# Patient Record
Sex: Female | Born: 1955
Health system: Southern US, Community
[De-identification: ages and names within clinical notes are randomized; demographics above are authoritative.]

## PROBLEM LIST (undated history)

## (undated) DIAGNOSIS — I10 Essential (primary) hypertension: Secondary | ICD-10-CM

## (undated) DIAGNOSIS — L309 Dermatitis, unspecified: Secondary | ICD-10-CM

## (undated) DIAGNOSIS — J45909 Unspecified asthma, uncomplicated: Secondary | ICD-10-CM

## (undated) HISTORY — DX: Unspecified asthma, uncomplicated: J45.909

## (undated) HISTORY — DX: Essential (primary) hypertension: I10

---

## 1997-07-10 ENCOUNTER — Emergency Department (HOSPITAL_COMMUNITY): Admission: EM | Admit: 1997-07-10 | Discharge: 1997-07-10 | Payer: Self-pay | Admitting: Emergency Medicine

## 1998-03-29 ENCOUNTER — Emergency Department (HOSPITAL_COMMUNITY): Admission: EM | Admit: 1998-03-29 | Discharge: 1998-03-29 | Payer: Self-pay | Admitting: Emergency Medicine

## 1999-12-08 ENCOUNTER — Emergency Department (HOSPITAL_COMMUNITY): Admission: EM | Admit: 1999-12-08 | Discharge: 1999-12-09 | Payer: Self-pay | Admitting: Emergency Medicine

## 1999-12-08 ENCOUNTER — Encounter: Payer: Self-pay | Admitting: Emergency Medicine

## 2000-07-06 ENCOUNTER — Emergency Department (HOSPITAL_COMMUNITY): Admission: EM | Admit: 2000-07-06 | Discharge: 2000-07-06 | Payer: Self-pay | Admitting: Emergency Medicine

## 2000-07-06 ENCOUNTER — Encounter: Payer: Self-pay | Admitting: Emergency Medicine

## 2000-08-14 ENCOUNTER — Encounter: Payer: Self-pay | Admitting: Emergency Medicine

## 2000-08-14 ENCOUNTER — Emergency Department (HOSPITAL_COMMUNITY): Admission: EM | Admit: 2000-08-14 | Discharge: 2000-08-14 | Payer: Self-pay | Admitting: Emergency Medicine

## 2001-03-28 ENCOUNTER — Emergency Department (HOSPITAL_COMMUNITY): Admission: EM | Admit: 2001-03-28 | Discharge: 2001-03-28 | Payer: Self-pay | Admitting: Emergency Medicine

## 2001-03-28 ENCOUNTER — Encounter: Payer: Self-pay | Admitting: Emergency Medicine

## 2001-05-22 ENCOUNTER — Encounter: Payer: Self-pay | Admitting: Emergency Medicine

## 2001-05-22 ENCOUNTER — Encounter: Payer: Self-pay | Admitting: Orthopedic Surgery

## 2001-05-22 ENCOUNTER — Emergency Department (HOSPITAL_COMMUNITY): Admission: EM | Admit: 2001-05-22 | Discharge: 2001-05-22 | Payer: Self-pay | Admitting: Emergency Medicine

## 2001-06-13 ENCOUNTER — Emergency Department (HOSPITAL_COMMUNITY): Admission: EM | Admit: 2001-06-13 | Discharge: 2001-06-13 | Payer: Self-pay

## 2001-07-28 ENCOUNTER — Emergency Department (HOSPITAL_COMMUNITY): Admission: EM | Admit: 2001-07-28 | Discharge: 2001-07-28 | Payer: Self-pay | Admitting: Emergency Medicine

## 2002-12-19 ENCOUNTER — Emergency Department (HOSPITAL_COMMUNITY): Admission: EM | Admit: 2002-12-19 | Discharge: 2002-12-19 | Payer: Self-pay | Admitting: Emergency Medicine

## 2003-02-04 ENCOUNTER — Emergency Department (HOSPITAL_COMMUNITY): Admission: EM | Admit: 2003-02-04 | Discharge: 2003-02-04 | Payer: Self-pay | Admitting: Emergency Medicine

## 2003-04-11 ENCOUNTER — Emergency Department (HOSPITAL_COMMUNITY): Admission: EM | Admit: 2003-04-11 | Discharge: 2003-04-11 | Payer: Self-pay

## 2003-05-29 ENCOUNTER — Emergency Department (HOSPITAL_COMMUNITY): Admission: EM | Admit: 2003-05-29 | Discharge: 2003-05-29 | Payer: Self-pay | Admitting: Emergency Medicine

## 2003-10-29 ENCOUNTER — Emergency Department (HOSPITAL_COMMUNITY): Admission: EM | Admit: 2003-10-29 | Discharge: 2003-10-29 | Payer: Self-pay | Admitting: Emergency Medicine

## 2003-12-08 ENCOUNTER — Emergency Department (HOSPITAL_COMMUNITY): Admission: EM | Admit: 2003-12-08 | Discharge: 2003-12-08 | Payer: Self-pay | Admitting: Emergency Medicine

## 2003-12-10 ENCOUNTER — Emergency Department (HOSPITAL_COMMUNITY): Admission: EM | Admit: 2003-12-10 | Discharge: 2003-12-10 | Payer: Self-pay | Admitting: Emergency Medicine

## 2004-06-07 ENCOUNTER — Emergency Department (HOSPITAL_COMMUNITY): Admission: EM | Admit: 2004-06-07 | Discharge: 2004-06-07 | Payer: Self-pay | Admitting: Emergency Medicine

## 2004-12-15 ENCOUNTER — Emergency Department (HOSPITAL_COMMUNITY): Admission: EM | Admit: 2004-12-15 | Discharge: 2004-12-15 | Payer: Self-pay | Admitting: Emergency Medicine

## 2005-04-26 ENCOUNTER — Emergency Department (HOSPITAL_COMMUNITY): Admission: EM | Admit: 2005-04-26 | Discharge: 2005-04-26 | Payer: Self-pay | Admitting: Emergency Medicine

## 2007-02-21 ENCOUNTER — Emergency Department (HOSPITAL_COMMUNITY): Admission: EM | Admit: 2007-02-21 | Discharge: 2007-02-21 | Payer: Self-pay | Admitting: Emergency Medicine

## 2008-11-17 ENCOUNTER — Emergency Department (HOSPITAL_COMMUNITY): Admission: EM | Admit: 2008-11-17 | Discharge: 2008-11-17 | Payer: Self-pay | Admitting: Emergency Medicine

## 2008-11-29 ENCOUNTER — Inpatient Hospital Stay (HOSPITAL_COMMUNITY): Admission: EM | Admit: 2008-11-29 | Discharge: 2008-12-07 | Payer: Self-pay | Admitting: Emergency Medicine

## 2008-11-30 ENCOUNTER — Ambulatory Visit: Payer: Self-pay | Admitting: Surgery

## 2008-11-30 ENCOUNTER — Encounter (INDEPENDENT_AMBULATORY_CARE_PROVIDER_SITE_OTHER): Payer: Self-pay | Admitting: Internal Medicine

## 2009-01-10 ENCOUNTER — Ambulatory Visit: Payer: Self-pay | Admitting: Internal Medicine

## 2009-01-10 DIAGNOSIS — Z87898 Personal history of other specified conditions: Secondary | ICD-10-CM

## 2009-01-10 DIAGNOSIS — L209 Atopic dermatitis, unspecified: Secondary | ICD-10-CM

## 2009-01-10 DIAGNOSIS — L03119 Cellulitis of unspecified part of limb: Secondary | ICD-10-CM

## 2009-01-10 DIAGNOSIS — J45909 Unspecified asthma, uncomplicated: Secondary | ICD-10-CM | POA: Insufficient documentation

## 2009-01-10 DIAGNOSIS — I10 Essential (primary) hypertension: Secondary | ICD-10-CM

## 2009-01-10 DIAGNOSIS — L02419 Cutaneous abscess of limb, unspecified: Secondary | ICD-10-CM | POA: Insufficient documentation

## 2009-01-31 ENCOUNTER — Ambulatory Visit: Payer: Self-pay | Admitting: Internal Medicine

## 2009-02-28 ENCOUNTER — Ambulatory Visit: Payer: Self-pay | Admitting: Internal Medicine

## 2009-03-01 ENCOUNTER — Telehealth (INDEPENDENT_AMBULATORY_CARE_PROVIDER_SITE_OTHER): Payer: Self-pay | Admitting: Internal Medicine

## 2009-06-28 ENCOUNTER — Ambulatory Visit: Payer: Self-pay | Admitting: Internal Medicine

## 2009-06-28 DIAGNOSIS — J309 Allergic rhinitis, unspecified: Secondary | ICD-10-CM | POA: Insufficient documentation

## 2009-06-28 DIAGNOSIS — J4489 Other specified chronic obstructive pulmonary disease: Secondary | ICD-10-CM | POA: Insufficient documentation

## 2009-06-28 DIAGNOSIS — J449 Chronic obstructive pulmonary disease, unspecified: Secondary | ICD-10-CM

## 2009-06-28 DIAGNOSIS — F17200 Nicotine dependence, unspecified, uncomplicated: Secondary | ICD-10-CM

## 2009-06-28 LAB — CONVERTED CEMR LAB
Chlamydia, DNA Probe: NEGATIVE
GC Probe Amp, Genital: NEGATIVE
Glucose, Urine, Semiquant: NEGATIVE
KOH Prep: NEGATIVE
Nitrite: POSITIVE
pH: 5

## 2009-06-29 ENCOUNTER — Telehealth (INDEPENDENT_AMBULATORY_CARE_PROVIDER_SITE_OTHER): Payer: Self-pay | Admitting: Internal Medicine

## 2009-07-02 ENCOUNTER — Telehealth (INDEPENDENT_AMBULATORY_CARE_PROVIDER_SITE_OTHER): Payer: Self-pay | Admitting: Internal Medicine

## 2009-07-05 ENCOUNTER — Encounter (INDEPENDENT_AMBULATORY_CARE_PROVIDER_SITE_OTHER): Payer: Self-pay | Admitting: Internal Medicine

## 2009-07-08 ENCOUNTER — Ambulatory Visit (HOSPITAL_COMMUNITY): Admission: RE | Admit: 2009-07-08 | Discharge: 2009-07-08 | Payer: Self-pay | Admitting: Internal Medicine

## 2009-07-16 ENCOUNTER — Ambulatory Visit: Payer: Self-pay | Admitting: Internal Medicine

## 2009-07-16 LAB — CONVERTED CEMR LAB
ALT: 19 units/L (ref 0–35)
Albumin: 4.3 g/dL (ref 3.5–5.2)
Alkaline Phosphatase: 55 units/L (ref 39–117)
BUN: 13 mg/dL (ref 6–23)
Basophils Absolute: 0 10*3/uL (ref 0.0–0.1)
Basophils Relative: 0 % (ref 0–1)
Chloride: 101 meq/L (ref 96–112)
Creatinine, Ser: 0.65 mg/dL (ref 0.40–1.20)
Eosinophils Absolute: 0.1 10*3/uL (ref 0.0–0.7)
Eosinophils Relative: 2 % (ref 0–5)
HCT: 41.2 % (ref 36.0–46.0)
Hemoglobin: 14 g/dL (ref 12.0–15.0)
LDL Cholesterol: 96 mg/dL (ref 0–99)
Lymphocytes Relative: 36 % (ref 12–46)
Lymphs Abs: 1.8 10*3/uL (ref 0.7–4.0)
Monocytes Absolute: 0.9 10*3/uL (ref 0.1–1.0)
Monocytes Relative: 17 % — ABNORMAL HIGH (ref 3–12)
Neutro Abs: 2.3 10*3/uL (ref 1.7–7.7)
Platelets: 223 10*3/uL (ref 150–400)
RBC: 4.24 M/uL (ref 3.87–5.11)
RDW: 15.4 % (ref 11.5–15.5)
Total CHOL/HDL Ratio: 2.1
WBC: 5.1 10*3/uL (ref 4.0–10.5)

## 2009-07-24 ENCOUNTER — Encounter (INDEPENDENT_AMBULATORY_CARE_PROVIDER_SITE_OTHER): Payer: Self-pay | Admitting: Internal Medicine

## 2010-03-04 NOTE — Letter (Signed)
Summary: Lipid Letter  [facility]  [address] [place], [place] [address]   Phone: [phone]  Fax: [phone]    07/24/2009  Marie Woods [address] [place], [place]  [address]  Dear [name]:  We have carefully reviewed your last lipid profile from 07/16/2009 and the results are noted below with a summary of recommendations for lipid management.    Cholesterol:       205     Goal: <200   HDL "good" Cholesterol:   96     Goal: >45   LDL "bad" Cholesterol:   96     Goal: <100   Triglycerides:       65     Goal: <150    Your cholesterol is excellent--the total is a bit over 200, but this is because your good cholesterol is so incredibly high.  Rest of labs were fine as well.    TLC Diet (Therapeutic Lifestyle Change): Saturated Fats & Transfatty acids should be kept < 7% of total calories ***Reduce Saturated Fats Polyunstaurated Fat can be up to 10% of total calories Monounsaturated Fat Fat can be up to 20% of total calories Total Fat should be no greater than 25-35% of total calories Carbohydrates should be 50-60% of total calories Protein should be approximately 15% of total calories Fiber should be at least 20-30 grams a day ***Increased fiber may help lower LDL Total Cholesterol should be < 200mg /day Consider adding plant stanol/sterols to diet (example: Benacol spread) ***A higher intake of unsaturated fat may reduce Triglycerides and Increase HDL    Adjunctive Measures (may lower LIPIDS and reduce risk of Heart Attack) include: Aerobic Exercise (20-30 minutes 3-4 times a week) Limit Alcohol Consumption Weight Reduction Aspirin 75-81 mg a day by mouth (if not allergic or contraindicated) Dietary Fiber 20-30 grams a day by mouth     Current Medications: 1)    Ventolin Hfa 108 (90 Base) Mcg/act Aers (Albuterol sulfate) .... 2 puffs 2-3 times a day 2)    Amlodipine Besylate 10 Mg Tabs (Amlodipine besylate) .[name].. 1 tab by mouth daily 3)    Clobetasol  Propionate 0.05 % Crea (Clobetasol propionate) .... Apply to patches two times a day as needed 4)    Xyzal 5 Mg Tabs (Levocetirizine dihydrochloride) .[name].. 1 tab by mouth daily as needed allergies 5)    Nasacort Aq 55 Mcg/act Aers (Triamcinolone acetonide(nasal)) .... 2 sprays each nostril daily 6)    Advair Diskus 100-50 Mcg/dose Aepb (Fluticasone-salmeterol) .[name].. 1 inhalation two times a day.  brush teeth and tongue after each use 7)    Zoloft 50 Mg Tabs (Sertraline hcl) .... 1/2 tab by mouth daily for 7 days, then 1 tab daily thereafter.  If you have any questions, please call. We appreciate being able to work with you.   Sincerely,    [facility] Julieanne Manson MD

## 2010-03-04 NOTE — Progress Notes (Signed)
Summary: change med  Phone Note From Pharmacy   Summary of Call: Cathrine Muster from [facility] pharmacy called, we can no longer enroll new people into the ICP program.  What would you like to change Ms. Trimmer to? Initial call taken by: Vesta Mixer CMA,  Jul 02, 2009 3:54 PM  Follow-up for Phone Call        Called and spoke with Abelina Bachelor other form of Bupropion/Wellbutrin available  Follow-up by: Julieanne Manson MD,  Jul 02, 2009 6:35 PM  Additional Follow-up for Phone Call Additional follow up Details #1::        Called and spoke with pt.--do not want to start her on Chantix with some of her depressive features.  Has never been on an antidepressant even when suicidal back in 1984. Will fax in Zoloft and gave her the instructions To call and set up an appt. with me when she picks up med --to be seen 4 weeks later. Once depression controlled, consider Chantix Additional Follow-up by: Julieanne Manson MD,  July 03, 2009 9:39 AM    New/Updated Medications: ZOLOFT 50 MG TABS (SERTRALINE HCL) 1/2 tab by mouth daily for 7 days, then 1 tab daily thereafter. Prescriptions: ZOLOFT 50 MG TABS (SERTRALINE HCL) 1/2 tab by mouth daily for 7 days, then 1 tab daily thereafter.  #30 x 2   Entered and Authorized by:   Julieanne Manson MD   Signed by:   Julieanne Manson MD on 07/03/2009   Method used:   Faxed to ...       [facility] - Pharmac (retail)       [address] [place], [place]  [address]       Ph: [phone]       Fax: [phone]   RxID:   [id number]

## 2010-03-04 NOTE — Progress Notes (Signed)
Summary: Refills  Phone Note Call from Patient Call back at [facility] Phone [phone] Call back at [phone]   Summary of Call: According to our records, shows that the pt has refills from amilodipine but when she called to [facility] they said that she doesn't has more refills.  Pt is wondering if someone can call at there and she also needs refills from ventolin. Pt blood pressure is high and needs refills.  Please call her back.   Maycee Blasco MD Initial call taken by: Manon Hilding,  March 01, 2009 9:06 AM  Follow-up for Phone Call        Called pharmacy to verify pt's 11 refills on Norvasc.  Pt aware.  Pt is not having any sob right now, just wants to have one on hand just in case of an asthma attack. Follow-up by: Vesta Mixer CMA,  March 04, 2009 9:10 AM  Additional Follow-up for Phone Call Additional follow up Details #1::        Notify pt. Rx sent Additional Follow-up by: Julieanne Manson MD,  March 04, 2009 2:09 PM    Prescriptions: VENTOLIN HFA 108 (90 BASE) MCG/ACT AERS (ALBUTEROL SULFATE) 2 puffs 2-3 times a day  #1 x 0   Entered and Authorized by:   Julieanne Manson MD   Signed by:   Julieanne Manson MD on 03/04/2009   Method used:   Faxed to ...       [facility] - Pharmac (retail)       [address] [place], [place]  [address]       Ph: [phone]       Fax: [phone]   RxID:   [id number]

## 2010-03-04 NOTE — Progress Notes (Signed)
Summary: Office Visit//DEPRESSION SCREENING  Office Visit//DEPRESSION SCREENING   Imported By: Arta Bruce 08/12/2009 14:36:00  _____________________________________________________________________  External Attachment:    Type:   Image     Comment:   External Document

## 2010-03-04 NOTE — Letter (Signed)
Summary: PT INFORMATION SHEET  PT INFORMATION SHEET   Imported By: Arta Bruce 02/25/2009 09:48:06  _____________________________________________________________________  External Attachment:    Type:   Image     Comment:   External Document

## 2010-03-04 NOTE — Letter (Signed)
Summary: *HSN Results Follow up  [facility]  [address] [place], [place] [address]   Phone: [phone]  Fax: [phone]      07/05/2009   Marie Woods [address] [place], [place]  [address]   Dear  Ms. Logan Valcarcel,                            ____S.Drinkard,FNP   ____D. Gore,FNP       ____B. McPherson,MD   ____V. Rankins,MD    __X__E. Karyna Bessler,MD    ____N. Daphine Deutscher, FNP  ____D. Reche Dixon, MD    ____K. Philipp Deputy, MD    ____Other     This letter is to inform you that your recent test(s):  ___X____Pap Smear    _______Lab Test     _______X-ray    ____X___ is within acceptable limits  _______ requires a medication change  _______ requires a follow-up lab visit  _______ requires a follow-up visit with your provider   Comments:       _________________________________________________________ If you have any questions, please contact our office                     Sincerely,  Julieanne Manson MD [facility]

## 2010-03-04 NOTE — Assessment & Plan Note (Signed)
Summary: 6 WEEK F/U ECZEMA///BC   Vital Signs:  Patient profile:   55 year old female Weight:      187.1 pounds Temp:     96.2 degrees F oral Pulse rate:   84 / minute Pulse rhythm:   regular Resp:     18 per minute BP sitting:   136 / 83  (left arm) Cuff size:   regular  Vitals Entered By: Geanie Cooley (February 28, 2009 4:09 PM) CC: pt here for a followup on her Eczema Is Patient Diabetic? No Pain Assessment Patient in pain? no       Does patient need assistance? Functional Status Self care Ambulation Normal   CC:  pt here for a followup on her Eczema.  History of Present Illness: 1.  Hypertension:  Pt. never filled the 10 mg Amlodipine tabs--out of work and sister died recently.  Is taking the 5 mg tabs once daily.  2.  Eczema:  Really improved.  Eyes remain quite pruritic.  Lids mainly, but eyes also water.  Does intermittently have problems with sneezing.  Allergies (verified): No Known Drug Allergies  Family History: Mother, died 19:  Breast cancer, hypertension Father, died 79:  brain aneurysm rupture, COPD--smoker, hypertension 1 Brother 1/2 Brother 1 Sister:  Lung cancer --smoker--died recently, age 47 3 1/2 Sisters:  one with htn. No children  Physical Exam  General:  NAD Lungs:  Normal respiratory effort, chest expands symmetrically. Lungs are clear to auscultation, no crackles or wheezes. Heart:  Normal rate and regular rhythm. S1 and S2 normal without gallop, murmur, click, rub or other extra sounds.  Radial pulses normal and equal Skin:  Thickened, hyperpigmented skin folds of eyelids with mild flaking Minimal lichenification left about umbilicus as well as right lower leg eczema. Two one cm areas of flaking and thickening on left low back.   Impression & Recommendations:  Problem # 1:  ESSENTIAL HYPERTENSION (ICD-401.9) To increase Amlodipine as discussed at last nurse visit--pt. feels she can do this shortly. Her updated  medication list for this problem includes:    Amlodipine Besylate 10 Mg Tabs (Amlodipine besylate) .[name]... 1 tab by mouth daily  Problem # 2:  ECZEMA (ICD-692.9) Much improved Her updated medication list for this problem includes:    Clobetasol Propionate 0.05 % Crea (Clobetasol propionate) .[name]... Apply to patches two times a day as needed    Fexofenadine Hcl 180 Mg Tabs (Fexofenadine hcl) .[name]... 1 tab by mouth daily  Complete Medication List: 1)  Ventolin Hfa 108 (90 Base) Mcg/act Aers (Albuterol sulfate) .... 2 puffs 2-3 times a day 2)  Amlodipine Besylate 10 Mg Tabs (Amlodipine besylate) .[name].. 1 tab by mouth daily 3)  Clobetasol Propionate 0.05 % Crea (Clobetasol propionate) .... Apply to patches two times a day as needed 4)  Fexofenadine Hcl 180 Mg Tabs (Fexofenadine hcl) .[name].. 1 tab by mouth daily  Patient Instructions: 1)  Nurse visit for bp check in 2 weeks. 2)  CPP with Dr. Delrae Alfred in next 4 months 3)  Use Dove soap, not Dial. Prescriptions: FEXOFENADINE HCL 180 MG TABS (FEXOFENADINE HCL) 1 tab by mouth daily  #30 x 11   Entered and Authorized by:   Julieanne Manson MD   Signed by:   Julieanne Manson MD on 02/28/2009   Method used:   Faxed to ...       [facility] - Pharmac (retail)       [address]  [place], [place]  [address]       Ph: [phone]       Fax: [phone]   RxID:   [id number] CLOBETASOL PROPIONATE 0.05 % CREA (CLOBETASOL PROPIONATE) apply to patches two times a day as needed  #60g x 4   Entered and Authorized by:   Julieanne Manson MD   Signed by:   Julieanne Manson MD on 02/28/2009   Method used:   Faxed to ...       [facility] - Pharmac (retail)       [address] [place], [place]  [address]       Ph: [phone]       Fax: [phone]   RxID:   [id number]    Vital Signs:  Patient profile:   55 year old female Weight:      187.1 pounds Temp:     96.2  degrees F oral Pulse rate:   84 / minute Pulse rhythm:   regular Resp:     18 per minute BP sitting:   136 / 83  (left arm) Cuff size:   regular  Vitals Entered By: Geanie Cooley (February 28, 2009 4:09 PM)   Serial Vital Signs/Assessments:  Time      Position  BP       Pulse  Resp  Temp     By                     170/108                        Julieanne Manson MD

## 2010-03-04 NOTE — Assessment & Plan Note (Signed)
Summary: 4 MONTH FU FOR CPP/////KT   Vital Signs:  Patient profile:   55 year old female Height:      66.25 inches Weight:      187 pounds BMI:     30.06 Temp:     97.8 degrees F oral Pulse rate:   81 / minute Pulse rhythm:   regular Resp:     18 per minute BP sitting:   112 / 76  (left arm) Cuff size:   regular  Vitals Entered By: Arm[place] Shannon (Jun 28, 2009 4:00 PM) CC: CPP Is Patient Diabetic? No Pain Assessment Patient in pain? no       Does patient need assistance? Functional Status Self care Ambulation Normal   CC:  CPP.  History of Present Illness: 55 yo female here for CPP.  Current Medications (verified): 1)  Ventolin Hfa 108 (90 Base) Mcg/act Aers (Albuterol Sulfate) .... 2 Puffs 2-3 Times A Day 2)  Amlodipine Besylate 10 Mg Tabs (Amlodipine Besylate) .[name].. 1 Tab By Mouth Daily 3)  Clobetasol Propionate 0.05 % Crea (Clobetasol Propionate) .... Apply To Patches Two Times A Day As Needed 4)  Fexofenadine Hcl 180 Mg Tabs (Fexofenadine Hcl) .[name].. 1 Tab By Mouth Daily  Allergies (verified): No Known Drug Allergies  Past History:  Past Medical History: ALCOHOLISM (ICD-303.90) ESSENTIAL HYPERTENSION (ICD-401.9) ECZEMA (ICD-692.9) CELLULITIS, LEG, RIGHT (ICD-682.6) ASTHMA, UNSPECIFIED, UNSPECIFIED STATUS (ICD-493.90)  Past Surgical History: None  Family History: Mother, died 15:  Breast cancer, hypertension Father, died 62:  brain aneurysm rupture, COPD--smoker, hypertension 1 Brother:  Healthy 1/2 Brother:  Healthy 1 Sister:  Lung cancer --smoker--died recently, age 19 3 1/2 Sisters:  one with htn.  otherwise healthy No children  Social History: Lives alone in an apt. in [place]. Never married. Works as a Science writer for [employer]   Siblings live in area. Alcohol:  Couple of beers 3 times weekly after work Tobacco:  started as a teenager.  Smokes 1/2 ppd Drugs:  Hx of MJ use--years ago.  Review of Systems General:  Energy generally  good.. Eyes:  Vision is 20/80 per pt--needs glasses.  Does not use reading glasses.  Has not tried [employer] for vision.. ENT:  Denies decreased hearing. CV:  Denies chest pain or discomfort and palpitations. Resp:  SOB only with asthma attack--last was 3-4 months ago.  Went to [employer].. GI:  Denies bloody stools, constipation, dark tarry stools, and diarrhea. GU:  Denies discharge, dysuria, and urinary frequency. MS:  Denies joint pain, joint redness, and joint swelling. Derm:  Denies lesion(s) and rash. Neuro:  Denies numbness, tingling, and weakness. Psych:  Has had suicidal thoughts in past--1984.  Enjoys some parts of life.  Scored a 6 on PQH 9.  Stressed with job at times..  Physical Exam  General:  Obese, congested cough and sniffling thoughout interview. Head:  Normocephalic and atraumatic without obvious abnormalities. No apparent alopecia or balding. Eyes:  No corneal or conjunctival inflammation noted. EOMI. Perrla. Funduscopic exam benign, without hemorrhages, exudates or papilledema. Vision grossly normal. Ears:  External ear exam shows no significant lesions or deformities.  Otoscopic examination reveals clear canals, tympanic membranes are intact bilaterally without bulging, retraction, inflammation or discharge. Hearing is grossly normal bilaterally. Nose:  Mucosal edema and clear discharge. Mouth:  poor dentition and teeth missing.  Throat without injection or exudates Neck:  No deformities, masses, or tenderness noted. Chest Wall:  No deformities, masses, or tenderness noted. Breasts:  No mass, nodules, thickening,  tenderness, bulging, retraction, inflamation, nipple discharge or skin changes noted.   Lungs:  Decreased BS throughout, though no crackles or wheeze. Heart:  Normal rate and regular rhythm. S1 and S2 normal without gallop, murmur, click, rub or other extra sounds. Abdomen:  Bowel sounds positive,abdomen soft and non-tender without masses, organomegaly or  hernias noted. Rectal:  No external abnormalities noted. Normal sphincter tone. No rectal masses or tenderness.  Heme negative light brown stool. Genitalia:  Pelvic Exam:        External: normal female genitalia without lesions or masses        Vagina: normal without lesions or masses        Cervix: normal without lesions or masses        Adnexa: normal bimanual exam without masses or fullness        Uterus: normal by palpation        Pap smear: performed Msk:  No deformity or scoliosis noted of thoracic or lumbar spine.   Pulses:  R and L carotid,radial,femoral,dorsalis pedis and posterior tibial pulses are full and equal bilaterally Extremities:  No clubbing, cyanosis, edema, or deformity noted with normal full range of motion of all joints.   Neurologic:  No cranial nerve deficits noted. Station and gait are normal. Plantar reflexes are down-going bilaterally. DTRs are symmetrical throughout. Sensory, motor and coordinative functions appear intact. Skin:  Intact without suspicious lesions or rashes Cervical Nodes:  No lymphadenopathy noted Axillary Nodes:  No palpable lymphadenopathy Inguinal Nodes:  No significant adenopathy Psych:  Cognition and judgment appear intact. Alert and cooperative with normal attention span and concentration. No apparent delusions, illusions, hallucinations   Impression & Recommendations:  Problem # 1:  ROUTINE GYNECOLOGICAL EXAMINATION (ICD-V72.31) Mammogram scheduled Orders: KOH/ WET Mount [phone]) UA Dipstick w/o Micro (manual) (23762) Pap Smear, Thin Prep ( Collection of) (Q0091) T- GC Chlamydia (83[address])  Problem # 2:  HEALTH SCREENING (ICD-V70.0)  Discuss Pneumococcal vaccination at follow up Hemoccult cards x3 to return in 2 weeks.  Orders: Gastroenterology Referral (GI)--screening colonoscopy  Problem # 3:  COPD (ICD-496)  Her updated medication list for this problem includes:    Ventolin Hfa 108 (90 Base) Mcg/act Aers (Albuterol sulfate)  .[name]... 2 puffs 2-3 times a day    Advair Diskus 100-50 Mcg/dose Aepb (Fluticasone-salmeterol) .[name]... 1 inhalation two times a day.  brush teeth and tongue after each use  Problem # 4:  ALLERGIC RHINITIS (ICD-477.9) Switch to covered Xyzal. Add nasal corticosteroids The following medications were removed from the medication list:    Fexofenadine Hcl 180 Mg Tabs (Fexofenadine hcl) .[name]... 1 tab by mouth daily Her updated medication list for this problem includes:    Xyzal 5 Mg Tabs (Levocetirizine dihydrochloride) .[name]... 1 tab by mouth daily as needed allergies    Nasacort Aq 55 Mcg/act Aers (Triamcinolone acetonide(nasal)) .[name]... 2 sprays each nostril daily  Problem # 5:  TOBACCO ABUSE (ICD-305.1) Start Wellbutrin--feel she has depression this may aid as well.  Complete Medication List: 1)  Ventolin Hfa 108 (90 Base) Mcg/act Aers (Albuterol sulfate) .... 2 puffs 2-3 times a day 2)  Amlodipine Besylate 10 Mg Tabs (Amlodipine besylate) .[name].. 1 tab by mouth daily 3)  Clobetasol Propionate 0.05 % Crea (Clobetasol propionate) .... Apply to patches two times a day as needed 4)  Xyzal 5 Mg Tabs (Levocetirizine dihydrochloride) .[name].. 1 tab by mouth daily as needed allergies 5)  Nasacort Aq 55 Mcg/act Aers (Triamcinolone acetonide(nasal)) .... 2 sprays each nostril daily 6)  Advair Diskus 100-50 Mcg/dose Aepb (Fluticasone-salmeterol) .[name].. 1 inhalation two times a day.  brush teeth and tongue after each use 7)  Wellbutrin Sr 150 Mg Xr12h-tab (Bupropion hcl) .[name].. 1 tab by mouth in a.m. daily for 3 days, then 1 tab by mouth two times a day  Patient Instructions: 1)  Fasting labs in next 2 weeks:  CMET, CBC, FLP, HIV, RPR--bring in stool cards as well then. 2)  Stop smoking on Day 8 to 14 of taking Wellbutrin 3)  Make a follow up appt. with Dr. Delrae Alfred in 4 weeks after starting Wellbutrin--can make that appt. when in for labs. Prescriptions: WELLBUTRIN SR 150 MG XR12H-TAB (BUPROPION HCL) 1 tab by mouth in  a.m. daily for 3 days, then 1 tab by mouth two times a day  #60 x 2   Entered and Authorized by:   Julieanne Manson MD   Signed by:   Julieanne Manson MD on 06/29/2009   Method used:   Faxed to ...       [facility] - Pharmac (retail)       [address] [place], [place]  [address]       Ph: [phone]       Fax: [phone]   RxID:   [id number] AMLODIPINE BESYLATE 10 MG TABS (AMLODIPINE BESYLATE) 1 tab by mouth daily  #30 x 11   Entered and Authorized by:   Julieanne Manson MD   Signed by:   Julieanne Manson MD on 06/29/2009   Method used:   Faxed to ...       [facility] - Pharmac (retail)       [address] [place], [place]  [address]       Ph: [phone]       Fax: [phone]   RxID:   [id number] VENTOLIN HFA 108 (90 BASE) MCG/ACT AERS (ALBUTEROL SULFATE) 2 puffs 2-3 times a day  #1 x 0   Entered and Authorized by:   Julieanne Manson MD   Signed by:   Julieanne Manson MD on 06/29/2009   Method used:   Faxed to ...       [facility] - Pharmac (retail)       [address] [place], [place]  [address]       Ph: [phone]       Fax: [phone]   RxID:   [id number] ADVAIR DISKUS 100-50 MCG/DOSE AEPB (FLUTICASONE-SALMETEROL) 1 inhalation two times a day.  Brush teeth and tongue after each use  #1 x 11   Entered and Authorized by:   Julieanne Manson MD   Signed by:   Julieanne Manson MD on 06/29/2009   Method used:   Faxed to ...       [facility] - Pharmac (retail)       [address] [place], [place]  [address]       Ph: [phone]       Fax: [phone]   RxID:   [id number] NASACORT AQ 55 MCG/ACT AERS (TRIAMCINOLONE ACETONIDE(NASAL)) 2 sprays each nostril daily  #1 x 11   Entered and Authorized by:   Julieanne Manson MD   Signed by:   Julieanne Manson MD on 06/29/2009   Method  used:   Faxed to ...       [facility] - Pharmac (retail)       [address] [place], [place]  [address]       Ph: [phone]       Fax: [phone]   RxID:   [id number] XYZAL 5 MG TABS (LEVOCETIRIZINE DIHYDROCHLORIDE) 1 tab by mouth daily as needed allergies  #30 x 11   Entered and Authorized by:   Julieanne Manson MD   Signed by:   Julieanne Manson MD on 06/28/2009   Method used:   Faxed to ...       [facility] - Pharmac (retail)       [address] [place], [place]  [address]       Ph: [phone]       Fax: [phone]   RxID:   [id number]    Preventive Care Screening     SBE:  Once weekly--no changes Mammogram:  never Last Pap: cannot remember--never abnormal. Osteoprevention:  Occasional cheese.  Feels she is lactose intolerant and does not like Lactaid.   Guaiac Cards:  never. Colonoscopy: never.  Laboratory Results   Urine Tests    Routine Urinalysis   Glucose: negative   (Normal Range: Negative) Bilirubin: small   (Normal Range: Negative) Ketone: small (15)   (Normal Range: Negative) Spec. Gravity: >=1.030   (Normal Range: 1.003-1.035) Blood: small   (Normal Range: Negative) pH: 5.0   (Normal Range: 5.0-8.0) Protein: trace   (Normal Range: Negative) Urobilinogen: 0.2   (Normal Range: 0-1) Nitrite: positive   (Normal Range: Negative) Leukocyte Esterace: negative   (Normal Range: Negative)      Wet Mount Source: vaginal WBC/hpf: 1-5 Bacteria/hpf: 2+ Clue cells/hpf: few  Positive whiff Yeast/hpf: none Wet Mount KOH: Negative Trichomonas/hpf: none   Laboratory Results   Urine Tests    Routine Urinalysis   Glucose: negative   (Normal Range: Negative) Bilirubin: small   (Normal Range: Negative) Ketone: small (15)   (Normal Range: Negative) Spec. Gravity: >=1.030   (Normal Range: 1.003-1.035) Blood: small   (Normal Range: Negative) pH: 5.0   (Normal  Range: 5.0-8.0) Protein: trace   (Normal Range: Negative) Urobilinogen: 0.2   (Normal Range: 0-1) Nitrite: positive   (Normal Range: Negative) Leukocyte Esterace: negative   (Normal Range: Negative)      Wet Mount/KOH  Positive whiff

## 2010-03-04 NOTE — Progress Notes (Signed)
  Phone Note Outgoing Call   Call placed by: Julieanne Manson MD,  Jun 29, 2009 12:11 PM Summary of Call: Called pt. and left message to call office on Tuesday--need to speak with her regarding Wellbutrin for smoking cessation. Initial call taken by: Julieanne Manson MD,  Jun 29, 2009 12:12 PM  Follow-up for Phone Call        Called later and gave info regarding Wellbutrin to pt.  Not sure how much she retained as she sounded under the influence of unknown substance.   Follow-up by: Julieanne Manson MD,  Jun 29, 2009 5:29 PM

## 2010-04-03 ENCOUNTER — Encounter (INDEPENDENT_AMBULATORY_CARE_PROVIDER_SITE_OTHER): Payer: Self-pay | Admitting: Internal Medicine

## 2010-04-03 ENCOUNTER — Encounter: Payer: Self-pay | Admitting: Internal Medicine

## 2010-04-03 DIAGNOSIS — F329 Major depressive disorder, single episode, unspecified: Secondary | ICD-10-CM | POA: Insufficient documentation

## 2010-04-03 DIAGNOSIS — J069 Acute upper respiratory infection, unspecified: Secondary | ICD-10-CM | POA: Insufficient documentation

## 2010-04-10 NOTE — Assessment & Plan Note (Addendum)
Summary: Cold x3 days   Vital Signs:  Patient profile:   55 year old female Menstrual status:  postmenopausal Weight:      195.31 pounds BMI:     31.40 O2 Sat:      98 % on Room air Temp:     98.3 degrees F oral Pulse rate:   100 / minute Pulse rhythm:   regular Resp:     18 per minute BP sitting:   130 / 89  (left arm) Cuff size:   regular  Vitals Entered By: Hale Drone CMA (April 03, 2010 12:08 PM)  O2 Flow:  Room air CC: Cold x3 days -- Having a sore throat, cough, and coughing white fleghm. -- Denies fevers, vomiting, and diarrhea.  Is Patient Diabetic? No Pain Assessment Patient in pain? no       Does patient need assistance? Functional Status Self care Ambulation Normal     Menstrual Status postmenopausal Last PAP Result NEGATIVE FOR INTRAEPITHELIAL LESIONS OR MALIGNANCY.   Serial Vital Signs/Assessments:                                PEF    PreRx  PostRx Time      O2 Sat  O2 Type     L/min  L/min  L/min   By 12:09 PM  98  %   Room air                          Hale Drone CMA  Comments: 12:09 PM Peak Flow: 230 -230 - 260  By: Hale Drone CMA    CC:  Cold x3 days -- Having a sore throat, cough, and coughing white fleghm. -- Denies fevers, vomiting, and and diarrhea. .  History of Present Illness: 1.  As above. No dyspnea. No fever. Drinking fluids okay.   No generalized aching. Has tried Dayquil--does help.   Pt. has not been using Advair--using Ventolin once daily  2.  Hypertension:  on and off Norvasc  3.  Depression:  not sure how long tried Zoloft, but did not pick up any of medications until November--all prescribed in May and June and has only filleds the Norvasc one more time since November.  Feeling depressed on and off.  Current Medications (verified): 1)  Ventolin Hfa 108 (90 Base) Mcg/act Aers (Albuterol Sulfate) .... 2 Puffs 2-3 Times A Day 2)  Amlodipine Besylate 10 Mg Tabs (Amlodipine Besylate) .[name].. 1 Tab By Mouth Daily 3)   Clobetasol Propionate 0.05 % Crea (Clobetasol Propionate) .... Apply To Patches Two Times A Day As Needed 4)  Xyzal 5 Mg Tabs (Levocetirizine Dihydrochloride) .[name].. 1 Tab By Mouth Daily As Needed Allergies 5)  Nasacort Aq 55 Mcg/act Aers (Triamcinolone Acetonide(Nasal)) .... 2 Sprays Each Nostril Daily 6)  Advair Diskus 100-50 Mcg/dose Aepb (Fluticasone-Salmeterol) .[name].. 1 Inhalation Two Times A Day.  Brush Teeth and Tongue After Each Use 7)  Zoloft 50 Mg Tabs (Sertraline Hcl) .... 1/2 Tab By Mouth Daily For 7 Days, Then 1 Tab Daily Thereafter.  Allergies (verified): No Known Drug Allergies  Physical Exam  General:  Sounds nasally congested.  Smells of cigarettes Head:  NT over sinuses Eyes:  No injection or discharge Ears:  External ear exam shows no significant lesions or deformities.  Otoscopic examination reveals clear canals, tympanic membranes are intact bilaterally without bulging, retraction, inflammation or discharge. Hearing  is grossly normal bilaterally. Nose:  mucosa swollen and red with clear discharge Mouth:  pharynx pink and moist.  No exudate Neck:  No deformities, masses, or tenderness noted. Lungs:  Normal respiratory effort, chest expands symmetrically. Lungs are clear to auscultation, no crackles or wheezes. Heart:  Normal rate and regular rhythm. S1 and S2 normal without gallop, murmur, click, rub or other extra sounds.  Radial pulses normal and equal   Impression & Recommendations:  Problem # 1:  URI (ICD-465.9) Supportive care--may use Dayquil and Nyquil combination To call if no improvement in 1 week. Her updated medication list for this problem includes:    Loratadine 10 Mg Tabs (Loratadine) .[name]... 1 tab by mouth daily as needed allergies  Problem # 2:  COPD (ICD-496) No exacerbation today--not needing Advair, so will remove from med list The following medications were removed from the medication list:    Advair Diskus 100-50 Mcg/dose Aepb  (Fluticasone-salmeterol) .[name]... 1 inhalation two times a day.  brush teeth and tongue after each use Her updated medication list for this problem includes:    Ventolin Hfa 108 (90 Base) Mcg/act Aers (Albuterol sulfate) .[name]... 2 puffs 2-3 times a day  Orders: Peak Flow Rate (94150) Pulse Oximetry (single measurment) (98119)  Problem # 3:  TOBACCO ABUSE (ICD-305.1) Encouraged stopping.  Problem # 4:  ESSENTIAL HYPERTENSION (ICD-401.9) to fill and take Amlodipine regularly Her updated medication list for this problem includes:    Amlodipine Besylate 10 Mg Tabs (Amlodipine besylate) .[name]... 1 tab by mouth daily  Problem # 5:  DEPRESSION (ICD-311) Start Zoloft again. Take at night if makes drowsy Her updated medication list for this problem includes:    Zoloft 50 Mg Tabs (Sertraline hcl) .[name]... 1/2 tab by mouth daily for 7 days, then 1 tab daily thereafter.  Complete Medication List: 1)  Ventolin Hfa 108 (90 Base) Mcg/act Aers (Albuterol sulfate) .... 2 puffs 2-3 times a day 2)  Amlodipine Besylate 10 Mg Tabs (Amlodipine besylate) .[name].. 1 tab by mouth daily 3)  Clobetasol Propionate 0.05 % Crea (Clobetasol propionate) .... Apply to patches two times a day as needed 4)  Loratadine 10 Mg Tabs (Loratadine) .[name].. 1 tab by mouth daily as needed allergies 5)  Nasacort Aq 55 Mcg/act Aers (Triamcinolone acetonide(nasal)) .... 2 sprays each nostril daily 6)  Zoloft 50 Mg Tabs (Sertraline hcl) .... 1/2 tab by mouth daily for 7 days, then 1 tab daily thereafter.  Patient Instructions: 1)  Take Zoloft at bedtime if makes sleepy 2)  Follow up with Dr. Delrae Alfred in 2 months --depression, htn Prescriptions: ZOLOFT 50 MG TABS (SERTRALINE HCL) 1/2 tab by mouth daily for 7 days, then 1 tab daily thereafter.  #30 x 2   Entered and Authorized by:   Julieanne Manson MD   Signed by:   Julieanne Manson MD on 04/03/2010   Method used:   Faxed to ...       [facility] (retail)        [address] [place], [place]  [address]       Ph: [phone]       Fax: [phone]   RxID:   [id number] NASACORT AQ 55 MCG/ACT AERS (TRIAMCINOLONE ACETONIDE(NASAL)) 2 sprays each nostril daily  #1 x 11   Entered and Authorized by:   Julieanne Manson MD   Signed by:   Julieanne Manson MD on 04/03/2010   Method used:   Faxed to .[name] KitchenMarland Kitchen  [facility] - [facility] (retail)       [address] [place], [place]  [address]       Ph: [phone]       Fax: [phone]   RxID:   [id number] CLOBETASOL PROPIONATE 0.05 % CREA (CLOBETASOL PROPIONATE) apply to patches two times a day as needed  #60g x 4   Entered and Authorized by:   Julieanne Manson MD   Signed by:   Julieanne Manson MD on 04/03/2010   Method used:   Faxed to ...       [facility] - [facility] (retail)       [address] [place], [place]  [address]       Ph: [phone]       Fax: [phone]   RxID:   [id number] AMLODIPINE BESYLATE 10 MG TABS (AMLODIPINE BESYLATE) 1 tab by mouth daily  #30 x 11   Entered and Authorized by:   Julieanne Manson MD   Signed by:   Julieanne Manson MD on 04/03/2010   Method used:   Faxed to ...       [facility] - [facility] (retail)       [address] [place], [place]  [address]       Ph: [phone]       Fax: [phone]   RxID:   [id number] VENTOLIN HFA 108 (90 BASE) MCG/ACT AERS (ALBUTEROL SULFATE) 2 puffs 2-3 times a day  #1 x 0   Entered and Authorized by:   Julieanne Manson MD   Signed by:   Julieanne Manson MD on 04/03/2010   Method used:   Faxed to ...       [facility] - [facility] (retail)       [address] [place], [place]  [address]       Ph: [phone]       Fax: [phone]   RxID:   [id number] LORATADINE 10 MG TABS (LORATADINE) 1 tab by mouth daily as needed allergies   #30 x 11   Entered and Authorized by:   Julieanne Manson MD   Signed by:   Julieanne Manson MD on 04/03/2010   Method used:   Faxed to ...       [facility] - [facility] (retail)       [address] [place], [place]  [address]       Ph: [phone]       Fax: [phone]   RxID:   [id number]    Orders Added: 1)  Peak Flow Rate [94150] 2)  Pulse Oximetry (single measurment) [94760]  Appended Document: Cold x3 days    Clinical Lists Changes  Orders: Added new Service order of Est. Patient Level IV (24580) - Signed

## 2010-05-07 LAB — CBC
HCT: 33.3 % — ABNORMAL LOW (ref 36.0–46.0)
HCT: 35.5 % — ABNORMAL LOW (ref 36.0–46.0)
HCT: 35.9 % — ABNORMAL LOW (ref 36.0–46.0)
Hemoglobin: 10.9 g/dL — ABNORMAL LOW (ref 12.0–15.0)
Hemoglobin: 11 g/dL — ABNORMAL LOW (ref 12.0–15.0)
Hemoglobin: 11.3 g/dL — ABNORMAL LOW (ref 12.0–15.0)
Hemoglobin: 11.9 g/dL — ABNORMAL LOW (ref 12.0–15.0)
MCHC: 33.1 g/dL (ref 30.0–36.0)
MCHC: 33.5 g/dL (ref 30.0–36.0)
MCHC: 33.9 g/dL (ref 30.0–36.0)
Platelets: 186 10*3/uL (ref 150–400)
Platelets: 205 10*3/uL (ref 150–400)
Platelets: 212 10*3/uL (ref 150–400)
RBC: 3.2 MIL/uL — ABNORMAL LOW (ref 3.87–5.11)
RBC: 3.36 MIL/uL — ABNORMAL LOW (ref 3.87–5.11)
RBC: 3.5 MIL/uL — ABNORMAL LOW (ref 3.87–5.11)
RBC: 3.57 MIL/uL — ABNORMAL LOW (ref 3.87–5.11)
RDW: 15 % (ref 11.5–15.5)
RDW: 15.3 % (ref 11.5–15.5)
WBC: 4.4 10*3/uL (ref 4.0–10.5)
WBC: 4.6 10*3/uL (ref 4.0–10.5)
WBC: 5.5 10*3/uL (ref 4.0–10.5)
WBC: 5.6 10*3/uL (ref 4.0–10.5)
WBC: 5.8 10*3/uL (ref 4.0–10.5)

## 2010-05-07 LAB — BASIC METABOLIC PANEL
Chloride: 101 mEq/L (ref 96–112)
Creatinine, Ser: 0.62 mg/dL (ref 0.4–1.2)
Glucose, Bld: 91 mg/dL (ref 70–99)
Potassium: 3.6 mEq/L (ref 3.5–5.1)
Sodium: 135 mEq/L (ref 135–145)

## 2010-05-08 LAB — URINE CULTURE

## 2010-05-08 LAB — BASIC METABOLIC PANEL
BUN: 4 mg/dL — ABNORMAL LOW (ref 6–23)
CO2: 25 mEq/L (ref 19–32)
CO2: 27 mEq/L (ref 19–32)
Chloride: 100 mEq/L (ref 96–112)
Chloride: 103 mEq/L (ref 96–112)
Creatinine, Ser: 0.56 mg/dL (ref 0.4–1.2)
GFR calc Af Amer: >60 mL/min (ref >60)
GFR calc non Af Amer: >60 mL/min (ref >60)
Glucose, Bld: 92 mg/dL (ref 70–99)
Potassium: 2.9 mEq/L — ABNORMAL LOW (ref 3.5–5.1)
Potassium: 3.7 mEq/L (ref 3.5–5.1)
Sodium: 138 mEq/L (ref 135–145)

## 2010-05-08 LAB — MAGNESIUM
Magnesium: 1.8 mg/dL (ref 1.5–2.5)
Magnesium: 1.9 mg/dL (ref 1.5–2.5)

## 2010-05-08 LAB — COMPREHENSIVE METABOLIC PANEL
ALT: 19 U/L (ref 0–35)
Alkaline Phosphatase: 35 U/L — ABNORMAL LOW (ref 39–117)
BUN: 3 mg/dL — ABNORMAL LOW (ref 6–23)
CO2: 28 mEq/L (ref 19–32)
CO2: 29 mEq/L (ref 19–32)
Chloride: 104 mEq/L (ref 96–112)
Creatinine, Ser: 0.58 mg/dL (ref 0.4–1.2)
GFR calc non Af Amer: >60 mL/min (ref >60)
GFR calc non Af Amer: >60 mL/min (ref >60)
Glucose, Bld: 98 mg/dL (ref 70–99)
Potassium: 3.7 mEq/L (ref 3.5–5.1)
Sodium: 135 mEq/L (ref 135–145)
Total Bilirubin: 0.8 mg/dL (ref 0.3–1.2)
Total Protein: 5.3 g/dL — ABNORMAL LOW (ref 6.0–8.3)

## 2010-05-08 LAB — DIFFERENTIAL
Basophils Absolute: 0 10*3/uL (ref 0.0–0.1)
Basophils Relative: 1 % (ref 0–1)
Basophils Relative: 1 % (ref 0–1)
Eosinophils Absolute: 0.2 10*3/uL (ref 0.0–0.7)
Eosinophils Relative: 2 % (ref 0–5)
Lymphocytes Relative: 26 % (ref 12–46)
Lymphs Abs: 2.3 10*3/uL (ref 0.7–4.0)
Lymphs Abs: 3.4 10*3/uL (ref 0.7–4.0)
Monocytes Absolute: 0.8 10*3/uL (ref 0.1–1.0)
Monocytes Relative: 11 % (ref 3–12)
Monocytes Relative: 15 % — ABNORMAL HIGH (ref 3–12)
Neutro Abs: 3.7 10*3/uL (ref 1.7–7.7)
Neutrophils Relative %: 56 % (ref 43–77)

## 2010-05-08 LAB — CULTURE, BLOOD (ROUTINE X 2)

## 2010-05-08 LAB — BLOOD GAS, ARTERIAL
Acid-base deficit: 1 mmol/L (ref 0.0–2.0)
Drawn by: 103701
O2 Saturation: 94.3 %
TCO2: 21.3 mmol/L (ref 0–100)
pCO2 arterial: 41.5 mmHg (ref 35.0–45.0)
pO2, Arterial: 78.7 mmHg — ABNORMAL LOW (ref 80.0–100.0)

## 2010-05-08 LAB — CBC
HCT: 34.9 % — ABNORMAL LOW (ref 36.0–46.0)
HCT: 38 % (ref 36.0–46.0)
Hemoglobin: 11.5 g/dL — ABNORMAL LOW (ref 12.0–15.0)
Hemoglobin: 12.6 g/dL (ref 12.0–15.0)
MCHC: 33.4 g/dL (ref 30.0–36.0)
MCV: 101 fL — ABNORMAL HIGH (ref 78.0–100.0)
MCV: 101.5 fL — ABNORMAL HIGH (ref 78.0–100.0)
MCV: 101.9 fL — ABNORMAL HIGH (ref 78.0–100.0)
MCV: 102.2 fL — ABNORMAL HIGH (ref 78.0–100.0)
Platelets: 177 10*3/uL (ref 150–400)
Platelets: 227 10*3/uL (ref 150–400)
RBC: 3.36 MIL/uL — ABNORMAL LOW (ref 3.87–5.11)
RBC: 3.41 MIL/uL — ABNORMAL LOW (ref 3.87–5.11)
RBC: 3.76 MIL/uL — ABNORMAL LOW (ref 3.87–5.11)
WBC: 6.3 10*3/uL (ref 4.0–10.5)
WBC: 6.4 10*3/uL (ref 4.0–10.5)
WBC: 6.7 10*3/uL (ref 4.0–10.5)
WBC: 7.5 10*3/uL (ref 4.0–10.5)

## 2010-05-08 LAB — URINALYSIS, ROUTINE W REFLEX MICROSCOPIC
Nitrite: POSITIVE — AB
Specific Gravity, Urine: 1.019 (ref 1.005–1.030)
Urobilinogen, UA: 1 mg/dL (ref 0.0–1.0)

## 2010-05-08 LAB — POCT I-STAT, CHEM 8
Creatinine, Ser: 0.7 mg/dL (ref 0.4–1.2)
Glucose, Bld: 86 mg/dL (ref 70–99)
HCT: 49 % — ABNORMAL HIGH (ref 36.0–46.0)
Hemoglobin: 16.7 g/dL — ABNORMAL HIGH (ref 12.0–15.0)
Potassium: 3.2 mEq/L — ABNORMAL LOW (ref 3.5–5.1)
Sodium: 137 mEq/L (ref 135–145)
TCO2: 27 mmol/L (ref 0–100)

## 2010-05-08 LAB — CALCIUM: Calcium: 8.8 mg/dL (ref 8.4–10.5)

## 2010-05-08 LAB — URINE MICROSCOPIC-ADD ON

## 2010-05-08 LAB — WOUND CULTURE

## 2010-05-08 LAB — ETHANOL: Alcohol, Ethyl (B): <5 mg/dL (ref 0–10)

## 2010-05-08 LAB — POCT CARDIAC MARKERS: CKMB, poc: <1 ng/mL — ABNORMAL LOW (ref 1.0–8.0)

## 2010-06-20 NOTE — Op Note (Signed)
[facility]  Patient:    Marie Woods, Marie Woods Visit Number: [id number] MRN: [id number]          Service Type: EXP Location: ED Attending Physician:  Benny Lennert Dictated by:   Dominica Severin III, M.D. Proc. Date: 05/22/01 Admit Date:  05/22/2001                             Operative Report  DATE OF BIRTH:  1956/01/06  INDICATIONS FOR PROCEDURE:  I had the pleasure to see Marie Woods in the [facility] Emergency Room. This patient is a 55 year old, right hand dominant female who presents with a left small finger fracture after playing volleyball today. She denies open injury or other injury to the extremities. She was playing volleyball and was hit and subsequently sustained a fracture with deformity. She denies nausea, vomiting, fever, chills or other problems at this juncture.  PAST MEDICAL HISTORY:  Asthma.  PAST SURGICAL HISTORY:  None.  MEDICATIONS:  Albuterol.  ALLERGIES:  None.  SOCIAL HISTORY:  She smokes 1/2 pack per day. 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 She drinks occasionally.  PHYSICAL EXAMINATION:  Black female alert and oriented in no acute distress. The patient is cooperative with the exam and pleasant. She has stable vitals. She has an obvious deformity to the left small finger proximal phalanx. She is noted to have normal sensation and capillary refill. FDP, FDS and extensor function is intact notable after a digital block/ulnar nerve block. There are no signs of compartment syndrome, DVT, or infection. Her right upper extremity is atraumatic. HEENT is normal. Neck and back are nontender. X-rays show a displaced proximal phalanx fracture, left small finger.  IMPRESSION:  Closed left proximal phalanx fracture about the small finger with displacement.  PLAN:  I have verbally consent her for wrist block and reduction as necessary.  DESCRIPTION OF PROCEDURE:  The patient was given an ulnar nerve block  with lidocaine without epinephrine 2% mixture. Following this, the patient then underwent ulnar nerve block to my satisfaction including the main branch and the dorsal sensory branch. This achieved adequate anesthesia and following this, she underwent a manipulative reduction followed by casting. Post reduction x-rays were reviewed. The patient has a satisfactory position at the present time. I am going to ask her to ice, elevate, and to adhere to post reduction precautions as outlined to her at length. If she has any problems, she will notify me, otherwise I look forward to seeing her in the office for repeat films in seven days. Vicodin was given for post reduction pain. She was asked not to drink alcohol, drive a car, or make important decisions while on the pain medicine. She will notify me should any problems occur or cast tightness develops. Dictated by:   Dominica Severin III, M.D. Attending Physician:  Benny Lennert DD:  05/22/01 TD:  05/23/01 Job: [id number]

## 2010-07-23 ENCOUNTER — Other Ambulatory Visit (HOSPITAL_COMMUNITY): Payer: Self-pay | Admitting: Internal Medicine

## 2010-07-23 DIAGNOSIS — Z1231 Encounter for screening mammogram for malignant neoplasm of breast: Secondary | ICD-10-CM

## 2010-08-05 ENCOUNTER — Ambulatory Visit (HOSPITAL_COMMUNITY)
Admission: RE | Admit: 2010-08-05 | Discharge: 2010-08-05 | Disposition: A | Discharge status: Home / Self Care | Payer: Self-pay | Source: Ambulatory Visit | Attending: Internal Medicine | Admitting: Internal Medicine

## 2010-08-05 DIAGNOSIS — Z1231 Encounter for screening mammogram for malignant neoplasm of breast: Secondary | ICD-10-CM | POA: Insufficient documentation

## 2010-10-13 IMAGING — CT CT EXTREM LOW W/ CM*L*
1 of 2 series · 1 of 14 positions shown, 2 images · IV contrast (agent unspecified)
Comparison: Plain film examination 11/29/2008.

Addendum Begins

The second impression should read
unopacified and opacified venous blood.
Addendum Ends
CLINICAL DATA: Right cellulitis.
CT RIGHT LOWER LEG (CALF) WITH CONTRAST
TECHNIQUE: Multidetector CT imaging of the right lower leg was
performed according to the standard protocol following intravenous
contrast administration. Multiplanar CT image reconstructions were
also generated.
Contrast: 100 ml Lmnipaque-AZZ.

[Series 3: control scan 2.0 b60s · axial · 0.27mm/px · z∈[-1082,-1082]mm · 1 of 1 slices shown, 2 images]
[im 1/1  soft-tissue]
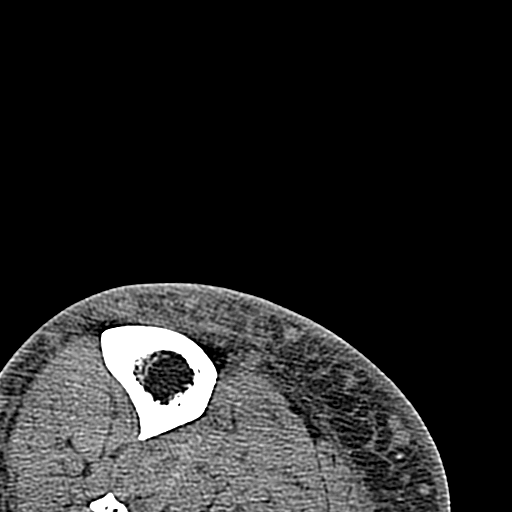
[im 1/1  bone]
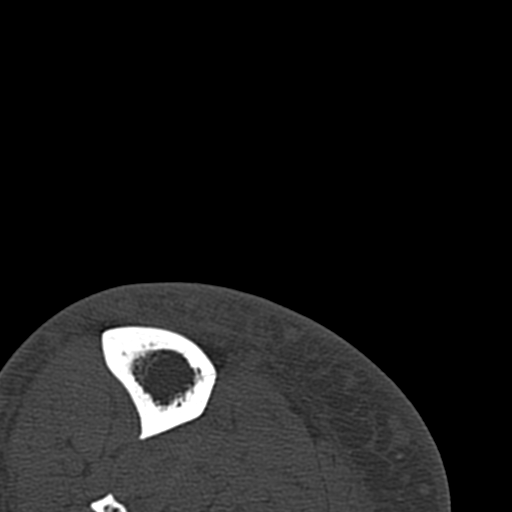

[1 of 14 positions shown; findings below may reference images not displayed]

FINDINGS: Prominent cellulitis without deep drainable abscess or
evidence of osteomyelitis.  The fat planes and fascial planes for
the most part appear preserved throughout the calf.  If there were
progressive symptoms and further delineation was clinically
desired, MR imaging may be considered as this may prove more
sensitive for detection of osteomyelitis, abscess or fasciitis.

Popliteal vein with filling defects which may represent straining
of unopacified and opacified blood although a blood clot cannot be
excluded.  Ultrasound can be obtained for further delineation if
clinically desired.
IMPRESSION: Prominent cellulitis without deep drainable abscess as noted above.

Venous thrombosis cannot be excluded although the appearance may be
related to mixture of unopacified are opacified venous blood.

This has been made a call report.

## 2010-10-23 LAB — CBC
HCT: 37
MCV: 102.2 — ABNORMAL HIGH
Platelets: 183
RDW: 16.4 — ABNORMAL HIGH

## 2010-10-23 LAB — BASIC METABOLIC PANEL
BUN: 9
GFR calc non Af Amer: >60
Glucose, Bld: 93
Potassium: 3.2 — ABNORMAL LOW

## 2010-10-23 LAB — DIFFERENTIAL
Basophils Absolute: 0
Basophils Relative: 0
Eosinophils Absolute: 0
Eosinophils Relative: 0
Monocytes Absolute: 0.6
Monocytes Relative: 5
Neutro Abs: 8.9 — ABNORMAL HIGH

## 2010-10-23 LAB — RAPID URINE DRUG SCREEN, HOSP PERFORMED: Tetrahydrocannabinol: NOT DETECTED

## 2010-10-23 LAB — B-NATRIURETIC PEPTIDE (CONVERTED LAB): Pro B Natriuretic peptide (BNP): 53.7

## 2010-10-23 LAB — POCT PREGNANCY, URINE
Operator id: 30368
Preg Test, Ur: NEGATIVE

## 2010-12-05 ENCOUNTER — Emergency Department (HOSPITAL_COMMUNITY)
Admission: EM | Admit: 2010-12-05 | Discharge: 2010-12-05 | Disposition: A | Discharge status: Home / Self Care | Payer: Self-pay | Attending: Emergency Medicine | Admitting: Emergency Medicine

## 2010-12-05 DIAGNOSIS — J45909 Unspecified asthma, uncomplicated: Secondary | ICD-10-CM | POA: Insufficient documentation

## 2011-05-25 ENCOUNTER — Other Ambulatory Visit (HOSPITAL_COMMUNITY): Payer: Self-pay | Admitting: Internal Medicine

## 2011-05-25 DIAGNOSIS — Z1231 Encounter for screening mammogram for malignant neoplasm of breast: Secondary | ICD-10-CM

## 2011-08-07 ENCOUNTER — Ambulatory Visit (HOSPITAL_COMMUNITY): Payer: Self-pay

## 2011-08-10 ENCOUNTER — Ambulatory Visit (HOSPITAL_COMMUNITY): Admission: RE | Admit: 2011-08-10 | Payer: Self-pay | Source: Ambulatory Visit

## 2011-09-01 ENCOUNTER — Ambulatory Visit (HOSPITAL_COMMUNITY)
Admission: RE | Admit: 2011-09-01 | Discharge: 2011-09-01 | Disposition: A | Discharge status: Home / Self Care | Payer: Self-pay | Source: Ambulatory Visit | Attending: Internal Medicine | Admitting: Internal Medicine

## 2011-09-01 DIAGNOSIS — Z1231 Encounter for screening mammogram for malignant neoplasm of breast: Secondary | ICD-10-CM | POA: Insufficient documentation

## 2012-07-19 ENCOUNTER — Ambulatory Visit: Payer: Self-pay | Attending: Family Medicine | Admitting: Internal Medicine

## 2012-07-19 VITALS — BP 170/96 | HR 84 | Temp 98.0°F | Resp 18 | Ht 66.0 in | Wt 221.0 lb

## 2012-07-19 DIAGNOSIS — F17200 Nicotine dependence, unspecified, uncomplicated: Secondary | ICD-10-CM | POA: Insufficient documentation

## 2012-07-19 DIAGNOSIS — J449 Chronic obstructive pulmonary disease, unspecified: Secondary | ICD-10-CM

## 2012-07-19 DIAGNOSIS — L309 Dermatitis, unspecified: Secondary | ICD-10-CM

## 2012-07-19 DIAGNOSIS — L259 Unspecified contact dermatitis, unspecified cause: Secondary | ICD-10-CM | POA: Insufficient documentation

## 2012-07-19 DIAGNOSIS — J4489 Other specified chronic obstructive pulmonary disease: Secondary | ICD-10-CM | POA: Insufficient documentation

## 2012-07-19 DIAGNOSIS — I10 Essential (primary) hypertension: Secondary | ICD-10-CM | POA: Insufficient documentation

## 2012-07-19 MED ORDER — HYDROCHLOROTHIAZIDE 25 MG PO TABS: 25.0000 mg | ORAL_TABLET | Freq: Every day | ORAL | Status: DC

## 2012-07-19 MED ORDER — CLOBETASOL PROPIONATE 0.05 % EX CREA: TOPICAL_CREAM | Freq: Two times a day (BID) | CUTANEOUS | Status: DC

## 2012-07-19 MED ORDER — POTASSIUM CHLORIDE CRYS ER 20 MEQ PO TBCR: 20.0000 meq | EXTENDED_RELEASE_TABLET | Freq: Every day | ORAL | Status: DC

## 2012-07-19 MED ORDER — AMLODIPINE BESYLATE 10 MG PO TABS: 10.0000 mg | ORAL_TABLET | Freq: Every day | ORAL | Status: DC

## 2012-07-19 NOTE — Progress Notes (Signed)
Patient states she needs refills of hypertension medication; also complains of right leg pain from eczema.

## 2012-07-19 NOTE — Progress Notes (Signed)
[name], female   DOB: [birth year], 57 y.o.   MRN: [id number] Patient Demographics  Marie Woods, is a 57 y.o. female  [id number]  [id number]  DOB - 19-Feb-1955  Chief Complaint  Patient presents with  . Establish Care  . Hypertension        Subjective:   Marie Woods today is here to establish primary care. Patient has a  Past Medical History  Diagnosis Date  . Hypertension   . Asthma   . Patient has a history of COPD/asthma, tobacco abuse, hypertension, chronic eczema. She claims she has almost unchanged skin changes in her right leg and to some extent in the left leg for the past few months. She is using Clobestol cream without much effect. She denies any fever, or any worsening of her usual eczematous rash. Patient has also has No headache, No chest pain, No abdominal pain,No Nausea, No new weakness tingling or numbness, No Cough or SOB.   Objective:    Filed Vitals:   07/19/12 1210  BP: 170/96  Pulse: 84  Temp: 98 F (36.7 C)  TempSrc: Oral  Resp: 18  Height: 5\' 6"  (1.676 m)  Weight: 221 lb (100.245 kg)  SpO2: 99%     ALLERGIES:  No Known Allergies  PAST MEDICAL HISTORY: Past Medical History  Diagnosis Date  . Hypertension   . Asthma     PAST SURGICAL HISTORY: History reviewed. No pertinent past surgical history.  FAMILY HISTORY: Family History  Problem Relation Age of Onset  . Cancer Mother   . Heart disease Mother   . Heart disease Father     MEDICATIONS AT HOME: Prior to Admission medications   Medication Sig Start Date End Date Taking? Authorizing Provider  albuterol (PROVENTIL HFA;VENTOLIN HFA) 108 (90 BASE) MCG/ACT inhaler Inhale 2 puffs into the lungs every 6 (six) hours as needed for wheezing.   Yes Historical Provider, MD  amLODipine (NORVASC) 10 MG tablet Take 1 tablet (10 mg total) by mouth daily. 07/19/12  Yes Shanker Levora Dredge, MD  clobetasol cream (TEMOVATE) 0.05 % Apply topically 2 (two) times daily.  07/19/12  Yes Shanker Levora Dredge, MD  loratadine (CLARITIN) 10 MG tablet Take 10 mg by mouth daily.   Yes Historical Provider, MD  hydrochlorothiazide (HYDRODIURIL) 25 MG tablet Take 1 tablet (25 mg total) by mouth daily. 07/19/12   Shanker Levora Dredge, MD  potassium chloride SA (K-DUR,KLOR-CON) 20 MEQ tablet Take 1 tablet (20 mEq total) by mouth daily. 07/19/12   Shanker Levora Dredge, MD    SOCIAL HISTORY:   reports that she has been smoking Cigarettes.  She has been smoking about 0.50 packs per day. She does not have any smokeless tobacco history on file. She reports that  drinks alcohol. She reports that she does not use illicit drugs.  REVIEW OF SYSTEMS:  Constitutional:   No   Fevers, chills, fatigue.  HEENT:    No headaches, Sore throat,   Cardio-vascular: No chest pain,  Orthopnea, swelling in lower extremities, anasarca, palpitations  GI:  No abdominal pain, nausea, vomiting, diarrhea  Resp: No shortness of breath,  No coughing up of blood.No cough.No wheezing.  Skin:  no rash or lesions.  GU:  no dysuria, change in color of urine, no urgency or frequency.  No flank pain.  Musculoskeletal: No joint pain or swelling.  No decreased range of motion.  No back pain.  Psych: No change in mood or affect. No depression or anxiety.  No memory loss.   Exam  General appearance :Awake, alert, not in any distress. Speech Clear. Not toxic Looking HEENT: Atraumatic and Normocephalic, pupils equally reactive to light and accomodation Neck: supple, no JVD. No cervical lymphadenopathy.  Chest:Good air entry bilaterally, no added sounds  CVS: S1 S2 regular, no murmurs.  Abdomen: Bowel sounds present, Non tender and not distended with no gaurding, rigidity or rebound. Extremities: B/L Lower Ext shows no edema, both legs are warm to touch Neurology: Awake alert, and oriented X 3, CN II-XII intact, Non focal Skin: Chronic eczematous rash with some excoriation in the right leg-from right  ankle area all the way up to the third of the leg. A patch of eczematous changes on the left lateral leg just above the ankle. No weeping seen, no discharge, no erythema Wounds:N/A    Data Review   CBC No results found for this basename: WBC, HGB, HCT, PLT, MCV, MCH, MCHC, RDW, NEUTRABS, LYMPHSABS, MONOABS, EOSABS, BASOSABS, BANDABS, BANDSABD,  in the last 168 hours  Chemistries   No results found for this basename: NA, K, CL, CO2, GLUCOSE, BUN, CREATININE, GFRCGP, CALCIUM, MG, AST, ALT, ALKPHOS, BILITOT,  in the last 168 hours ------------------------------------------------------------------------------------------------------------------ No results found for this basename: HGBA1C,  in the last 72 hours ------------------------------------------------------------------------------------------------------------------ No results found for this basename: CHOL, HDL, LDLCALC, TRIG, CHOLHDL, LDLDIRECT,  in the last 72 hours ------------------------------------------------------------------------------------------------------------------ No results found for this basename: TSH, T4TOTAL, FREET3, T3FREE, THYROIDAB,  in the last 72 hours ------------------------------------------------------------------------------------------------------------------ No results found for this basename: VITAMINB12, FOLATE, FERRITIN, TIBC, IRON, RETICCTPCT,  in the last 72 hours  Coagulation profile  No results found for this basename: INR, PROTIME,  in the last 168 hours    Assessment & Plan   Uncontrolled hypertension - Refill amlodipine, add hydrochlorothiazide with KCl supplementation - Recheck BP next visit  COPD/asthma - Lungs completely clear on exam - Continue with as needed albuterol inhaler - Reassess next visit whether she needs to be on maintenance inhaler   Eczema - Continue with low-clobestol cream - Referred to dermatology  Tobacco abuse - She has no interest in quitting currently. I  have counseled her regarding Completely stopping tobacco use.   we'll obtain routine labs and follow her up in one month   I have instructed her to go to the emergency room if her right leg or the left leg-work and developed further swelling inflammation with redness and fever. She claims understanding.

## 2012-08-02 ENCOUNTER — Other Ambulatory Visit: Payer: Self-pay

## 2012-08-16 ENCOUNTER — Ambulatory Visit: Payer: Self-pay

## 2014-02-14 ENCOUNTER — Encounter (HOSPITAL_COMMUNITY): Payer: Self-pay | Admitting: Emergency Medicine

## 2014-02-14 ENCOUNTER — Emergency Department (HOSPITAL_COMMUNITY)
Admission: EM | Admit: 2014-02-14 | Discharge: 2014-02-14 | Discharge status: Against Medical Advice | Payer: Self-pay | Attending: Emergency Medicine | Admitting: Emergency Medicine

## 2014-02-14 DIAGNOSIS — I10 Essential (primary) hypertension: Secondary | ICD-10-CM | POA: Insufficient documentation

## 2014-02-14 DIAGNOSIS — J45909 Unspecified asthma, uncomplicated: Secondary | ICD-10-CM | POA: Insufficient documentation

## 2014-02-14 DIAGNOSIS — L988 Other specified disorders of the skin and subcutaneous tissue: Secondary | ICD-10-CM | POA: Insufficient documentation

## 2014-02-14 DIAGNOSIS — Z720 Tobacco use: Secondary | ICD-10-CM | POA: Insufficient documentation

## 2014-02-14 NOTE — ED Notes (Signed)
Pt seen leaving the department. Asked pt where she was going and pt refused to answer.

## 2014-02-14 NOTE — ED Notes (Signed)
Pt has dry skin to body, pt states it itches. Pt states it is eczema.

## 2014-02-14 NOTE — ED Notes (Signed)
Pt out of room asking when she will be soon. Explained to pt that the PA was making his way around the rooms and he would be in to see her shortly.

## 2014-02-14 NOTE — ED Notes (Signed)
Pt left w/o being seen by PA.

## 2014-05-28 ENCOUNTER — Emergency Department (HOSPITAL_COMMUNITY)
Admission: EM | Admit: 2014-05-28 | Discharge: 2014-05-28 | Disposition: A | Discharge status: Home / Self Care | Payer: Self-pay | Attending: Emergency Medicine | Admitting: Emergency Medicine

## 2014-05-28 ENCOUNTER — Encounter (HOSPITAL_COMMUNITY): Payer: Self-pay | Admitting: *Deleted

## 2014-05-28 DIAGNOSIS — Z720 Tobacco use: Secondary | ICD-10-CM | POA: Insufficient documentation

## 2014-05-28 DIAGNOSIS — Z79899 Other long term (current) drug therapy: Secondary | ICD-10-CM | POA: Insufficient documentation

## 2014-05-28 DIAGNOSIS — I10 Essential (primary) hypertension: Secondary | ICD-10-CM | POA: Insufficient documentation

## 2014-05-28 DIAGNOSIS — L309 Dermatitis, unspecified: Secondary | ICD-10-CM | POA: Insufficient documentation

## 2014-05-28 DIAGNOSIS — J45909 Unspecified asthma, uncomplicated: Secondary | ICD-10-CM | POA: Insufficient documentation

## 2014-05-28 HISTORY — DX: Dermatitis, unspecified: L30.9

## 2014-05-28 LAB — TSH: TSH: 0.936 u[IU]/mL (ref 0.350–4.500)

## 2014-05-28 MED ORDER — HYDROXYZINE HCL 10 MG PO TABS: 10.0000 mg | ORAL_TABLET | Freq: Three times a day (TID) | ORAL | Status: DC | PRN

## 2014-05-28 MED ORDER — EUCERIN EX CREA: TOPICAL_CREAM | CUTANEOUS | Status: DC | PRN

## 2014-05-28 MED ORDER — PREDNISONE 10 MG PO TABS: ORAL_TABLET | ORAL | Status: DC

## 2014-05-28 NOTE — Discharge Instructions (Signed)
Eczema Eczema, also called atopic dermatitis, is a skin disorder that causes inflammation of the skin. It causes a red rash and dry, scaly skin. The skin becomes very itchy. Eczema is generally worse during the cooler winter months and often improves with the warmth of summer. Eczema usually starts showing signs in infancy. Some children outgrow eczema, but it may last through adulthood.  CAUSES  The exact cause of eczema is not known, but it appears to run in families. People with eczema often have a family history of eczema, allergies, asthma, or hay fever. Eczema is not contagious. Flare-ups of the condition may be caused by:   Contact with something you are sensitive or allergic to.   Stress. SIGNS AND SYMPTOMS  Dry, scaly skin.   Red, itchy rash.   Itchiness. This may occur before the skin rash and may be very intense.  DIAGNOSIS  The diagnosis of eczema is usually made based on symptoms and medical history. TREATMENT  Eczema cannot be cured, but symptoms usually can be controlled with treatment and other strategies. A treatment plan might include:  Controlling the itching and scratching.   Use over-the-counter antihistamines as directed for itching. This is especially useful at night when the itching tends to be worse.   Use over-the-counter steroid creams as directed for itching.   Avoid scratching. Scratching makes the rash and itching worse. It may also result in a skin infection (impetigo) due to a break in the skin caused by scratching.   Keeping the skin well moisturized with creams every day. This will seal in moisture and help prevent dryness. Lotions that contain alcohol and water should be avoided because they can dry the skin.   Limiting exposure to things that you are sensitive or allergic to (allergens).   Recognizing situations that cause stress.   Developing a plan to manage stress.  HOME CARE INSTRUCTIONS   Only take over-the-counter or  prescription medicines as directed by your health care provider.   Do not use anything on the skin without checking with your health care provider.   Keep baths or showers short (5 minutes) in warm (not hot) water. Use mild cleansers for bathing. These should be unscented. You may add nonperfumed bath oil to the bath water. It is best to avoid soap and bubble bath.   Immediately after a bath or shower, when the skin is still damp, apply a moisturizing ointment to the entire body. This ointment should be a petroleum ointment. This will seal in moisture and help prevent dryness. The thicker the ointment, the better. These should be unscented.   Keep fingernails cut short. Children with eczema may need to wear soft gloves or mittens at night after applying an ointment.   Dress in clothes made of cotton or cotton blends. Dress lightly, because heat increases itching.   A child with eczema should stay away from anyone with fever blisters or cold sores. The virus that causes fever blisters (herpes simplex) can cause a serious skin infection in children with eczema. SEEK MEDICAL CARE IF:   Your itching interferes with sleep.   Your rash gets worse or is not better within 1 week after starting treatment.   You see pus or soft yellow scabs in the rash area.   You have a fever.   You have a rash flare-up after contact with someone who has fever blisters.  Document Released: 01/17/2000 Document Revised: 11/09/2012 Document Reviewed: 08/22/2012 [facility] Patient Information 2015 [place], [place]. This information  is not intended to replace advice given to you by your health care provider. Make sure you discuss any questions you have with your health care provider.  PLEASE FOLLOW-UP WITH THE [facility], OR THE URGENT CARE CENTER, IN ONE WEEK TO ASSESS EFFECTIVENESS OF TREATMENT PLAN.  THEY WILL BE ABLE TO FOLLOW UP WITH YOUR THYROID TEST AS WELL.

## 2014-05-28 NOTE — ED Notes (Signed)
Declined W/C at D/C and was escorted to lobby by RN. 

## 2014-05-28 NOTE — ED Notes (Signed)
Pt reports excess itching, dry skin, peeling and recent hair loss. Hx of eczema.No other complaints.

## 2014-05-28 NOTE — ED Provider Notes (Signed)
CSN: [id number]     Arrival date & time 05/28/14  1526 History  This chart was scribed for non-physician practitioner, Felicie Morn, NP working with Doug Sou, MD by Greggory Stallion, ED scribe. This patient was seen in room TR10C/TR10C and the patient's care was started at 4:23 PM.   Chief Complaint  Patient presents with  . Rash   The history is provided by the patient. No language interpreter was used.    HPI Comments: Marie Woods is a 59 y.o. female with history of eczema, hypertension, and asthma who presents to the Emergency Department complaining of an itchy, dry, diffuse rash that worsened one week ago. She thinks her hair has started to fall out because of it. Pt has used neosporin over the area with no relief. She has not seen her PCP in a few years. Pt denies history of diabetes.   Past Medical History  Diagnosis Date  . Hypertension   . Asthma   . Eczema    History reviewed. No pertinent past surgical history. Family History  Problem Relation Age of Onset  . Cancer Mother   . Heart disease Mother   . Heart disease Father    History  Substance Use Topics  . Smoking status: Heavy Tobacco Smoker -- 0.50 packs/day    Types: Cigarettes  . Smokeless tobacco: Not on file  . Alcohol Use: Yes     Comment: two to three times per week   OB History    No data available     Review of Systems  Skin: Positive for rash.  All other systems reviewed and are negative.  Allergies  Review of patient's allergies indicates no known allergies.  Home Medications   Prior to Admission medications   Medication Sig Start Date End Date Taking? Authorizing Provider  albuterol (PROVENTIL HFA;VENTOLIN HFA) 108 (90 BASE) MCG/ACT inhaler Inhale 2 puffs into the lungs every 6 (six) hours as needed for wheezing.    Historical Provider, MD  amLODipine (NORVASC) 10 MG tablet Take 1 tablet (10 mg total) by mouth daily. 07/19/12   Shanker Levora Dredge, MD  clobetasol cream (TEMOVATE) 0.05 %  Apply topically 2 (two) times daily. 07/19/12   Shanker Levora Dredge, MD  hydrochlorothiazide (HYDRODIURIL) 25 MG tablet Take 1 tablet (25 mg total) by mouth daily. 07/19/12   Shanker Levora Dredge, MD  loratadine (CLARITIN) 10 MG tablet Take 10 mg by mouth daily.    Historical Provider, MD  potassium chloride SA (K-DUR,KLOR-CON) 20 MEQ tablet Take 1 tablet (20 mEq total) by mouth daily. 07/19/12   Shanker Levora Dredge, MD   BP 158/89 mmHg  Pulse 81  Temp(Src) 97.9 F (36.6 C)  Resp 18  Ht  (1.676 m)  Wt 168 lb 6.4 oz (76.386 kg)  BMI 27.19 kg/m2  SpO2 100%   Physical Exam  Constitutional: She is oriented to person, place, and time. She appears well-developed and well-nourished. No distress.  HENT:  Head: Normocephalic and atraumatic.  Eyes: Conjunctivae and EOM are normal.  Neck: Neck supple. No tracheal deviation present.  Cardiovascular: Normal rate, regular rhythm and normal heart sounds.   Pulmonary/Chest: Effort normal and breath sounds normal. No respiratory distress.  Musculoskeletal: Normal range of motion.  Neurological: She is alert and oriented to person, place, and time.  Skin: Skin is warm and dry.  Pruritic, flaky rash diffusely over pt's body consistent with eczema.   Psychiatric: She has a normal mood and affect. Her behavior is normal.  Nursing note and vitals reviewed.   ED Course  Procedures (including critical care time)  DIAGNOSTIC STUDIES: Oxygen Saturation is 100% on RA, normal by my interpretation.    COORDINATION OF CARE: 4:25 PM-Discussed treatment plan which includes prednisone, atarax, and eucerin with pt at bedside and pt agreed to plan. Advised pt to follow up with the Urgent Care or  and Wellness.   Labs Review Labs Reviewed - No data to display  Imaging Review No results found.   EKG Interpretation None     Patient with generalized pruritic, dry, scaly rash from head to toe.  Has the appearance of eczema.  Patient with history of  eczema, frequent exacerbations.  Will also obtain TSH due to recent onset of hair loss.  Patient does not have a current PCP--will have patient follow-up with [facility] or urgent care. MDM   Final diagnoses:  None    Eczematous dermatitis.   I personally performed the services described in this documentation, which was scribed in my presence. The recorded information has been reviewed and is accurate.  Felicie Mornavid Carolene Gitto, NP 05/28/14 1703  Doug SouSam Jacubowitz, MD 05/29/14 0040

## 2014-06-13 ENCOUNTER — Ambulatory Visit: Payer: Self-pay | Attending: Family Medicine | Admitting: Family Medicine

## 2014-06-13 ENCOUNTER — Encounter: Payer: Self-pay | Admitting: Family Medicine

## 2014-06-13 VITALS — BP 162/80 | HR 73 | Temp 97.7°F | Resp 18 | Ht 67.0 in | Wt 185.0 lb

## 2014-06-13 DIAGNOSIS — L209 Atopic dermatitis, unspecified: Secondary | ICD-10-CM | POA: Insufficient documentation

## 2014-06-13 DIAGNOSIS — L659 Nonscarring hair loss, unspecified: Secondary | ICD-10-CM | POA: Insufficient documentation

## 2014-06-13 DIAGNOSIS — I10 Essential (primary) hypertension: Secondary | ICD-10-CM | POA: Insufficient documentation

## 2014-06-13 MED ORDER — ALBUTEROL SULFATE HFA 108 (90 BASE) MCG/ACT IN AERS: 2.0000 | INHALATION_SPRAY | Freq: Four times a day (QID) | RESPIRATORY_TRACT | Status: DC | PRN

## 2014-06-13 MED ORDER — SELENIUM SULFIDE 2.25 % EX SHAM: 1.0000 | MEDICATED_SHAMPOO | CUTANEOUS | Status: DC

## 2014-06-13 MED ORDER — AMLODIPINE BESYLATE 10 MG PO TABS: 10.0000 mg | ORAL_TABLET | Freq: Every day | ORAL | Status: DC

## 2014-06-13 MED ORDER — HYDROCHLOROTHIAZIDE 25 MG PO TABS: 25.0000 mg | ORAL_TABLET | Freq: Every day | ORAL | Status: DC

## 2014-06-13 MED ORDER — CLOBETASOL PROPIONATE 0.05 % EX CREA: TOPICAL_CREAM | Freq: Two times a day (BID) | CUTANEOUS | Status: DC

## 2014-06-13 MED ORDER — CLOBETASOL PROPIONATE 0.05 % EX GEL: 1.0000 | Freq: Two times a day (BID) | CUTANEOUS | Status: DC

## 2014-06-13 NOTE — Progress Notes (Signed)
Subjective:    [name], female    DOB: 23-Dec-1955, 59 y.o.   MRN: [id number]  HPI  Thayer Dallasdith Rish had presented to the emergency room with generalized pruritus and dry diffuse rash and alopecia. She was diagnosed with eczema and placed on Atarax and Prednisone; her thyroid function was sent off which came back normal. She was advised to establish care here.  Interval History: She reports her skin is still itchy and she has been applying moisturizers to her skin; she used clobetasol in the past with significant improvement but she has none at this time. Blood pressure is noticeably elevated and she has not been on antihypertensives given that they appear on her medication list. She is requesting a refill on albuterol she has a history of intermittent asthma.  Past Medical History  Diagnosis Date  . Hypertension   . Asthma   . Eczema    History reviewed. No pertinent past surgical history.     Review of Systems  Constitutional: Negative for activity change, appetite change and fatigue.  HENT: Negative for congestion, sinus pressure and sore throat.   Eyes: Negative for visual disturbance.  Respiratory: Negative for cough, chest tightness, shortness of breath and wheezing.   Cardiovascular: Negative for chest pain and palpitations.  Gastrointestinal: Negative for abdominal pain, constipation and abdominal distention.  Endocrine: Negative for polydipsia.  Genitourinary: Negative for dysuria and frequency.  Musculoskeletal: Negative for back pain and arthralgias.  Skin: Negative for rash.       See HPI  Neurological: Negative for tremors, light-headedness and numbness.         Objective: Filed Vitals:   06/13/14 1408  BP: 162/80  Pulse: 73  Temp: 97.7 F (36.5 C)  Resp: 18      Physical Exam  Constitutional: She is oriented to person, place, and time. She appears well-developed and well-nourished. No distress.  HENT:  Head: Normocephalic.  Right Ear:  External ear normal.  Left Ear: External ear normal.  Nose: Nose normal.  Mouth/Throat: Oropharynx is clear and moist.  Eyes: Conjunctivae and EOM are normal. Pupils are equal, round, and reactive to light.  Neck: Normal range of motion. No JVD present.  Cardiovascular: Normal rate, regular rhythm, normal heart sounds and intact distal pulses.  Exam reveals no gallop.   No murmur heard. Pulmonary/Chest: Effort normal and breath sounds normal. No respiratory distress. She has no wheezes. She has no rales. She exhibits no tenderness.  Abdominal: Soft. Bowel sounds are normal. She exhibits no distension and no mass. There is no tenderness.  Musculoskeletal: Normal range of motion. She exhibits no edema or tenderness.  Neurological: She is alert and oriented to person, place, and time.  Skin: She is not diaphoretic.  Extensive scaly skin which is generalized along with some xerosis. Recession of anterior hair line noted.  Psychiatric: She has a normal mood and affect.      Lab Results  Component Value Date   TSH 0.936 05/28/2014         Assessment & Plan:  59 year old female patient with a history of hypertension and eczema here to establish care.  Hypertension: Uncontrolled as the patient is not currently taking any antihypertensives at this time. Restart Amlodipine and HCTZ At next visit I will check a comprehensive metabolic panel and lipid panel.  Eczema: Discussed the importance of skin care, using adequate moisturizers, avoiding frequent baths and scented products. Placed on clobetasol which helped in the past.  Alopecia:  Thyroid test is negative and so I am suspecting seborrheic dermatitis. Commenced on selenium sulfide.  She would need to return to establish care with a primary care physician and also to have a mammogram and Pap smear scheduled she is overdue.

## 2014-06-13 NOTE — Progress Notes (Signed)
Patient went to ED for eczema. Patient reports feeling better. Patient can't find cream. Patient reports clobetasol worked in past, ED did not prescribe clobetasol this time. Patient denies pain at this time. Patient only taking prednisone and hydroxyzine.

## 2014-06-13 NOTE — Patient Instructions (Signed)
Eczema Eczema, also called atopic dermatitis, is a skin disorder that causes inflammation of the skin. It causes a red rash and dry, scaly skin. The skin becomes very itchy. Eczema is generally worse during the cooler winter months and often improves with the warmth of summer. Eczema usually starts showing signs in infancy. Some children outgrow eczema, but it may last through adulthood.  CAUSES  The exact cause of eczema is not known, but it appears to run in families. People with eczema often have a family history of eczema, allergies, asthma, or hay fever. Eczema is not contagious. Flare-ups of the condition may be caused by:   Contact with something you are sensitive or allergic to.   Stress. SIGNS AND SYMPTOMS  Dry, scaly skin.   Red, itchy rash.   Itchiness. This may occur before the skin rash and may be very intense.  DIAGNOSIS  The diagnosis of eczema is usually made based on symptoms and medical history. TREATMENT  Eczema cannot be cured, but symptoms usually can be controlled with treatment and other strategies. A treatment plan might include:  Controlling the itching and scratching.   Use over-the-counter antihistamines as directed for itching. This is especially useful at night when the itching tends to be worse.   Use over-the-counter steroid creams as directed for itching.   Avoid scratching. Scratching makes the rash and itching worse. It may also result in a skin infection (impetigo) due to a break in the skin caused by scratching.   Keeping the skin well moisturized with creams every day. This will seal in moisture and help prevent dryness. Lotions that contain alcohol and water should be avoided because they can dry the skin.   Limiting exposure to things that you are sensitive or allergic to (allergens).   Recognizing situations that cause stress.   Developing a plan to manage stress.  HOME CARE INSTRUCTIONS   Only take over-the-counter or  prescription medicines as directed by your health care provider.   Do not use anything on the skin without checking with your health care provider.   Keep baths or showers short (5 minutes) in warm (not hot) water. Use mild cleansers for bathing. These should be unscented. You may add nonperfumed bath oil to the bath water. It is best to avoid soap and bubble bath.   Immediately after a bath or shower, when the skin is still damp, apply a moisturizing ointment to the entire body. This ointment should be a petroleum ointment. This will seal in moisture and help prevent dryness. The thicker the ointment, the better. These should be unscented.   Keep fingernails cut short. Children with eczema may need to wear soft gloves or mittens at night after applying an ointment.   Dress in clothes made of cotton or cotton blends. Dress lightly, because heat increases itching.   A child with eczema should stay away from anyone with fever blisters or cold sores. The virus that causes fever blisters (herpes simplex) can cause a serious skin infection in children with eczema. SEEK MEDICAL CARE IF:   Your itching interferes with sleep.   Your rash gets worse or is not better within 1 week after starting treatment.   You see pus or soft yellow scabs in the rash area.   You have a fever.   You have a rash flare-up after contact with someone who has fever blisters.  Document Released: 01/17/2000 Document Revised: 11/09/2012 Document Reviewed: 08/22/2012 [employer] Patient Information 2015 [employer]. This information   is not intended to replace advice given to you by your health care provider. Make sure you discuss any questions you have with your health care provider.  

## 2014-06-18 ENCOUNTER — Telehealth: Payer: Self-pay | Admitting: General Practice

## 2014-06-18 NOTE — Telephone Encounter (Signed)
Patient calling to request medication refill for hydrOXYzine (ATARAX/VISTARIL) 10 MG tablet  This medication was filled by a Provider at the ED. Please assist

## 2014-06-20 ENCOUNTER — Ambulatory Visit: Payer: Self-pay | Admitting: Family Medicine

## 2014-06-20 ENCOUNTER — Other Ambulatory Visit: Payer: Self-pay | Admitting: Family Medicine

## 2014-06-20 MED ORDER — HYDROXYZINE HCL 10 MG PO TABS: 10.0000 mg | ORAL_TABLET | Freq: Three times a day (TID) | ORAL | Status: DC | PRN

## 2014-06-20 NOTE — Telephone Encounter (Signed)
Patient requesting hydroxyzine refill. This medication was prescribed in ED, not by Dr. Venetia NightAmao.

## 2014-06-20 NOTE — Telephone Encounter (Signed)
Done

## 2014-06-21 NOTE — Telephone Encounter (Signed)
Patient aware of hydroxyzine refill sent to pharmacy.

## 2014-06-27 ENCOUNTER — Ambulatory Visit: Payer: Self-pay | Admitting: Family Medicine

## 2014-07-09 ENCOUNTER — Ambulatory Visit: Payer: Self-pay | Attending: Internal Medicine

## 2014-07-17 ENCOUNTER — Other Ambulatory Visit: Payer: Self-pay | Admitting: Family Medicine

## 2014-09-28 ENCOUNTER — Encounter (HOSPITAL_COMMUNITY): Payer: Self-pay | Admitting: Emergency Medicine

## 2014-09-28 ENCOUNTER — Emergency Department (HOSPITAL_COMMUNITY)
Admission: EM | Admit: 2014-09-28 | Discharge: 2014-09-28 | Disposition: A | Discharge status: Home / Self Care | Payer: Self-pay | Attending: Emergency Medicine | Admitting: Emergency Medicine

## 2014-09-28 DIAGNOSIS — Z7952 Long term (current) use of systemic steroids: Secondary | ICD-10-CM | POA: Insufficient documentation

## 2014-09-28 DIAGNOSIS — E876 Hypokalemia: Secondary | ICD-10-CM

## 2014-09-28 DIAGNOSIS — Z79899 Other long term (current) drug therapy: Secondary | ICD-10-CM | POA: Insufficient documentation

## 2014-09-28 DIAGNOSIS — L309 Dermatitis, unspecified: Secondary | ICD-10-CM

## 2014-09-28 DIAGNOSIS — Z720 Tobacco use: Secondary | ICD-10-CM | POA: Insufficient documentation

## 2014-09-28 DIAGNOSIS — J45909 Unspecified asthma, uncomplicated: Secondary | ICD-10-CM | POA: Insufficient documentation

## 2014-09-28 DIAGNOSIS — R6883 Chills (without fever): Secondary | ICD-10-CM

## 2014-09-28 DIAGNOSIS — I10 Essential (primary) hypertension: Secondary | ICD-10-CM | POA: Insufficient documentation

## 2014-09-28 LAB — BASIC METABOLIC PANEL
ANION GAP: 8 (ref 5–15)
BUN: <5 mg/dL — ABNORMAL LOW (ref 6–20)
CO2: 23 mmol/L (ref 22–32)
CREATININE: 0.88 mg/dL (ref 0.44–1.00)
Calcium: 8.1 mg/dL — ABNORMAL LOW (ref 8.9–10.3)
Chloride: 110 mmol/L (ref 101–111)
GFR calc non Af Amer: >60 mL/min (ref >60)
GLUCOSE: 82 mg/dL (ref 65–99)
POTASSIUM: 3.3 mmol/L — AB (ref 3.5–5.1)
SODIUM: 141 mmol/L (ref 135–145)

## 2014-09-28 LAB — URINE MICROSCOPIC-ADD ON

## 2014-09-28 LAB — URINALYSIS, ROUTINE W REFLEX MICROSCOPIC
GLUCOSE, UA: NEGATIVE mg/dL
HGB URINE DIPSTICK: NEGATIVE
Ketones, ur: NEGATIVE mg/dL
Nitrite: NEGATIVE
PROTEIN: NEGATIVE mg/dL
Specific Gravity, Urine: 1.031 — ABNORMAL HIGH (ref 1.005–1.030)
Urobilinogen, UA: 1 mg/dL (ref 0.0–1.0)
pH: 5 (ref 5.0–8.0)

## 2014-09-28 LAB — CBC WITH DIFFERENTIAL/PLATELET
Basophils Absolute: 0 10*3/uL (ref 0.0–0.1)
Basophils Relative: 0 % (ref 0–1)
EOS ABS: 1 10*3/uL — AB (ref 0.0–0.7)
EOS PCT: 13 % — AB (ref 0–5)
HCT: 37.9 % (ref 36.0–46.0)
HEMOGLOBIN: 13 g/dL (ref 12.0–15.0)
LYMPHS ABS: 2.2 10*3/uL (ref 0.7–4.0)
Lymphocytes Relative: 27 % (ref 12–46)
MCH: 29 pg (ref 26.0–34.0)
MCHC: 34.3 g/dL (ref 30.0–36.0)
MCV: 84.4 fL (ref 78.0–100.0)
MONO ABS: 0.5 10*3/uL (ref 0.1–1.0)
MONOS PCT: 6 % (ref 3–12)
Neutro Abs: 4.4 10*3/uL (ref 1.7–7.7)
Neutrophils Relative %: 54 % (ref 43–77)
PLATELETS: 430 10*3/uL — AB (ref 150–400)
RBC: 4.49 MIL/uL (ref 3.87–5.11)
RDW: 17 % — ABNORMAL HIGH (ref 11.5–15.5)
WBC: 8.1 10*3/uL (ref 4.0–10.5)

## 2014-09-28 MED ORDER — TRIAMCINOLONE ACETONIDE 0.1 % EX LOTN: 1.0000 "application " | TOPICAL_LOTION | Freq: Three times a day (TID) | CUTANEOUS | Status: DC

## 2014-09-28 MED ORDER — HYDROXYZINE HCL 25 MG PO TABS
25.0000 mg | ORAL_TABLET | Freq: Once | ORAL | Status: AC
  Administered 2014-09-28: 25 mg via ORAL
  Filled 2014-09-28: qty 1

## 2014-09-28 MED ORDER — POTASSIUM CHLORIDE CRYS ER 20 MEQ PO TBCR
40.0000 meq | EXTENDED_RELEASE_TABLET | Freq: Once | ORAL | Status: AC
  Administered 2014-09-28: 40 meq via ORAL
  Filled 2014-09-28: qty 2

## 2014-09-28 NOTE — ED Notes (Signed)
Delay on lab draw, pt in bathroom 

## 2014-09-28 NOTE — Discharge Instructions (Signed)
Do not apply this lotion to the face and neck. Hypokalemia Hypokalemia means that the amount of potassium in the blood is lower than normal.Potassium is a chemical, called an electrolyte, that helps regulate the amount of fluid in the body. It also stimulates muscle contraction and helps nerves function properly.Most of the body's potassium is inside of cells, and only a very small amount is in the blood. Because the amount in the blood is so small, minor changes can be life-threatening. CAUSES  Antibiotics.  Diarrhea or vomiting.  Using laxatives too much, which can cause diarrhea.  Chronic kidney disease.  Water pills (diuretics).  Eating disorders (bulimia).  Low magnesium level.  Sweating a lot. SIGNS AND SYMPTOMS  Weakness.  Constipation.  Fatigue.  Muscle cramps.  Mental confusion.  Skipped heartbeats or irregular heartbeat (palpitations).  Tingling or numbness. DIAGNOSIS  Your health care provider can diagnose hypokalemia with blood tests. In addition to checking your potassium level, your health care provider may also check other lab tests. TREATMENT Hypokalemia can be treated with potassium supplements taken by mouth or adjustments in your current medicines. If your potassium level is very low, you may need to get potassium through a vein (IV) and be monitored in the hospital. A diet high in potassium is also helpful. Foods high in potassium are:  Nuts, such as peanuts and pistachios.  Seeds, such as sunflower seeds and pumpkin seeds.  Peas, lentils, and lima beans.  Whole grain and bran cereals and breads.  Fresh fruit and vegetables, such as apricots, avocado, bananas, cantaloupe, kiwi, oranges, tomatoes, asparagus, and potatoes.  Orange and tomato juices.  Red meats.  Fruit yogurt. HOME CARE INSTRUCTIONS  Take all medicines as prescribed by your health care provider.  Maintain a healthy diet by including nutritious food, such as fruits,  vegetables, nuts, whole grains, and lean meats.  If you are taking a laxative, be sure to follow the directions on the label. SEEK MEDICAL CARE IF:  Your weakness gets worse.  You feel your heart pounding or racing.  You are vomiting or having diarrhea.  You are diabetic and having trouble keeping your blood glucose in the normal range. SEEK IMMEDIATE MEDICAL CARE IF:  You have chest pain, shortness of breath, or dizziness.  You are vomiting or having diarrhea for more than 2 days.  You faint. MAKE SURE YOU:   Understand these instructions.  Will watch your condition.  Will get help right away if you are not doing well or get worse. Document Released: 01/19/2005 Document Revised: 11/09/2012 Document Reviewed: 07/22/2012 [facility] Patient Information 2015 [place], [place]. This information is not intended to replace advice given to you by your health care provider. Make sure you discuss any questions you have with your health care provider.   Eczema Eczema, also called atopic dermatitis, is a skin disorder that causes inflammation of the skin. It causes a red rash and dry, scaly skin. The skin becomes very itchy. Eczema is generally worse during the cooler winter months and often improves with the warmth of summer. Eczema usually starts showing signs in infancy. Some children outgrow eczema, but it may last through adulthood.  CAUSES  The exact cause of eczema is not known, but it appears to run in families. People with eczema often have a family history of eczema, allergies, asthma, or hay fever. Eczema is not contagious. Flare-ups of the condition may be caused by:   Contact with something you are sensitive or allergic to.  Stress. SIGNS AND SYMPTOMS  Dry, scaly skin.   Red, itchy rash.   Itchiness. This may occur before the skin rash and may be very intense.  DIAGNOSIS  The diagnosis of eczema is usually made based on symptoms and medical history. TREATMENT    Eczema cannot be cured, but symptoms usually can be controlled with treatment and other strategies. A treatment plan might include:  Controlling the itching and scratching.   Use over-the-counter antihistamines as directed for itching. This is especially useful at night when the itching tends to be worse.   Use over-the-counter steroid creams as directed for itching.   Avoid scratching. Scratching makes the rash and itching worse. It may also result in a skin infection (impetigo) due to a break in the skin caused by scratching.   Keeping the skin well moisturized with creams every day. This will seal in moisture and help prevent dryness. Lotions that contain alcohol and water should be avoided because they can dry the skin.   Limiting exposure to things that you are sensitive or allergic to (allergens).   Recognizing situations that cause stress.   Developing a plan to manage stress.  HOME CARE INSTRUCTIONS   Only take over-the-counter or prescription medicines as directed by your health care provider.   Do not use anything on the skin without checking with your health care provider.   Keep baths or showers short (5 minutes) in warm (not hot) water. Use mild cleansers for bathing. These should be unscented. You may add nonperfumed bath oil to the bath water. It is best to avoid soap and bubble bath.   Immediately after a bath or shower, when the skin is still damp, apply a moisturizing ointment to the entire body. This ointment should be a petroleum ointment. This will seal in moisture and help prevent dryness. The thicker the ointment, the better. These should be unscented.   Keep fingernails cut short. Children with eczema may need to wear soft gloves or mittens at night after applying an ointment.   Dress in clothes made of cotton or cotton blends. Dress lightly, because heat increases itching.   A child with eczema should stay away from anyone with fever blisters or  cold sores. The virus that causes fever blisters (herpes simplex) can cause a serious skin infection in children with eczema. SEEK MEDICAL CARE IF:   Your itching interferes with sleep.   Your rash gets worse or is not better within 1 week after starting treatment.   You see pus or soft yellow scabs in the rash area.   You have a fever.   You have a rash flare-up after contact with someone who has fever blisters.  Document Released: 01/17/2000 Document Revised: 11/09/2012 Document Reviewed: 08/22/2012 [facility] Patient Information 2015 [place], [place]. This information is not intended to replace advice given to you by your health care provider. Make sure you discuss any questions you have with your health care provider.

## 2014-09-28 NOTE — ED Notes (Signed)
Bed: WA21 Expected date:  Expected time:  Means of arrival:  Comments: ems 

## 2014-09-28 NOTE — ED Notes (Signed)
Stuck pt twice, not enough blood return.  Notified nurse and PA

## 2014-09-28 NOTE — ED Notes (Signed)
Per [facility] pt called out complaining of being cold. Pt is currently bundled up in clothes and blankets. Complaining of being slightly nauseated and is scratching everywhere, itching everywhere. Also complaining of a headache.

## 2014-09-28 NOTE — ED Provider Notes (Signed)
CSN: [id number]     Arrival date & time 09/28/14  1216 History   First MD Initiated Contact with Patient 09/28/14 1222     Chief Complaint  Patient presents with  . Chills  . Headache  . Nausea  . Pruritis     (Consider location/radiation/quality/duration/timing/severity/associated sxs/prior Treatment) HPI   Patient name this 59 year old female with a past medical history of hypertension, asthma, and diffuse eczema with alopecia. Patient has been out of her medications for treatment for several days. The patient is complaining of severe itching. She denies any open sores. Patient complains of chills and myalgias for the past several days. She complains of some mild headache. She denies unilateral headache, throbbing, sudden onset, visual disturbances or fever. Patient states "I feel like I am getting sick." She denies any urinary symptoms, cough.  Past Medical History  Diagnosis Date  . Hypertension   . Asthma   . Eczema    History reviewed. No pertinent past surgical history. Family History  Problem Relation Age of Onset  . Cancer Mother   . Heart disease Mother   . Heart disease Father    Social History  Substance Use Topics  . Smoking status: Current Every Day Smoker -- 0.50 packs/day    Types: Cigarettes  . Smokeless tobacco: None  . Alcohol Use: Yes     Comment: 1 beer daily after work   OB History    No data available     Review of Systems  Ten systems reviewed and are negative for acute change, except as noted in the HPI.    Allergies  Review of patient's allergies indicates no known allergies.  Home Medications   Prior to Admission medications   Medication Sig Start Date End Date Taking? Authorizing Provider  albuterol (PROVENTIL HFA;VENTOLIN HFA) 108 (90 BASE) MCG/ACT inhaler Inhale 2 puffs into the lungs every 6 (six) hours as needed for wheezing. 06/13/14  Yes Jaclyn Shaggy, MD  amLODipine (NORVASC) 10 MG tablet Take 1 tablet (10 mg total) by mouth  daily. 06/13/14  Yes Jaclyn Shaggy, MD  Aspirin-Salicylamide-Caffeine (BC HEADACHE POWDER PO) Take 1 each by mouth every 8 (eight) hours as needed (pain).   Yes Historical Provider, MD  clobetasol (TEMOVATE) 0.05 % GEL Apply 1 each topically 2 (two) times daily. 06/13/14  Yes Jaclyn Shaggy, MD  hydrochlorothiazide (HYDRODIURIL) 25 MG tablet Take 1 tablet (25 mg total) by mouth daily. 06/13/14  Yes Jaclyn Shaggy, MD  loratadine (CLARITIN) 10 MG tablet Take 10 mg by mouth daily as needed for allergies.    Yes Historical Provider, MD  neomycin-bacitracin-polymyxin (NEOSPORIN) ointment Apply 1 application topically every 12 (twelve) hours as needed for wound care. apply to eye   Yes Historical Provider, MD  hydrOXYzine (ATARAX/VISTARIL) 10 MG tablet Take 1 tablet (10 mg total) by mouth 3 (three) times daily as needed. Patient not taking: Reported on 09/28/2014 06/20/14   Jaclyn Shaggy, MD  potassium chloride SA (K-DUR,KLOR-CON) 20 MEQ tablet Take 1 tablet (20 mEq total) by mouth daily. Patient not taking: Reported on 06/13/2014 07/19/12   Maretta Bees, MD  predniSONE (DELTASONE) 10 MG tablet Take 4 tablets daily x 3 days, then 3 tablets daily x 3 days, then 2 tablets daily x 2 days. Patient not taking: Reported on 09/28/2014 05/28/14   Felicie Morn, NP  Selenium Sulfide 2.25 % SHAM Apply 1 each topically 2 (two) times a week. Patient not taking: Reported on 09/28/2014 06/13/14   Jaclyn Shaggy, MD  Skin Protectants, Misc. (EUCERIN) cream Apply topically as needed for dry skin. Patient not taking: Reported on 06/13/2014 05/28/14   Felicie Morn, NP  triamcinolone lotion (KENALOG) 0.1 % Apply 1 application topically 3 (three) times daily. 09/28/14   Lacye Mccarn, PA-C   BP 146/88 mmHg  Pulse 81  Temp(Src) 97.8 F (36.6 C) (Oral)  Resp 18  SpO2 98% Physical Exam  Constitutional: She is oriented to person, place, and time. She appears well-developed and well-nourished. No distress.  HENT:  Head: Normocephalic and  atraumatic.  Eyes: Conjunctivae are normal. No scleral icterus.  Neck: Normal range of motion.  Cardiovascular: Normal rate, regular rhythm and normal heart sounds.  Exam reveals no gallop and no friction rub.   No murmur heard. Pulmonary/Chest: Effort normal and breath sounds normal. No respiratory distress.  Abdominal: Soft. Bowel sounds are normal. She exhibits no distension and no mass. There is no tenderness. There is no guarding.  Neurological: She is alert and oriented to person, place, and time.  Skin: Skin is warm and dry. She is not diaphoretic.   Xerotic, scaling skin Areas of hair loss on scalp. No draining sores or signs of cellulitis  Nursing note and vitals reviewed.   ED Course  Procedures (including critical care time) Labs Review Labs Reviewed  BASIC METABOLIC PANEL - Abnormal; Notable for the following:    Potassium 3.3 (*)    BUN <5 (*)    Calcium 8.1 (*)    All other components within normal limits  CBC WITH DIFFERENTIAL/PLATELET - Abnormal; Notable for the following:    RDW 17.0 (*)    Platelets 430 (*)    Eosinophils Relative 13 (*)    Eosinophils Absolute 1.0 (*)    All other components within normal limits  URINALYSIS, ROUTINE W REFLEX MICROSCOPIC (NOT AT [facility]) - Abnormal; Notable for the following:    Color, Urine AMBER (*)    APPearance TURBID (*)    Specific Gravity, Urine 1.031 (*)    Bilirubin Urine SMALL (*)    Leukocytes, UA TRACE (*)    All other components within normal limits  URINE MICROSCOPIC-ADD ON - Abnormal; Notable for the following:    Squamous Epithelial / LPF MANY (*)    All other components within normal limits    Imaging Review No results found. I have personally reviewed and evaluated these images and lab results as part of my medical decision-making.   EKG Interpretation None      MDM   Final diagnoses:  Eczema  Hypokalemia  Chills (without fever)    Patient with contaminated urine, no evidence of infection. BMP  shows hypokalemia. CBC shows eosinophilia, which is consistent with her history of eczema. We'll discharge the patient with topical steroid lotion. [facility] Potassium repleted orally. There is no evidence of systemic infection, anemia or other cause of her cold intolerance.    Arthor Captain, PA-C 09/29/14 1055  Derwood Kaplan, MD 10/05/14 [phone]

## 2014-09-28 NOTE — ED Notes (Addendum)
Pt was unable to give urine sample while in restroom 

## 2014-11-21 ENCOUNTER — Other Ambulatory Visit: Payer: Self-pay | Admitting: Family Medicine

## 2014-12-19 ENCOUNTER — Other Ambulatory Visit: Payer: Self-pay | Admitting: Family Medicine

## 2014-12-21 NOTE — Telephone Encounter (Signed)
Okay to refill? 

## 2015-01-09 ENCOUNTER — Other Ambulatory Visit: Payer: Self-pay | Admitting: Family Medicine

## 2015-01-09 ENCOUNTER — Encounter: Payer: Self-pay | Admitting: Family Medicine

## 2015-01-09 ENCOUNTER — Ambulatory Visit: Payer: Self-pay | Attending: Family Medicine | Admitting: Family Medicine

## 2015-01-09 VITALS — BP 119/72 | HR 80 | Temp 97.5°F | Resp 18 | Ht 67.0 in | Wt 153.2 lb

## 2015-01-09 DIAGNOSIS — R634 Abnormal weight loss: Secondary | ICD-10-CM | POA: Insufficient documentation

## 2015-01-09 DIAGNOSIS — Z0000 Encounter for general adult medical examination without abnormal findings: Secondary | ICD-10-CM | POA: Insufficient documentation

## 2015-01-09 DIAGNOSIS — R358 Other polyuria: Secondary | ICD-10-CM | POA: Insufficient documentation

## 2015-01-09 DIAGNOSIS — Z87891 Personal history of nicotine dependence: Secondary | ICD-10-CM | POA: Insufficient documentation

## 2015-01-09 LAB — CBC
HCT: 41.2 % (ref 36.0–46.0)
Hemoglobin: 13.2 g/dL (ref 12.0–15.0)
MCH: 28.6 pg (ref 26.0–34.0)
MCHC: 32 g/dL (ref 30.0–36.0)
MCV: 89.2 fL (ref 78.0–100.0)
MPV: 9.8 fL (ref 8.6–12.4)
Platelets: 404 10*3/uL — ABNORMAL HIGH (ref 150–400)
RBC: 4.62 MIL/uL (ref 3.87–5.11)
RDW: 14.6 % (ref 11.5–15.5)
WBC: 10.1 10*3/uL (ref 4.0–10.5)

## 2015-01-09 LAB — GLUCOSE, POCT (MANUAL RESULT ENTRY): POC Glucose: 86 mg/dl (ref 70–99)

## 2015-01-09 MED ORDER — HYDROXYZINE HCL 10 MG PO TABS: 10.0000 mg | ORAL_TABLET | Freq: Three times a day (TID) | ORAL | Status: DC | PRN

## 2015-01-09 MED ORDER — LORATADINE 10 MG PO TABS: 10.0000 mg | ORAL_TABLET | Freq: Every day | ORAL | Status: DC | PRN

## 2015-01-09 NOTE — Progress Notes (Signed)
Patient here for dehydration and fatigue.  Patient complains of being thirsty and fatigued for the past month. Patient has lost 32 lb within a 6 month span. Patient's CBG was 86.  Patient needs refills on amlodipine, HCTZ, hydroxyzine, and Claritin.

## 2015-01-09 NOTE — Patient Instructions (Signed)
It was a pleasure to see you today.   For the weight loss, we checked your fingerstick glucose in the office and it is normal today.  Further lab testing being done today to look for a cause of the weight loss and fatigue.   Follow up here in 2 to 3 weeks.

## 2015-01-09 NOTE — Progress Notes (Signed)
   Subjective:    [name], female    DOB: [birth year], 57 y.o.   MRN: [id number]  HPI C/o weight loss, fatigue over past 6 months. Forces herself to eat. No N/V/D, no blood in stool. No fevers or sweats.  Has forced her self to eat 2 meals and a day and has regained slight amount of weight. Reports polyuria and polydipsia.  Worried about DM, half-sister recently diagnosed with this.   Social Hx; Smokes 1/4 ppd cigarettes, drinks 2-3 beers and one drink brandy a week.  No other substances. Used to smoke 1ppd. Patient had normal mammogram in 2013, never had CRC screening.   Family Hx; Sister died 2011 from lung cancer, mother died in the 11980s of cancer NOS.   ROS: See above.     Review of Systems     Objective:   Physical Exam General: cachexia, no acute distress HEENT Neck supple, no thyroid tenderness or nodularity. PERRL EOMI. Mildly dry MM. No cervical adenopathy.  COR Reg S1S2, no extra sounds PULM Clear bilaterally, no rales or wheezes ABD Soft, nontender, nondistended. No masses or megaly. No fluid wave noted.  EXTS Dry skin trace pitting edema.        Assessment & Plan:

## 2015-01-10 LAB — COMPREHENSIVE METABOLIC PANEL
ALK PHOS: 55 U/L (ref 33–130)
ALT: 10 U/L (ref 6–29)
AST: 19 U/L (ref 10–35)
Albumin: 3.7 g/dL (ref 3.6–5.1)
BUN: 13 mg/dL (ref 7–25)
CALCIUM: 10 mg/dL (ref 8.6–10.4)
CHLORIDE: 106 mmol/L (ref 98–110)
CO2: 24 mmol/L (ref 20–31)
Creat: 0.76 mg/dL (ref 0.50–1.05)
Glucose, Bld: 66 mg/dL (ref 65–99)
POTASSIUM: 5.2 mmol/L (ref 3.5–5.3)
Sodium: 145 mmol/L (ref 135–146)
TOTAL PROTEIN: 7.5 g/dL (ref 6.1–8.1)
Total Bilirubin: 0.3 mg/dL (ref 0.2–1.2)

## 2015-01-10 LAB — URINALYSIS, COMPLETE
BILIRUBIN URINE: NEGATIVE
Bacteria, UA: NONE SEEN [HPF]
Casts: NONE SEEN [LPF]
Crystals: NONE SEEN [HPF]
GLUCOSE, UA: NEGATIVE
HGB URINE DIPSTICK: NEGATIVE
Ketones, ur: NEGATIVE
LEUKOCYTES UA: NEGATIVE
NITRITE: NEGATIVE
PH: 5.5 (ref 5.0–8.0)
PROTEIN: NEGATIVE
SPECIFIC GRAVITY, URINE: 1.024 (ref 1.001–1.035)
YEAST: NONE SEEN [HPF]

## 2015-01-10 LAB — TSH: TSH: 0.693 u[IU]/mL (ref 0.350–4.500)

## 2015-01-10 LAB — HIV ANTIBODY (ROUTINE TESTING W REFLEX): HIV 1&2 Ab, 4th Generation: NONREACTIVE

## 2015-02-11 ENCOUNTER — Ambulatory Visit: Payer: Self-pay | Admitting: Family Medicine

## 2015-02-15 ENCOUNTER — Ambulatory Visit: Payer: Self-pay | Attending: Family Medicine | Admitting: Family Medicine

## 2015-02-15 ENCOUNTER — Encounter: Payer: Self-pay | Admitting: Family Medicine

## 2015-02-15 ENCOUNTER — Telehealth: Payer: Self-pay | Admitting: Clinical

## 2015-02-15 VITALS — BP 145/97 | HR 93 | Temp 97.7°F | Resp 16 | Ht 66.5 in | Wt 147.0 lb

## 2015-02-15 DIAGNOSIS — F17210 Nicotine dependence, cigarettes, uncomplicated: Secondary | ICD-10-CM | POA: Insufficient documentation

## 2015-02-15 DIAGNOSIS — F17200 Nicotine dependence, unspecified, uncomplicated: Secondary | ICD-10-CM

## 2015-02-15 DIAGNOSIS — L209 Atopic dermatitis, unspecified: Secondary | ICD-10-CM

## 2015-02-15 DIAGNOSIS — Z79899 Other long term (current) drug therapy: Secondary | ICD-10-CM | POA: Insufficient documentation

## 2015-02-15 DIAGNOSIS — R0982 Postnasal drip: Secondary | ICD-10-CM | POA: Insufficient documentation

## 2015-02-15 DIAGNOSIS — Z6823 Body mass index (BMI) 23.0-23.9, adult: Secondary | ICD-10-CM | POA: Insufficient documentation

## 2015-02-15 DIAGNOSIS — L309 Dermatitis, unspecified: Secondary | ICD-10-CM | POA: Insufficient documentation

## 2015-02-15 DIAGNOSIS — J4520 Mild intermittent asthma, uncomplicated: Secondary | ICD-10-CM

## 2015-02-15 DIAGNOSIS — Z7982 Long term (current) use of aspirin: Secondary | ICD-10-CM | POA: Insufficient documentation

## 2015-02-15 DIAGNOSIS — J069 Acute upper respiratory infection, unspecified: Secondary | ICD-10-CM

## 2015-02-15 DIAGNOSIS — I10 Essential (primary) hypertension: Secondary | ICD-10-CM

## 2015-02-15 MED ORDER — PREDNISONE 10 MG PO TABS: 10.0000 mg | ORAL_TABLET | Freq: Every day | ORAL | Status: DC

## 2015-02-15 MED ORDER — AMLODIPINE BESYLATE 10 MG PO TABS: 10.0000 mg | ORAL_TABLET | Freq: Every day | ORAL | Status: DC

## 2015-02-15 MED ORDER — LORATADINE 10 MG PO TABS: 10.0000 mg | ORAL_TABLET | Freq: Every day | ORAL | Status: DC | PRN

## 2015-02-15 MED ORDER — CLOBETASOL PROPIONATE 0.05 % EX GEL: 1.0000 | Freq: Two times a day (BID) | CUTANEOUS | Status: DC

## 2015-02-15 MED ORDER — AMOXICILLIN 500 MG PO CAPS: 500.0000 mg | ORAL_CAPSULE | Freq: Three times a day (TID) | ORAL | Status: DC

## 2015-02-15 MED ORDER — ALBUTEROL SULFATE HFA 108 (90 BASE) MCG/ACT IN AERS: 2.0000 | INHALATION_SPRAY | Freq: Four times a day (QID) | RESPIRATORY_TRACT | Status: DC | PRN

## 2015-02-15 MED FILL — ?PREDNISONE 10 MG TABLET: 10 | 5 days supply | Qty: 5 | Fill #0

## 2015-02-15 MED FILL — VENTOLIN HFA 90 MCG INHALER: 108 (90 BAS | 30 days supply | Qty: 18 | Fill #0

## 2015-02-15 MED FILL — AMLODIPINE BESYLATE 10 MG T: 10 | 30 days supply | Qty: 30 | Fill #0

## 2015-02-15 MED FILL — CLOBETASOL 0.05% GEL: 0.05 | 30 days supply | Qty: 60 | Fill #0

## 2015-02-15 MED FILL — AMOXICILLIN 500 MG CAPSULE: 500 | 10 days supply | Qty: 30 | Fill #0

## 2015-02-15 MED FILL — LORATADINE 10 MG TABLET: 10 | 30 days supply | Qty: 30 | Fill #0

## 2015-02-15 NOTE — Telephone Encounter (Signed)
Attempt to f/u with patient, left HIPPA-compliant message to [facility] [place] [facility] [phone].

## 2015-02-15 NOTE — Progress Notes (Signed)
Patient's here for fatigue and experiencing chills, no bodyaches, right knee pain and right shoulder pain. Rating knee and shoulder pain at 7/10.   Patient states that her ezcema has gotten worse, with itchiness.  Requesting refill of Potassium chloride, Prednisone, clobetasol.  Patient asking about another cream for her eczema.

## 2015-02-15 NOTE — Telephone Encounter (Signed)
Attempt to f/u with pt, left HIPPA-compliant message [facility] [phone].

## 2015-02-15 NOTE — Patient Instructions (Signed)
STOP POTASSIUM CHLORIDE TABLETS AS YOUR PREVIOUSLY LOW POTASSIUM WAS SECONDARY TO YOUR TAKING HYDROCHLOROTHIAZIDE AND MOST RECENT POTASSIUM WAS NORMAL AT 5.2 LAST MONTH. WE WILL REPEAT YOUR POTASSIUM IN 2 WEEKS.

## 2015-02-15 NOTE — Progress Notes (Signed)
Subjective:  [name], female    DOB: 03-08-1955  Age: 60 y.o. MRN: [id number]  CC: Fatigue   HPI Marie Woods is a 60 year old female with a history of hypertension, eczema, asthma who comes in to the clinic for follow-up visit today. Her blood pressure is elevated and she attributes this to the fact that she was taken off her antihypertensives that her last office visit because her blood pressure was controlled. She complains of extensive scaling of her skin and itching from her eczema and states her current cream is not working despite the use of adequate moisturizer like Vaseline.  She also complains of intermittent upper respiratory symptoms for the last few weeks; she has had rhinorrhea, cough which is productive of white sputum, body aches, postnasal drip but denies any fever and has not used any antibiotics recently.  She is doing well on her Proventil inhaler which she uses for her asthma and her symptoms are well controlled. Outpatient Prescriptions Prior to Visit  Medication Sig Dispense Refill  . Aspirin-Salicylamide-Caffeine (BC HEADACHE POWDER PO) Take 1 each by mouth every 8 (eight) hours as needed (pain).    . Selenium Sulfide 2.25 % SHAM Apply 1 each topically 2 (two) times a week. 180 mL 1  . Skin Protectants, Misc. (EUCERIN) cream Apply topically as needed for dry skin. 454 g 0  . albuterol (PROVENTIL HFA;VENTOLIN HFA) 108 (90 BASE) MCG/ACT inhaler Inhale 2 puffs into the lungs every 6 (six) hours as needed for wheezing. 1 each 1  . clobetasol (TEMOVATE) 0.05 % GEL Apply 1 each topically 2 (two) times daily. 1 each 2  . loratadine (CLARITIN) 10 MG tablet Take 1 tablet (10 mg total) by mouth daily as needed for allergies. 30 tablet 2  . predniSONE (DELTASONE) 10 MG tablet Take 4 tablets daily x 3 days, then 3 tablets daily x 3 days, then 2 tablets daily x 2 days. 25 tablet 0  . hydrOXYzine (ATARAX/VISTARIL) 10 MG tablet Take 1 tablet (10 mg total) by  mouth 3 (three) times daily as needed. 90 tablet 0  . neomycin-bacitracin-polymyxin (NEOSPORIN) ointment Apply 1 application topically every 12 (twelve) hours as needed for wound care. Reported on 02/15/2015    . amLODipine (NORVASC) 10 MG tablet TAKE 1 TABLET BY MOUTH DAILY (Patient not taking: Reported on 02/15/2015) 30 tablet 3  . hydrochlorothiazide (HYDRODIURIL) 25 MG tablet TAKE 1 TABLET BY MOUTH DAILY. (Patient not taking: Reported on 02/15/2015) 30 tablet 3  . potassium chloride SA (K-DUR,KLOR-CON) 20 MEQ tablet Take 1 tablet (20 mEq total) by mouth daily. (Patient not taking: Reported on 02/15/2015) 30 tablet 3  . triamcinolone lotion (KENALOG) 0.1 % Apply 1 application topically 3 (three) times daily. (Patient not taking: Reported on 02/15/2015) 60 mL 1   No facility-administered medications prior to visit.    ROS Review of Systems  Constitutional: Negative for activity change, appetite change and fatigue.  HENT: Positive for postnasal drip and rhinorrhea. Negative for congestion, sinus pressure and sore throat.        Rhinorrhea  Eyes: Negative for visual disturbance.  Respiratory: Negative for cough, chest tightness, shortness of breath and wheezing.   Cardiovascular: Negative for chest pain and palpitations.  Gastrointestinal: Negative for abdominal pain, constipation and abdominal distention.  Endocrine: Negative for polydipsia.  Genitourinary: Negative for dysuria and frequency.  Musculoskeletal: Positive for arthralgias.  Skin: Negative for rash.       See history of present illness  Neurological: Negative for tremors, light-headedness and numbness.  Hematological: Does not bruise/bleed easily.  Psychiatric/Behavioral: Negative for behavioral problems and agitation.    Objective:  BP 145/97 mmHg  Pulse 93  Temp(Src) 97.7 F (36.5 C) (Oral)  Resp 16  Ht 5' 6.5" (1.689 m)  Wt 147 lb (66.679 kg)  BMI 23.37 kg/m2  SpO2 97%  BP/Weight 02/15/2015 01/09/2015 09/28/2014    Systolic BP 145 119 146  Diastolic BP 97 72 88  Wt. (Lbs) 147 153.2 -  BMI 23.37 23.99 -    CMP Latest Ref Rng 01/09/2015 09/28/2014 07/16/2009  Glucose 65 - 99 mg/dL 66 82 82  BUN 7 - 25 mg/dL 13 <1(O) 13  Creatinine 0.50 - 1.05 mg/dL 1.09 6.04 5.40  Sodium 135 - 146 mmol/L 145 141 140  Potassium 3.5 - 5.3 mmol/L 5.2 3.3(L) 4.2  Chloride 98 - 110 mmol/L 106 110 101  CO2 20 - 31 mmol/L 24 23 26   Calcium 8.6 - 10.4 mg/dL 98.1 1.9(J) 9.9  Total Protein 6.1 - 8.1 g/dL 7.5 - 7.5  Total Bilirubin 0.2 - 1.2 mg/dL 0.3 - 1.0  Alkaline Phos 33 - 130 U/L 55 - 55  AST 10 - 35 U/L 19 - 28  ALT 6 - 29 U/L 10 - 19      Physical Exam Constitutional: She is oriented to person, place, and time. She appears well-developed and well-nourished. No distress.  HENT:  Head: Normocephalic.  Right Ear: External ear normal.  Left Ear: External ear normal.  Nose: Nose normal.  Mouth/Throat: Oropharynx is clear and moist.  Eyes: Conjunctivae and EOM are normal. Pupils are equal, round, and reactive to light.  Neck: Normal range of motion. No JVD present.  Cardiovascular: Normal rate, regular rhythm, normal heart sounds and intact distal pulses.  Exam reveals no gallop.   No murmur heard. Pulmonary/Chest: Effort normal and breath sounds normal. No respiratory distress. She has no wheezes. She has no rales. She exhibits no tenderness.  Abdominal: Soft. Bowel sounds are normal. She exhibits no distension and no mass. There is no tenderness.  Musculoskeletal: Normal range of motion. She exhibits no edema or tenderness.  Neurological: She is alert and oriented to person, place, and time.  Skin:  Extensive scaly skin which is generalized along with some xerosis. Recession of anterior hair line noted.  Psychiatric: She has a normal mood and affect.     Assessment & Plan:   1. TOBACCO ABUSE Spent 3 minutes counseling on cessation and she informs me she is working on cutting back as she now smokes 4-6  cigarettes per day  2. Essential hypertension Uncontrolled due to the fact the patient has been off both of her antihypertensives-amlodipine and hydrochlorothiazide because her blood pressure was normal. I will discontinue hydrochlorothiazide especially since it made her hypokalemic and will also discontinue potassium chloride tablets as her last potassium was normal at 5.2; repeat basic metabolic panel in 2 weeks Her blood pressure will be reassessed at her next visit. - amLODipine (NORVASC) 10 MG tablet; Take 1 tablet (10 mg total) by mouth daily.  Dispense: 30 tablet; Refill: 3 - Basic Metabolic Panel; Future  3. Dermatitis, atopic Uncontrolled on current regimen. We'll perform trial of oral prednisone and if symptoms persist she may need to be seen by a dermatologist - predniSONE (DELTASONE) 10 MG tablet; Take 1 tablet (10 mg total) by mouth daily with breakfast.  Dispense: 5 tablet; Refill: 0 - clobetasol (TEMOVATE) 0.05 % GEL; Apply 1  each topically 2 (two) times daily.  Dispense: 1 each; Refill: 2  4. Asthma, mild intermittent, uncomplicated Intermittent and controlled on current regimen - albuterol (PROVENTIL HFA;VENTOLIN HFA) 108 (90 Base) MCG/ACT inhaler; Inhale 2 puffs into the lungs every 6 (six) hours as needed for wheezing.  Dispense: 1 each; Refill: 1  5. Acute upper respiratory infection This could explain body aches and joint pains and so I will treat with amoxicillin given the long duration of symptoms. - loratadine (CLARITIN) 10 MG tablet; Take 1 tablet (10 mg total) by mouth daily as needed for allergies.  Dispense: 30 tablet; Refill: 2 - amoxicillin (AMOXIL) 500 MG capsule; Take 1 capsule (500 mg total) by mouth 3 (three) times daily.  Dispense: 30 capsule; Refill: 0   Meds ordered this encounter  Medications  . albuterol (PROVENTIL HFA;VENTOLIN HFA) 108 (90 Base) MCG/ACT inhaler    Sig: Inhale 2 puffs into the lungs every 6 (six) hours as needed for wheezing.     Dispense:  1 each    Refill:  1  . predniSONE (DELTASONE) 10 MG tablet    Sig: Take 1 tablet (10 mg total) by mouth daily with breakfast.    Dispense:  5 tablet    Refill:  0  . clobetasol (TEMOVATE) 0.05 % GEL    Sig: Apply 1 each topically 2 (two) times daily.    Dispense:  1 each    Refill:  2    60g  . amLODipine (NORVASC) 10 MG tablet    Sig: Take 1 tablet (10 mg total) by mouth daily.    Dispense:  30 tablet    Refill:  3  . loratadine (CLARITIN) 10 MG tablet    Sig: Take 1 tablet (10 mg total) by mouth daily as needed for allergies.    Dispense:  30 tablet    Refill:  2  . amoxicillin (AMOXIL) 500 MG capsule    Sig: Take 1 capsule (500 mg total) by mouth 3 (three) times daily.    Dispense:  30 capsule    Refill:  0    Follow-up: Return in about 1 month (around 03/18/2015) for Follow-up of eczema with PCP- Dr Hyman Hopes.   Jaclyn Shaggy MD

## 2015-02-20 ENCOUNTER — Telehealth: Payer: Self-pay | Admitting: Clinical

## 2015-02-20 NOTE — Telephone Encounter (Signed)
Marie Woods returned call from [facility], and will call in during office hours at [facility] to make an appointment to see financial counselors, and will consider making appointment to talk further.

## 2015-03-11 MED FILL — ?AMLODIPINE BESYLATE 10 MG: 10 | 30 days supply | Qty: 30 | Fill #1

## 2015-03-11 MED FILL — LORATADINE 10 MG TABLET: 10 | 30 days supply | Qty: 30 | Fill #1

## 2015-03-21 ENCOUNTER — Encounter: Payer: Self-pay | Admitting: Internal Medicine

## 2015-03-21 ENCOUNTER — Ambulatory Visit: Payer: Self-pay | Attending: Internal Medicine | Admitting: Internal Medicine

## 2015-03-21 VITALS — BP 126/88 | HR 86 | Temp 97.5°F | Resp 18 | Ht 66.5 in | Wt 151.6 lb

## 2015-03-21 DIAGNOSIS — I10 Essential (primary) hypertension: Secondary | ICD-10-CM

## 2015-03-21 DIAGNOSIS — L209 Atopic dermatitis, unspecified: Secondary | ICD-10-CM

## 2015-03-21 DIAGNOSIS — Z0000 Encounter for general adult medical examination without abnormal findings: Secondary | ICD-10-CM

## 2015-03-21 DIAGNOSIS — F17210 Nicotine dependence, cigarettes, uncomplicated: Secondary | ICD-10-CM | POA: Insufficient documentation

## 2015-03-21 DIAGNOSIS — L301 Dyshidrosis [pompholyx]: Secondary | ICD-10-CM

## 2015-03-21 DIAGNOSIS — F17200 Nicotine dependence, unspecified, uncomplicated: Secondary | ICD-10-CM

## 2015-03-21 DIAGNOSIS — Z23 Encounter for immunization: Secondary | ICD-10-CM | POA: Insufficient documentation

## 2015-03-21 DIAGNOSIS — L309 Dermatitis, unspecified: Secondary | ICD-10-CM | POA: Insufficient documentation

## 2015-03-21 DIAGNOSIS — J45909 Unspecified asthma, uncomplicated: Secondary | ICD-10-CM | POA: Insufficient documentation

## 2015-03-21 DIAGNOSIS — Z79899 Other long term (current) drug therapy: Secondary | ICD-10-CM | POA: Insufficient documentation

## 2015-03-21 DIAGNOSIS — Z7982 Long term (current) use of aspirin: Secondary | ICD-10-CM | POA: Insufficient documentation

## 2015-03-21 MED ORDER — HYDROXYZINE HCL 25 MG PO TABS: 25.0000 mg | ORAL_TABLET | Freq: Three times a day (TID) | ORAL | Status: DC | PRN

## 2015-03-21 MED ORDER — CLOBETASOL PROPIONATE 0.05 % EX OINT: 1.0000 "application " | TOPICAL_OINTMENT | Freq: Two times a day (BID) | CUTANEOUS | Status: DC

## 2015-03-21 MED ORDER — CLOBETASOL PROPIONATE 0.05 % EX GEL: 1.0000 | Freq: Two times a day (BID) | CUTANEOUS | Status: DC

## 2015-03-21 MED ORDER — PREDNISONE 10 MG PO TABS: 10.0000 mg | ORAL_TABLET | Freq: Every day | ORAL | Status: AC

## 2015-03-21 MED FILL — predniSONE 10 MG TABS: 10 | 10 days supply | Qty: 10 | Fill #0

## 2015-03-21 MED FILL — CLOBETASOL 0.05% GEL: 0.05 | 30 days supply | Qty: 60 | Fill #0

## 2015-03-21 MED FILL — ?HYDROXYZINE HCL 25 MG TAB: 25 | 20 days supply | Qty: 60 | Fill #0

## 2015-03-21 NOTE — Progress Notes (Signed)
Patient is here for FU Eczema  Patient denies pain at this time.  Patient states the prednisone and Hydroxyzine helped to clear her ezcema.   Patient would like her flu shot today.

## 2015-03-21 NOTE — Patient Instructions (Signed)
Eczema Eczema, also called atopic dermatitis, is a skin disorder that causes inflammation of the skin. It causes a red rash and dry, scaly skin. The skin becomes very itchy. Eczema is generally worse during the cooler winter months and often improves with the warmth of summer. Eczema usually starts showing signs in infancy. Some children outgrow eczema, but it may last through adulthood.  CAUSES  The exact cause of eczema is not known, but it appears to run in families. People with eczema often have a family history of eczema, allergies, asthma, or hay fever. Eczema is not contagious. Flare-ups of the condition may be caused by:   Contact with something you are sensitive or allergic to.   Stress. SIGNS AND SYMPTOMS  Dry, scaly skin.   Red, itchy rash.   Itchiness. This may occur before the skin rash and may be very intense.  DIAGNOSIS  The diagnosis of eczema is usually made based on symptoms and medical history. TREATMENT  Eczema cannot be cured, but symptoms usually can be controlled with treatment and other strategies. A treatment plan might include:  Controlling the itching and scratching.   Use over-the-counter antihistamines as directed for itching. This is especially useful at night when the itching tends to be worse.   Use over-the-counter steroid creams as directed for itching.   Avoid scratching. Scratching makes the rash and itching worse. It may also result in a skin infection (impetigo) due to a break in the skin caused by scratching.   Keeping the skin well moisturized with creams every day. This will seal in moisture and help prevent dryness. Lotions that contain alcohol and water should be avoided because they can dry the skin.   Limiting exposure to things that you are sensitive or allergic to (allergens).   Recognizing situations that cause stress.   Developing a plan to manage stress.  HOME CARE INSTRUCTIONS   Only take over-the-counter or  prescription medicines as directed by your health care provider.   Do not use anything on the skin without checking with your health care provider.   Keep baths or showers short (5 minutes) in warm (not hot) water. Use mild cleansers for bathing. These should be unscented. You may add nonperfumed bath oil to the bath water. It is best to avoid soap and bubble bath.   Immediately after a bath or shower, when the skin is still damp, apply a moisturizing ointment to the entire body. This ointment should be a petroleum ointment. This will seal in moisture and help prevent dryness. The thicker the ointment, the better. These should be unscented.   Keep fingernails cut short. Children with eczema may need to wear soft gloves or mittens at night after applying an ointment.   Dress in clothes made of cotton or cotton blends. Dress lightly, because heat increases itching.   A child with eczema should stay away from anyone with fever blisters or cold sores. The virus that causes fever blisters (herpes simplex) can cause a serious skin infection in children with eczema. SEEK MEDICAL CARE IF:   Your itching interferes with sleep.   Your rash gets worse or is not better within 1 week after starting treatment.   You see pus or soft yellow scabs in the rash area.   You have a fever.   You have a rash flare-up after contact with someone who has fever blisters.    This information is not intended to replace advice given to you by your health care   provider. Make sure you discuss any questions you have with your health care provider.   Document Released: 01/17/2000 Document Revised: 11/09/2012 Document Reviewed: 08/22/2012 Elsevier Interactive Patient Education 2016 [employer].  Steps to Quit Smoking  Smoking tobacco can be harmful to your health and can affect almost every organ in your body. Smoking puts you, and those around you, at risk for developing many serious chronic diseases.  Quitting smoking is difficult, but it is one of the best things that you can do for your health. It is never too late to quit. WHAT ARE THE BENEFITS OF QUITTING SMOKING? When you quit smoking, you lower your risk of developing serious diseases and conditions, such as:  Lung cancer or lung disease, such as COPD.  Heart disease.  Stroke.  Heart attack.  Infertility.  Osteoporosis and bone fractures. Additionally, symptoms such as coughing, wheezing, and shortness of breath may get better when you quit. You may also find that you get sick less often because your body is stronger at fighting off colds and infections. If you are pregnant, quitting smoking can help to reduce your chances of having a baby of low birth weight. HOW DO I GET READY TO QUIT? When you decide to quit smoking, create a plan to make sure that you are successful. Before you quit:  Pick a date to quit. Set a date within the next two weeks to give you time to prepare.  Write down the reasons why you are quitting. Keep this list in places where you will see it often, such as on your bathroom mirror or in your car or wallet.  Identify the people, places, things, and activities that make you want to smoke (triggers) and avoid them. Make sure to take these actions:  Throw away all cigarettes at home, at work, and in your car.  Throw away smoking accessories, such as Set designer.  Clean your car and make sure to empty the ashtray.  Clean your home, including curtains and carpets.  Tell your family, friends, and coworkers that you are quitting. Support from your loved ones can make quitting easier.  Talk with your health care provider about your options for quitting smoking.  Find out what treatment options are covered by your health insurance. WHAT STRATEGIES CAN I USE TO QUIT SMOKING?  Talk with your healthcare provider about different strategies to quit smoking. Some strategies include:  Quitting smoking  altogether instead of gradually lessening how much you smoke over a period of time. Research shows that quitting "cold [place]" is more successful than gradually quitting.  Attending in-person counseling to help you build problem-solving skills. You are more likely to have success in quitting if you attend several counseling sessions. Even short sessions of 10 minutes can be effective.  Finding resources and support systems that can help you to quit smoking and remain smoke-free after you quit. These resources are most helpful when you use them often. They can include:  Online chats with a Veterinary surgeon.  Telephone quitlines.  Printed Materials engineer.  Support groups or group counseling.  Text messaging programs.  Mobile phone applications.  Taking medicines to help you quit smoking. (If you are pregnant or breastfeeding, talk with your health care provider first.) Some medicines contain nicotine and some do not. Both types of medicines help with cravings, but the medicines that include nicotine help to relieve withdrawal symptoms. Your health care provider may recommend:  Nicotine patches, gum, or lozenges.  Nicotine inhalers or sprays.  Non-nicotine medicine that is taken by mouth. Talk with your health care provider about combining strategies, such as taking medicines while you are also receiving in-person counseling. Using these two strategies together makes you more likely to succeed in quitting than if you used either strategy on its own. If you are pregnant or breastfeeding, talk with your health care provider about finding counseling or other support strategies to quit smoking. Do not take medicine to help you quit smoking unless told to do so by your health care provider. WHAT THINGS CAN I DO TO MAKE IT EASIER TO QUIT? Quitting smoking might feel overwhelming at first, but there is a lot that you can do to make it easier. Take these important actions:  Reach out to your family  and friends and ask that they support and encourage you during this time. Call telephone quitlines, reach out to support groups, or work with a counselor for support.  Ask people who smoke to avoid smoking around you.  Avoid places that trigger you to smoke, such as bars, parties, or smoke-break areas at work.  Spend time around people who do not smoke.  Lessen stress in your life, because stress can be a smoking trigger for some people. To lessen stress, try:  Exercising regularly.  Deep-breathing exercises.  Yoga.  Meditating.  Performing a body scan. This involves closing your eyes, scanning your body from head to toe, and noticing which parts of your body are particularly tense. Purposefully relax the muscles in those areas.  Download or purchase mobile phone or tablet apps (applications) that can help you stick to your quit plan by providing reminders, tips, and encouragement. There are many free apps, such as QuitGuide from the [employer] developer for Disease Control and Prevention). You can find other support for quitting smoking (smoking cessation) through smokefree.gov and other websites. HOW WILL I FEEL WHEN I QUIT SMOKING? Within the first 24 hours of quitting smoking, you may start to feel some withdrawal symptoms. These symptoms are usually most noticeable 2-3 days after quitting, but they usually do not last beyond 2-3 weeks. Changes or symptoms that you might experience include:  Mood swings.  Restlessness, anxiety, or irritation.  Difficulty concentrating.  Dizziness.  Strong cravings for sugary foods in addition to nicotine.  Mild weight gain.  Constipation.  Nausea.  Coughing or a sore throat.  Changes in how your medicines work in your body.  A depressed mood.  Difficulty sleeping (insomnia). After the first 2-3 weeks of quitting, you may start to notice more positive results, such as:  Improved sense of smell and taste.  Decreased coughing and sore  throat.  Slower heart rate.  Lower blood pressure.  Clearer skin.  The ability to breathe more easily.  Fewer sick days. Quitting smoking is very challenging for most people. Do not get discouraged if you are not successful the first time. Some people need to make many attempts to quit before they achieve long-term success. Do your best to stick to your quit plan, and talk with your health care provider if you have any questions or concerns.   This information is not intended to replace advice given to you by your health care provider. Make sure you discuss any questions you have with your health care provider.   Document Released: 01/13/2001 Document Revised: 06/05/2014 Document Reviewed: 06/05/2014 Elsevier Interactive Patient Education [employer]

## 2015-03-21 NOTE — Progress Notes (Signed)
Marie Woods, is a 59 y.o. female  [id number]  [id number]  DOB - May 23, 1955  CC:  Chief Complaint  Patient presents with  . Follow-up    Eczema       HPI: Marie Woods is a 60 y.o. female here today for follow up visit for eczema. Patient has history of hypertension, asthma, COPD and eczema. Patient was seen in this office on 02/15/2015 for eczema and was given prednisone and clobetasol. Patient states moderate improvement initially but now worse. Patient eczema affecting bilateral arms and hands and bilateral lower legs. Patient has no other complaints today. Patient to return next week for Pap Smear and to schedule colonoscopy and mammogram. Patient has No headache, No chest pain, No abdominal pain - No Nausea, No new weakness tingling or numbness, No Cough - SOB. She smokes about 1/2 a pack of cigarette per day, drinks alcohol occasionally.  No Known Allergies Past Medical History  Diagnosis Date  . Hypertension   . Asthma   . Eczema    Current Outpatient Prescriptions on File Prior to Visit  Medication Sig Dispense Refill  . albuterol (PROVENTIL HFA;VENTOLIN HFA) 108 (90 Base) MCG/ACT inhaler Inhale 2 puffs into the lungs every 6 (six) hours as needed for wheezing. 1 each 1  . amLODipine (NORVASC) 10 MG tablet Take 1 tablet (10 mg total) by mouth daily. 30 tablet 3  . Aspirin-Salicylamide-Caffeine (BC HEADACHE POWDER PO) Take 1 each by mouth every 8 (eight) hours as needed (pain).    [name] loratadine (CLARITIN) 10 MG tablet Take 1 tablet (10 mg total) by mouth daily as needed for allergies. 30 tablet 2  . neomycin-bacitracin-polymyxin (NEOSPORIN) ointment Apply 1 application topically every 12 (twelve) hours as needed for wound care. Reported on 02/15/2015    . Selenium Sulfide 2.25 % SHAM Apply 1 each topically 2 (two) times a week. 180 mL 1  . Skin Protectants, Misc. (EUCERIN) cream Apply topically as needed for dry skin. 454 g 0  . amoxicillin (AMOXIL) 500 MG capsule Take  1 capsule (500 mg total) by mouth 3 (three) times daily. (Patient not taking: Reported on 03/21/2015) 30 capsule 0   No current facility-administered medications on file prior to visit.   Family History  Problem Relation Age of Onset  . Cancer Mother   . Heart disease Mother   . Heart disease Father    Social History   Social History  . Marital Status: Single    Spouse Name: N/A  . Number of Children: N/A  . Years of Education: N/A   Occupational History  . Not on file.   Social History Main Topics  . Smoking status: Current Every Day Smoker -- 0.25 packs/day    Types: Cigarettes  . Smokeless tobacco: Not on file  . Alcohol Use: Yes     Comment: 1 beer daily after work  . Drug Use: No  . Sexual Activity: Not on file   Other Topics Concern  . Not on file   Social History Narrative    Review of Systems: Constitutional: Negative for fever, chills, diaphoresis, activity change, appetite change and fatigue. HENT: Negative for ear pain, nosebleeds, congestion, facial swelling, rhinorrhea, neck pain, neck stiffness and ear discharge.  Eyes: Negative for pain, discharge, redness, itching and visual disturbance. Respiratory: Negative for cough, choking, chest tightness, shortness of breath, wheezing and stridor.  Cardiovascular: Negative for chest pain, palpitations and leg swelling. Gastrointestinal: Negative for abdominal distention. Genitourinary: Negative for dysuria, urgency, frequency, hematuria,  flank pain, decreased urine volume, difficulty urinating and dyspareunia.  Musculoskeletal: Negative for back pain, joint swelling, arthralgia and gait problem. Neurological: Negative for dizziness, tremors, seizures, syncope, facial asymmetry, speech difficulty, weakness, light-headedness, numbness and headaches.  Hematological: Negative for adenopathy. Does not bruise/bleed easily. Psychiatric/Behavioral: Negative for hallucinations, behavioral problems, confusion, dysphoric  mood, decreased concentration and agitation.    Objective:   Filed Vitals:   03/21/15 0946  BP: 126/88  Pulse: 86  Temp: 97.5 F (36.4 C)  Resp: 18    Physical Exam: Constitutional: Patient appears unkempt, looks chronically ill, poor dentition but in no distress.  HENT: Normocephalic, atraumatic, External right and left ear normal. Oropharynx is clear and moist.  Eyes: Conjunctivae and EOM are normal. PERRLA, no scleral icterus. Neck: Normal ROM. Neck supple. No JVD. No tracheal deviation. No thyromegaly. CVS: RRR, S1/S2 +, no murmurs, no gallops, no carotid bruit.  Pulmonary: Effort and breath sounds normal, no stridor, rhonchi, wheezes, rales.  Abdominal: Soft. BS +, no distension, tenderness, rebound or guarding.  Musculoskeletal: Normal range of motion. No edema and no tenderness.  Lymphadenopathy: No lymphadenopathy noted. Neuro: Alert X Oriented x 4 Skin: Eczema present bilateral arms and lower legs. Psychiatric: Normal mood and affect. Behavior, judgment, thought content normal.  Lab Results  Component Value Date   WBC 10.1 01/09/2015   HGB 13.2 01/09/2015   HCT 41.2 01/09/2015   MCV 89.2 01/09/2015   PLT 404* 01/09/2015   Lab Results  Component Value Date   CREATININE 0.76 01/09/2015   BUN 13 01/09/2015   NA 145 01/09/2015   K 5.2 01/09/2015   CL 106 01/09/2015   CO2 24 01/09/2015    No results found for: HGBA1C Lipid Panel     Component Value Date/Time   CHOL 205* 07/16/2009 2305   TRIG 65 07/16/2009 2305   HDL 96 07/16/2009 2305   CHOLHDL 2.1 Ratio 07/16/2009 2305   VLDL 13 07/16/2009 2305   LDLCALC 96 07/16/2009 2305       Assessment and plan:   Marie Woods was seen today for follow-up.  Diagnoses and all orders for this visit:  Healthcare maintenance -     Flu Vaccine QUAD 36+ mos PF IM (Fluarix & Fluzone Quad PF)  TOBACCO ABUSE  Marie Woods was counseled on the dangers of tobacco use, and was advised to quit. Reviewed strategies to maximize  success, including removing cigarettes and smoking materials from environment, stress management and support of family/friends.   Essential hypertension  We have discussed target BP range and blood pressure goal. I have advised patient to check BP regularly and to call us back or report to clinic if the numbers are consistently higher than 140/90. We discussed the importance of compliance with medical therapy and DASH diet recommended, consequences of uncontrolled hypertension discussed.   - continue current BP medications  Dyshidrotic eczema  -     Discontinue: clobetasol ointment (TEMOVATE) 0.05 %; Apply 1 application topically 2 (two) times daily. -     hydrOXYzine (ATARAX/VISTARIL) 25 MG tablet; Take 1 tablet (25 mg total) by mouth 3 (three) times daily as needed for itching. -     predniSONE (DELTASONE) 10 MG tablet; Take 1 tablet (10 mg total) by mouth daily with breakfast.  -     Ambulatory referral to Dermatology  Dermatitis, atopic  -     clobetasol (TEMOVATE) 0.05 % GEL; Apply 1 each topically 2 (two) times daily.  Return in about 1 week (around 03/28/2015) for  Pap Smear, Annual Physical.  The patient was given clear instructions to go to ER or return to medical center if symptoms don't improve, worsen or new problems develop. The patient verbalized understanding. The patient was told to call to get lab results if they haven't heard anything in the next week.     Stephanie Coup, AGNP-[facility] [phone] 03/21/2015, 12:58 PM   Evaluation and management procedures were performed by the Advanced Practitioner under my supervision and collaboration. I have reviewed the Advanced Practitioner's note and chart, and I agree with the management and plan.   Jeanann Lewandowsky, MD, MHA, CPE, FACP, FAAP [facility] [place], [place] [phone]   03/22/2015, 9:41 AM

## 2015-03-28 ENCOUNTER — Ambulatory Visit: Payer: Self-pay | Attending: Internal Medicine | Admitting: Internal Medicine

## 2015-03-28 VITALS — BP 131/77 | HR 78 | Temp 97.4°F | Resp 18 | Ht 66.5 in | Wt 158.6 lb

## 2015-03-28 DIAGNOSIS — Z7982 Long term (current) use of aspirin: Secondary | ICD-10-CM | POA: Insufficient documentation

## 2015-03-28 DIAGNOSIS — R87610 Atypical squamous cells of undetermined significance on cytologic smear of cervix (ASC-US): Secondary | ICD-10-CM | POA: Insufficient documentation

## 2015-03-28 DIAGNOSIS — I10 Essential (primary) hypertension: Secondary | ICD-10-CM | POA: Insufficient documentation

## 2015-03-28 DIAGNOSIS — Z0000 Encounter for general adult medical examination without abnormal findings: Secondary | ICD-10-CM | POA: Insufficient documentation

## 2015-03-28 DIAGNOSIS — J45909 Unspecified asthma, uncomplicated: Secondary | ICD-10-CM | POA: Insufficient documentation

## 2015-03-28 DIAGNOSIS — J449 Chronic obstructive pulmonary disease, unspecified: Secondary | ICD-10-CM | POA: Insufficient documentation

## 2015-03-28 DIAGNOSIS — Z79899 Other long term (current) drug therapy: Secondary | ICD-10-CM | POA: Insufficient documentation

## 2015-03-28 DIAGNOSIS — Y95 Nosocomial condition: Secondary | ICD-10-CM | POA: Insufficient documentation

## 2015-03-28 DIAGNOSIS — F17210 Nicotine dependence, cigarettes, uncomplicated: Secondary | ICD-10-CM | POA: Insufficient documentation

## 2015-03-28 DIAGNOSIS — F17200 Nicotine dependence, unspecified, uncomplicated: Secondary | ICD-10-CM

## 2015-03-28 DIAGNOSIS — Z1272 Encounter for screening for malignant neoplasm of vagina: Secondary | ICD-10-CM | POA: Insufficient documentation

## 2015-03-28 DIAGNOSIS — J189 Pneumonia, unspecified organism: Secondary | ICD-10-CM | POA: Insufficient documentation

## 2015-03-28 NOTE — Patient Instructions (Signed)
Hypertension Hypertension, commonly called high blood pressure, is when the force of blood pumping through your arteries is too strong. Your arteries are the blood vessels that carry blood from your heart throughout your body. A blood pressure reading consists of a higher number over a lower number, such as 110/72. The higher number (systolic) is the pressure inside your arteries when your heart pumps. The lower number (diastolic) is the pressure inside your arteries when your heart relaxes. Ideally you want your blood pressure below 120/80. Hypertension forces your heart to work harder to pump blood. Your arteries may become narrow or stiff. Having untreated or uncontrolled hypertension can cause heart attack, stroke, kidney disease, and other problems. RISK FACTORS Some risk factors for high blood pressure are controllable. Others are not.  Risk factors you cannot control include:   Race. You may be at higher risk if you are African American.  Age. Risk increases with age.  Gender. Men are at higher risk than women before age 45 years. After age 65, women are at higher risk than men. Risk factors you can control include:  Not getting enough exercise or physical activity.  Being overweight.  Getting too much fat, sugar, calories, or salt in your diet.  Drinking too much alcohol. SIGNS AND SYMPTOMS Hypertension does not usually cause signs or symptoms. Extremely high blood pressure (hypertensive crisis) may cause headache, anxiety, shortness of breath, and nosebleed. DIAGNOSIS To check if you have hypertension, your health care provider will measure your blood pressure while you are seated, with your arm held at the level of your heart. It should be measured at least twice using the same arm. Certain conditions can cause a difference in blood pressure between your right and left arms. A blood pressure reading that is higher than normal on one occasion does not mean that you need treatment. If  it is not clear whether you have high blood pressure, you may be asked to return on a different day to have your blood pressure checked again. Or, you may be asked to monitor your blood pressure at home for 1 or more weeks. TREATMENT Treating high blood pressure includes making lifestyle changes and possibly taking medicine. Living a healthy lifestyle can help lower high blood pressure. You may need to change some of your habits. Lifestyle changes may include:  Following the DASH diet. This diet is high in fruits, vegetables, and whole grains. It is low in salt, red meat, and added sugars.  Keep your sodium intake below 2,300 mg per day.  Getting at least 30-45 minutes of aerobic exercise at least 4 times per week.  Losing weight if necessary.  Not smoking.  Limiting alcoholic beverages.  Learning ways to reduce stress. Your health care provider may prescribe medicine if lifestyle changes are not enough to get your blood pressure under control, and if one of the following is true:  You are 18-59 years of age and your systolic blood pressure is above 140.  You are 60 years of age or older, and your systolic blood pressure is above 150.  Your diastolic blood pressure is above 90.  You have diabetes, and your systolic blood pressure is over 140 or your diastolic blood pressure is over 90.  You have kidney disease and your blood pressure is above 140/90.  You have heart disease and your blood pressure is above 140/90. Your personal target blood pressure may vary depending on your medical conditions, your age, and other factors. HOME CARE INSTRUCTIONS    Have your blood pressure rechecked as directed by your health care provider.   Take medicines only as directed by your health care provider. Follow the directions carefully. Blood pressure medicines must be taken as prescribed. The medicine does not work as well when you skip doses. Skipping doses also puts you at risk for  problems.  Do not smoke.   Monitor your blood pressure at home as directed by your health care provider. SEEK MEDICAL CARE IF:   You think you are having a reaction to medicines taken.  You have recurrent headaches or feel dizzy.  You have swelling in your ankles.  You have trouble with your vision. SEEK IMMEDIATE MEDICAL CARE IF:  You develop a severe headache or confusion.  You have unusual weakness, numbness, or feel faint.  You have severe chest or abdominal pain.  You vomit repeatedly.  You have trouble breathing. MAKE SURE YOU:   Understand these instructions.  Will watch your condition.  Will get help right away if you are not doing well or get worse.   This information is not intended to replace advice given to you by your health care provider. Make sure you discuss any questions you have with your health care provider.   Document Released: 01/19/2005 Document Revised: 06/05/2014 Document Reviewed: 11/11/2012 Elsevier Interactive Patient Education 2016 [employer]  

## 2015-03-28 NOTE — Progress Notes (Signed)
Marie Woods, is a 60 y.o. female  [id number]:[id number]  [id number]:[id number]  DOB - 1955-03-15  CC: No chief complaint on file.      HPI: Marie Woods is a 60 y.o. female here today to for pap smear. Patient has history of hypertension, asthma, COPD and eczema. Patient was seen in the office last week for physical and medication refills. Patient returns today for pap smear and vaginal exam. Patient has No headache, No chest pain, No abdominal pain - No Nausea, No new weakness tingling or numbness, No Cough - SOB.  No Known Allergies Past Medical History  Diagnosis Date  . Hypertension   . Asthma   . Eczema    Current Outpatient Prescriptions on File Prior to Visit  Medication Sig Dispense Refill  . albuterol (PROVENTIL HFA;VENTOLIN HFA) 108 (90 Base) MCG/ACT inhaler Inhale 2 puffs into the lungs every 6 (six) hours as needed for wheezing. 1 each 1  . amLODipine (NORVASC) 10 MG tablet Take 1 tablet (10 mg total) by mouth daily. 30 tablet 3  . amoxicillin (AMOXIL) 500 MG capsule Take 1 capsule (500 mg total) by mouth 3 (three) times daily. (Patient not taking: Reported on 03/21/2015) 30 capsule 0  . Aspirin-Salicylamide-Caffeine (BC HEADACHE POWDER PO) Take 1 each by mouth every 8 (eight) hours as needed (pain).    . clobetasol (TEMOVATE) 0.05 % GEL Apply 1 each topically 2 (two) times daily. 1 each 2  . hydrOXYzine (ATARAX/VISTARIL) 25 MG tablet Take 1 tablet (25 mg total) by mouth 3 (three) times daily as needed for itching. 60 tablet 3  . loratadine (CLARITIN) 10 MG tablet Take 1 tablet (10 mg total) by mouth daily as needed for allergies. 30 tablet 2  . neomycin-bacitracin-polymyxin (NEOSPORIN) ointment Apply 1 application topically every 12 (twelve) hours as needed for wound care. Reported on 02/15/2015    . predniSONE (DELTASONE) 10 MG tablet Take 1 tablet (10 mg total) by mouth daily with breakfast. 10 tablet 0  . Selenium Sulfide 2.25 % SHAM Apply 1 each topically 2 (two) times a week. 180  mL 1  . Skin Protectants, Misc. (EUCERIN) cream Apply topically as needed for dry skin. 454 g 0   No current facility-administered medications on file prior to visit.   Family History  Problem Relation Age of Onset  . Cancer Mother   . Heart disease Mother   . Heart disease Father    Social History   Social History  . Marital Status: Single    Spouse Name: N/A  . Number of Children: N/A  . Years of Education: N/A   Occupational History  . Not on file.   Social History Main Topics  . Smoking status: Current Every Day Smoker -- 0.25 packs/day    Types: Cigarettes  . Smokeless tobacco: Not on file  . Alcohol Use: Yes     Comment: 1 beer daily after work  . Drug Use: No  . Sexual Activity: Not on file   Other Topics Concern  . Not on file   Social History Narrative    Review of Systems: Constitutional: Negative for fever, chills, diaphoresis, activity change, appetite change and fatigue. HENT: Negative for ear pain, nosebleeds, congestion, facial swelling, rhinorrhea, neck pain, neck stiffness and ear discharge.  Eyes: Negative for pain, discharge, redness, itching and visual disturbance. Respiratory: Negative for cough, choking, chest tightness, shortness of breath, wheezing and stridor.  Cardiovascular: Negative for chest pain, palpitations and leg swelling. Gastrointestinal: Negative for abdominal distention.  Genitourinary: Negative for dysuria, urgency, frequency, hematuria, flank pain, decreased urine volume, difficulty urinating and dyspareunia.  Musculoskeletal: Negative for back pain, joint swelling, arthralgia and gait problem. Neurological: Negative for dizziness, tremors, seizures, syncope, facial asymmetry, speech difficulty, weakness, light-headedness, numbness and headaches.  Hematological: Negative for adenopathy. Does not bruise/bleed easily. Psychiatric/Behavioral: Negative for hallucinations, behavioral problems, confusion, dysphoric mood, decreased  concentration and agitation.    Objective:   Filed Vitals:   03/28/15 1444  BP: 131/77  Pulse: 78  Temp: 97.4 F (36.3 C)  Resp: 18    Physical Exam  Constitutional: She appears well-developed and well-nourished.  HENT:  Head: Normocephalic and atraumatic.  Cardiovascular: Normal rate and regular rhythm.   Respiratory: Effort normal and breath sounds normal.  Genitourinary: No erythema, tenderness or bleeding in the vagina. No foreign body around the vagina. No vaginal discharge found.  Pelvic Exam: Cervix normal in appearance, external genitalia normal, no adnexal masses or tenderness, no cervical motion tenderness, rectovaginal septum normal, uterus normal size, shape, and consistency and vagina normal without discharge     Lab Results  Component Value Date   WBC 10.1 01/09/2015   HGB 13.2 01/09/2015   HCT 41.2 01/09/2015   MCV 89.2 01/09/2015   PLT 404* 01/09/2015   Lab Results  Component Value Date   CREATININE 0.76 01/09/2015   BUN 13 01/09/2015   NA 145 01/09/2015   K 5.2 01/09/2015   CL 106 01/09/2015   CO2 24 01/09/2015    No results found for: HGBA1C Lipid Panel     Component Value Date/Time   CHOL 205* 07/16/2009 2305   TRIG 65 07/16/2009 2305   HDL 96 07/16/2009 2305   CHOLHDL 2.1 Ratio 07/16/2009 2305   VLDL 13 07/16/2009 2305   LDLCALC 96 07/16/2009 2305       Assessment and plan:   Diagnoses and all orders for this visit:  HCAP (healthcare-associated pneumonia)  Healthcare maintenance -     Cytology - PAP [place]  TOBACCO ABUSE  Marie Woods was counseled on the dangers of tobacco use, and was advised to quit. Reviewed strategies to maximize success, including removing cigarettes and smoking materials from environment, stress management and support of family/friends.   Return in about 6 months (around 09/25/2015) for Follow up HTN, Follow up Pain and comorbidities.  The patient was given clear instructions to go to ER or return to  medical center if symptoms don't improve, worsen or new problems develop. The patient verbalized understanding. The patient was told to call to get lab results if they haven't heard anything in the next week.     Stephanie Coup, AGNP-[facility] [phone] 03/28/2015, 3:52 PM  Evaluation and management procedures were performed by the Advanced Practitioner under my supervision and collaboration. I have reviewed the Advanced Practitioner's note and chart, and I agree with the management and plan.   Jeanann Lewandowsky, MD, MHA, CPE, FACP, FAAP [facility], [place] [phone]   03/28/2015, 6:44 PM

## 2015-03-29 ENCOUNTER — Encounter: Payer: Self-pay | Admitting: Clinical

## 2015-03-29 NOTE — Progress Notes (Signed)
Depression screen [facility] 2/9 03/21/2015 02/15/2015 02/15/2015 01/09/2015 01/09/2015  Decreased Interest 0 3 3 0 0  Down, Depressed, Hopeless - 3 3 0 0  PHQ - 2 Score 0 6 6 0 0  Altered sleeping - 3 - - -  Tired, decreased energy - 3 - - -  Change in appetite - 3 - - -  Feeling bad or failure about yourself  - 3 - - -  Trouble concentrating - 3 - - -  Moving slowly or fidgety/restless - 3 - - -  Suicidal thoughts - 3 - - -  PHQ-9 Score - 27 - - -    GAD 7 : Generalized Anxiety Score 02/15/2015 01/09/2015  Nervous, Anxious, on Edge 2 0  Control/stop worrying 3 -  Worry too much - different things 3 -  Trouble relaxing 3 -  Restless 2 -  Easily annoyed or irritable 2 -  Afraid - awful might happen 3 -  Total GAD 7 Score 18 -     03-21-15: PHQ9  17 (2,2,3,3,2,2,2,0,1) Yes, made plans to end life in last 2 weeks                GAD7   11 (1,2,2,2,0,2,2,)

## 2015-04-01 LAB — CYTOLOGY - PAP

## 2015-04-04 MED FILL — ?AMLODIPINE BESYLATE 10 MG: 10 | 30 days supply | Qty: 30 | Fill #2

## 2015-04-04 MED FILL — LORATADINE 10 MG TABLET: 10 | 30 days supply | Qty: 30 | Fill #2

## 2015-04-05 ENCOUNTER — Other Ambulatory Visit: Payer: Self-pay | Admitting: Internal Medicine

## 2015-04-05 ENCOUNTER — Telehealth: Payer: Self-pay | Admitting: *Deleted

## 2015-04-05 DIAGNOSIS — R87610 Atypical squamous cells of undetermined significance on cytologic smear of cervix (ASC-US): Secondary | ICD-10-CM

## 2015-04-05 DIAGNOSIS — R87810 Cervical high risk human papillomavirus (HPV) DNA test positive: Principal | ICD-10-CM

## 2015-04-05 NOTE — Telephone Encounter (Signed)
-----   Message from Quentin Angstlugbemiga E Jegede, MD sent at 04/05/2015  3:11 PM EST ----- Please inform patient that her Pap smear shows some abnormality, will refer to gynecologist for further testing and treatment. Referral placed.

## 2015-04-05 NOTE — Telephone Encounter (Signed)
Patient verified DOB Patient made aware of Pap Smear showing some abnormality. Patient aware of being referred to gynecology for further testing and treatment. Patient advised to look out for a call from the referral specialist with her appointment time and date. Patient expressed her understanding and had no further questions at this time.

## 2015-04-09 MED FILL — ?HYDROXYZINE HCL 25 MG TAB: 25 | 20 days supply | Qty: 60 | Fill #1

## 2015-04-09 MED FILL — CLOBETASOL 0.05% GEL: 0.05 | 30 days supply | Qty: 60 | Fill #1

## 2015-05-07 MED FILL — ?AMLODIPINE BESYLATE 10 MG: 10 | 30 days supply | Qty: 30 | Fill #3

## 2015-05-07 MED FILL — ?HYDROXYZINE HCL 25 MG TAB: 25 | 20 days supply | Qty: 60 | Fill #2

## 2015-05-09 ENCOUNTER — Other Ambulatory Visit: Payer: Self-pay | Admitting: Family Medicine

## 2015-05-09 MED FILL — LORATADINE 10 MG TABLET: 10 | 30 days supply | Qty: 30 | Fill #0

## 2015-05-09 MED FILL — CLOBETASOL 0.05% GEL: 0.05 | 30 days supply | Qty: 60 | Fill #2

## 2015-05-23 ENCOUNTER — Ambulatory Visit (INDEPENDENT_AMBULATORY_CARE_PROVIDER_SITE_OTHER): Payer: Self-pay | Admitting: Obstetrics & Gynecology

## 2015-05-23 ENCOUNTER — Other Ambulatory Visit (HOSPITAL_COMMUNITY)
Admission: RE | Admit: 2015-05-23 | Discharge: 2015-05-23 | Disposition: A | Discharge status: Home / Self Care | Payer: Self-pay | Source: Ambulatory Visit | Attending: Obstetrics & Gynecology | Admitting: Obstetrics & Gynecology

## 2015-05-23 ENCOUNTER — Encounter: Payer: Self-pay | Admitting: Obstetrics & Gynecology

## 2015-05-23 VITALS — BP 140/80 | HR 72 | Temp 98.6°F | Wt 160.0 lb

## 2015-05-23 DIAGNOSIS — R87610 Atypical squamous cells of undetermined significance on cytologic smear of cervix (ASC-US): Secondary | ICD-10-CM

## 2015-05-23 DIAGNOSIS — N870 Mild cervical dysplasia: Secondary | ICD-10-CM | POA: Insufficient documentation

## 2015-05-23 DIAGNOSIS — R87810 Cervical high risk human papillomavirus (HPV) DNA test positive: Secondary | ICD-10-CM | POA: Insufficient documentation

## 2015-05-23 NOTE — Progress Notes (Signed)
[name], female   DOB: 08-Jun-1955, 60 y.o.   MRN: [id number]  Chief Complaint  Patient presents with  . New Patient (Initial Visit)    Coloposcopy    HPI Marie Woods is a 60 y.o. female.  No obstetric history on file. G0P0  post menopausal HPI  Indications: Pap smear on February 2017 showed: ASCUS with POSITIVE high risk HPV. Previous colposcopy: no. Prior cervical treatment: no treatment.  Past Medical History  Diagnosis Date  . Hypertension   . Asthma   . Eczema     No past surgical history on file.  Family History  Problem Relation Age of Onset  . Cancer Mother   . Heart disease Mother   . Heart disease Father     Social History Social History  Substance Use Topics  . Smoking status: Current Every Day Smoker -- 0.25 packs/day    Types: Cigarettes  . Smokeless tobacco: None  . Alcohol Use: Yes     Comment: 1 beer daily after work    No Known Allergies  Current Outpatient Prescriptions  Medication Sig Dispense Refill  . albuterol (PROVENTIL HFA;VENTOLIN HFA) 108 (90 Base) MCG/ACT inhaler Inhale 2 puffs into the lungs every 6 (six) hours as needed for wheezing. 1 each 1  . amLODipine (NORVASC) 10 MG tablet Take 1 tablet (10 mg total) by mouth daily. 30 tablet 3  . loratadine (CLARITIN) 10 MG tablet TAKE 1 TABLET BY MOUTH DAILY AS NEEDED FOR ALLERGIES. 30 tablet 2  . amoxicillin (AMOXIL) 500 MG capsule Take 1 capsule (500 mg total) by mouth 3 (three) times daily. (Patient not taking: Reported on 03/21/2015) 30 capsule 0  . Aspirin-Salicylamide-Caffeine (BC HEADACHE POWDER PO) Take 1 each by mouth every 8 (eight) hours as needed (pain). Reported on 05/23/2015    . clobetasol (TEMOVATE) 0.05 % GEL Apply 1 each topically 2 (two) times daily. (Patient not taking: Reported on 05/23/2015) 1 each 2  . hydrOXYzine (ATARAX/VISTARIL) 25 MG tablet Take 1 tablet (25 mg total) by mouth 3 (three) times daily as needed for itching. (Patient not taking: Reported on  05/23/2015) 60 tablet 3  . neomycin-bacitracin-polymyxin (NEOSPORIN) ointment Apply 1 application topically every 12 (twelve) hours as needed for wound care. Reported on 05/23/2015    . Selenium Sulfide 2.25 % SHAM Apply 1 each topically 2 (two) times a week. (Patient not taking: Reported on 05/23/2015) 180 mL 1  . Skin Protectants, Misc. (EUCERIN) cream Apply topically as needed for dry skin. (Patient not taking: Reported on 05/23/2015) 454 g 0   No current facility-administered medications for this visit.    Review of Systems Review of Systems  Blood pressure 140/80, pulse 72, temperature 98.6 F (37 C), weight 160 lb (72.576 kg).  Physical Exam Physical Exam  Data Reviewed Pap result  Assessment    Procedure Details  The risks and benefits of the procedure and Written informed consent obtained.  Speculum placed in vagina and excellent visualization of cervix achieved, cervix swabbed x 3 with acetic acid solution. Small cervix no gross lesion, mild AWE at 2-4, ECC and bx at 3 Specimens: ECC and Bx  Complications: none.     Plan    Specimens labelled and sent to Pathology. Call to discuss Pathology results in 2 weeks.      ARNOLD,JAMES 05/23/2015, 1:24 PM

## 2015-05-23 NOTE — Patient Instructions (Signed)
COLPOSCOPY POST-PROCEDURE INSTRUCTIONS  1. You may take Ibuprofen, Aleve or Tylenol for cramping if needed.  2. If Monsel's solution was used, you will have a black discharge.  3. Light bleeding is normal.  If bleeding is heavier than your period, please call.  4. Put nothing in your vagina until the bleeding or discharge stops (usually 2 or3 days).  5. We will call you within one week with biopsy results or discuss the results at your follow-up appointment if needed. 6.  

## 2015-06-04 ENCOUNTER — Other Ambulatory Visit: Payer: Self-pay | Admitting: Internal Medicine

## 2015-06-04 MED FILL — CLOBETASOL 0.05% GEL: 0.05 | 30 days supply | Qty: 60 | Fill #0

## 2015-06-04 MED FILL — LORATADINE 10 MG TABLET: 10 | 30 days supply | Qty: 30 | Fill #1

## 2015-06-04 MED FILL — ?HYDROXYZINE HCL 25 MG TAB: 25 | 20 days supply | Qty: 60 | Fill #3

## 2015-06-05 ENCOUNTER — Telehealth: Payer: Self-pay

## 2015-06-05 NOTE — Telephone Encounter (Signed)
Per Dr. Debroah LoopArnold, LSIL and requests rpt pap in 12 months.  Called pt and informed pt of recommendations per provider. Pt stated thank you with no further questions.

## 2015-06-07 MED FILL — AMLODIPINE BESYLATE 10 MG T: 10 | 30 days supply | Qty: 30 | Fill #0

## 2015-07-04 ENCOUNTER — Other Ambulatory Visit: Payer: Self-pay | Admitting: Internal Medicine

## 2015-07-04 MED FILL — ?AMLODIPINE BESYLATE 10 MG: 10 | 30 days supply | Qty: 30 | Fill #1

## 2015-07-04 MED FILL — LORATADINE 10 MG TABLET: 10 | 30 days supply | Qty: 30 | Fill #2

## 2015-07-05 MED FILL — ?HYDROXYZINE HCL 25 MG TAB: 25 | 20 days supply | Qty: 60 | Fill #0

## 2015-07-31 ENCOUNTER — Other Ambulatory Visit: Payer: Self-pay | Admitting: Family Medicine

## 2015-07-31 MED FILL — ?HYDROXYZINE HCL 25 MG TAB: 25 | 20 days supply | Qty: 60 | Fill #1

## 2015-07-31 MED FILL — LORATADINE 10 MG TABLET: 10 | 30 days supply | Qty: 30 | Fill #0

## 2015-07-31 MED FILL — HYDROCHLOROTHIAZIDE 25 MG T: 25 | 30 days supply | Qty: 30 | Fill #0

## 2015-07-31 MED FILL — AMLODIPINE BESYLATE 10 MG T: 10 | 30 days supply | Qty: 30 | Fill #2

## 2015-09-02 MED FILL — ?HYDROXYZINE HCL 25 MG TAB: 25 | 20 days supply | Qty: 60 | Fill #2

## 2015-09-02 MED FILL — HYDROCHLOROTHIAZIDE 25 MG T: 25 | 30 days supply | Qty: 30 | Fill #1

## 2015-09-02 MED FILL — CLOBETASOL 0.05% GEL: 0.05 | 30 days supply | Qty: 60 | Fill #1

## 2015-09-02 MED FILL — LORATADINE 10 MG TABLET: 10 | 30 days supply | Qty: 30 | Fill #1

## 2015-09-02 MED FILL — AMLODIPINE BESYLATE 10 MG T: 10 | 30 days supply | Qty: 30 | Fill #3

## 2015-10-01 MED FILL — ?HYDROXYZINE HCL 25 MG TAB: 25 | 20 days supply | Qty: 60 | Fill #3

## 2015-10-01 MED FILL — HYDROCHLOROTHIAZIDE 25 MG T: 25 | 30 days supply | Qty: 30 | Fill #2

## 2015-10-01 MED FILL — CLOBETASOL 0.05% GEL: 0.05 | 30 days supply | Qty: 60 | Fill #2

## 2015-10-01 MED FILL — LORATADINE 10 MG TABLET: 10 | 30 days supply | Qty: 30 | Fill #2

## 2015-10-01 MED FILL — AMLODIPINE BESYLATE 10 MG T: 10 | 30 days supply | Qty: 30 | Fill #4

## 2015-10-29 ENCOUNTER — Other Ambulatory Visit: Payer: Self-pay | Admitting: Internal Medicine

## 2015-10-29 ENCOUNTER — Other Ambulatory Visit: Payer: Self-pay | Admitting: Family Medicine

## 2015-10-29 MED FILL — HYDROCHLOROTHIAZIDE 25 MG T: 25 | 30 days supply | Qty: 30 | Fill #3

## 2015-10-29 MED FILL — AMLODIPINE BESYLATE 10 MG T: 10 | 30 days supply | Qty: 30 | Fill #0

## 2015-10-29 MED FILL — ?HYDROXYZINE HCL 25 MG TAB: 25 | 20 days supply | Qty: 60 | Fill #0

## 2015-10-29 MED FILL — VENTOLIN HFA 90 MCG INHALER: 108 (90 BAS | 30 days supply | Qty: 18 | Fill #1

## 2015-10-29 MED FILL — CLOBETASOL 0.05% GEL: 0.05 | 20 days supply | Qty: 60 | Fill #0

## 2015-11-04 ENCOUNTER — Other Ambulatory Visit: Payer: Self-pay | Admitting: Internal Medicine

## 2015-11-14 ENCOUNTER — Ambulatory Visit: Payer: Self-pay | Attending: Internal Medicine | Admitting: Internal Medicine

## 2015-11-14 ENCOUNTER — Encounter: Payer: Self-pay | Admitting: Internal Medicine

## 2015-11-14 VITALS — BP 122/78 | HR 74 | Temp 97.6°F | Resp 18 | Ht 67.0 in | Wt 193.2 lb

## 2015-11-14 DIAGNOSIS — J449 Chronic obstructive pulmonary disease, unspecified: Secondary | ICD-10-CM | POA: Insufficient documentation

## 2015-11-14 DIAGNOSIS — F17200 Nicotine dependence, unspecified, uncomplicated: Secondary | ICD-10-CM

## 2015-11-14 DIAGNOSIS — G44209 Tension-type headache, unspecified, not intractable: Secondary | ICD-10-CM | POA: Insufficient documentation

## 2015-11-14 DIAGNOSIS — L209 Atopic dermatitis, unspecified: Secondary | ICD-10-CM | POA: Insufficient documentation

## 2015-11-14 DIAGNOSIS — Z23 Encounter for immunization: Secondary | ICD-10-CM | POA: Insufficient documentation

## 2015-11-14 DIAGNOSIS — I10 Essential (primary) hypertension: Secondary | ICD-10-CM | POA: Insufficient documentation

## 2015-11-14 DIAGNOSIS — L2084 Intrinsic (allergic) eczema: Secondary | ICD-10-CM

## 2015-11-14 DIAGNOSIS — Z7982 Long term (current) use of aspirin: Secondary | ICD-10-CM | POA: Insufficient documentation

## 2015-11-14 LAB — LIPID PANEL
CHOL/HDL RATIO: 2.6 ratio (ref <=5.0)
Cholesterol: 171 mg/dL (ref 125–200)
HDL: 67 mg/dL (ref >=46)
LDL Cholesterol: 88 mg/dL (ref <130)
Triglycerides: 79 mg/dL (ref <150)
VLDL: 16 mg/dL (ref <30)

## 2015-11-14 LAB — CBC WITH DIFFERENTIAL/PLATELET
BASOS PCT: 0 %
Basophils Absolute: 0 cells/uL (ref 0–200)
EOS PCT: 9 %
Eosinophils Absolute: 693 cells/uL — ABNORMAL HIGH (ref 15–500)
HEMATOCRIT: 42.7 % (ref 35.0–45.0)
HEMOGLOBIN: 14.2 g/dL (ref 11.7–15.5)
LYMPHS ABS: 2695 {cells}/uL (ref 850–3900)
Lymphocytes Relative: 35 %
MCH: 28.8 pg (ref 27.0–33.0)
MCHC: 33.3 g/dL (ref 32.0–36.0)
MCV: 86.6 fL (ref 80.0–100.0)
MONO ABS: 693 {cells}/uL (ref 200–950)
MPV: 9.3 fL (ref 7.5–12.5)
Monocytes Relative: 9 %
NEUTROS ABS: 3619 {cells}/uL (ref 1500–7800)
Neutrophils Relative %: 47 %
Platelets: 326 10*3/uL (ref 140–400)
RBC: 4.93 MIL/uL (ref 3.80–5.10)
RDW: 14.8 % (ref 11.0–15.0)
WBC: 7.7 10*3/uL (ref 3.8–10.8)

## 2015-11-14 LAB — COMPLETE METABOLIC PANEL WITH GFR
ALBUMIN: 4.1 g/dL (ref 3.6–5.1)
ALK PHOS: 65 U/L (ref 33–130)
ALT: 10 U/L (ref 6–29)
AST: 17 U/L (ref 10–35)
BUN: 23 mg/dL (ref 7–25)
CALCIUM: 9.9 mg/dL (ref 8.6–10.4)
CO2: 27 mmol/L (ref 20–31)
CREATININE: 0.83 mg/dL (ref 0.50–1.05)
Chloride: 101 mmol/L (ref 98–110)
GFR, Est African American: >89 mL/min (ref >=60)
GFR, Est Non African American: 78 mL/min (ref >=60)
GLUCOSE: 64 mg/dL — AB (ref 65–99)
POTASSIUM: 3.7 mmol/L (ref 3.5–5.3)
SODIUM: 138 mmol/L (ref 135–146)
TOTAL PROTEIN: 7.8 g/dL (ref 6.1–8.1)
Total Bilirubin: 0.5 mg/dL (ref 0.2–1.2)

## 2015-11-14 LAB — TSH: TSH: 1.97 m[IU]/L

## 2015-11-14 MED ORDER — PREDNISONE 10 MG PO TABS: ORAL_TABLET | ORAL | 0 refills | Status: DC

## 2015-11-14 MED ORDER — NAPROXEN 500 MG PO TABS: 500.0000 mg | ORAL_TABLET | Freq: Two times a day (BID) | ORAL | 2 refills | Status: DC

## 2015-11-14 MED ORDER — AMLODIPINE BESYLATE 10 MG PO TABS: 10.0000 mg | ORAL_TABLET | Freq: Every day | ORAL | 3 refills | Status: DC

## 2015-11-14 MED ORDER — HYDROXYZINE HCL 25 MG PO TABS: 25.0000 mg | ORAL_TABLET | Freq: Three times a day (TID) | ORAL | 3 refills | Status: DC | PRN

## 2015-11-14 MED ORDER — LORATADINE 10 MG PO TABS: 10.0000 mg | ORAL_TABLET | Freq: Every day | ORAL | 3 refills | Status: DC | PRN

## 2015-11-14 MED FILL — hydrOXYzine HCL 25 MG TABS: 25 | 20 days supply | Qty: 60 | Fill #0

## 2015-11-14 MED FILL — predniSONE 10 MG TABS: 10 | 7 days supply | Qty: 13 | Fill #0

## 2015-11-14 MED FILL — NAPROXEN 500 MG TABLET: 500 | 30 days supply | Qty: 30 | Fill #0

## 2015-11-14 NOTE — Progress Notes (Signed)
Marie Dallasdith Byers, is a 60 y.o. female  [id number]  [id number]  DOB - 1955/02/14  Chief Complaint  Patient presents with  . Headache      Subjective:   Marie Woods is a 60 y.o. female with history of HTN, asthma, COPD and ezcema here today for a follow up visit and medication refill. Since the last visit, she has gained 33 lbs, but she said she is now back to her usual weight because she lost so much weight when she was "sick". Major complaint today is occasional headache for the past 3 weeks, mostly around the right temple, no associated nausea or vomiting, no visual changes, no symptom suggestive of sinus infection but she has noticed some allergic rhinitis lately, responds to Claritin. She is requesting refill. Blood pressure is controlled. Patient has No chest pain, No abdominal pain - No Nausea, No new weakness tingling or numbness, No Cough - SOB.  ALLERGIES: No Known Allergies  PAST MEDICAL HISTORY: Past Medical History:  Diagnosis Date  . Asthma   . Eczema   . Hypertension     MEDICATIONS AT HOME: Prior to Admission medications   Medication Sig Start Date End Date Taking? Authorizing Provider  albuterol (PROVENTIL HFA;VENTOLIN HFA) 108 (90 Base) MCG/ACT inhaler Inhale 2 puffs into the lungs every 6 (six) hours as needed for wheezing. 02/15/15  Yes Jaclyn ShaggyEnobong Amao, MD  amLODipine (NORVASC) 10 MG tablet Take 1 tablet (10 mg total) by mouth daily. 11/14/15  Yes Quentin Angstlugbemiga E Yaileen Hofferber, MD  Aspirin-Salicylamide-Caffeine (BC HEADACHE POWDER PO) Take 1 each by mouth every 8 (eight) hours as needed (pain). Reported on 05/23/2015   Yes Historical Provider, MD  clobetasol (TEMOVATE) 0.05 % GEL APPLY 1 APPLICATION TOPICALLY 2 TIMES DAILY 10/29/15  Yes Quentin Angstlugbemiga E Erabella Kuipers, MD  hydrOXYzine (ATARAX/VISTARIL) 25 MG tablet Take 1 tablet (25 mg total) by mouth every 8 (eight) hours as needed. 11/14/15  Yes Quentin Angstlugbemiga E Honestie Kulik, MD  loratadine (CLARITIN) 10 MG tablet Take 1 tablet (10 mg total)  by mouth daily as needed for allergies. 11/14/15  Yes Quentin Angstlugbemiga E Haylo Fake, MD  neomycin-bacitracin-polymyxin (NEOSPORIN) ointment Apply 1 application topically every 12 (twelve) hours as needed for wound care. Reported on 05/23/2015   Yes Historical Provider, MD  naproxen (NAPROSYN) 500 MG tablet Take 1 tablet (500 mg total) by mouth 2 (two) times daily with a meal. 11/14/15   Lucelia Lacey E Hyman HopesJegede, MD  predniSONE (DELTASONE) 10 MG tablet Take 3 tabs daily x 2 days, 2 tabs daily x 2 days then 1 tab daily for 3 days. 11/14/15   Quentin Angstlugbemiga E Ambermarie Honeyman, MD  Selenium Sulfide 2.25 % SHAM Apply 1 each topically 2 (two) times a week. Patient not taking: Reported on 11/14/2015 06/13/14   Jaclyn ShaggyEnobong Amao, MD  Skin Protectants, Misc. (EUCERIN) cream Apply topically as needed for dry skin. Patient not taking: Reported on 11/14/2015 05/28/14   Felicie Mornavid Smith, NP    Objective:   Vitals:   11/14/15 0937  BP: 122/78  Pulse: 74  Resp: 18  Temp: 97.6 F (36.4 C)  TempSrc: Oral  SpO2: 100%  Weight: 193 lb 3.2 oz (87.6 kg)  Height: 5\' 7"  (1.702 m)   Exam General appearance : Awake, alert, not in any distress. Speech Clear. Not toxic looking HEENT: Atraumatic and Normocephalic, pupils equally reactive to light and accomodation Neck: Supple, no JVD. No cervical lymphadenopathy.  Chest: Good air entry bilaterally, no added sounds  CVS: S1 S2 regular, no murmurs.  Abdomen: Bowel sounds  present, Non tender and not distended with no gaurding, rigidity or rebound. Extremities: B/L Lower Ext shows no edema, both legs are warm to touch Neurology: Awake alert, and oriented X 3, CN II-XII intact, Non focal  Data Review No results found for: HGBA1C  Assessment & Plan   1. Needs flu shot  - Flu Vaccine QUAD 36+ mos PF IM (Fluarix & Fluzone Quad PF)  2. Essential hypertension  - amLODipine (NORVASC) 10 MG tablet; Take 1 tablet (10 mg total) by mouth daily.  Dispense: 90 tablet; Refill: 3 - CBC with  Differential/Platelet - Lipid panel - Urinalysis, Complete  We have discussed target BP range and blood pressure goal. I have advised patient to check BP regularly and to call us back or report to clinic if the numbers are consistently higher than 140/90. We discussed the importance of compliance with medical therapy and DASH diet recommended, consequences of uncontrolled hypertension discussed.  - continue current BP medications  3. TOBACCO ABUSE  Anh was counseled on the dangers of tobacco use, and was advised to quit. Reviewed strategies to maximize success, including removing cigarettes and smoking materials from environment, stress management and support of family/friends.  4. Intrinsic atopic dermatitis  - loratadine (CLARITIN) 10 MG tablet; Take 1 tablet (10 mg total) by mouth daily as needed for allergies.  Dispense: 30 tablet; Refill: 3 - hydrOXYzine (ATARAX/VISTARIL) 25 MG tablet; Take 1 tablet (25 mg total) by mouth every 8 (eight) hours as needed.  Dispense: 60 tablet; Refill: 3 - predniSONE (DELTASONE) 10 MG tablet; Take 3 tabs daily x 2 days, 2 tabs daily x 2 days then 1 tab daily for 3 days.  Dispense: 13 tablet; Refill: 0  5. Tension-type headache, not intractable, unspecified chronicity pattern  - Will try NSAIDs, if headache persist in 2 weeks, please return in 2 weeks for further evaluation. - naproxen (NAPROSYN) 500 MG tablet; Take 1 tablet (500 mg total) by mouth 2 (two) times daily with a meal.  Dispense: 30 tablet; Refill: 2 - COMPLETE METABOLIC PANEL WITH GFR - TSH - Urinalysis, Complete  Patient have been counseled extensively about nutrition and exercise. Other issues discussed during this visit include: low cholesterol diet, weight control and daily exercise, foot care, annual eye examinations at Ophthalmology, importance of adherence with medications and regular follow-up. We also discussed long term complications of uncontrolled hypertension.   Return in  about 3 months (around 02/14/2016) for Follow up HTN, Generalized Anxiety Disorder.  The patient was given clear instructions to go to ER or return to medical center if symptoms don't improve, worsen or new problems develop. The patient verbalized understanding. The patient was told to call to get lab results if they haven't heard anything in the next week.   This note has been created with Education officer, environmental. Any transcriptional errors are unintentional.    Jeanann Lewandowsky, MD, MHA, FACP, FAAP, CPE [facility] [place], [place] [phone]   11/14/2015, 10:26 AM

## 2015-11-14 NOTE — Progress Notes (Signed)
Patient is here for HA and concentration  Patient has gained 33 lbs in the past 5 months.  Patient complains of HA's being present intermittently for the past 3 weeks. Pain is mainly located on the right temporal.  Patient has taken medication today and patient has eaten today.  Patient request a refill on Claritin and something to assist with eczema.  Patient tolerated flu vaccine well today.

## 2015-11-14 NOTE — Patient Instructions (Signed)
Headache and Arthritis  If you have arthritis and headaches, it is possible the two problems are related. Some headaches can be caused by arthritis in your neck (cervicogenic headaches).   Pain medicine is another possible link between arthritis and headache. If you take a lot of over-the-counter medicines for arthritis pain, you may develop the type of headache that can happen when you stop taking your over-the-counter pain reliever or lower your dose too quickly (rebound headache).   WHAT TYPES OF ARTHRITIS CAN CAUSE A HEADACHE?  There are two types of arthritis, rheumatoid arthritis and osteoarthritis. Both types of arthritis can cause headaches.   · Rheumatoid arthritis (RA) is an autoimmune disease that causes inflammation of your joints. When you have RA, your body's defense system (immune system) attacks the joints of your body and causes inflammation. This can lead to deformity over time.  · Osteoarthritis (OA) is wear and tear caused by joint use over time. Osteoarthritis is not an inflammatory disease.  Both OA and RA can cause neck pain that is felt in the head. When the pain is felt in a different location than it originates, it is called radiating or referred pain. This pain is usually felt in the back of the head.   HOW ARE HEADACHES AND ARTHRITIS RELATED?  RA can affect any joint in the body, including the joints between the bones of the neck (cervical vertebrae). The neck joints most commonly affected by RA are the top two joints, between the first and second cervical vertebra. Inflammation in these joints may be felt as neck pain and head pain.  OA of the neck may be caused by gradual wear and tear or by a neck injury. Neck vertebrae may develop calcium deposits in the areas where muscle attach. Wear and tear of the vertebra may cause pressure on the nerves that leave the spinal cord. These changes can cause referred pain that may be felt as a headache.  HOW ARE HEADACHES ASSOCIATED WITH ARTHRITIS  DIAGNOSED?  · Your health care provider may diagnose headache caused by RA if you have inflammation of vertebrae in your neck. You may have:  ¨ Blood tests to measure how much inflammation you have.  ¨ Imaging studies of your neck (MRI) to check for inflammation of cervical vertebrae.  · Your health care provider may diagnose headache caused by OA if an X-ray shows:  ¨ Calcium deposits.  ¨ Bone spurs.  ¨ Narrowing of the space between neck vertebrae.  · Your health care provider may diagnose rebound headache if you have a history of using over-the-counter pain relievers frequently.  WHEN SHOULD I SEEK CARE FOR MY HEADACHES?  Call your health care provider if:  · You have more than three headaches per week.  · You take an over-the-counter pain reliever almost every day.  · Your headaches are getting worse and happening more often.  · You have headache with fever.  · You have headache with numbness, weakness, or dizziness.  · You have headache with nausea or vomiting.  WHAT ARE MY TREATMENT OPTIONS?  · If you have headache caused by RA, treatment may include:    Over-the-counter or prescription-strength anti-inflammatory medicines.    Disease-modifying antirheumatic drugs (DMARDs). These medicines slow or stop the progression of RA.  · If you have headache caused by OA, treatment may include:    Over-the-counter pain medicines.    Heat or massage.    Physical therapy.  · If you have rebound headaches:      They will usually go away within several days of stopping the medicine that caused them.    You may be able to gradually reduce the amount of medicines you take to prevent headache.    Ask your health care provider if you can take another type of medicine instead.     This information is not intended to replace advice given to you by your health care provider. Make sure you discuss any questions you have with your health care provider.     Document Released: 04/11/2003 Document Revised: 02/09/2014 Document Reviewed:  04/24/2013  Elsevier Interactive Patient Education ©2016 [employer]

## 2015-11-15 LAB — URINALYSIS, COMPLETE
BACTERIA UA: NONE SEEN [HPF]
Bilirubin Urine: NEGATIVE
CRYSTALS: NONE SEEN [HPF]
Casts: NONE SEEN [LPF]
Glucose, UA: NEGATIVE
Ketones, ur: NEGATIVE
Nitrite: NEGATIVE
PROTEIN: NEGATIVE
RBC / HPF: NONE SEEN RBC/HPF (ref <=2)
SPECIFIC GRAVITY, URINE: 1.021 (ref 1.001–1.035)
YEAST: NONE SEEN [HPF]
pH: 5 (ref 5.0–8.0)

## 2015-11-19 ENCOUNTER — Telehealth: Payer: Self-pay | Admitting: *Deleted

## 2015-11-19 NOTE — Telephone Encounter (Signed)
Patient verified DOB Patient is aware of lab results being normal. Patient expressed her understanding and had no further questions at this time.

## 2015-11-19 NOTE — Telephone Encounter (Signed)
-----   Message from Quentin Angstlugbemiga E Jegede, MD sent at 11/17/2015  3:42 PM EDT ----- Please inform patient that her lab results are normal

## 2015-12-05 MED FILL — AMLODIPINE BESYLATE 10 MG T: 10 | 30 days supply | Qty: 30 | Fill #0

## 2015-12-05 MED FILL — hydrOXYzine HCL 25 MG TABS: 25 | 20 days supply | Qty: 60 | Fill #1

## 2015-12-06 MED FILL — NAPROXEN 500 MG TABLET: 500 | 30 days supply | Qty: 30 | Fill #1

## 2015-12-06 MED FILL — HYDROCHLOROTHIAZIDE 25 MG T: 25 | 30 days supply | Qty: 30 | Fill #4

## 2016-01-01 ENCOUNTER — Other Ambulatory Visit: Payer: Self-pay | Admitting: Internal Medicine

## 2016-01-01 ENCOUNTER — Other Ambulatory Visit: Payer: Self-pay | Admitting: Family Medicine

## 2016-01-08 MED FILL — AMLODIPINE BESYLATE 10 MG T: 10 | 30 days supply | Qty: 30 | Fill #1

## 2016-01-08 MED FILL — NAPROXEN 500 MG TABLET: 500 | 30 days supply | Qty: 30 | Fill #2

## 2016-01-08 MED FILL — CLOBETASOL 0.05% GEL: 0.05 | 15 days supply | Qty: 60 | Fill #0

## 2016-01-08 MED FILL — hydrOXYzine HCL 25 MG TABS: 25 | 20 days supply | Qty: 60 | Fill #2

## 2016-02-04 ENCOUNTER — Other Ambulatory Visit: Payer: Self-pay | Admitting: Family Medicine

## 2016-02-04 ENCOUNTER — Other Ambulatory Visit: Payer: Self-pay | Admitting: Internal Medicine

## 2016-02-04 DIAGNOSIS — G44209 Tension-type headache, unspecified, not intractable: Secondary | ICD-10-CM

## 2016-02-04 MED FILL — hydrOXYzine HCL 25 MG TABS: 25 | 20 days supply | Qty: 60 | Fill #3

## 2016-02-04 MED FILL — AMLODIPINE BESYLATE 10 MG T: 10 | 30 days supply | Qty: 30 | Fill #2

## 2016-02-04 MED FILL — NAPROXEN 500 MG TABLET: 500 | 15 days supply | Qty: 30 | Fill #0

## 2016-03-02 ENCOUNTER — Other Ambulatory Visit: Payer: Self-pay | Admitting: Internal Medicine

## 2016-03-02 DIAGNOSIS — G44209 Tension-type headache, unspecified, not intractable: Secondary | ICD-10-CM

## 2016-03-02 DIAGNOSIS — L2084 Intrinsic (allergic) eczema: Secondary | ICD-10-CM

## 2016-03-09 MED FILL — hydrOXYzine HCL 25 MG TABS: 25 | 20 days supply | Qty: 60 | Fill #0

## 2016-03-09 MED FILL — NAPROXEN 500 MG TABLET: 500 | 15 days supply | Qty: 30 | Fill #0

## 2016-04-03 ENCOUNTER — Other Ambulatory Visit: Payer: Self-pay | Admitting: Internal Medicine

## 2016-04-03 DIAGNOSIS — G44209 Tension-type headache, unspecified, not intractable: Secondary | ICD-10-CM

## 2016-04-03 DIAGNOSIS — L2084 Intrinsic (allergic) eczema: Secondary | ICD-10-CM

## 2016-04-03 MED FILL — AMLODIPINE BESYLATE 10 MG T: 10 | 30 days supply | Qty: 30 | Fill #3

## 2016-04-03 MED FILL — hydrOXYzine HCL 25 MG TABS: 25 | 20 days supply | Qty: 60 | Fill #0

## 2016-04-03 MED FILL — NAPROXEN 500 MG TABLET: 500 | 15 days supply | Qty: 30 | Fill #0

## 2016-05-04 ENCOUNTER — Other Ambulatory Visit: Payer: Self-pay | Admitting: Family Medicine

## 2016-05-04 ENCOUNTER — Other Ambulatory Visit: Payer: Self-pay | Admitting: Internal Medicine

## 2016-05-04 DIAGNOSIS — G44209 Tension-type headache, unspecified, not intractable: Secondary | ICD-10-CM

## 2016-05-04 DIAGNOSIS — L2084 Intrinsic (allergic) eczema: Secondary | ICD-10-CM

## 2016-05-04 MED FILL — AMLODIPINE BESYLATE 10 MG T: 10 | 30 days supply | Qty: 30 | Fill #4

## 2016-05-04 MED FILL — ?HYDROXYZINE HCL 25 MG TAB: 25 MG | 20 days supply | Qty: 60 | Fill #0

## 2016-05-04 MED FILL — NAPROXEN 500 MG TABLET: 500 | 15 days supply | Qty: 30 | Fill #0

## 2016-05-04 MED FILL — CLOBETASOL 0.05% GEL: 0.05 | 30 days supply | Qty: 60 | Fill #0

## 2016-05-04 MED FILL — LORATADINE 10 MG TABLET: 10 | 30 days supply | Qty: 30 | Fill #0

## 2016-05-06 ENCOUNTER — Other Ambulatory Visit: Payer: Self-pay | Admitting: Family Medicine

## 2016-06-01 ENCOUNTER — Other Ambulatory Visit: Payer: Self-pay | Admitting: Internal Medicine

## 2016-06-01 DIAGNOSIS — L2084 Intrinsic (allergic) eczema: Secondary | ICD-10-CM

## 2016-06-01 DIAGNOSIS — G44209 Tension-type headache, unspecified, not intractable: Secondary | ICD-10-CM

## 2016-06-03 MED FILL — ?HYDROXYZINE HCL 25 MG TAB: 25 MG | 20 days supply | Qty: 60 | Fill #0

## 2016-06-03 MED FILL — AMLODIPINE BESYLATE 10 MG T: 10 | 30 days supply | Qty: 30 | Fill #5

## 2016-06-03 MED FILL — CLOBETASOL 0.05% GEL: 0.05 | 30 days supply | Qty: 60 | Fill #0

## 2016-06-03 MED FILL — NAPROXEN 500 MG TABLET: 500 | 15 days supply | Qty: 30 | Fill #0

## 2016-06-17 ENCOUNTER — Other Ambulatory Visit: Payer: Self-pay | Admitting: Internal Medicine

## 2016-06-17 ENCOUNTER — Ambulatory Visit: Payer: Self-pay | Attending: Internal Medicine | Admitting: Internal Medicine

## 2016-06-17 ENCOUNTER — Encounter: Payer: Self-pay | Admitting: Internal Medicine

## 2016-06-17 VITALS — BP 119/76 | HR 76 | Temp 98.5°F | Resp 18 | Ht 67.0 in | Wt 204.0 lb

## 2016-06-17 DIAGNOSIS — Z1211 Encounter for screening for malignant neoplasm of colon: Secondary | ICD-10-CM

## 2016-06-17 DIAGNOSIS — I10 Essential (primary) hypertension: Secondary | ICD-10-CM | POA: Insufficient documentation

## 2016-06-17 DIAGNOSIS — L2084 Intrinsic (allergic) eczema: Secondary | ICD-10-CM

## 2016-06-17 DIAGNOSIS — L209 Atopic dermatitis, unspecified: Secondary | ICD-10-CM | POA: Insufficient documentation

## 2016-06-17 DIAGNOSIS — Z131 Encounter for screening for diabetes mellitus: Secondary | ICD-10-CM

## 2016-06-17 DIAGNOSIS — J449 Chronic obstructive pulmonary disease, unspecified: Secondary | ICD-10-CM | POA: Insufficient documentation

## 2016-06-17 DIAGNOSIS — Z1239 Encounter for other screening for malignant neoplasm of breast: Secondary | ICD-10-CM

## 2016-06-17 DIAGNOSIS — Z1231 Encounter for screening mammogram for malignant neoplasm of breast: Secondary | ICD-10-CM

## 2016-06-17 DIAGNOSIS — G44209 Tension-type headache, unspecified, not intractable: Secondary | ICD-10-CM | POA: Insufficient documentation

## 2016-06-17 LAB — POCT GLYCOSYLATED HEMOGLOBIN (HGB A1C): HEMOGLOBIN A1C: 5

## 2016-06-17 MED ORDER — CLOBETASOL PROPIONATE 0.05 % EX GEL: CUTANEOUS | 2 refills | Status: DC

## 2016-06-17 MED ORDER — HYDROXYZINE HCL 25 MG PO TABS: 25.0000 mg | ORAL_TABLET | Freq: Three times a day (TID) | ORAL | 3 refills | Status: DC | PRN

## 2016-06-17 MED ORDER — PREDNISONE 20 MG PO TABS: ORAL_TABLET | ORAL | 0 refills | Status: DC

## 2016-06-17 MED ORDER — LORATADINE 10 MG PO TABS
10.0000 mg | ORAL_TABLET | Freq: Every day | ORAL | 3 refills | Status: DC | PRN
Start: 2016-06-17 — End: 2017-03-18

## 2016-06-17 MED ORDER — AMLODIPINE BESYLATE 10 MG PO TABS: 10.0000 mg | ORAL_TABLET | Freq: Every day | ORAL | 3 refills | Status: DC

## 2016-06-17 MED ORDER — NAPROXEN 500 MG PO TABS: ORAL_TABLET | ORAL | 1 refills | Status: DC
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
## 2016-06-17 NOTE — Patient Instructions (Signed)
DASH Eating Plan DASH stands for "Dietary Approaches to Stop Hypertension." The DASH eating plan is a healthy eating plan that has been shown to reduce high blood pressure (hypertension). It may also reduce your risk for type 2 diabetes, heart disease, and stroke. The DASH eating plan may also help with weight loss. What are tips for following this plan? General guidelines   Avoid eating more than 2,300 mg (milligrams) of salt (sodium) a day. If you have hypertension, you may need to reduce your sodium intake to 1,500 mg a day.  Limit alcohol intake to no more than 1 drink a day for nonpregnant women and 2 drinks a day for men. One drink equals 12 oz of beer, 5 oz of wine, or 1 oz of hard liquor.  Work with your health care provider to maintain a healthy body weight or to lose weight. Ask what an ideal weight is for you.  Get at least 30 minutes of exercise that causes your heart to beat faster (aerobic exercise) most days of the week. Activities may include walking, swimming, or biking.  Work with your health care provider or diet and nutrition specialist (dietitian) to adjust your eating plan to your individual calorie needs. Reading food labels   Check food labels for the amount of sodium per serving. Choose foods with less than 5 percent of the Daily Value of sodium. Generally, foods with less than 300 mg of sodium per serving fit into this eating plan.  To find whole grains, look for the word "whole" as the first word in the ingredient list. Shopping   Buy products labeled as "low-sodium" or "no salt added."  Buy fresh foods. Avoid canned foods and premade or frozen meals. Cooking   Avoid adding salt when cooking. Use salt-free seasonings or herbs instead of table salt or sea salt. Check with your health care provider or pharmacist before using salt substitutes.  Do not fry foods. Cook foods using healthy methods such as baking, boiling, grilling, and broiling instead.  Cook with  heart-healthy oils, such as olive, canola, soybean, or sunflower oil. Meal planning    Eat a balanced diet that includes:  5 or more servings of fruits and vegetables each day. At each meal, try to fill half of your plate with fruits and vegetables.  Up to 6-8 servings of whole grains each day.  Less than 6 oz of lean meat, poultry, or fish each day. A 3-oz serving of meat is about the same size as a deck of cards. One egg equals 1 oz.  2 servings of low-fat dairy each day.  A serving of nuts, seeds, or beans 5 times each week.  Heart-healthy fats. Healthy fats called Omega-3 fatty acids are found in foods such as flaxseeds and coldwater fish, like sardines, salmon, and mackerel.  Limit how much you eat of the following:  Canned or prepackaged foods.  Food that is high in trans fat, such as fried foods.  Food that is high in saturated fat, such as fatty meat.  Sweets, desserts, sugary drinks, and other foods with added sugar.  Full-fat dairy products.  Do not salt foods before eating.  Try to eat at least 2 vegetarian meals each week.  Eat more home-cooked food and less restaurant, buffet, and fast food.  When eating at a restaurant, ask that your food be prepared with less salt or no salt, if possible. What foods are recommended? The items listed may not be a complete list. Talk   with your dietitian about what dietary choices are best for you. Grains  Whole-grain or whole-wheat bread. Whole-grain or whole-wheat pasta. Brown rice. Oatmeal. Quinoa. Bulgur. Whole-grain and low-sodium cereals. Pita bread. Low-fat, low-sodium crackers. Whole-wheat flour tortillas. Vegetables  Fresh or frozen vegetables (raw, steamed, roasted, or grilled). Low-sodium or reduced-sodium tomato and vegetable juice. Low-sodium or reduced-sodium tomato sauce and tomato paste. Low-sodium or reduced-sodium canned vegetables. Fruits  All fresh, dried, or frozen fruit. Canned fruit in natural juice  (without added sugar). Meat and other protein foods  Skinless chicken or turkey. Ground chicken or turkey. Pork with fat trimmed off. Fish and seafood. Egg whites. Dried beans, peas, or lentils. Unsalted nuts, nut butters, and seeds. Unsalted canned beans. Lean cuts of beef with fat trimmed off. Low-sodium, lean deli meat. Dairy  Low-fat (1%) or fat-free (skim) milk. Fat-free, low-fat, or reduced-fat cheeses. Nonfat, low-sodium ricotta or cottage cheese. Low-fat or nonfat yogurt. Low-fat, low-sodium cheese. Fats and oils  Soft margarine without trans fats. Vegetable oil. Low-fat, reduced-fat, or light mayonnaise and salad dressings (reduced-sodium). Canola, safflower, olive, soybean, and sunflower oils. Avocado. Seasoning and other foods  Herbs. Spices. Seasoning mixes without salt. Unsalted popcorn and pretzels. Fat-free sweets. What foods are not recommended? The items listed may not be a complete list. Talk with your dietitian about what dietary choices are best for you. Grains  Baked goods made with fat, such as croissants, muffins, or some breads. Dry pasta or rice meal packs. Vegetables  Creamed or fried vegetables. Vegetables in a cheese sauce. Regular canned vegetables (not low-sodium or reduced-sodium). Regular canned tomato sauce and paste (not low-sodium or reduced-sodium). Regular tomato and vegetable juice (not low-sodium or reduced-sodium). Pickles. Olives. Fruits  Canned fruit in a light or heavy syrup. Fried fruit. Fruit in cream or butter sauce. Meat and other protein foods  Fatty cuts of meat. Ribs. Fried meat. Bacon. Sausage. Bologna and other processed lunch meats. Salami. Fatback. Hotdogs. Bratwurst. Salted nuts and seeds. Canned beans with added salt. Canned or smoked fish. Whole eggs or egg yolks. Chicken or turkey with skin. Dairy  Whole or 2% milk, cream, and half-and-half. Whole or full-fat cream cheese. Whole-fat or sweetened yogurt. Full-fat cheese. Nondairy creamers.  Whipped toppings. Processed cheese and cheese spreads. Fats and oils  Butter. Stick margarine. Lard. Shortening. Ghee. Bacon fat. Tropical oils, such as coconut, palm kernel, or palm oil. Seasoning and other foods  Salted popcorn and pretzels. Onion salt, garlic salt, seasoned salt, table salt, and sea salt. Worcestershire sauce. Tartar sauce. Barbecue sauce. Teriyaki sauce. Soy sauce, including reduced-sodium. Steak sauce. Canned and packaged gravies. Fish sauce. Oyster sauce. Cocktail sauce. Horseradish that you find on the shelf. Ketchup. Mustard. Meat flavorings and tenderizers. Bouillon cubes. Hot sauce and Tabasco sauce. Premade or packaged marinades. Premade or packaged taco seasonings. Relishes. Regular salad dressings. Where to find more information:  National Heart, Lung, and Blood Institute: www.nhlbi.nih.gov  American Heart Association: www.heart.org Summary  The DASH eating plan is a healthy eating plan that has been shown to reduce high blood pressure (hypertension). It may also reduce your risk for type 2 diabetes, heart disease, and stroke.  With the DASH eating plan, you should limit salt (sodium) intake to 2,300 mg a day. If you have hypertension, you may need to reduce your sodium intake to 1,500 mg a day.  When on the DASH eating plan, aim to eat more fresh fruits and vegetables, whole grains, lean proteins, low-fat dairy, and heart-healthy fats.  Work   with your health care provider or diet and nutrition specialist (dietitian) to adjust your eating plan to your individual calorie needs. This information is not intended to replace advice given to you by your health care provider. Make sure you discuss any questions you have with your health care provider. Document Released: 01/08/2011 Document Revised: 01/13/2016 Document Reviewed: 01/13/2016 Elsevier Interactive Patient Education  2017 [employer] Hypertension Hypertension, commonly called high blood pressure, is when  the force of blood pumping through the arteries is too strong. The arteries are the blood vessels that carry blood from the heart throughout the body. Hypertension forces the heart to work harder to pump blood and may cause arteries to become narrow or stiff. Having untreated or uncontrolled hypertension can cause heart attacks, strokes, kidney disease, and other problems. A blood pressure reading consists of a higher number over a lower number. Ideally, your blood pressure should be below 120/80. The first ("top") number is called the systolic pressure. It is a measure of the pressure in your arteries as your heart beats. The second ("bottom") number is called the diastolic pressure. It is a measure of the pressure in your arteries as the heart relaxes. What are the causes? The cause of this condition is not known. What increases the risk? Some risk factors for high blood pressure are under your control. Others are not. Factors you can change   Smoking.  Having type 2 diabetes mellitus, high cholesterol, or both.  Not getting enough exercise or physical activity.  Being overweight.  Having too much fat, sugar, calories, or salt (sodium) in your diet.  Drinking too much alcohol. Factors that are difficult or impossible to change   Having chronic kidney disease.  Having a family history of high blood pressure.  Age. Risk increases with age.  Race. You may be at higher risk if you are African-American.  Gender. Men are at higher risk than women before age 45. After age 65, women are at higher risk than men.  Having obstructive sleep apnea.  Stress. What are the signs or symptoms? Extremely high blood pressure (hypertensive crisis) may cause:  Headache.  Anxiety.  Shortness of breath.  Nosebleed.  Nausea and vomiting.  Severe chest pain.  Jerky movements you cannot control (seizures). How is this diagnosed? This condition is diagnosed by measuring your blood pressure  while you are seated, with your arm resting on a surface. The cuff of the blood pressure monitor will be placed directly against the skin of your upper arm at the level of your heart. It should be measured at least twice using the same arm. Certain conditions can cause a difference in blood pressure between your right and left arms. Certain factors can cause blood pressure readings to be lower or higher than normal (elevated) for a short period of time:  When your blood pressure is higher when you are in a health care provider's office than when you are at home, this is called white coat hypertension. Most people with this condition do not need medicines.  When your blood pressure is higher at home than when you are in a health care provider's office, this is called masked hypertension. Most people with this condition may need medicines to control blood pressure. If you have a high blood pressure reading during one visit or you have normal blood pressure with other risk factors:  You may be asked to return on a different day to have your blood pressure checked again.  You may   be asked to monitor your blood pressure at home for 1 week or longer. If you are diagnosed with hypertension, you may have other blood or imaging tests to help your health care provider understand your overall risk for other conditions. How is this treated? This condition is treated by making healthy lifestyle changes, such as eating healthy foods, exercising more, and reducing your alcohol intake. Your health care provider may prescribe medicine if lifestyle changes are not enough to get your blood pressure under control, and if:  Your systolic blood pressure is above 130.  Your diastolic blood pressure is above 80. Your personal target blood pressure may vary depending on your medical conditions, your age, and other factors. Follow these instructions at home: Eating and drinking   Eat a diet that is high in fiber and  potassium, and low in sodium, added sugar, and fat. An example eating plan is called the DASH (Dietary Approaches to Stop Hypertension) diet. To eat this way:  Eat plenty of fresh fruits and vegetables. Try to fill half of your plate at each meal with fruits and vegetables.  Eat whole grains, such as whole wheat pasta, brown rice, or whole grain bread. Fill about one quarter of your plate with whole grains.  Eat or drink low-fat dairy products, such as skim milk or low-fat yogurt.  Avoid fatty cuts of meat, processed or cured meats, and poultry with skin. Fill about one quarter of your plate with lean proteins, such as fish, chicken without skin, beans, eggs, and tofu.  Avoid premade and processed foods. These tend to be higher in sodium, added sugar, and fat.  Reduce your daily sodium intake. Most people with hypertension should eat less than 1,500 mg of sodium a day.  Limit alcohol intake to no more than 1 drink a day for nonpregnant women and 2 drinks a day for men. One drink equals 12 oz of beer, 5 oz of wine, or 1 oz of hard liquor. Lifestyle   Work with your health care provider to maintain a healthy body weight or to lose weight. Ask what an ideal weight is for you.  Get at least 30 minutes of exercise that causes your heart to beat faster (aerobic exercise) most days of the week. Activities may include walking, swimming, or biking.  Include exercise to strengthen your muscles (resistance exercise), such as pilates or lifting weights, as part of your weekly exercise routine. Try to do these types of exercises for 30 minutes at least 3 days a week.  Do not use any products that contain nicotine or tobacco, such as cigarettes and e-cigarettes. If you need help quitting, ask your health care provider.  Monitor your blood pressure at home as told by your health care provider.  Keep all follow-up visits as told by your health care provider. This is important. Medicines   Take  over-the-counter and prescription medicines only as told by your health care provider. Follow directions carefully. Blood pressure medicines must be taken as prescribed.  Do not skip doses of blood pressure medicine. Doing this puts you at risk for problems and can make the medicine less effective.  Ask your health care provider about side effects or reactions to medicines that you should watch for. Contact a health care provider if:  You think you are having a reaction to a medicine you are taking.  You have headaches that keep coming back (recurring).  You feel dizzy.  You have swelling in your ankles.  You   have trouble with your vision. Get help right away if:  You develop a severe headache or confusion.  You have unusual weakness or numbness.  You feel faint.  You have severe pain in your chest or abdomen.  You vomit repeatedly.  You have trouble breathing. Summary  Hypertension is when the force of blood pumping through your arteries is too strong. If this condition is not controlled, it may put you at risk for serious complications.  Your personal target blood pressure may vary depending on your medical conditions, your age, and other factors. For most people, a normal blood pressure is less than 120/80.  Hypertension is treated with lifestyle changes, medicines, or a combination of both. Lifestyle changes include weight loss, eating a healthy, low-sodium diet, exercising more, and limiting alcohol. This information is not intended to replace advice given to you by your health care provider. Make sure you discuss any questions you have with your health care provider. Document Released: 01/19/2005 Document Revised: 12/18/2015 Document Reviewed: 12/18/2015 Elsevier Interactive Patient Education  2017 Elsevier Inc.  

## 2016-06-17 NOTE — Progress Notes (Signed)
Marie Woods, is a 61 y.o. female  [id number]  [id number]  DOB - [birth year]  Chief Complaint  Patient presents with  . Medication Refill       Subjective:   Marie Woods is a 61 y.o. female with history of HTN, asthma, COPD and ezcema here today for a follow up visit and medication refill. She has no new complaint today. In fact, she said she feels fine. She just need her medications refilled today. Blood pressure is controlled. Patient has No headache, No chest pain, No abdominal pain - No Nausea, No new weakness tingling or numbness, No Cough - SOB. She is due for colonoscopy and mammogram.  No problems updated.  ALLERGIES: No Known Allergies  PAST MEDICAL HISTORY: Past Medical History:  Diagnosis Date  . Asthma   . Eczema   . Hypertension     MEDICATIONS AT HOME: Prior to Admission medications   Medication Sig Start Date End Date Taking? Authorizing Provider  albuterol (PROVENTIL HFA;VENTOLIN HFA) 108 (90 Base) MCG/ACT inhaler Inhale 2 puffs into the lungs every 6 (six) hours as needed for wheezing. 02/15/15  Yes Jaclyn ShaggyAmao, Enobong, MD  amLODipine (NORVASC) 10 MG tablet Take 1 tablet (10 mg total) by mouth daily. 06/17/16  Yes Quentin AngstJegede, Maely Clements E, MD  Aspirin-Salicylamide-Caffeine (BC HEADACHE POWDER PO) Take 1 each by mouth every 8 (eight) hours as needed (pain). Reported on 05/23/2015   Yes [provider]  clobetasol (TEMOVATE) 0.05 % GEL APPLY 1 APPLICATION TOPICALLY 2 TIMES DAILY 06/17/16  Yes Quentin AngstJegede, Jimma Ortman E, MD  hydrOXYzine (ATARAX/VISTARIL) 25 MG tablet Take 1 tablet (25 mg total) by mouth every 8 (eight) hours as needed. 06/17/16  Yes Quentin AngstJegede, Cire Clute E, MD  loratadine (CLARITIN) 10 MG tablet Take 1 tablet (10 mg total) by mouth daily as needed for allergies. 06/17/16  Yes Ladonte Verstraete E, MD  naproxen (NAPROSYN) 500 MG tablet TAKE 1 TABLET BY MOUTH 2 TIMES DAILY WITH A MEAL. 06/17/16  Yes Quentin AngstJegede, Romulus Hanrahan E, MD  neomycin-bacitracin-polymyxin  (NEOSPORIN) ointment Apply 1 application topically every 12 (twelve) hours as needed for wound care. Reported on 05/23/2015   Yes [provider]  Selenium Sulfide 2.25 % SHAM Apply 1 each topically 2 (two) times a week. 06/13/14  Yes Jaclyn ShaggyAmao, Enobong, MD  Skin Protectants, Misc. (EUCERIN) cream Apply topically as needed for dry skin. 05/28/14  Yes Felicie MornSmith, David, NP  predniSONE (DELTASONE) 20 MG tablet Take 3 tabs daily x 2 days, 2 tabs daily x 2 days then 1 tab daily for 3 days. 06/17/16   Quentin AngstJegede, Melinna Linarez E, MD    Objective:   Vitals:   06/17/16 1131  BP: 119/76  Pulse: 76  Resp: 18  Temp: 98.5 F (36.9 C)  TempSrc: Oral  SpO2: 96%  Weight: 204 lb (92.5 kg)  Height: 5\' 7"  (1.702 m)   Exam General appearance : Awake, alert, not in any distress. Speech Clear. Not toxic looking, obese, poor dentition HEENT: Atraumatic and Normocephalic, pupils equally reactive to light and accomodation Neck: Supple, no JVD. No cervical lymphadenopathy.  Chest: Good air entry bilaterally, no added sounds  CVS: S1 S2 regular, no murmurs.  Abdomen: Bowel sounds present, Non tender and not distended with no gaurding, rigidity or rebound. Extremities: B/L Lower Ext shows no edema, both legs are warm to touch Neurology: Awake alert, and oriented X 3, CN II-XII intact, Non focal  Assessment & Plan   1. Essential hypertension  - amLODipine (NORVASC) 10 MG tablet; Take 1  tablet (10 mg total) by mouth daily.  Dispense: 90 tablet; Refill: 3  2. Intrinsic atopic dermatitis  - clobetasol (TEMOVATE) 0.05 % GEL; APPLY 1 APPLICATION TOPICALLY 2 TIMES DAILY  Dispense: 60 each; Refill: 2 - hydrOXYzine (ATARAX/VISTARIL) 25 MG tablet; Take 1 tablet (25 mg total) by mouth every 8 (eight) hours as needed.  Dispense: 60 tablet; Refill: 3 - loratadine (CLARITIN) 10 MG tablet; Take 1 tablet (10 mg total) by mouth daily as needed for allergies.  Dispense: 30 tablet; Refill: 3 - predniSONE (DELTASONE) 20 MG tablet;  Take 3 tabs daily x 2 days, 2 tabs daily x 2 days then 1 tab daily for 3 days.  Dispense: 13 tablet; Refill: 0  3. Tension-type headache, not intractable, unspecified chronicity pattern  - naproxen (NAPROSYN) 500 MG tablet; TAKE 1 TABLET BY MOUTH 2 TIMES DAILY WITH A MEAL.  Dispense: 60 tablet; Refill: 1  4. Colon cancer screening  - Ambulatory referral to Gastroenterology  5. Breast cancer screening  - MM Digital Screening; Future  Patient have been counseled extensively about nutrition and exercise. Other issues discussed during this visit include: low cholesterol diet, weight control and daily exercise, importance of adherence with medications and regular follow-up. We also discussed long term complications of uncontrolled hypertension.   Return in about 6 months (around 12/18/2016) for Follow up HTN, Follow up Pain and comorbidities.  The patient was given clear instructions to go to ER or return to medical center if symptoms don't improve, worsen or new problems develop. The patient verbalized understanding. The patient was told to call to get lab results if they haven't heard anything in the next week.   This note has been created with Education officer, environmental. Any transcriptional errors are unintentional.    Jeanann Lewandowsky, MD, MHA, FACP, FAAP, CPE [facility] [place], [place] [phone]   06/17/2016, 12:26 PM

## 2016-06-24 ENCOUNTER — Other Ambulatory Visit: Payer: Self-pay | Admitting: Internal Medicine

## 2016-06-24 DIAGNOSIS — Z1231 Encounter for screening mammogram for malignant neoplasm of breast: Secondary | ICD-10-CM

## 2016-07-06 MED FILL — ?PREDNISONE 20 MG TABLET: 20 | 7 days supply | Qty: 13 | Fill #0

## 2016-07-06 MED FILL — ?HYDROXYZINE HCL 25 MG TAB: 25 MG | 20 days supply | Qty: 60 | Fill #0

## 2016-07-06 MED FILL — AMLODIPINE BESYLATE 10 MG T: 10 | 30 days supply | Qty: 30 | Fill #0

## 2016-07-06 MED FILL — NAPROXEN 500 MG TABLET: 500 | 30 days supply | Qty: 60 | Fill #0

## 2016-07-08 ENCOUNTER — Ambulatory Visit
Admission: RE | Admit: 2016-07-08 | Discharge: 2016-07-08 | Disposition: A | Discharge status: Home / Self Care | Payer: No Typology Code available for payment source | Source: Ambulatory Visit | Attending: Internal Medicine | Admitting: Internal Medicine

## 2016-07-08 DIAGNOSIS — Z1231 Encounter for screening mammogram for malignant neoplasm of breast: Secondary | ICD-10-CM

## 2016-07-09 ENCOUNTER — Telehealth: Payer: Self-pay | Admitting: *Deleted

## 2016-07-09 NOTE — Telephone Encounter (Signed)
-----   Message from Quentin Angstlugbemiga E Jegede, MD sent at 07/08/2016  5:32 PM EDT ----- Please inform patient that her screening mammogram shows no evidence of malignancy. Recommend screening mammogram in one year

## 2016-07-09 NOTE — Telephone Encounter (Signed)
MA informed patient of mammogram showing no evidence of malignancy and a recommended screening be completed in one year. Medical Assistant left message on patient's home and cell voicemail. Voicemail states to [place] [facility] [place] [facility] [place] [facility] [phone].

## 2016-07-31 MED FILL — ?HYDROXYZINE HCL 25 MG TAB: 25 MG | 20 days supply | Qty: 60 | Fill #1

## 2016-07-31 MED FILL — ?AMLODIPINE BESYLATE 10 MG: 10 | 30 days supply | Qty: 30 | Fill #1

## 2016-09-04 ENCOUNTER — Other Ambulatory Visit: Payer: Self-pay | Admitting: Internal Medicine

## 2016-09-04 MED FILL — CLOBETASOL 0.05% GEL: 0.05 | 30 days supply | Qty: 60 | Fill #0

## 2016-09-04 MED FILL — ?AMLODIPINE BESYLATE 10 MG: 10 | 30 days supply | Qty: 30 | Fill #2

## 2016-09-04 MED FILL — hydrOXYzine HCL 25 MG TABS: 25 | 20 days supply | Qty: 60 | Fill #2

## 2016-10-02 MED FILL — hydrOXYzine HCL 25 MG TABS: 25 | 20 days supply | Qty: 60 | Fill #3

## 2016-11-02 ENCOUNTER — Other Ambulatory Visit: Payer: Self-pay | Admitting: Internal Medicine

## 2016-11-02 DIAGNOSIS — L2084 Intrinsic (allergic) eczema: Secondary | ICD-10-CM

## 2016-11-02 MED FILL — AMLODIPINE BESYLATE 10 MG T: 10 | 30 days supply | Qty: 30 | Fill #3

## 2016-11-02 MED FILL — hydrOXYzine HCL 25 MG TABS: 25 | 20 days supply | Qty: 60 | Fill #0

## 2016-12-03 MED FILL — hydrOXYzine HCL 25 MG TABS: 25 | 20 days supply | Qty: 60 | Fill #1

## 2016-12-03 MED FILL — CLOBETASOL 0.05% GEL: 0.05 | 30 days supply | Qty: 60 | Fill #0

## 2016-12-03 MED FILL — AMLODIPINE BESYLATE 10 MG T: 10 | 30 days supply | Qty: 30 | Fill #4

## 2017-01-05 MED FILL — hydrOXYzine HCL 25 MG TABS: 25 | 20 days supply | Qty: 60 | Fill #2

## 2017-02-05 ENCOUNTER — Other Ambulatory Visit: Payer: Self-pay | Admitting: Internal Medicine

## 2017-02-05 DIAGNOSIS — L2084 Intrinsic (allergic) eczema: Secondary | ICD-10-CM

## 2017-02-05 MED FILL — AMLODIPINE BESYLATE 10 MG T: 10 | 30 days supply | Qty: 30 | Fill #5

## 2017-02-05 MED FILL — CLOBETASOL 0.05% GEL: 0.05 | 30 days supply | Qty: 60 | Fill #1

## 2017-02-19 ENCOUNTER — Ambulatory Visit: Payer: No Typology Code available for payment source

## 2017-02-19 ENCOUNTER — Ambulatory Visit: Payer: No Typology Code available for payment source | Attending: Internal Medicine

## 2017-03-15 ENCOUNTER — Other Ambulatory Visit: Payer: Self-pay | Admitting: Internal Medicine

## 2017-03-15 DIAGNOSIS — L2084 Intrinsic (allergic) eczema: Secondary | ICD-10-CM

## 2017-03-15 MED FILL — ?AMLODIPINE BESYLATE 10MG T: 10 | 30 days supply | Qty: 30 | Fill #6

## 2017-03-16 MED FILL — hydrOXYzine HCL 25 MG TABS: 25 | 20 days supply | Qty: 60 | Fill #0

## 2017-03-18 ENCOUNTER — Ambulatory Visit (HOSPITAL_COMMUNITY)
Admission: RE | Admit: 2017-03-18 | Discharge: 2017-03-18 | Disposition: A | Discharge status: Home / Self Care | Payer: Self-pay | Source: Ambulatory Visit | Attending: Physician Assistant | Admitting: Physician Assistant

## 2017-03-18 ENCOUNTER — Ambulatory Visit: Payer: Self-pay | Attending: Internal Medicine | Admitting: Physician Assistant

## 2017-03-18 VITALS — BP 128/85 | HR 98 | Temp 97.8°F | Resp 16 | Ht 67.0 in | Wt 236.6 lb

## 2017-03-18 DIAGNOSIS — M25562 Pain in left knee: Secondary | ICD-10-CM | POA: Insufficient documentation

## 2017-03-18 DIAGNOSIS — M25561 Pain in right knee: Secondary | ICD-10-CM

## 2017-03-18 DIAGNOSIS — L209 Atopic dermatitis, unspecified: Secondary | ICD-10-CM | POA: Insufficient documentation

## 2017-03-18 DIAGNOSIS — M1712 Unilateral primary osteoarthritis, left knee: Secondary | ICD-10-CM | POA: Insufficient documentation

## 2017-03-18 DIAGNOSIS — J45909 Unspecified asthma, uncomplicated: Secondary | ICD-10-CM | POA: Insufficient documentation

## 2017-03-18 DIAGNOSIS — Z7982 Long term (current) use of aspirin: Secondary | ICD-10-CM | POA: Insufficient documentation

## 2017-03-18 DIAGNOSIS — Z79899 Other long term (current) drug therapy: Secondary | ICD-10-CM | POA: Insufficient documentation

## 2017-03-18 DIAGNOSIS — L2084 Intrinsic (allergic) eczema: Secondary | ICD-10-CM

## 2017-03-18 DIAGNOSIS — I10 Essential (primary) hypertension: Secondary | ICD-10-CM | POA: Insufficient documentation

## 2017-03-18 MED ORDER — LORATADINE 10 MG PO TABS: 10.0000 mg | ORAL_TABLET | Freq: Every day | ORAL | 3 refills | Status: DC | PRN

## 2017-03-18 MED ORDER — NAPROXEN 500 MG PO TABS: 500.0000 mg | ORAL_TABLET | Freq: Two times a day (BID) | ORAL | 1 refills | Status: DC

## 2017-03-18 MED ORDER — HYDROXYZINE HCL 25 MG PO TABS: 25.0000 mg | ORAL_TABLET | Freq: Three times a day (TID) | ORAL | 2 refills | Status: DC | PRN

## 2017-03-18 MED FILL — NAPROXEN 500 MG TABLET: 500 | 30 days supply | Qty: 60 | Fill #0

## 2017-03-18 NOTE — Progress Notes (Signed)
[name], female   DOB: October 24, 1955, 62 y.o.   MRN: [id number]   Marie Woods, is a 62 y.o. female  [id number]  [id number]  DOB - 12/05/1955  Subjective:  Chief Complaint and HPI: Marie Woods is a 62 y.o. female here today for several issues.  She has been having B knee pain with intermittent swelling on and off for about 1 month.  NKI.  Played volleyball as a teenager.  Pain worse with weight bearing.  No f/c.  No h/o gout.    Also, needs allergy meds RF.   Hasn't had blood work in a long time.  Compliant with BP meds.    ROS:   Constitutional:  No f/c, No night sweats, No unexplained weight loss. EENT:  No vision changes, No blurry vision, No hearing changes. No mouth, throat, or ear problems.  Respiratory: No cough, No SOB Cardiac: No CP, no palpitations GI:  No abd pain, No N/V/D. GU: No Urinary s/sx Musculoskeletal: +B knee pain and swelling Neuro: No headache, no dizziness, no motor weakness.  Skin: No rash Endocrine:  No polydipsia. No polyuria.  Psych: Denies SI/HI  No problems updated.  ALLERGIES: No Known Allergies  PAST MEDICAL HISTORY: Past Medical History:  Diagnosis Date  . Asthma   . Eczema   . Hypertension     MEDICATIONS AT HOME: Prior to Admission medications   Medication Sig Start Date End Date Taking? Authorizing Provider  albuterol (PROVENTIL HFA;VENTOLIN HFA) 108 (90 Base) MCG/ACT inhaler Inhale 2 puffs into the lungs every 6 (six) hours as needed for wheezing. 02/15/15  Yes Hoy Register, MD  amLODipine (NORVASC) 10 MG tablet Take 1 tablet (10 mg total) by mouth daily. 06/17/16  Yes Quentin Angst, MD  Aspirin-Salicylamide-Caffeine (BC HEADACHE POWDER PO) Take 1 each by mouth every 8 (eight) hours as needed (pain). Reported on 05/23/2015   Yes [provider]  clobetasol (TEMOVATE) 0.05 % GEL APPLY 1 APPLICATION TOPICALLY 2 TIMES DAILY 09/04/16  Yes Jegede, Olugbemiga E, MD  loratadine (CLARITIN) 10 MG tablet  Take 1 tablet (10 mg total) by mouth daily as needed for allergies. 03/18/17  Yes Georgian Co M, PA-C  naproxen (NAPROSYN) 500 MG tablet Take 1 tablet (500 mg total) by mouth 2 (two) times daily with a meal. X 10 days then prn pain 03/18/17  Yes Georgian Co M, PA-C  hydrOXYzine (ATARAX/VISTARIL) 25 MG tablet Take 1 tablet (25 mg total) by mouth every 8 (eight) hours as needed. 03/18/17   Anders Simmonds, PA-C  neomycin-bacitracin-polymyxin (NEOSPORIN) ointment Apply 1 application topically every 12 (twelve) hours as needed for wound care. Reported on 05/23/2015    [provider]  predniSONE (DELTASONE) 20 MG tablet Take 3 tabs daily x 2 days, 2 tabs daily x 2 days then 1 tab daily for 3 days. Patient not taking: Reported on 03/18/2017 06/17/16   Quentin Angst, MD  Selenium Sulfide 2.25 % SHAM Apply 1 each topically 2 (two) times a week. Patient not taking: Reported on 03/18/2017 06/13/14   Hoy Register, MD  Skin Protectants, Misc. (EUCERIN) cream Apply topically as needed for dry skin. Patient not taking: Reported on 03/18/2017 05/28/14   Felicie Morn, NP     Objective:  EXAM:   Vitals:   03/18/17 1431  BP: 128/85  Pulse: 98  Resp: 16  Temp: 97.8 F (36.6 C)  TempSrc: Oral  SpO2: 99%  Weight: 236 lb 9.6 oz (107.3 kg)  Height: 5'  7" (1.702 m)    General appearance : A&OX3. NAD. Non-toxic-appearing HEENT: Atraumatic and Normocephalic.  PERRLA. EOM intact.   Neck: supple, no JVD. No cervical lymphadenopathy. No thyromegaly Chest/Lungs:  Breathing-non-labored, Good air entry bilaterally, breath sounds normal without rales, rhonchi, or wheezing  CVS: S1 S2 regular, no murmurs, gallops, rubs  Extremities: Bilateral Lower Ext shows no edema, both legs are warm to touch with = pulse throughout.  B knees with mild suprapatellar swelling L >R.  No ballotment.  B joints stable w/o laxity.  Using a cane for ambulation.  No erythema or sign of infection.   Neurology:  CN  II-XII grossly intact, Non focal.   Psych:  TP linear. J/I WNL. Normal speech. Appropriate eye contact and affect.  Skin:  No Rash  Data Review Lab Results  Component Value Date   HGBA1C 5.0 06/17/2016     Assessment & Plan   1. Acute pain of both knees No sign of infection - naproxen (NAPROSYN) 500 MG tablet; Take 1 tablet (500 mg total) by mouth 2 (two) times daily with a meal. X 10 days then prn pain  Dispense: 60 tablet; Refill: 1 - DG Knee Complete 4 Views Right; Future - DG Knee Complete 4 Views Left; Future - Sedimentation Rate - Ambulatory referral to Orthopedic Surgery  2. Intrinsic atopic dermatitis stable with meds - loratadine (CLARITIN) 10 MG tablet; Take 1 tablet (10 mg total) by mouth daily as needed for allergies.  Dispense: 30 tablet; Refill: 3 - hydrOXYzine (ATARAX/VISTARIL) 25 MG tablet; Take 1 tablet (25 mg total) by mouth every 8 (eight) hours as needed.  Dispense: 60 tablet; Refill: 2  3. Essential hypertension Controlled-continue current regimen - Comprehensive metabolic panel - CBC with Differential/Platelet   Patient have been counseled extensively about nutrition and exercise  Return for keep 04/12/2017 appt to establish care with Dr Laural BenesJohnson.  The patient was given clear instructions to go to ER or return to medical center if symptoms don't improve, worsen or new problems develop. The patient verbalized understanding. The patient was told to call to get lab results if they haven't heard anything in the next week.     Georgian CoAngela Halaina Vanduzer, PA-C [facility] [place], [place] [phone]   03/18/2017, 2:51 PM

## 2017-03-18 NOTE — Progress Notes (Signed)
Allergies  BLE pain. Greater on the right RF hydroxyzine

## 2017-03-19 ENCOUNTER — Telehealth (INDEPENDENT_AMBULATORY_CARE_PROVIDER_SITE_OTHER): Payer: Self-pay | Admitting: *Deleted

## 2017-03-19 NOTE — Telephone Encounter (Signed)
-----   Message from Anders SimmondsAngela M McClung, [place] sent at 03/19/2017 11:30 AM EST ----- Please call patient.  Her xrays showed arthritis.  Take meds as discussed and see orthopedist once referral/appointment is scheduled. Thanks, Georgian CoAngela McClung, PA-C

## 2017-03-19 NOTE — Telephone Encounter (Signed)
Medical Assistant left message on patient's home and cell voicemail. Voicemail states to give [place] call back [place] [facility] at [phone]. !!!Please inform patient of arthritis being noted and to take medications as prescribed and follow up with orthopedist once scheduled!!!

## 2017-04-07 MED FILL — hydrOXYzine HCL 25 MG TABS: 25 | 20 days supply | Qty: 60 | Fill #0

## 2017-04-07 MED FILL — AMLODIPINE BESYLATE 10 MG T: 10 | 30 days supply | Qty: 30 | Fill #7

## 2017-04-12 ENCOUNTER — Ambulatory Visit (INDEPENDENT_AMBULATORY_CARE_PROVIDER_SITE_OTHER): Payer: Self-pay | Admitting: Orthopaedic Surgery

## 2017-04-12 ENCOUNTER — Encounter (INDEPENDENT_AMBULATORY_CARE_PROVIDER_SITE_OTHER): Payer: Self-pay | Admitting: Orthopaedic Surgery

## 2017-04-12 ENCOUNTER — Ambulatory Visit: Payer: Self-pay | Attending: Internal Medicine | Admitting: Internal Medicine

## 2017-04-12 ENCOUNTER — Encounter: Payer: Self-pay | Admitting: Internal Medicine

## 2017-04-12 VITALS — BP 135/86 | HR 87 | Temp 98.1°F | Resp 16 | Ht 68.0 in | Wt 238.8 lb

## 2017-04-12 DIAGNOSIS — M25561 Pain in right knee: Secondary | ICD-10-CM

## 2017-04-12 DIAGNOSIS — Z7982 Long term (current) use of aspirin: Secondary | ICD-10-CM | POA: Insufficient documentation

## 2017-04-12 DIAGNOSIS — G8929 Other chronic pain: Secondary | ICD-10-CM | POA: Insufficient documentation

## 2017-04-12 DIAGNOSIS — F1020 Alcohol dependence, uncomplicated: Secondary | ICD-10-CM | POA: Insufficient documentation

## 2017-04-12 DIAGNOSIS — Z1211 Encounter for screening for malignant neoplasm of colon: Secondary | ICD-10-CM

## 2017-04-12 DIAGNOSIS — M25562 Pain in left knee: Secondary | ICD-10-CM

## 2017-04-12 DIAGNOSIS — I10 Essential (primary) hypertension: Secondary | ICD-10-CM | POA: Insufficient documentation

## 2017-04-12 DIAGNOSIS — L2084 Intrinsic (allergic) eczema: Secondary | ICD-10-CM

## 2017-04-12 DIAGNOSIS — Z79899 Other long term (current) drug therapy: Secondary | ICD-10-CM | POA: Insufficient documentation

## 2017-04-12 DIAGNOSIS — F17210 Nicotine dependence, cigarettes, uncomplicated: Secondary | ICD-10-CM | POA: Insufficient documentation

## 2017-04-12 DIAGNOSIS — J449 Chronic obstructive pulmonary disease, unspecified: Secondary | ICD-10-CM | POA: Insufficient documentation

## 2017-04-12 DIAGNOSIS — F329 Major depressive disorder, single episode, unspecified: Secondary | ICD-10-CM | POA: Insufficient documentation

## 2017-04-12 DIAGNOSIS — L209 Atopic dermatitis, unspecified: Secondary | ICD-10-CM | POA: Insufficient documentation

## 2017-04-12 MED ORDER — TRIAMCINOLONE ACETONIDE 0.1 % EX CREA: 1.0000 "application " | TOPICAL_CREAM | Freq: Two times a day (BID) | CUTANEOUS | 0 refills | Status: DC

## 2017-04-12 MED FILL — TRIAMCINOLONE ACETONIDE 0.1: 0.1 | 15 days supply | Qty: 30 | Fill #0

## 2017-04-12 NOTE — Patient Instructions (Signed)

## 2017-04-12 NOTE — Progress Notes (Signed)
Subjective:  [name], female    DOB: 1955-04-25  Age: 62 y.o. MRN: [id number]  CC: re-establish and skin conditions   HPI Marie Woods is a 62 y/o female with medical history of Asthma, Atopic Dermatitis, and HTN. She presents today for continued skin irritation.   Atopic Dermitis/Eczema: Reports continued rash with small patches on her ankles, bilaterally. Symptoms are worsened with dry air exposure or extreme temperature changes. Reports improvement in itching with the use of Hydroxyzine, last prescribed on 03/1417 visitation. She continues to use lubricant lotion, such as Eucerin. Patient is concerned that she may have plaque psoriasis. She states her sister has the disorder. Denies rashes in other locations, pain, redness or swelling in hands/fingers.   Knee pain: Reports persistent bilateral knee pain (7/10); controlled with Naproxen. Pain is worse in the morning, but improves throughout the day and with movement. Denies redness, swelling, falls, or unsteady gait. She uses a cane to assist with walking. She is scheduled to see the orthopedic provider today at 3:30pm.   Healthcare Maintenance: Patient is due for a colonoscopy for colon cancer screening.   Past Medical History:  Diagnosis Date  . Asthma   . Eczema   . Hypertension    No past surgical history on file.   Patient Active Problem List   Diagnosis Date Noted  . Chronic pain of left knee 04/12/2017  . Chronic pain of right knee 04/12/2017  . ASCUS with positive high risk HPV cervical 05/23/2015  . HCAP (healthcare-associated pneumonia) 03/28/2015  . Healthcare maintenance 03/28/2015  . Loss of weight 01/09/2015  . Alopecia 06/13/2014  . DEPRESSION 04/03/2010  . URI 04/03/2010  . TOBACCO ABUSE 06/28/2009  . ALLERGIC RHINITIS 06/28/2009  . COPD 06/28/2009  . ALCOHOLISM 01/10/2009  . Essential hypertension 01/10/2009  . Asthma 01/10/2009  . CELLULITIS, LEG, RIGHT 01/10/2009  . Dermatitis,  atopic 01/10/2009   Social History   Socioeconomic History  . Marital status: Single    Spouse name: Not on file  . Number of children: Not on file  . Years of education: Not on file  . Highest education level: Not on file  Social Needs  . Financial resource strain: Not on file  . Food insecurity - worry: Not on file  . Food insecurity - inability: Not on file  . Transportation needs - medical: Not on file  . Transportation needs - non-medical: Not on file  Occupational History  . Not on file  Tobacco Use  . Smoking status: Current Every Day Smoker    Packs/day: 0.25    Types: Cigarettes  . Smokeless tobacco: Current User  Substance and Sexual Activity  . Alcohol use: Yes    Comment: 1 beer daily after work  . Drug use: No  . Sexual activity: Not on file  Other Topics Concern  . Not on file  Social History Narrative  . Not on file   Outpatient Medications Prior to Visit  Medication Sig Dispense Refill  . albuterol (PROVENTIL HFA;VENTOLIN HFA) 108 (90 Base) MCG/ACT inhaler Inhale 2 puffs into the lungs every 6 (six) hours as needed for wheezing. 1 each 1  . amLODipine (NORVASC) 10 MG tablet Take 1 tablet (10 mg total) by mouth daily. 90 tablet 3  . Aspirin-Salicylamide-Caffeine (BC HEADACHE POWDER PO) Take 1 each by mouth every 8 (eight) hours as needed (pain). Reported on 05/23/2015    . clobetasol (TEMOVATE) 0.05 % GEL APPLY 1 APPLICATION TOPICALLY 2 TIMES  DAILY 60 each 0  . hydrOXYzine (ATARAX/VISTARIL) 25 MG tablet Take 1 tablet (25 mg total) by mouth every 8 (eight) hours as needed. 60 tablet 2  . loratadine (CLARITIN) 10 MG tablet Take 1 tablet (10 mg total) by mouth daily as needed for allergies. 30 tablet 3  . naproxen (NAPROSYN) 500 MG tablet Take 1 tablet (500 mg total) by mouth 2 (two) times daily with a meal. X 10 days then prn pain 60 tablet 1  . neomycin-bacitracin-polymyxin (NEOSPORIN) ointment Apply 1 application topically every 12 (twelve) hours as needed for  wound care. Reported on 05/23/2015    . predniSONE (DELTASONE) 20 MG tablet Take 3 tabs daily x 2 days, 2 tabs daily x 2 days then 1 tab daily for 3 days. (Patient not taking: Reported on 03/18/2017) 13 tablet 0  . Selenium Sulfide 2.25 % SHAM Apply 1 each topically 2 (two) times a week. (Patient not taking: Reported on 03/18/2017) 180 mL 1  . Skin Protectants, Misc. (EUCERIN) cream Apply topically as needed for dry skin. (Patient not taking: Reported on 03/18/2017) 454 g 0   No facility-administered medications prior to visit.    No Known Allergies  ROS Review of Systems  Constitutional: Negative for activity change, appetite change, chills, fatigue and fever.  Respiratory: Negative for cough, chest tightness, shortness of breath and wheezing.   Cardiovascular: Negative for chest pain, palpitations and leg swelling.  Musculoskeletal: Positive for arthralgias (knee, bilaterally). Negative for gait problem (uses a cane for ambulatory device) and myalgias.  Skin: Positive for rash (ankle, bilaterally). Negative for color change and wound.  Neurological: Negative for weakness and numbness.  Psychiatric/Behavioral: Negative for dysphoric mood and sleep disturbance.  All other systems reviewed and are negative.  Objective:  BP 135/86   Pulse 87   Temp 98.1 F (36.7 C) (Oral)   Resp 16   Ht 5\' 8"  (1.727 m)   Wt 238 lb 12.8 oz (108.3 kg)   SpO2 100%   BMI 36.31 kg/m   BP/Weight 04/12/2017 03/18/2017 06/17/2016  Systolic BP 135 128 119  Diastolic BP 86 85 76  Wt. (Lbs) 238.8 236.6 204  BMI 36.31 37.06 31.95   Physical Exam  Constitutional: She is oriented to person, place, and time. She appears well-developed and well-nourished. No distress.  HENT:  Head: Normocephalic and atraumatic.  Eyes: Conjunctivae and EOM are normal.  Neck: Normal range of motion.  Cardiovascular: Exam reveals no gallop and no friction rub.  No murmur heard. Pulmonary/Chest: Effort normal and breath sounds  normal. No respiratory distress. She exhibits no tenderness.  Abdominal: Soft. There is no tenderness.  Musculoskeletal: Normal range of motion. She exhibits tenderness (knees, bilaterally). She exhibits no edema or deformity.  Neurological: She is alert and oriented to person, place, and time. She has normal strength.  Skin: Skin is warm, dry and intact. Rash noted. No erythema (small patches on the lateral ankle, bilaterally).  Psychiatric: She has a normal mood and affect. Her speech is normal and behavior is normal. Judgment and thought content normal.  Nursing note and vitals reviewed.  Assessment & Plan:   1. Intrinsic atopic dermatitis, uncontrolled - Reports improvement in itching with the use of Hydroxyzine. She continues to have dry patches with mild irritation; exacerbated by dry air and extreme temperature changes.  - Prescribed a topical steroid. Instructed to continue using Eucerin for dry skin.  - F/u as needed.  - triamcinolone cream (KENALOG) 0.1 %; Apply 1 application topically  2 (two) times daily.  Dispense: 30 g; Refill: 0  2. Acute pain of both knees, controlled - Reports moderate control of bilateral knee pain with Naproxen. Denies falls or unsteady gait. She is schedule to see Orthopedics today at 3:30.  - Continue medication regimen for pain control.  - F/u with Orthopedics as scheduled or as needed in clinic.   3. Colon cancer screening - Patient agreeable for colon cancer screening. Referral made with GI.  - Ambulatory referral to Gastroenterology  Meds ordered this encounter  Medications  . triamcinolone cream (KENALOG) 0.1 %    Sig: Apply 1 application topically 2 (two) times daily.    Dispense:  30 g    Refill:  0    Follow-up: Return if symptoms worsen or fail to improve.   Starlett Pehrson H. Marice Potter, AGNP-DNP student

## 2017-04-12 NOTE — Progress Notes (Signed)
Office Visit Note   Patient: Marie Woods           Date of Birth: [birth year]           MRN: [id number] Visit Date: 04/12/2017              Requested by: Anders Simmonds, PA-C [address] [place], [place] [address] PCP: Marcine Matar, MD   Assessment & Plan: Visit Diagnoses:  1. Chronic pain of left knee   2. Chronic pain of right knee     Plan: I do feel that it is worth her trying steroid injections in both her knees.  The arthritis is not bad enough to pursue any other treatment and is only mild patellofemoral changes.  Certainly weight loss and flexing the exercise will help we talked her about this in detail in length and she says she can try the quad strengthening exercises.  She is a perfect candidate for steroid shots in both knees and I explained the risk and benefits of these injections and she tolerated these well.  I do feel that she would also benefit from either to Tumeric or glucosamine and I gave her information about these.  All questions concerns were answered and addressed.  She will follow-up as needed.  She tolerated steroid injections well.  Follow-Up Instructions: Return if symptoms worsen or fail to improve.   Orders:  Orders Placed This Encounter  Procedures  . Large Joint Inj   No orders of the defined types were placed in this encounter.     Procedures: Large Joint Inj: bilateral knee on 04/12/2017 4:32 PM Indications: diagnostic evaluation and pain Details: 22 G 1.5 in needle, superolateral approach  Arthrogram: No  Outcome: tolerated well, no immediate complications Procedure, treatment alternatives, risks and benefits explained, specific risks discussed. Consent was given by the patient. Immediately prior to procedure a time out was called to verify the correct patient, procedure, equipment, support staff and site/side marked as required. Patient was prepped and draped in the usual sterile fashion.       Clinical Data: No  additional findings.   Subjective: Chief Complaint  Patient presents with  . Left Knee - Pain  . Right Knee - Pain  Patient is a very pleasant 62 year old [facility].  She states that the knee have been hurting her for about 3 months now with no known injury.  She does ambulate with a cane.s she is moderately obese.  She was started on approximately she says that is been somewhat helpful.  She says standing and going up and down stairs are more painful to her.  She does not feel like the knees are locking on her but occasionally she feels like they are giving way.  She is not a diabetic.  She denies any injury that she is aware of.  She is never had any type of injections in her knees.  She currently denies any headache, chest pain, shortness of breath, fever, chills, nausea, vomiting.  HPI  Review of Systems She currently denies any headache, chest pain, shortness of breath, fever, chills, nausea, vomiting.  Objective: Vital Signs: There were no vitals taken for this visit.  Physical Exam She is alert and oriented x3 and in no acute distress Ortho Exam Examination of both her knees show no significant effusion.  Both knees have slight valgus malalignment when she stands.  Both knees are ligamentously stable  on exam with good range of motion but do have patellofemoral crepitation. Specialty Comments:  No specialty comments available.  Imaging: No results found. Acute findings otherwise.  I independently reviewed x-rays of both knees and show only mild patellofemoral arthritic changes with no medial lateral joint space is maintained well on both knees.  PMFS History: Patient Active Problem List   Diagnosis Date Noted  . Chronic pain of left knee 04/12/2017  . Chronic pain of right knee 04/12/2017  . ASCUS with positive high risk HPV cervical 05/23/2015  . HCAP (healthcare-associated pneumonia) 03/28/2015  .  Healthcare maintenance 03/28/2015  . Loss of weight 01/09/2015  . Alopecia 06/13/2014  . DEPRESSION 04/03/2010  . URI 04/03/2010  . TOBACCO ABUSE 06/28/2009  . ALLERGIC RHINITIS 06/28/2009  . COPD 06/28/2009  . ALCOHOLISM 01/10/2009  . Essential hypertension 01/10/2009  . Asthma 01/10/2009  . CELLULITIS, LEG, RIGHT 01/10/2009  . Dermatitis, atopic 01/10/2009   Past Medical History:  Diagnosis Date  . Asthma   . Eczema   . Hypertension     Family History  Problem Relation Age of Onset  . Cancer Mother   . Heart disease Mother   . Heart disease Father     History reviewed. No pertinent surgical history. Social History   Occupational History  . Not on file  Tobacco Use  . Smoking status: Current Every Day Smoker    Packs/day: 0.25    Types: Cigarettes  . Smokeless tobacco: Current User  Substance and Sexual Activity  . Alcohol use: Yes    Comment: 1 beer daily after work  . Drug use: No  . Sexual activity: Not on file

## 2017-04-13 ENCOUNTER — Telehealth: Payer: Self-pay | Admitting: Internal Medicine

## 2017-04-13 NOTE — Telephone Encounter (Signed)
Pt is aware of rx 

## 2017-04-19 ENCOUNTER — Encounter: Payer: Self-pay | Admitting: Gastroenterology

## 2017-05-10 MED FILL — hydrOXYzine HCL 25 MG TABS: 25 | 20 days supply | Qty: 60 | Fill #1

## 2017-05-10 MED FILL — NAPROXEN 500 MG TABLET: 500 | 30 days supply | Qty: 60 | Fill #1

## 2017-05-11 ENCOUNTER — Ambulatory Visit: Payer: Self-pay

## 2017-06-04 MED FILL — hydrOXYzine HCL 25 MG TABS: 25 | 20 days supply | Qty: 60 | Fill #2

## 2017-06-04 MED FILL — AMLODIPINE BESYLATE 10 MG T: 10 | 30 days supply | Qty: 30 | Fill #8

## 2017-06-04 MED FILL — CLOBETASOL 0.05% GEL: 0.05 | 30 days supply | Qty: 60 | Fill #2

## 2017-07-06 ENCOUNTER — Other Ambulatory Visit: Payer: Self-pay | Admitting: Physician Assistant

## 2017-07-06 ENCOUNTER — Other Ambulatory Visit: Payer: Self-pay | Admitting: Internal Medicine

## 2017-07-06 DIAGNOSIS — L2084 Intrinsic (allergic) eczema: Secondary | ICD-10-CM

## 2017-07-06 DIAGNOSIS — I10 Essential (primary) hypertension: Secondary | ICD-10-CM

## 2017-07-07 MED FILL — AMLODIPINE BESYLATE 10 MG T: 10 | 30 days supply | Qty: 30 | Fill #0

## 2017-07-07 MED FILL — hydrOXYzine HCL 25 MG TABS: 25 | 20 days supply | Qty: 60 | Fill #0

## 2017-08-03 ENCOUNTER — Other Ambulatory Visit: Payer: Self-pay

## 2017-08-03 DIAGNOSIS — L2084 Intrinsic (allergic) eczema: Secondary | ICD-10-CM

## 2017-08-03 MED ORDER — HYDROXYZINE HCL 25 MG PO TABS: 25.0000 mg | ORAL_TABLET | Freq: Two times a day (BID) | ORAL | 0 refills | Status: DC | PRN

## 2017-08-03 MED ORDER — CLOBETASOL PROPIONATE 0.05 % EX GEL: CUTANEOUS | 0 refills | Status: DC

## 2017-08-03 MED FILL — AMLODIPINE BESYLATE 10 MG T: 10 | 30 days supply | Qty: 30 | Fill #1

## 2017-08-04 MED FILL — hydrOXYzine HCL 25 MG TABS: 25 | 15 days supply | Qty: 30 | Fill #0

## 2017-08-04 MED FILL — CLOBETASOL 0.05% GEL: 0.05 | 14 days supply | Qty: 60 | Fill #0

## 2017-09-01 ENCOUNTER — Other Ambulatory Visit: Payer: Self-pay | Admitting: Internal Medicine

## 2017-09-01 DIAGNOSIS — L2084 Intrinsic (allergic) eczema: Secondary | ICD-10-CM

## 2017-09-02 MED FILL — hydrOXYzine HCL 25 MG TABS: 25 | 15 days supply | Qty: 30 | Fill #0

## 2017-10-01 ENCOUNTER — Other Ambulatory Visit: Payer: Self-pay | Admitting: Internal Medicine

## 2017-10-01 DIAGNOSIS — L2084 Intrinsic (allergic) eczema: Secondary | ICD-10-CM

## 2017-10-01 MED FILL — AMLODIPINE BESYLATE 10 MG T: 10 | 30 days supply | Qty: 30 | Fill #2

## 2017-10-06 ENCOUNTER — Other Ambulatory Visit: Payer: Self-pay | Admitting: Internal Medicine

## 2017-10-06 DIAGNOSIS — L2084 Intrinsic (allergic) eczema: Secondary | ICD-10-CM

## 2017-10-07 ENCOUNTER — Other Ambulatory Visit: Payer: Self-pay

## 2017-10-08 ENCOUNTER — Other Ambulatory Visit: Payer: Self-pay | Admitting: Internal Medicine

## 2017-10-08 DIAGNOSIS — L2084 Intrinsic (allergic) eczema: Secondary | ICD-10-CM

## 2017-11-04 ENCOUNTER — Other Ambulatory Visit: Payer: Self-pay | Admitting: Internal Medicine

## 2017-11-04 DIAGNOSIS — I10 Essential (primary) hypertension: Secondary | ICD-10-CM

## 2017-11-09 ENCOUNTER — Other Ambulatory Visit: Payer: Self-pay | Admitting: Internal Medicine

## 2017-11-09 DIAGNOSIS — I10 Essential (primary) hypertension: Secondary | ICD-10-CM

## 2017-11-09 DIAGNOSIS — L2084 Intrinsic (allergic) eczema: Secondary | ICD-10-CM

## 2017-11-09 NOTE — Telephone Encounter (Signed)
Patient called requesting a refill on the following medications:  clobetasol (TEMOVATE) 0.05 % GEL hydrOXYzine (ATARAX/VISTARIL) 25 MG tablet amLODipine (NORVASC) 10 MG tablet  Patient uses [facility] pharmacy and has scheduled an appt. For 11/26/17 with PCP. Please f/u

## 2017-11-12 MED ORDER — HYDROXYZINE HCL 25 MG PO TABS: ORAL_TABLET | ORAL | 0 refills | Status: DC

## 2017-11-12 MED ORDER — CLOBETASOL PROPIONATE 0.05 % EX GEL: CUTANEOUS | 0 refills | Status: DC

## 2017-11-12 MED ORDER — AMLODIPINE BESYLATE 10 MG PO TABS: 10.0000 mg | ORAL_TABLET | Freq: Every day | ORAL | 0 refills | Status: DC

## 2017-11-12 MED FILL — CLOBETASOL 0.05% GEL: 0.05 | 7 days supply | Qty: 15 | Fill #0

## 2017-11-12 MED FILL — hydrOXYzine HCL 25 MG TABS: 25 | 30 days supply | Qty: 30 | Fill #0

## 2017-11-12 MED FILL — AMLODIPINE BESYLATE 10 MG T: 10 | 30 days supply | Qty: 30 | Fill #0

## 2017-11-26 ENCOUNTER — Ambulatory Visit: Payer: Self-pay | Admitting: Internal Medicine

## 2018-01-17 MED FILL — AMLODIPINE BESYLATE 10 MG T: 10 | 30 days supply | Qty: 30 | Fill #1

## 2018-02-16 ENCOUNTER — Emergency Department (HOSPITAL_COMMUNITY)
Admission: EM | Admit: 2018-02-16 | Discharge: 2018-02-16 | Disposition: A | Discharge status: Home / Self Care | Payer: Self-pay | Attending: Emergency Medicine | Admitting: Emergency Medicine

## 2018-02-16 ENCOUNTER — Other Ambulatory Visit: Payer: Self-pay

## 2018-02-16 ENCOUNTER — Encounter (HOSPITAL_COMMUNITY): Payer: Self-pay

## 2018-02-16 DIAGNOSIS — Z79899 Other long term (current) drug therapy: Secondary | ICD-10-CM | POA: Insufficient documentation

## 2018-02-16 DIAGNOSIS — L03115 Cellulitis of right lower limb: Secondary | ICD-10-CM | POA: Insufficient documentation

## 2018-02-16 DIAGNOSIS — L2084 Intrinsic (allergic) eczema: Secondary | ICD-10-CM

## 2018-02-16 DIAGNOSIS — L03116 Cellulitis of left lower limb: Secondary | ICD-10-CM | POA: Insufficient documentation

## 2018-02-16 DIAGNOSIS — I10 Essential (primary) hypertension: Secondary | ICD-10-CM | POA: Insufficient documentation

## 2018-02-16 DIAGNOSIS — L259 Unspecified contact dermatitis, unspecified cause: Secondary | ICD-10-CM | POA: Insufficient documentation

## 2018-02-16 DIAGNOSIS — F17210 Nicotine dependence, cigarettes, uncomplicated: Secondary | ICD-10-CM | POA: Insufficient documentation

## 2018-02-16 DIAGNOSIS — L03119 Cellulitis of unspecified part of limb: Secondary | ICD-10-CM

## 2018-02-16 DIAGNOSIS — L309 Dermatitis, unspecified: Secondary | ICD-10-CM

## 2018-02-16 DIAGNOSIS — J45909 Unspecified asthma, uncomplicated: Secondary | ICD-10-CM | POA: Insufficient documentation

## 2018-02-16 MED ORDER — TRIAMCINOLONE ACETONIDE 0.1 % EX CREA: 1.0000 "application " | TOPICAL_CREAM | Freq: Two times a day (BID) | CUTANEOUS | 1 refills | Status: DC

## 2018-02-16 MED ORDER — PREDNISONE 20 MG PO TABS: ORAL_TABLET | ORAL | 0 refills | Status: DC

## 2018-02-16 MED ORDER — CLOBETASOL PROPIONATE 0.05 % EX GEL: CUTANEOUS | 1 refills | Status: DC

## 2018-02-16 MED ORDER — CEPHALEXIN 500 MG PO CAPS: 1000.0000 mg | ORAL_CAPSULE | Freq: Two times a day (BID) | ORAL | 0 refills | Status: DC

## 2018-02-16 MED FILL — CLOBETASOL 0.05% GEL: 0.05 | 20 days supply | Qty: 15 | Fill #0

## 2018-02-16 MED FILL — predniSONE 20 MG TABS: 20 | 4 days supply | Qty: 8 | Fill #0

## 2018-02-16 MED FILL — CEPHALEXIN 500 MG CAPSULE: 500 | 7 days supply | Qty: 28 | Fill #0

## 2018-02-16 MED FILL — AMLODIPINE BESYLATE 10 MG T: 10 | 30 days supply | Qty: 30 | Fill #2

## 2018-02-16 MED FILL — TRIAMCINOLONE ACETONIDE 0.1: 0.1 | 15 days supply | Qty: 30 | Fill #0

## 2018-02-16 NOTE — ED Provider Notes (Signed)
[facility] Provider Note   CSN: [id number] Arrival date & time: 02/16/18  [id number]     History   Chief Complaint No chief complaint on file.   HPI Marie Woods is a 63 y.o. female.  HPI Patient presents with increased redness and swelling of both lower extremities.  She reports this started approximately 5 days ago.  She reports that she has restarted her triamcinolone cream which she is about to run out out of and clobetasol.  She reports the swelling has improved since yesterday.  She reports both feet were very swollen.  She continues to have a lot of redness and soreness of both legs.  She denies pain in the calves.  She feels that she has had subjective fever.  No documented fever.  No generalized weakness, nausea or vomiting.  Review of systems is otherwise negative.  Patient reports that in the past she has experienced most improvement with the triamcinolone and prednisone.  She reports that 4 days of prednisone has been very helpful when she has had significant flares of eczema. Past Medical History:  Diagnosis Date  . Asthma   . Eczema   . Hypertension     Patient Active Problem List   Diagnosis Date Noted  . Chronic pain of left knee 04/12/2017  . Chronic pain of right knee 04/12/2017  . ASCUS with positive high risk HPV cervical 05/23/2015  . HCAP (healthcare-associated pneumonia) 03/28/2015  . Healthcare maintenance 03/28/2015  . Loss of weight 01/09/2015  . Alopecia 06/13/2014  . DEPRESSION 04/03/2010  . URI 04/03/2010  . TOBACCO ABUSE 06/28/2009  . ALLERGIC RHINITIS 06/28/2009  . COPD 06/28/2009  . ALCOHOLISM 01/10/2009  . Essential hypertension 01/10/2009  . Asthma 01/10/2009  . CELLULITIS, LEG, RIGHT 01/10/2009  . Dermatitis, atopic 01/10/2009    History reviewed. No pertinent surgical history.   OB History   No obstetric history on file.      Home Medications    Prior to Admission medications   Medication  Sig Start Date End Date Taking? Authorizing Provider  albuterol (PROVENTIL HFA;VENTOLIN HFA) 108 (90 Base) MCG/ACT inhaler Inhale 2 puffs into the lungs every 6 (six) hours as needed for wheezing. 02/15/15   Hoy RegisterNewlin, Enobong, MD  amLODipine (NORVASC) 10 MG tablet Take 1 tablet (10 mg total) by mouth daily. 11/12/17   Marcine MatarJohnson, Deborah B, MD  Aspirin-Salicylamide-Caffeine (BC HEADACHE POWDER PO) Take 1 each by mouth every 8 (eight) hours as needed (pain). Reported on 05/23/2015    [provider]  cephALEXin (KEFLEX) 500 MG capsule Take 2 capsules (1,000 mg total) by mouth 2 (two) times daily. 02/16/18   Arby BarrettePfeiffer, Olajuwon Fosdick, MD  clobetasol (TEMOVATE) 0.05 % GEL APPLY 1 APPLICATION TOPICALLY 2 TIMES DAILY 02/16/18   Arby BarrettePfeiffer, Kateria Cutrona, MD  hydrOXYzine (ATARAX/VISTARIL) 25 MG tablet TAKE 1 TABLET BY MOUTH 2 (TWO) TIMES DAILY AS NEEDED. 11/12/17   Marcine MatarJohnson, Deborah B, MD  loratadine (CLARITIN) 10 MG tablet Take 1 tablet (10 mg total) by mouth daily as needed for allergies. 03/18/17   Anders SimmondsMcClung, Angela M, PA-C  naproxen (NAPROSYN) 500 MG tablet Take 1 tablet (500 mg total) by mouth 2 (two) times daily with a meal. X 10 days then prn pain 03/18/17   Anders SimmondsMcClung, Angela M, PA-C  neomycin-bacitracin-polymyxin (NEOSPORIN) ointment Apply 1 application topically every 12 (twelve) hours as needed for wound care. Reported on 05/23/2015    [provider]  predniSONE (DELTASONE) 20 MG tablet Take 3 tabs daily  x 2 days, 2 tabs daily x 2 days then 1 tab daily for 3 days. Patient not taking: Reported on 03/18/2017 06/17/16   Quentin Angst, MD  predniSONE (DELTASONE) 20 MG tablet 2 tabs po daily x 4 days 02/16/18   Arby Barrette, MD  Selenium Sulfide 2.25 % SHAM Apply 1 each topically 2 (two) times a week. Patient not taking: Reported on 03/18/2017 06/13/14   Hoy Register, MD  Skin Protectants, Misc. (EUCERIN) cream Apply topically as needed for dry skin. Patient not taking: Reported on 03/18/2017 05/28/14   Felicie Morn, NP  triamcinolone cream (KENALOG) 0.1 % Apply 1 application topically 2 (two) times daily. 02/16/18   Arby Barrette, MD    Family History Family History  Problem Relation Age of Onset  . Cancer Mother   . Heart disease Mother   . Heart disease Father     Social History Social History   Tobacco Use  . Smoking status: Current Every Day Smoker    Packs/day: 0.25    Types: Cigarettes  . Smokeless tobacco: Current User  Substance Use Topics  . Alcohol use: Yes    Comment: 1 beer daily after work  . Drug use: No     Allergies   Patient has no known allergies.   Review of Systems Review of Systems 10 Systems reviewed and are negative for acute change except as noted in the HPI.   Physical Exam Updated Vital Signs BP (!) 145/87 (BP Location: Right Arm)   Pulse 89   Temp 97.9 F (36.6 C) (Oral)   Resp 16   SpO2 99%   Physical Exam Constitutional:      Comments: Patient is alert and clinically well in appearance.  No acute distress.  HENT:     Head: Normocephalic and atraumatic.  Cardiovascular:     Rate and Rhythm: Normal rate and regular rhythm.  Pulmonary:     Effort: Pulmonary effort is normal.     Comments: Occasional expiratory wheeze.  Good airflow throughout. Abdominal:     General: There is no distension.     Palpations: Abdomen is soft.     Tenderness: There is no abdominal tenderness.  Musculoskeletal: Normal range of motion.        General: Swelling and tenderness present.     Comments: Diffuse erythema bilateral lower extremities from mid tibial area over ankles and feet.  Thick scaling.  Calves are soft and nontender.  See attached images.  Bilateral dorsalis pedis pulses symmetric and strong.  Skin:    General: Skin is warm and dry.     Findings: Rash present.  Neurological:     General: No focal deficit present.     Mental Status: She is oriented to person, place, and time.     Coordination: Coordination normal.  Psychiatric:         Mood and Affect: Mood normal.          ED Treatments / Results  Labs (all labs ordered are listed, but only abnormal results are displayed) Labs Reviewed - No data to display  EKG None  Radiology No results found.  Procedures Procedures (including critical care time)  Medications Ordered in ED Medications - No data to display   Initial Impression / Assessment and Plan / ED Course  I have reviewed the triage vital signs and the nursing notes.  Pertinent labs & imaging results that were available during my care of the patient were reviewed by me and  considered in my medical decision making (see chart for details).    Patient presents as outlined above.  She is alert and nontoxic.  No signs of constitutional illness.  At this time will initiate Keflex for suspected secondary cellulitis with exacerbation of eczema.  Final Clinical Impressions(s) / ED Diagnoses   Final diagnoses:  Eczema, unspecified type  Cellulitis of lower extremity, unspecified laterality    ED Discharge Orders         Ordered    triamcinolone cream (KENALOG) 0.1 %  2 times daily     02/16/18 0837    clobetasol (TEMOVATE) 0.05 % GEL    Note to Pharmacy:  Needs to be seen prior to next refill request.   02/16/18 0837    predniSONE (DELTASONE) 20 MG tablet     02/16/18 0837    cephALEXin (KEFLEX) 500 MG capsule  2 times daily     02/16/18 [id number]           Arby BarrettePfeiffer, Bartolo Montanye, MD 02/16/18 [phone]

## 2018-02-16 NOTE — ED Triage Notes (Addendum)
Per pt: the last few days she has had lower extremity swelling and pain. Pt states she does not have a hx of heart failure. Pt states that she has been taking BC powder for the pain and it has been helping "a little bit". Pt has been using her cane to walk since she has had the swelling. Pts bilateral legs are hot to the touch, red, and cracked. The pt states that fluid has been coming out of her legs. She states that she has also been having fevers "on and off". Pt states that sometimes she feels like her face, nose, and ears swell up, "I wonder if I am allergic to something and it triggered it".

## 2018-03-07 NOTE — Progress Notes (Signed)
Subjective:    [name], female    DOB: [birth year], 63 y.o.   MRN: [id number]  63 y.o.F here for ED f/u of eczema both lower extremities  The patient has been previously diagnosed with atopic dermatitis of the lower extremities exacerbated by skin dryness.  The patient was last seen in March 2019 for this condition and prescribed steroid cream and hydroxyzine.  The symptoms that worsened over this winter with drying of the skin  The patient went to the emergency room on 15 January and there was treated with prednisone and steroid topical and this is improved the patient to some degree however she has run out of her hydroxyzine still has some itching in the lower extremities.  The patient also needs to reestablish care for primary care.  She is needing refills on her hypertensive meds.  She also is in need of a flu vaccine and hepatitis screening study she also needs a stool card for colon cancer screening  The patient continues to smoke about a half a pack of cigarettes daily.  She has a listed diagnosis of asthma.  She uses an albuterol inhaler only as needed.  This is not been a problem recently.1    Past Medical History:  Diagnosis Date  . Asthma   . Eczema   . Hypertension      Family History  Problem Relation Age of Onset  . Cancer Mother   . Heart disease Mother   . Heart disease Father      Social History   Socioeconomic History  . Marital status: Single    Spouse name: Not on file  . Number of children: Not on file  . Years of education: Not on file  . Highest education level: Not on file  Occupational History  . Not on file  Social Needs  . Financial resource strain: Not on file  . Food insecurity:    Worry: Not on file    Inability: Not on file  . Transportation needs:    Medical: Not on file    Non-medical: Not on file  Tobacco Use  . Smoking status: Current Every Day Smoker    Packs/day: 0.25    Types: Cigarettes  . Smokeless  tobacco: Current User  Substance and Sexual Activity  . Alcohol use: Yes    Comment: 1 beer daily after work  . Drug use: No  . Sexual activity: Not on file  Lifestyle  . Physical activity:    Days per week: Not on file    Minutes per session: Not on file  . Stress: Not on file  Relationships  . Social connections:    Talks on phone: Not on file    Gets together: Not on file    Attends religious service: Not on file    Active member of club or organization: Not on file    Attends meetings of clubs or organizations: Not on file    Relationship status: Not on file  . Intimate partner violence:    Fear of current or ex partner: Not on file    Emotionally abused: Not on file    Physically abused: Not on file    Forced sexual activity: Not on file  Other Topics Concern  . Not on file  Social History Narrative  . Not on file     No Known Allergies   Outpatient Medications Prior to Visit  Medication Sig Dispense Refill  . Aspirin-Salicylamide-Caffeine (BC HEADACHE  POWDER PO) Take 1 each by mouth every 8 (eight) hours as needed (pain). Reported on 05/23/2015    . loratadine (CLARITIN) 10 MG tablet Take 1 tablet (10 mg total) by mouth daily as needed for allergies. 30 tablet 3  . albuterol (PROVENTIL HFA;VENTOLIN HFA) 108 (90 Base) MCG/ACT inhaler Inhale 2 puffs into the lungs every 6 (six) hours as needed for wheezing. 1 each 1  . amLODipine (NORVASC) 10 MG tablet Take 1 tablet (10 mg total) by mouth daily. 90 tablet 0  . cephALEXin (KEFLEX) 500 MG capsule Take 2 capsules (1,000 mg total) by mouth 2 (two) times daily. 28 capsule 0  . clobetasol (TEMOVATE) 0.05 % GEL APPLY 1 APPLICATION TOPICALLY 2 TIMES DAILY 60 each 1  . triamcinolone cream (KENALOG) 0.1 % Apply 1 application topically 2 (two) times daily. 30 g 1  . hydrOXYzine (ATARAX/VISTARIL) 25 MG tablet TAKE 1 TABLET BY MOUTH 2 (TWO) TIMES DAILY AS NEEDED. (Patient not taking: Reported on 03/08/2018) 30 tablet 0  . naproxen  (NAPROSYN) 500 MG tablet Take 1 tablet (500 mg total) by mouth 2 (two) times daily with a meal. X 10 days then prn pain 60 tablet 1  . neomycin-bacitracin-polymyxin (NEOSPORIN) ointment Apply 1 application topically every 12 (twelve) hours as needed for wound care. Reported on 05/23/2015    . predniSONE (DELTASONE) 20 MG tablet Take 3 tabs daily x 2 days, 2 tabs daily x 2 days then 1 tab daily for 3 days. (Patient not taking: Reported on 03/18/2017) 13 tablet 0  . predniSONE (DELTASONE) 20 MG tablet 2 tabs po daily x 4 days 8 tablet 0  . Selenium Sulfide 2.25 % SHAM Apply 1 each topically 2 (two) times a week. (Patient not taking: Reported on 03/18/2017) 180 mL 1  . Skin Protectants, Misc. (EUCERIN) cream Apply topically as needed for dry skin. (Patient not taking: Reported on 03/18/2017) 454 g 0   No facility-administered medications prior to visit.       Review of Systems  Constitutional: Negative.   HENT: Negative.   Eyes: Negative.   Respiratory: Negative.   Cardiovascular: Negative.   Gastrointestinal: Negative.   Genitourinary: Negative.   Musculoskeletal:       Knee pain  Skin: Positive for rash.  Neurological: Negative.   Hematological: Negative.   Psychiatric/Behavioral: Negative.        Objective:   Physical Exam Vitals:   03/08/18 0914  BP: (!) 145/85  Pulse: 95  Resp: 18  Temp: 97.7 F (36.5 C)  TempSrc: Oral  SpO2: 100%  Weight: 232 lb (105.2 kg)  Height: 5\' 7"  (1.702 m)    Gen: Pleasant, well-nourished, in no distress,  normal affect  ENT: No lesions,  mouth clear,  oropharynx clear, no postnasal drip  Neck: No JVD, no TMG, no carotid bruits  Lungs: No use of accessory muscles, no dullness to percussion, clear without rales or rhonchi  Cardiovascular: RRR, heart sounds normal, no murmur or gallops, no peripheral edema  Abdomen: soft and NT, no HSM,  BS normal  Musculoskeletal: No deformities, no cyanosis or clubbing  Neuro: alert, non focal  Skin:  Warm, no lesions ,   Bilateral rash in LE c/w dermatitis  Skin very dry   No results found.        Assessment & Plan:  I personally reviewed all images and lab data in the [facility] system as well as any outside material available during this office visit and agree with the  radiology impressions.   Essential hypertension Hypertension with marginal control but need for refills of current medications which is the amlodipine  Asthma History of mild intermittent asthma stable at this time  We will continue albuterol as needed  Dermatitis, atopic History of chronic atopic dermatitis in the lower extremities which is a continuing issue  Dry skin exacerbates  I refilled the steroid gel and cream which the patient will apply to a skin moisturizer to the lower extremities twice daily  Hydroxyzine as needed was also refilled  TOBACCO ABUSE Ongoing tobacco use  Smoking cessation counseling was given to the patient which lasted 5 to 10 minutes  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  Diagnoses and all orders for this visit:  Colon cancer screening -     Fecal occult blood, imunochemical  Intrinsic atopic dermatitis -     triamcinolone cream (KENALOG) 0.1 %; Apply 1 application topically 2 (two) times daily. (Patient not taking: Reported on 03/08/2018) -     hydrOXYzine (ATARAX/VISTARIL) 25 MG tablet; TAKE 1 TABLET BY MOUTH 2 (TWO) TIMES DAILY AS NEEDED. (Patient not taking: Reported on 03/08/2018)  Essential hypertension -     amLODipine (NORVASC) 10 MG tablet; Take 1 tablet (10 mg total) by mouth daily.  Mild intermittent asthma without complication -     albuterol (PROVENTIL HFA;VENTOLIN HFA) 108 (90 Base) MCG/ACT inhaler; Inhale 2 puffs into the lungs every 6 (six) hours as needed for wheezing.  Need for hepatitis C screening test -     Hepatitis C antibody  TOBACCO  ABUSE  History of alcohol use  Other orders -     nicotine polacrilex (NICOTINE MINI) 2 MG lozenge; Take 1 lozenge (2 mg total) by mouth as needed for smoking cessation. -     clobetasol (TEMOVATE) 0.05 % GEL; APPLY 1 APPLICATION TOPICALLY 2 TIMES DAILY   The patient also is due up for a hepatitis screen for hepatitis C  The patient agreed to a flu vaccine today in the office  Patient also agreed to obtain a stool card for colon cancer screening

## 2018-03-08 ENCOUNTER — Ambulatory Visit: Payer: Self-pay | Attending: Critical Care Medicine | Admitting: Critical Care Medicine

## 2018-03-08 ENCOUNTER — Encounter: Payer: Self-pay | Admitting: Critical Care Medicine

## 2018-03-08 VITALS — BP 145/85 | HR 95 | Temp 97.7°F | Resp 18 | Ht 67.0 in | Wt 232.0 lb

## 2018-03-08 DIAGNOSIS — Z23 Encounter for immunization: Secondary | ICD-10-CM | POA: Insufficient documentation

## 2018-03-08 DIAGNOSIS — Z8249 Family history of ischemic heart disease and other diseases of the circulatory system: Secondary | ICD-10-CM | POA: Insufficient documentation

## 2018-03-08 DIAGNOSIS — Z87898 Personal history of other specified conditions: Secondary | ICD-10-CM

## 2018-03-08 DIAGNOSIS — I10 Essential (primary) hypertension: Secondary | ICD-10-CM | POA: Insufficient documentation

## 2018-03-08 DIAGNOSIS — F17200 Nicotine dependence, unspecified, uncomplicated: Secondary | ICD-10-CM

## 2018-03-08 DIAGNOSIS — Z1211 Encounter for screening for malignant neoplasm of colon: Secondary | ICD-10-CM | POA: Insufficient documentation

## 2018-03-08 DIAGNOSIS — Z809 Family history of malignant neoplasm, unspecified: Secondary | ICD-10-CM | POA: Insufficient documentation

## 2018-03-08 DIAGNOSIS — Z79899 Other long term (current) drug therapy: Secondary | ICD-10-CM | POA: Insufficient documentation

## 2018-03-08 DIAGNOSIS — L209 Atopic dermatitis, unspecified: Secondary | ICD-10-CM | POA: Insufficient documentation

## 2018-03-08 DIAGNOSIS — L2084 Intrinsic (allergic) eczema: Secondary | ICD-10-CM

## 2018-03-08 DIAGNOSIS — J4520 Mild intermittent asthma, uncomplicated: Secondary | ICD-10-CM | POA: Insufficient documentation

## 2018-03-08 DIAGNOSIS — Z1159 Encounter for screening for other viral diseases: Secondary | ICD-10-CM

## 2018-03-08 DIAGNOSIS — F17210 Nicotine dependence, cigarettes, uncomplicated: Secondary | ICD-10-CM | POA: Insufficient documentation

## 2018-03-08 DIAGNOSIS — Z7982 Long term (current) use of aspirin: Secondary | ICD-10-CM | POA: Insufficient documentation

## 2018-03-08 MED ORDER — HYDROXYZINE HCL 25 MG PO TABS: ORAL_TABLET | ORAL | 0 refills | Status: DC

## 2018-03-08 MED ORDER — TRIAMCINOLONE ACETONIDE 0.1 % EX CREA: 1.0000 "application " | TOPICAL_CREAM | Freq: Two times a day (BID) | CUTANEOUS | 1 refills | Status: DC

## 2018-03-08 MED ORDER — NICOTINE POLACRILEX 2 MG MT LOZG: 2.0000 mg | LOZENGE | OROMUCOSAL | 1 refills | Status: DC | PRN

## 2018-03-08 MED ORDER — CLOBETASOL PROPIONATE 0.05 % EX GEL: CUTANEOUS | 1 refills | Status: DC

## 2018-03-08 MED ORDER — ALBUTEROL SULFATE HFA 108 (90 BASE) MCG/ACT IN AERS: 2.0000 | INHALATION_SPRAY | Freq: Four times a day (QID) | RESPIRATORY_TRACT | 1 refills | Status: DC | PRN

## 2018-03-08 MED ORDER — AMLODIPINE BESYLATE 10 MG PO TABS: 10.0000 mg | ORAL_TABLET | Freq: Every day | ORAL | 3 refills | Status: DC

## 2018-03-08 MED FILL — !VENTOLIN HFA INHALER: 108 (90 BAS | 25 days supply | Qty: 18 | Fill #0

## 2018-03-08 MED FILL — TRIAMCINOLONE ACETONIDE 0.1: 0.1 | 15 days supply | Qty: 30 | Fill #0

## 2018-03-08 MED FILL — hydrOXYzine HCL 25 MG TABS: 25 | 15 days supply | Qty: 30 | Fill #0

## 2018-03-08 NOTE — Assessment & Plan Note (Signed)
Hypertension with marginal control but need for refills of current medications which is the amlodipine

## 2018-03-08 NOTE — Assessment & Plan Note (Signed)
Ongoing tobacco use  Smoking cessation counseling was given to the patient which lasted 5 to 10 minutes

## 2018-03-08 NOTE — Assessment & Plan Note (Signed)
History of chronic atopic dermatitis in the lower extremities which is a continuing issue  Dry skin exacerbates  I refilled the steroid gel and cream which the patient will apply to a skin moisturizer to the lower extremities twice daily  Hydroxyzine as needed was also refilled

## 2018-03-08 NOTE — Patient Instructions (Signed)
Refills on your steroid cream was sent to the pharmacy, please apply each of the cream and gel to moisturizer on your skin at least twice daily  Refills on your blood pressure medicine and inhaler along with hydroxyzine was sent to our pharmacy  Focus on smoking cessation and use the nicotine lozenge 2 mg and I would use this 2-3 times a day letting it dissolve in your mouth to reduce smoking  A stool card will be given to you to screen for colon cancer please return this  A flu shot will be given at this visit  Stop by the lab on your way out for a hepatitis C screening blood test  Return in 6 weeks for primary care visit with Dr. Laural Benes

## 2018-03-08 NOTE — Assessment & Plan Note (Signed)
History of mild intermittent asthma stable at this time  We will continue albuterol as needed

## 2018-03-08 NOTE — Assessment & Plan Note (Signed)
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

## 2018-03-09 ENCOUNTER — Telehealth: Payer: Self-pay | Admitting: *Deleted

## 2018-03-09 LAB — HEPATITIS C ANTIBODY

## 2018-03-09 NOTE — Telephone Encounter (Signed)
Notes recorded by Guy Franco, RN on 03/09/2018 at 3:18 PM EST Left message on voicemail to return call. ------  Notes recorded by Storm Frisk, MD on 03/09/2018 at 1:18 PM EST Marie Woods Can you call the pt and tell her the hepatitis study was negative. No hep C

## 2018-03-09 NOTE — Telephone Encounter (Signed)
-----   Message from Storm Frisk, MD sent at 03/09/2018  1:18 PM EST ----- Eustace Pen  Can you call the pt and tell her the hepatitis study was negative.  No hep C

## 2018-03-28 ENCOUNTER — Other Ambulatory Visit: Payer: Self-pay | Admitting: Critical Care Medicine

## 2018-03-28 DIAGNOSIS — L2084 Intrinsic (allergic) eczema: Secondary | ICD-10-CM

## 2018-03-28 MED FILL — AMLODIPINE BESYLATE 10 MG T: 10 | 30 days supply | Qty: 30 | Fill #0

## 2018-03-28 MED FILL — TRIAMCINOLONE ACETONIDE 0.1: 0.1 | 15 days supply | Qty: 30 | Fill #1

## 2018-03-30 ENCOUNTER — Telehealth: Payer: Self-pay | Admitting: Internal Medicine

## 2018-03-30 DIAGNOSIS — L2084 Intrinsic (allergic) eczema: Secondary | ICD-10-CM

## 2018-03-30 MED ORDER — HYDROXYZINE HCL 25 MG PO TABS: ORAL_TABLET | ORAL | 0 refills | Status: DC

## 2018-03-30 NOTE — Telephone Encounter (Signed)
Refill sent.

## 2018-03-30 NOTE — Telephone Encounter (Signed)
New Message   1) Medication(s) Requested (by name):hydrOXYzine (ATARAX/VISTARIL) 25 MG tablet   2) Pharmacy of Choice:[facility]  3) Special Requests:   Approved medications will be sent to the pharmacy, we will reach out if there is an issue.  Requests made after 3pm may not be addressed until the following business day!  If a patient is unsure of the name of the medication(s) please note and ask patient to call back when they are able to provide all info, do not send to responsible party until all information is available!

## 2018-04-10 LAB — FECAL OCCULT BLOOD, IMMUNOCHEMICAL: FECAL OCCULT BLD: NEGATIVE

## 2018-04-11 ENCOUNTER — Telehealth: Payer: Self-pay | Admitting: *Deleted

## 2018-04-11 MED FILL — hydrOXYzine PAMOATE 25 MG C: 25 | 30 days supply | Qty: 30 | Fill #0

## 2018-04-11 NOTE — Telephone Encounter (Signed)
Patient verified DOB Patient is aware of FIT test being negative and to follow up as planned. Patient is aware of hydroxyzine being filled on 03/30/2018 to cover until her next visit. No further questions.

## 2018-04-11 NOTE — Telephone Encounter (Signed)
-----   Message from Storm Frisk, MD sent at 04/11/2018 12:53 PM EDT ----- Threasa Alpha, pls let ms Walters know her fecal blood test was NEGATIVE for blood.  Low colon cancer risk

## 2018-04-19 ENCOUNTER — Ambulatory Visit: Payer: Self-pay | Admitting: Internal Medicine

## 2018-04-26 ENCOUNTER — Other Ambulatory Visit: Payer: Self-pay | Admitting: Internal Medicine

## 2018-04-26 DIAGNOSIS — L2084 Intrinsic (allergic) eczema: Secondary | ICD-10-CM

## 2018-04-26 MED FILL — AMLODIPINE BESYLATE 10 MG T: 10 | 30 days supply | Qty: 30 | Fill #1

## 2018-04-26 MED FILL — TRIAMCINOLONE ACETONIDE 0.1: 0.1 | 15 days supply | Qty: 30 | Fill #1

## 2018-04-26 MED FILL — CLOBETASOL 0.05% GEL: 0.05 | 30 days supply | Qty: 60 | Fill #0

## 2018-05-02 MED FILL — hydrOXYzine PAMOATE 25 MG C: 25 | 30 days supply | Qty: 60 | Fill #0

## 2018-05-31 ENCOUNTER — Other Ambulatory Visit: Payer: Self-pay | Admitting: Pharmacist

## 2018-05-31 MED ORDER — CLOBETASOL PROPIONATE 0.05 % EX OINT: 1.0000 "application " | TOPICAL_OINTMENT | Freq: Two times a day (BID) | CUTANEOUS | 0 refills | Status: DC

## 2018-05-31 MED FILL — CLOBETASOL PROPIONATE 0.05: 0.05 | 30 days supply | Qty: 45 | Fill #0

## 2018-05-31 MED FILL — AMLODIPINE BESYLATE 10 MG T: 10 | 30 days supply | Qty: 30 | Fill #2

## 2018-05-31 MED FILL — hydrOXYzine PAMOATE 25 MG C: 25 | 30 days supply | Qty: 60 | Fill #1

## 2018-05-31 NOTE — Progress Notes (Signed)
Temovate ointment sent to replace the gel d/t availability.

## 2018-06-28 ENCOUNTER — Other Ambulatory Visit: Payer: Self-pay

## 2018-06-28 ENCOUNTER — Ambulatory Visit: Payer: Self-pay | Attending: Internal Medicine | Admitting: Internal Medicine

## 2018-06-28 MED FILL — AMLODIPINE BESYLATE 10 MG T: 10 | 30 days supply | Qty: 30 | Fill #3

## 2018-06-28 MED FILL — hydrOXYzine PAMOATE 25 MG C: 25 | 30 days supply | Qty: 60 | Fill #2

## 2018-08-09 ENCOUNTER — Other Ambulatory Visit: Payer: Self-pay | Admitting: Internal Medicine

## 2018-08-09 DIAGNOSIS — L2084 Intrinsic (allergic) eczema: Secondary | ICD-10-CM

## 2018-08-09 MED FILL — AMLODIPINE BESYLATE 10 MG T: 10 | 30 days supply | Qty: 30 | Fill #4

## 2018-08-09 MED FILL — hydrOXYzine PAMOATE 25 MG C: 25 | 30 days supply | Qty: 60 | Fill #0

## 2018-10-11 ENCOUNTER — Other Ambulatory Visit: Payer: Self-pay

## 2018-10-11 ENCOUNTER — Encounter: Payer: Self-pay | Admitting: Family Medicine

## 2018-10-11 ENCOUNTER — Ambulatory Visit: Payer: Self-pay | Attending: Family Medicine | Admitting: Family Medicine

## 2018-10-11 DIAGNOSIS — J4520 Mild intermittent asthma, uncomplicated: Secondary | ICD-10-CM

## 2018-10-11 DIAGNOSIS — L2084 Intrinsic (allergic) eczema: Secondary | ICD-10-CM

## 2018-10-11 DIAGNOSIS — I10 Essential (primary) hypertension: Secondary | ICD-10-CM

## 2018-10-11 MED ORDER — ALBUTEROL SULFATE HFA 108 (90 BASE) MCG/ACT IN AERS: 2.0000 | INHALATION_SPRAY | Freq: Four times a day (QID) | RESPIRATORY_TRACT | 1 refills | Status: DC | PRN

## 2018-10-11 MED ORDER — HYDROXYZINE PAMOATE 25 MG PO CAPS: ORAL_CAPSULE | ORAL | 1 refills | Status: DC

## 2018-10-11 MED ORDER — AMLODIPINE BESYLATE 10 MG PO TABS: 10.0000 mg | ORAL_TABLET | Freq: Every day | ORAL | 1 refills | Status: DC

## 2018-10-11 MED FILL — hydrOXYzine PAMOATE 25 MG C: 25 | 30 days supply | Qty: 60 | Fill #0

## 2018-10-11 MED FILL — AMLODIPINE BESYLATE 10 MG T: 10 | 90 days supply | Qty: 90 | Fill #0

## 2018-10-11 MED FILL — ALBUTEROL SULFATE HFA 108 (: 108 (90 BAS | 25 days supply | Qty: 18 | Fill #0

## 2018-10-11 NOTE — Progress Notes (Signed)
Patient has been called and DOB has been verified. Patient has been screened and transferred to PCP to start phone visit.     

## 2018-10-11 NOTE — Progress Notes (Signed)
Subjective:  [name], female    DOB: 10-Dec-1955  Age: 63 y.o. MRN: [id number]  CC: Knee Pain   HPI Marie Woods is a 62 year old female patient who was scheduled to have a telehealth visit however she presented in person.  Her PCP is Dr. Wynetta Emery and she is requesting refills of her chronic medications.  She informs me she wanted to have her vitals checked on her blood pressure as well and has had a lot going on including family deaths and she did not remember if her appointment was in person or virtual. She is requesting Voltaren gel prescription which she uses for knee pain however Voltaren gel is over-the-counter and I have informed her accordingly.  Past Medical History:  Diagnosis Date  . Asthma   . Eczema   . Hypertension     History reviewed. No pertinent surgical history.  Family History  Problem Relation Age of Onset  . Cancer Mother   . Heart disease Mother   . Heart disease Father     No Known Allergies  Outpatient Medications Prior to Visit  Medication Sig Dispense Refill  . Aspirin-Salicylamide-Caffeine (BC HEADACHE POWDER PO) Take 1 each by mouth every 8 (eight) hours as needed (pain). Reported on 05/23/2015    . clobetasol ointment (TEMOVATE) 9.51 % Apply 1 application topically 2 (two) times daily. 45 g 0  . loratadine (CLARITIN) 10 MG tablet Take 1 tablet (10 mg total) by mouth daily as needed for allergies. 30 tablet 3  . nicotine polacrilex (NICOTINE MINI) 2 MG lozenge Take 1 lozenge (2 mg total) by mouth as needed for smoking cessation. 100 tablet 1  . albuterol (PROVENTIL HFA;VENTOLIN HFA) 108 (90 Base) MCG/ACT inhaler Inhale 2 puffs into the lungs every 6 (six) hours as needed for wheezing. 1 each 1  . amLODipine (NORVASC) 10 MG tablet Take 1 tablet (10 mg total) by mouth daily. 90 tablet 3  . hydrOXYzine (VISTARIL) 25 MG capsule TAKE 1 TABLET BY MOUTH 2 (TWO) TIMES DAILY AS NEEDED. MUST MAKE APPT FOR FURTHER REFILLS 60 capsule 0  .  triamcinolone cream (KENALOG) 0.1 % Apply 1 application topically 2 (two) times daily. (Patient not taking: Reported on 03/08/2018) 30 g 1   No facility-administered medications prior to visit.      ROS Review of Systems  Constitutional: Negative for activity change, appetite change and fatigue.  HENT: Negative for congestion, sinus pressure and sore throat.   Eyes: Negative for visual disturbance.  Respiratory: Negative for cough, chest tightness, shortness of breath and wheezing.   Cardiovascular: Negative for chest pain and palpitations.  Gastrointestinal: Negative for abdominal distention, abdominal pain and constipation.  Endocrine: Negative for polydipsia.  Genitourinary: Negative for dysuria and frequency.  Musculoskeletal:       See hpi  Skin: Negative for rash.  Neurological: Negative for tremors, light-headedness and numbness.  Hematological: Does not bruise/bleed easily.  Psychiatric/Behavioral: Negative for agitation and behavioral problems.    Objective:  BP 129/86   Pulse 90   Temp 98.2 F (36.8 C) (Oral)   Ht 5\' 7"  (1.702 m)   Wt 230 lb 3.2 oz (104.4 kg)   SpO2 96%   BMI 36.05 kg/m   BP/Weight 10/11/2018 03/08/2018 8/84/1660  Systolic BP 630 160 109  Diastolic BP 86 85 53  Wt. (Lbs) 230.2 232 -  BMI 36.05 36.34 -      Physical Exam Constitutional:      Appearance: She is well-developed.  Cardiovascular:     Rate and Rhythm: Normal rate.     Heart sounds: Normal heart sounds. No murmur.  Pulmonary:     Effort: Pulmonary effort is normal.     Breath sounds: Normal breath sounds. No wheezing or rales.  Chest:     Chest wall: No tenderness.  Abdominal:     General: Bowel sounds are normal. There is no distension.     Palpations: Abdomen is soft. There is no mass.     Tenderness: There is no abdominal tenderness.  Musculoskeletal: Normal range of motion.  Neurological:     Mental Status: She is alert and oriented to person, place, and time.     CMP  Latest Ref Rng & Units 11/14/2015 01/09/2015 09/28/2014  Glucose 65 - 99 mg/dL 92(J) 66 82  BUN 7 - 25 mg/dL 23 13 <1(H)  Creatinine 0.50 - 1.05 mg/dL 4.17 4.08 1.44  Sodium 135 - 146 mmol/L 138 145 141  Potassium 3.5 - 5.3 mmol/L 3.7 5.2 3.3(L)  Chloride 98 - 110 mmol/L 101 106 110  CO2 20 - 31 mmol/L 27 24 23   Calcium 8.6 - 10.4 mg/dL 9.9 81.8 5.6(D)  Total Protein 6.1 - 8.1 g/dL 7.8 7.5 -  Total Bilirubin 0.2 - 1.2 mg/dL 0.5 0.3 -  Alkaline Phos 33 - 130 U/L 65 55 -  AST 10 - 35 U/L 17 19 -  ALT 6 - 29 U/L 10 10 -    Lipid Panel     Component Value Date/Time   CHOL 171 11/14/2015 1030   TRIG 79 11/14/2015 1030   HDL 67 11/14/2015 1030   CHOLHDL 2.6 11/14/2015 1030   VLDL 16 11/14/2015 1030   LDLCALC 88 11/14/2015 1030    CBC    Component Value Date/Time   WBC 7.7 11/14/2015 1030   RBC 4.93 11/14/2015 1030   HGB 14.2 11/14/2015 1030   HCT 42.7 11/14/2015 1030   PLT 326 11/14/2015 1030   MCV 86.6 11/14/2015 1030   MCH 28.8 11/14/2015 1030   MCHC 33.3 11/14/2015 1030   RDW 14.8 11/14/2015 1030   LYMPHSABS 2,695 11/14/2015 1030   MONOABS 693 11/14/2015 1030   EOSABS 693 (H) 11/14/2015 1030   BASOSABS 0 11/14/2015 1030    Lab Results  Component Value Date   HGBA1C 5.0 06/17/2016    Assessment & Plan:   1. Essential hypertension Controlled Counseled on blood pressure goal of less than 130/80, low-sodium, DASH diet, medication compliance, 150 minutes of moderate intensity exercise per week. Discussed medication compliance, adverse effects. - amLODipine (NORVASC) 10 MG tablet; Take 1 tablet (10 mg total) by mouth daily.  Dispense: 90 tablet; Refill: 1 - Basic Metabolic Panel  2. Intrinsic atopic dermatitis Uses hydroxyzine as needed for itching - hydrOXYzine (VISTARIL) 25 MG capsule; TAKE 1 TABLET BY MOUTH 2 (TWO) TIMES DAILY AS NEEDED.  Dispense: 180 capsule; Refill: 1  3. Mild intermittent asthma without complication Stable - albuterol (VENTOLIN HFA) 108  (90 Base) MCG/ACT inhaler; Inhale 2 puffs into the lungs every 6 (six) hours as needed for wheezing.  Dispense: 18 g; Refill: 1   Meds ordered this encounter  Medications  . amLODipine (NORVASC) 10 MG tablet    Sig: Take 1 tablet (10 mg total) by mouth daily.    Dispense:  90 tablet    Refill:  1  . hydrOXYzine (VISTARIL) 25 MG capsule    Sig: TAKE 1 TABLET BY MOUTH 2 (TWO) TIMES DAILY AS NEEDED.  Dispense:  180 capsule    Refill:  1  . albuterol (VENTOLIN HFA) 108 (90 Base) MCG/ACT inhaler    Sig: Inhale 2 puffs into the lungs every 6 (six) hours as needed for wheezing.    Dispense:  18 g    Refill:  1    Follow-up: Return in about 6 months (around 04/10/2019) for PCP Dr Laural BenesJohnson - medical conditions.       Hoy RegisterEnobong Padme Arriaga, MD, FAAFP. [facility] [address] [place], [place] [phone]   10/11/2018, 5:37 PM

## 2018-10-12 LAB — BASIC METABOLIC PANEL
BUN/Creatinine Ratio: 6 — ABNORMAL LOW (ref 12–28)
BUN: 4 mg/dL — ABNORMAL LOW (ref 8–27)
CO2: 22 mmol/L (ref 20–29)
Calcium: 9.6 mg/dL (ref 8.7–10.3)
Chloride: 101 mmol/L (ref 96–106)
Creatinine, Ser: 0.63 mg/dL (ref 0.57–1.00)
GFR calc Af Amer: 113 mL/min/{1.73_m2} (ref >59)
GFR calc non Af Amer: 98 mL/min/{1.73_m2} (ref >59)
Glucose: 77 mg/dL (ref 65–99)
Potassium: 4.6 mmol/L (ref 3.5–5.2)
Sodium: 140 mmol/L (ref 134–144)

## 2018-10-14 ENCOUNTER — Telehealth: Payer: Self-pay

## 2018-10-14 NOTE — Telephone Encounter (Signed)
Patient was called and a voicemail was left informing patient to return phone call for lab results. 

## 2018-10-14 NOTE — Telephone Encounter (Signed)
-----   Message from Charlott Rakes, MD sent at 10/13/2018 12:22 PM EDT ----- Please inform the patient that labs are normal. Thank you.

## 2018-10-17 NOTE — Telephone Encounter (Signed)
New Message ° ° °Pt returning call about labs °

## 2018-10-19 NOTE — Telephone Encounter (Signed)
Patient was called and informed of lab results. 

## 2018-12-02 ENCOUNTER — Other Ambulatory Visit: Payer: Self-pay

## 2018-12-02 ENCOUNTER — Emergency Department (HOSPITAL_COMMUNITY)
Admission: EM | Admit: 2018-12-02 | Discharge: 2018-12-02 | Disposition: A | Discharge status: Home / Self Care | Payer: Self-pay | Attending: Emergency Medicine | Admitting: Emergency Medicine

## 2018-12-02 ENCOUNTER — Emergency Department (HOSPITAL_COMMUNITY): Payer: Self-pay

## 2018-12-02 ENCOUNTER — Encounter (HOSPITAL_COMMUNITY): Payer: Self-pay | Admitting: Emergency Medicine

## 2018-12-02 DIAGNOSIS — R519 Headache, unspecified: Secondary | ICD-10-CM | POA: Insufficient documentation

## 2018-12-02 DIAGNOSIS — F17210 Nicotine dependence, cigarettes, uncomplicated: Secondary | ICD-10-CM | POA: Insufficient documentation

## 2018-12-02 DIAGNOSIS — J45909 Unspecified asthma, uncomplicated: Secondary | ICD-10-CM | POA: Insufficient documentation

## 2018-12-02 DIAGNOSIS — R05 Cough: Secondary | ICD-10-CM | POA: Insufficient documentation

## 2018-12-02 DIAGNOSIS — Z79899 Other long term (current) drug therapy: Secondary | ICD-10-CM | POA: Insufficient documentation

## 2018-12-02 DIAGNOSIS — R1111 Vomiting without nausea: Secondary | ICD-10-CM

## 2018-12-02 DIAGNOSIS — I10 Essential (primary) hypertension: Secondary | ICD-10-CM | POA: Insufficient documentation

## 2018-12-02 LAB — CBC WITH DIFFERENTIAL/PLATELET
Abs Immature Granulocytes: 0.04 10*3/uL (ref 0.00–0.07)
Basophils Absolute: 0 10*3/uL (ref 0.0–0.1)
Basophils Relative: 0 %
Eosinophils Absolute: 0.1 10*3/uL (ref 0.0–0.5)
Eosinophils Relative: 1 %
HCT: 42.5 % (ref 36.0–46.0)
Hemoglobin: 14.3 g/dL (ref 12.0–15.0)
Immature Granulocytes: 1 %
Lymphocytes Relative: 22 %
Lymphs Abs: 1.7 10*3/uL (ref 0.7–4.0)
MCH: 29.9 pg (ref 26.0–34.0)
MCHC: 33.6 g/dL (ref 30.0–36.0)
MCV: 88.7 fL (ref 80.0–100.0)
Monocytes Absolute: 0.9 10*3/uL (ref 0.1–1.0)
Monocytes Relative: 12 %
Neutro Abs: 5.1 10*3/uL (ref 1.7–7.7)
Neutrophils Relative %: 64 %
Platelets: 253 10*3/uL (ref 150–400)
RBC: 4.79 MIL/uL (ref 3.87–5.11)
RDW: 14 % (ref 11.5–15.5)
WBC: 7.8 10*3/uL (ref 4.0–10.5)
nRBC: 0 % (ref 0.0–0.2)

## 2018-12-02 LAB — COMPREHENSIVE METABOLIC PANEL
ALT: 33 U/L (ref 0–44)
AST: 32 U/L (ref 15–41)
Albumin: 3.8 g/dL (ref 3.5–5.0)
Alkaline Phosphatase: 36 U/L — ABNORMAL LOW (ref 38–126)
Anion gap: 14 (ref 5–15)
BUN: 12 mg/dL (ref 8–23)
CO2: 23 mmol/L (ref 22–32)
Calcium: 9.8 mg/dL (ref 8.9–10.3)
Chloride: 96 mmol/L — ABNORMAL LOW (ref 98–111)
Creatinine, Ser: 0.85 mg/dL (ref 0.44–1.00)
GFR calc Af Amer: >60 mL/min (ref >60)
GFR calc non Af Amer: >60 mL/min (ref >60)
Glucose, Bld: 84 mg/dL (ref 70–99)
Potassium: 4.1 mmol/L (ref 3.5–5.1)
Sodium: 133 mmol/L — ABNORMAL LOW (ref 135–145)
Total Bilirubin: 1.1 mg/dL (ref 0.3–1.2)
Total Protein: 7.4 g/dL (ref 6.5–8.1)

## 2018-12-02 LAB — LIPASE, BLOOD: Lipase: 32 U/L (ref 11–51)

## 2018-12-02 MED ORDER — ONDANSETRON HCL 4 MG/2ML IJ SOLN
4.0000 mg | Freq: Once | INTRAMUSCULAR | Status: AC
  Administered 2018-12-02: 4 mg via INTRAVENOUS

## 2018-12-02 MED ORDER — ACETAMINOPHEN 325 MG PO TABS
650.0000 mg | ORAL_TABLET | Freq: Once | ORAL | Status: AC
  Administered 2018-12-02: 650 mg via ORAL
  Filled 2018-12-02: qty 2

## 2018-12-02 NOTE — ED Triage Notes (Signed)
Pt in w/congestion, n/v and productive cough x 3 days. Denies any fevers, states hx of asthma and always has some sob but no worse. Denies any abdominal pain or recent sick contacts

## 2018-12-02 NOTE — ED Notes (Signed)
Pt to xray

## 2018-12-02 NOTE — ED Notes (Signed)
Patient verbalizes understanding of discharge instructions. Opportunity for questioning and answers were provided. Armband removed by staff, pt discharged from ED ambulatory with personal cane.

## 2018-12-02 NOTE — Discharge Instructions (Signed)
Today you were evaluated at the emergency department for a headache.  1. Medications: continue usual home medications 2. Treatment: rest, drink plenty of fluids, if headache take Tylenol for pain.  Please take as active on the bottle.  3. Follow Up: Please followup with your primary doctor in 3 days and neurology within 1 week for discussion of your diagnoses and further evaluation after today's visit;   Please return to the ER for double vision, speech difficulty, gait disturbance, persistent vomiting or other concerns.  Headache:  You are having a headache. No specific cause was found today for your headache. It may have been a migraine or other cause of headache. Stress, anxiety, fatigue, and depression are common triggers for headaches. Your headache today does not appear to be life-threatening or require hospitalization, but often the exact cause of headaches is not determined in the emergency department. Therefore, followup with your doctor is very important to find out what may have caused your headache, and whether or not you need any further diagnostic testing or treatment. Sometimes headaches can appear benign but then more serious symptoms can develop which should prompt an immediate reevaluation by your doctor or the emergency department.  Hydration: Have a goal of about a half liter of water every couple hours to stay well hydrated.   Sleep: Please be sure to get plenty of sleep with a goal of 8 hours per night. Having a regular bed time and bedtime routine can help with this.  Screens: Reduce the amount of time you are in front of screens.  Take about a 5-10-minute break every hour or every couple hours to give your eyes rest.  Do not use screens in dark rooms.  Glasses with a blue light filter may also help reduce eye fatigue.  Stress: Take steps to reduce stress as much as possible.   Seek immediate medical attention if:  You develop possible problems with medications  prescribed. The medications don't resolve your headache, if it recurs, or if you have multiple episodes of vomiting or can't take fluids by mouth You have a change from the usual headache. If you developed a sudden severe headache or confusion, become poorly responsive or faint, developed a fever above 100.4 or problems breathing, have a change in speech, vision, swallowing or understanding, or developed new weakness, numbness, tingling, incoordination or have a seizure.

## 2018-12-02 NOTE — ED Provider Notes (Signed)
[facility] Provider Note   CSN: [id number] Arrival date & time: 12/02/18  1512     History   Chief Complaint Chief Complaint  Patient presents with  . Emesis  . Headache    HPI Marie Woods is a 63 y.o. female has medical history significant for asthma, eczema, hypertension presents to emergency department today with chief complaint of headache and cough.  States her symptoms have been going on for 3 days.  She states the headache is located on the left side of her head.  It feels like her usual headaches however it is lasting longer than they typically do.  She has tried taking Goody powder without symptom relief.  She states the headache has progressively worsened since onset.  She rates the pain 7 out of 10 in severity. She also reporting a productive cough.  She states her cough has white sputum.  She has a history of asthma and states she typically coughs with it.  Cough is not always productive however.  Patient also reports 2 episodes of nonbloody nonbilious emesis today.  She denies any associated abdominal pain or nausea.  She denies any sick contacts.  Denies any contact with someone positive for COVID-19.   Denies fever, chills, chest pain, shortness of breath, wheezing, congestion, urinary symptoms, diarrhea.   Past Medical History:  Diagnosis Date  . Asthma   . Eczema   . Hypertension     Patient Active Problem List   Diagnosis Date Noted  . Chronic pain of left knee 04/12/2017  . Chronic pain of right knee 04/12/2017  . ASCUS with positive high risk HPV cervical 05/23/2015  . Healthcare maintenance 03/28/2015  . Alopecia 06/13/2014  . DEPRESSION 04/03/2010  . TOBACCO ABUSE 06/28/2009  . ALLERGIC RHINITIS 06/28/2009  . History of alcohol use 01/10/2009  . Essential hypertension 01/10/2009  . Asthma 01/10/2009  . Dermatitis, atopic 01/10/2009    History reviewed. No pertinent surgical history.   OB History   No  obstetric history on file.      Home Medications    Prior to Admission medications   Medication Sig Start Date End Date Taking? Authorizing Provider  albuterol (VENTOLIN HFA) 108 (90 Base) MCG/ACT inhaler Inhale 2 puffs into the lungs every 6 (six) hours as needed for wheezing. 10/11/18   Hoy RegisterNewlin, Enobong, MD  amLODipine (NORVASC) 10 MG tablet Take 1 tablet (10 mg total) by mouth daily. 10/11/18   Hoy RegisterNewlin, Enobong, MD  Aspirin-Salicylamide-Caffeine (BC HEADACHE POWDER PO) Take 1 each by mouth every 8 (eight) hours as needed (pain). Reported on 05/23/2015    [provider]  clobetasol ointment (TEMOVATE) 0.05 % Apply 1 application topically 2 (two) times daily. 05/31/18   Marcine MatarJohnson, Deborah B, MD  hydrOXYzine (VISTARIL) 25 MG capsule TAKE 1 TABLET BY MOUTH 2 (TWO) TIMES DAILY AS NEEDED. 10/11/18   Hoy RegisterNewlin, Enobong, MD  loratadine (CLARITIN) 10 MG tablet Take 1 tablet (10 mg total) by mouth daily as needed for allergies. 03/18/17   Anders SimmondsMcClung, Angela M, PA-C  nicotine polacrilex (NICOTINE MINI) 2 MG lozenge Take 1 lozenge (2 mg total) by mouth as needed for smoking cessation. 03/08/18   Storm FriskWright, Patrick E, MD  triamcinolone cream (KENALOG) 0.1 % Apply 1 application topically 2 (two) times daily. Patient not taking: Reported on 03/08/2018 03/08/18   Storm FriskWright, Patrick E, MD    Family History Family History  Problem Relation Age of Onset  . Cancer Mother   . Heart  disease Mother   . Heart disease Father     Social History Social History   Tobacco Use  . Smoking status: Current Every Day Smoker    Packs/day: 0.25    Types: Cigarettes  . Smokeless tobacco: Current User  Substance Use Topics  . Alcohol use: Yes    Comment: 1 beer daily after work  . Drug use: No     Allergies   Patient has no known allergies.   Review of Systems Review of Systems  Constitutional: Negative for chills and fever.  HENT: Negative for congestion, ear pain, facial swelling, rhinorrhea, sinus pressure and sinus  pain.   Respiratory: Positive for cough. Negative for chest tightness, shortness of breath and wheezing.   Cardiovascular: Negative for chest pain.  Gastrointestinal: Positive for vomiting. Negative for abdominal pain, diarrhea and nausea.  Genitourinary: Negative for difficulty urinating, dysuria, flank pain, frequency and hematuria.  Musculoskeletal: Negative for back pain and neck pain.  Skin: Negative for rash and wound.  Neurological: Positive for headaches.     Physical Exam Updated Vital Signs BP 128/74   Pulse 85   Temp 98.2 F (36.8 C) (Oral)   Resp 16   Wt 104.4 kg   SpO2 99%   BMI 36.05 kg/m   Physical Exam Vitals signs and nursing note reviewed.  Constitutional:      General: She is not in acute distress.    Appearance: She is not ill-appearing.  HENT:     Head: Normocephalic and atraumatic.     Comments: No tenderness to palpation of skull. No deformities or crepitus noted. No open wounds, abrasions or lacerations. No sinus or temporal tenderness.     Right Ear: Tympanic membrane and external ear normal.     Left Ear: Tympanic membrane and external ear normal.     Nose: Nose normal.     Mouth/Throat:     Mouth: Mucous membranes are moist.     Pharynx: Oropharynx is clear.  Eyes:     General: No scleral icterus.       Right eye: No discharge.        Left eye: No discharge.     Extraocular Movements: Extraocular movements intact.     Conjunctiva/sclera: Conjunctivae normal.     Pupils: Pupils are equal, round, and reactive to light.  Neck:     Musculoskeletal: Normal range of motion.     Vascular: No JVD.  Cardiovascular:     Rate and Rhythm: Normal rate and regular rhythm.     Pulses: Normal pulses.          Radial pulses are 2+ on the right side and 2+ on the left side.     Heart sounds: Normal heart sounds.  Pulmonary:     Comments: Lungs clear to auscultation in all fields. Symmetric chest rise. No wheezing, rales, or rhonchi. Chest:     Chest  wall: No tenderness.  Abdominal:     Tenderness: There is no right CVA tenderness or left CVA tenderness.     Comments: Abdomen is soft, non-distended, and non-tender in all quadrants. No rigidity, no guarding. No peritoneal signs.  Musculoskeletal: Normal range of motion.  Skin:    General: Skin is warm and dry.     Capillary Refill: Capillary refill takes less than 2 seconds.  Neurological:     Mental Status: She is oriented to person, place, and time.     GCS: GCS eye subscore is 4. GCS verbal subscore  is 5. GCS motor subscore is 6.     Comments: Fluent speech, no facial droop. Speech is clear and goal oriented, follows commands CN III-XII intact, no facial droop Normal strength in upper and lower extremities bilaterally including dorsiflexion and plantar flexion, strong and equal grip strength Sensation normal to light and sharp touch Moves extremities without ataxia, coordination intact Normal finger to nose and rapid alternating movements Normal gait and balance  Psychiatric:        Behavior: Behavior normal.      ED Treatments / Results  Labs (all labs ordered are listed, but only abnormal results are displayed) Labs Reviewed  COMPREHENSIVE METABOLIC PANEL - Abnormal; Notable for the following components:      Result Value   Sodium 133 (*)    Chloride 96 (*)    Alkaline Phosphatase 36 (*)    All other components within normal limits  LIPASE, BLOOD  CBC WITH DIFFERENTIAL/PLATELET    EKG None  Radiology Dg Chest 2 View  Result Date: 12/02/2018 CLINICAL DATA:  Pt in w/congestion, n/v and productive cough x 3 days. Denies any fevers, states hx of asthma and always has some sob but no worse. Denies any abdominal pain or recent sick contacts EXAM: CHEST - 2 VIEW COMPARISON:  11/17/2008 and older exams. FINDINGS: Cardiac silhouette is normal in size and configuration. No mediastinal or hilar masses. No evidence of adenopathy. Lungs are hyperexpanded, but clear. No  pleural effusion or pneumothorax. Skeletal structures are intact. IMPRESSION: No acute cardiopulmonary disease. Electronically Signed   By: Amie Portland M.D.   On: 12/02/2018 18:33   Ct Head Wo Contrast  Result Date: 12/02/2018 CLINICAL DATA:  Headache for 2 days, worsening today. EXAM: CT HEAD WITHOUT CONTRAST TECHNIQUE: Contiguous axial images were obtained from the base of the skull through the vertex without intravenous contrast. COMPARISON:  02/21/2007 FINDINGS: Brain: No evidence of acute infarction, hemorrhage, hydrocephalus, extra-axial collection or mass lesion/mass effect. Mild ventricular sulcal enlargement is noted consistent with mild atrophy. Vascular: No hyperdense vessel or unexpected calcification. Skull: Normal. Negative for fracture or focal lesion. Sinuses/Orbits: Visualized globes and orbits are unremarkable. The visualized sinuses and mastoid air cells are clear. Other: None. IMPRESSION: 1. No acute intracranial abnormalities. 2. Mild atrophy. Electronically Signed   By: Amie Portland M.D.   On: 12/02/2018 18:56    Procedures Procedures (including critical care time)  Medications Ordered in ED Medications  ondansetron (ZOFRAN) injection 4 mg (4 mg Intravenous Given 12/02/18 1918)  acetaminophen (TYLENOL) tablet 650 mg (650 mg Oral Given 12/02/18 2011)     Initial Impression / Assessment and Plan / ED Course  I have reviewed the triage vital signs and the nursing notes.  Pertinent labs & imaging results that were available during my care of the patient were reviewed by me and considered in my medical decision making (see chart for details).   Patient seen and examined.  She is well-appearing, no acute distress.  She is afebrile with vitals within normal range.  Her lungs are clear to auscultation in all fields.  No wheezing heard.  No shortness of breath or difficulty breathing on exam.  No abdominal tenderness, no peritoneal signs. Pt HA treated with tylenol and  improved while in ED.  Presentation is like pts typical HA and non concerning for [facility], ICH, Meningitis, or temporal arteritis. Pt is afebrile with no focal neuro deficits, nuchal rigidity, or change in vision.  CT head ordered as this feels slightly different  than her usual headache given the intensity and duration. It is is negative for acute abnormality.  X-ray viewed by me without infiltrate.  Patient is tolerating p.o. intake while in the emergency department.   The patient appears reasonably screened and/or stabilized for discharge and I doubt any other medical condition or other [facility] requiring further screening, evaluation, or treatment in the ED at this time prior to discharge. The patient is safe for discharge with strict return precautions discussed. Recommend pcp follow up.   Portions of this note were generated with [employer]. Dictation errors may occur despite best attempts at proofreading.  Final Clinical Impressions(s) / ED Diagnoses   Final diagnoses:  Acute nonintractable headache, unspecified headache type  Non-intractable vomiting without nausea, unspecified vomiting type    ED Discharge Orders    None       Cherre Robins, PA-C 12/02/18 2312    Wyvonnia Dusky, MD 12/03/18 [phone]

## 2018-12-13 ENCOUNTER — Encounter (HOSPITAL_COMMUNITY): Payer: Self-pay | Admitting: Emergency Medicine

## 2018-12-13 ENCOUNTER — Emergency Department (HOSPITAL_COMMUNITY): Payer: Medicaid Other

## 2018-12-13 ENCOUNTER — Inpatient Hospital Stay (HOSPITAL_COMMUNITY)
Admission: EM | Admit: 2018-12-13 | Discharge: 2019-05-19 | DRG: 871 | Disposition: A | Discharge status: Nursing Home (Includes ICF) | Payer: Medicaid Other | Attending: Internal Medicine | Admitting: Internal Medicine

## 2018-12-13 DIAGNOSIS — F10139 Alcohol abuse with withdrawal, unspecified: Secondary | ICD-10-CM | POA: Diagnosis present

## 2018-12-13 DIAGNOSIS — R0602 Shortness of breath: Secondary | ICD-10-CM

## 2018-12-13 DIAGNOSIS — J45909 Unspecified asthma, uncomplicated: Secondary | ICD-10-CM | POA: Diagnosis present

## 2018-12-13 DIAGNOSIS — R6520 Severe sepsis without septic shock: Secondary | ICD-10-CM | POA: Diagnosis present

## 2018-12-13 DIAGNOSIS — J4520 Mild intermittent asthma, uncomplicated: Secondary | ICD-10-CM

## 2018-12-13 DIAGNOSIS — A419 Sepsis, unspecified organism: Principal | ICD-10-CM | POA: Diagnosis present

## 2018-12-13 DIAGNOSIS — R5381 Other malaise: Secondary | ICD-10-CM | POA: Diagnosis not present

## 2018-12-13 DIAGNOSIS — G4700 Insomnia, unspecified: Secondary | ICD-10-CM | POA: Diagnosis not present

## 2018-12-13 DIAGNOSIS — R451 Restlessness and agitation: Secondary | ICD-10-CM | POA: Diagnosis not present

## 2018-12-13 DIAGNOSIS — F10129 Alcohol abuse with intoxication, unspecified: Secondary | ICD-10-CM | POA: Diagnosis present

## 2018-12-13 DIAGNOSIS — Z0189 Encounter for other specified special examinations: Secondary | ICD-10-CM

## 2018-12-13 DIAGNOSIS — F17210 Nicotine dependence, cigarettes, uncomplicated: Secondary | ICD-10-CM | POA: Diagnosis present

## 2018-12-13 DIAGNOSIS — Z8249 Family history of ischemic heart disease and other diseases of the circulatory system: Secondary | ICD-10-CM

## 2018-12-13 DIAGNOSIS — N390 Urinary tract infection, site not specified: Secondary | ICD-10-CM | POA: Diagnosis present

## 2018-12-13 DIAGNOSIS — Z751 Person awaiting admission to adequate facility elsewhere: Secondary | ICD-10-CM

## 2018-12-13 DIAGNOSIS — I10 Essential (primary) hypertension: Secondary | ICD-10-CM | POA: Diagnosis present

## 2018-12-13 DIAGNOSIS — D649 Anemia, unspecified: Secondary | ICD-10-CM | POA: Diagnosis not present

## 2018-12-13 DIAGNOSIS — Z91018 Allergy to other foods: Secondary | ICD-10-CM | POA: Diagnosis not present

## 2018-12-13 DIAGNOSIS — E876 Hypokalemia: Secondary | ICD-10-CM | POA: Diagnosis not present

## 2018-12-13 DIAGNOSIS — L2084 Intrinsic (allergic) eczema: Secondary | ICD-10-CM

## 2018-12-13 DIAGNOSIS — Z87898 Personal history of other specified conditions: Secondary | ICD-10-CM

## 2018-12-13 DIAGNOSIS — Y900 Blood alcohol level of less than 20 mg/100 ml: Secondary | ICD-10-CM | POA: Diagnosis present

## 2018-12-13 DIAGNOSIS — G9341 Metabolic encephalopathy: Secondary | ICD-10-CM

## 2018-12-13 DIAGNOSIS — R739 Hyperglycemia, unspecified: Secondary | ICD-10-CM | POA: Diagnosis not present

## 2018-12-13 DIAGNOSIS — L853 Xerosis cutis: Secondary | ICD-10-CM | POA: Diagnosis present

## 2018-12-13 DIAGNOSIS — T7840XA Allergy, unspecified, initial encounter: Secondary | ICD-10-CM | POA: Diagnosis not present

## 2018-12-13 DIAGNOSIS — B369 Superficial mycosis, unspecified: Secondary | ICD-10-CM | POA: Diagnosis present

## 2018-12-13 DIAGNOSIS — F329 Major depressive disorder, single episode, unspecified: Secondary | ICD-10-CM | POA: Diagnosis present

## 2018-12-13 DIAGNOSIS — E512 Wernicke's encephalopathy: Secondary | ICD-10-CM | POA: Diagnosis present

## 2018-12-13 DIAGNOSIS — Z20822 Contact with and (suspected) exposure to covid-19: Secondary | ICD-10-CM | POA: Diagnosis present

## 2018-12-13 DIAGNOSIS — F17200 Nicotine dependence, unspecified, uncomplicated: Secondary | ICD-10-CM

## 2018-12-13 DIAGNOSIS — R410 Disorientation, unspecified: Secondary | ICD-10-CM

## 2018-12-13 DIAGNOSIS — G44209 Tension-type headache, unspecified, not intractable: Secondary | ICD-10-CM | POA: Diagnosis present

## 2018-12-13 DIAGNOSIS — Z1389 Encounter for screening for other disorder: Secondary | ICD-10-CM

## 2018-12-13 DIAGNOSIS — F1026 Alcohol dependence with alcohol-induced persisting amnestic disorder: Secondary | ICD-10-CM | POA: Diagnosis present

## 2018-12-13 DIAGNOSIS — E669 Obesity, unspecified: Secondary | ICD-10-CM | POA: Diagnosis present

## 2018-12-13 DIAGNOSIS — K5900 Constipation, unspecified: Secondary | ICD-10-CM | POA: Diagnosis not present

## 2018-12-13 DIAGNOSIS — R0789 Other chest pain: Secondary | ICD-10-CM | POA: Diagnosis present

## 2018-12-13 DIAGNOSIS — L309 Dermatitis, unspecified: Secondary | ICD-10-CM | POA: Diagnosis not present

## 2018-12-13 DIAGNOSIS — Z79899 Other long term (current) drug therapy: Secondary | ICD-10-CM | POA: Diagnosis not present

## 2018-12-13 DIAGNOSIS — R4182 Altered mental status, unspecified: Secondary | ICD-10-CM

## 2018-12-13 DIAGNOSIS — Z6833 Body mass index (BMI) 33.0-33.9, adult: Secondary | ICD-10-CM

## 2018-12-13 LAB — URINALYSIS, ROUTINE W REFLEX MICROSCOPIC
Glucose, UA: NEGATIVE mg/dL
Ketones, ur: 5 mg/dL — AB
Nitrite: NEGATIVE
Protein, ur: 30 mg/dL — AB
Specific Gravity, Urine: 1.027 (ref 1.005–1.030)
pH: 5 (ref 5.0–8.0)

## 2018-12-13 LAB — CBC WITH DIFFERENTIAL/PLATELET
Abs Immature Granulocytes: 0.15 10*3/uL — ABNORMAL HIGH (ref 0.00–0.07)
Basophils Absolute: 0 10*3/uL (ref 0.0–0.1)
Basophils Relative: 0 %
Eosinophils Absolute: 0 10*3/uL (ref 0.0–0.5)
Eosinophils Relative: 0 %
HCT: 40.4 % (ref 36.0–46.0)
Hemoglobin: 13.8 g/dL (ref 12.0–15.0)
Immature Granulocytes: 2 %
Lymphocytes Relative: 17 %
Lymphs Abs: 1.7 10*3/uL (ref 0.7–4.0)
MCH: 30 pg (ref 26.0–34.0)
MCHC: 34.2 g/dL (ref 30.0–36.0)
MCV: 87.8 fL (ref 80.0–100.0)
Monocytes Absolute: 1.1 10*3/uL — ABNORMAL HIGH (ref 0.1–1.0)
Monocytes Relative: 11 %
Neutro Abs: 6.9 10*3/uL (ref 1.7–7.7)
Neutrophils Relative %: 70 %
Platelets: 240 10*3/uL (ref 150–400)
RBC: 4.6 MIL/uL (ref 3.87–5.11)
RDW: 13.7 % (ref 11.5–15.5)
WBC: 9.8 10*3/uL (ref 4.0–10.5)
nRBC: 0.6 % — ABNORMAL HIGH (ref 0.0–0.2)

## 2018-12-13 LAB — URINALYSIS, COMPLETE (UACMP) WITH MICROSCOPIC

## 2018-12-13 LAB — COMPREHENSIVE METABOLIC PANEL
ALT: 42 U/L (ref 0–44)
AST: 45 U/L — ABNORMAL HIGH (ref 15–41)
Albumin: 4 g/dL (ref 3.5–5.0)
Alkaline Phosphatase: 37 U/L — ABNORMAL LOW (ref 38–126)
Anion gap: 17 — ABNORMAL HIGH (ref 5–15)
BUN: 19 mg/dL (ref 8–23)
CO2: 19 mmol/L — ABNORMAL LOW (ref 22–32)
Calcium: 9.9 mg/dL (ref 8.9–10.3)
Chloride: 102 mmol/L (ref 98–111)
Creatinine, Ser: 1.09 mg/dL — ABNORMAL HIGH (ref 0.44–1.00)
GFR calc Af Amer: >60 mL/min (ref >60)
GFR calc non Af Amer: 55 mL/min — ABNORMAL LOW (ref >60)
Glucose, Bld: 116 mg/dL — ABNORMAL HIGH (ref 70–99)
Potassium: 3.8 mmol/L (ref 3.5–5.1)
Sodium: 138 mmol/L (ref 135–145)
Total Bilirubin: 1.3 mg/dL — ABNORMAL HIGH (ref 0.3–1.2)
Total Protein: 7.9 g/dL (ref 6.5–8.1)

## 2018-12-13 LAB — LACTIC ACID, PLASMA: Lactic Acid, Venous: 2.5 mmol/L (ref 0.5–1.9)

## 2018-12-13 LAB — ETHANOL: Alcohol, Ethyl (B): <10 mg/dL (ref <10)

## 2018-12-13 LAB — CBG MONITORING, ED: Glucose-Capillary: 95 mg/dL (ref 70–99)

## 2018-12-13 LAB — AMMONIA: Ammonia: 29 umol/L (ref 9–35)

## 2018-12-13 MED ORDER — DILTIAZEM HCL 25 MG/5ML IV SOLN: 10.0000 mg | Freq: Once | INTRAVENOUS | Status: DC

## 2018-12-13 NOTE — ED Notes (Addendum)
PT found with bed bug on pant leg, EVS notified to confirm. PT confused asking for cell phone, wallet, keys. PT had none of these present when brought into room. [place] RN witness.

## 2018-12-13 NOTE — ED Notes (Signed)
Sent a urine culture with the urine specimen 

## 2018-12-13 NOTE — H&P (Signed)
History and Physical   [name] WCB:[id number] DOB: [birth year] DOA: 12/13/2018  Referring MD/NP/PA: Dr. Francia Greaves  PCP: Ladell Pier, MD   Outpatient Specialists: None  Patient coming from: Home  Chief Complaint: Altered mental status  HPI: Marie Woods is a 63 y.o. female with medical history significant of asthma, hypertension, alcohol abuse who was brought in by EMS after neighbors called in stating that patient is confused and altered.  She is currently disoriented and not able to give coherent history.  Most history therefore was obtained from the neighbors who assisted EMS.  Has been sick gradually over a month.  Apparently.  She has however been coherent able to communicate without any known problems.  Today however she is not herself.  She told me she thinks she is somewhere in the house not in the hospital.  She is not tremulous and she is not aggressive.  Her alcohol level was negative.  No reported drug use.  Patient is being admitted with altered mental status for work-up.  So far no UTI or pneumonia identified..  ED Course: Temperature is 98 blood pressure 138/108 pulse 106 respirate of 26 oxygen sat 96% room air.  CBC and chemistry relatively normal except for CO2 of 19 and creatinine 1.09.  Urinalysis showed WBC 11-20 with rare bacteria.  Leukocytes with trace with some ketones.  Lactic acid 2.5, urine drug screen is currently pending.  Ammonia is 29.  Head CT without contrast is negative.  Patient is being admitted for further work-up.  Review of Systems: As per HPI otherwise 10 point review of systems negative.    Past Medical History:  Diagnosis Date  . Asthma   . Eczema   . Hypertension     History reviewed. No pertinent surgical history.   reports that she has been smoking cigarettes. She has been smoking about 0.25 packs per day. She uses smokeless tobacco. She reports current alcohol use. She reports that she does not use drugs.  No Known Allergies   Family History  Problem Relation Age of Onset  . Cancer Mother   . Heart disease Mother   . Heart disease Father      Prior to Admission medications   Medication Sig Start Date End Date Taking? Authorizing Provider  albuterol (VENTOLIN HFA) 108 (90 Base) MCG/ACT inhaler Inhale 2 puffs into the lungs every 6 (six) hours as needed for wheezing. 10/11/18   Charlott Rakes, MD  amLODipine (NORVASC) 10 MG tablet Take 1 tablet (10 mg total) by mouth daily. 10/11/18   Charlott Rakes, MD  Aspirin-Salicylamide-Caffeine (BC HEADACHE POWDER PO) Take 1 each by mouth every 8 (eight) hours as needed (pain). Reported on 05/23/2015    [provider]  clobetasol ointment (TEMOVATE) 6.16 % Apply 1 application topically 2 (two) times daily. 05/31/18   Ladell Pier, MD  hydrOXYzine (VISTARIL) 25 MG capsule TAKE 1 TABLET BY MOUTH 2 (TWO) TIMES DAILY AS NEEDED. Patient taking differently: Take 25 mg by mouth 2 (two) times daily as needed.  10/11/18   Charlott Rakes, MD  loratadine (CLARITIN) 10 MG tablet Take 1 tablet (10 mg total) by mouth daily as needed for allergies. 03/18/17   Argentina Donovan, PA-C  nicotine polacrilex (NICOTINE MINI) 2 MG lozenge Take 1 lozenge (2 mg total) by mouth as needed for smoking cessation. 03/08/18   Elsie Stain, MD  triamcinolone cream (KENALOG) 0.1 % Apply 1 application topically 2 (two) times daily. Patient not taking: Reported  on 03/08/2018 03/08/18   Storm Frisk, MD    Physical Exam: Vitals:   12/13/18 2300 12/13/18 2315 12/13/18 2330 12/13/18 2345  BP:      Pulse: 91 92 94 92  Resp: Temp:      TempSrc:      SpO2: 100% 100% 98% 97%      Constitutional: Confused, not agitated Vitals:   12/13/18 2300 12/13/18 2315 12/13/18 2330 12/13/18 2345  BP:      Pulse: 91 92 94 92  Resp: Temp:      TempSrc:      SpO2: 100% 100% 98% 97%   Eyes: PERRL, lids and conjunctivae normal ENMT: Mucous membranes are moist. Posterior  pharynx clear of any exudate or lesions.Normal dentition.  Neck: normal, supple, no masses, no thyromegaly Respiratory: clear to auscultation bilaterally, no wheezing, no crackles. Normal respiratory effort. No accessory muscle use.  Cardiovascular: Regular rate and rhythm, no murmurs / rubs / gallops. No extremity edema. 2+ pedal pulses. No carotid bruits.  Abdomen: no tenderness, no masses palpated. No hepatosplenomegaly. Bowel sounds positive.  Musculoskeletal: no clubbing / cyanosis. No joint deformity upper and lower extremities. Good ROM, no contractures. Normal muscle tone.  Skin: no rashes, lesions, ulcers. No induration Neurologic: CN 2-12 grossly intact. Sensation intact, DTR normal. Strength 5/5 in all 4.  Psychiatric: Confused and disoriented    Labs on Admission: I have personally reviewed following labs and imaging studies  CBC: Recent Labs  Lab 12/13/18 1534  WBC 9.8  NEUTROABS 6.9  HGB 13.8  HCT 40.4  MCV 87.8  PLT 240   Basic Metabolic Panel: Recent Labs  Lab 12/13/18 1534  NA 138  K 3.8  CL 102  CO2 19*  GLUCOSE 116*  BUN 19  CREATININE 1.09*  CALCIUM 9.9   GFR: Estimated Creatinine Clearance: 67.3 mL/min (A) (by C-G formula based on SCr of 1.09 mg/dL (H)). Liver Function Tests: Recent Labs  Lab 12/13/18 1534  AST 45*  ALT 42  ALKPHOS 37*  BILITOT 1.3*  PROT 7.9  ALBUMIN 4.0   No results for input(s): LIPASE, AMYLASE in the last 168 hours. Recent Labs  Lab 12/13/18 2048  AMMONIA 29   Coagulation Profile: No results for input(s): INR, PROTIME in the last 168 hours. Cardiac Enzymes: No results for input(s): CKTOTAL, CKMB, CKMBINDEX, TROPONINI in the last 168 hours. BNP (last 3 results) No results for input(s): PROBNP in the last 8760 hours. HbA1C: No results for input(s): HGBA1C in the last 72 hours. CBG: Recent Labs  Lab 12/13/18 2007  GLUCAP 95   Lipid Profile: No results for input(s): CHOL, HDL, LDLCALC, TRIG, CHOLHDL,  LDLDIRECT in the last 72 hours. Thyroid Function Tests: No results for input(s): TSH, T4TOTAL, FREET4, T3FREE, THYROIDAB in the last 72 hours. Anemia Panel: No results for input(s): VITAMINB12, FOLATE, FERRITIN, TIBC, IRON, RETICCTPCT in the last 72 hours. Urine analysis:    Component Value Date/Time   COLORURINE AMBER (A) 12/13/2018 2218   APPEARANCEUR HAZY (A) 12/13/2018 2218   LABSPEC 1.027 12/13/2018 2218   PHURINE 5.0 12/13/2018 2218   GLUCOSEU NEGATIVE 12/13/2018 2218   HGBUR SMALL (A) 12/13/2018 2218   HGBUR small 06/28/2009 1504   BILIRUBINUR SMALL (A) 12/13/2018 2218   KETONESUR 5 (A) 12/13/2018 2218   PROTEINUR 30 (A) 12/13/2018 2218   UROBILINOGEN 1.0 09/28/2014 1426   NITRITE NEGATIVE 12/13/2018 2218   LEUKOCYTESUR TRACE (A) 12/13/2018 2218  Sepsis Labs: @LABRCNTIP (procalcitonin:4,lacticidven:4) )No results found for this or any previous visit (from the past 240 hour(s)).   Radiological Exams on Admission: Ct Head Wo Contrast  Result Date: 12/13/2018 CLINICAL DATA:  Altered level of consciousness EXAM: CT HEAD WITHOUT CONTRAST TECHNIQUE: Contiguous axial images were obtained from the base of the skull through the vertex without intravenous contrast. COMPARISON:  12/02/2018 FINDINGS: Brain: Mild atrophic changes are noted stable from the prior exam. No acute hemorrhage, acute infarction or space-occupying mass lesion noted. Vascular: No hyperdense vessel or unexpected calcification. Skull: Normal. Negative for fracture or focal lesion. Sinuses/Orbits: No acute finding. Other: None. IMPRESSION: Mild atrophic changes without acute intracranial abnormality. No significant interval change from the prior exam. Electronically Signed   By: Alcide CleverMark  Lukens M.D.   On: 12/13/2018 21:11    EKG: Independently reviewed.  Shows normal sinus rhythm with right atrial enlargement rate of 91.  No significant ST change  Assessment/Plan Principal Problem:   AMS (altered mental status)  Active Problems:   History of alcohol use   TOBACCO ABUSE   Essential hypertension     #1 altered mental status: Differentials are varied.  Does not appear to be in alcohol withdrawals.  She is mildly disoriented.  Patient is at risk for acute CVA even though normal head CT.  Drug screen currently pending.  May be alcohol related but she is not showing signs of psychosis.  She is not intoxicated.  I will order an MRI of the brain to rule out acute CVA.  Watch patient closely for any signs of alcohol withdrawals.  Hold all medications until patient is fully awake and may be more history will be obtained.  Check thyroid panel  #2 hypertension: We will resume home regimen when patient is more awake.  As needed labetalol if blood pressure is above 160 systolic  #3 history of alcohol abuse: We will order thiamine and folic acid.  Watch for possible withdrawal  #4 history of tobacco abuse: We will order nicotine patch  #5 possible UTI: Urinalysis is not quite clear.  We will empirically start Rocephin and send urine for cultures   DVT prophylaxis: Lovenox Code Status: Full code Family Communication: No family at bedside Disposition Plan: To be determined Consults called: None Admission status: Inpatient  Severity of Illness: The appropriate patient status for this patient is INPATIENT. Inpatient status is judged to be reasonable and necessary in order to provide the required intensity of service to ensure the patient's safety. The patient's presenting symptoms, physical exam findings, and initial radiographic and laboratory data in the context of their chronic comorbidities is felt to place them at high risk for further clinical deterioration. Furthermore, it is not anticipated that the patient will be medically stable for discharge from the hospital within 2 midnights of admission. The following factors support the patient status of inpatient.   " The patient's presenting symptoms include  confusion. " The worrisome physical exam findings include altered mental status. " The initial radiographic and laboratory data are worrisome because of possible UTI. " The chronic co-morbidities include hypertension.   * I certify that at the point of admission it is my clinical judgment that the patient will require inpatient hospital care spanning beyond 2 midnights from the point of admission due to high intensity of service, high risk for further deterioration and high frequency of surveillance required.Lonia Blood*    Orson Rho,LAWAL MD [facility] Pager [phone]  If 7PM-7AM, please contact night-coverage [email or web address] Password [email or web address]  12/13/2018, 11:59 PM

## 2018-12-13 NOTE — ED Triage Notes (Signed)
Pt here from home when neighbors called 911 due to pt being altered , according to Ems pt had several empty etoh bottles lying around , cbg . 127 , pt also fell but has no complaints of pain pt is alert to self and place

## 2018-12-13 NOTE — ED Provider Notes (Signed)
[facility] EMERGENCY DEPARTMENT Provider Note   CSN: [id number] Arrival date & time: 12/13/18  1513     History   Chief Complaint Chief Complaint  Patient presents with  . Altered Mental Status    HPI Marie Woods is a 63 y.o. female.     HPI Patient presents to the emergency department with altered mental status.  She was brought in by EMS after neighbors called stating she was altered and confused.  Patient is unable to give me much history.  She tells me that she has on [address] and its February and its 2017.  The patient states that she has been feeling weak over the last month.  Patient states she is feels off.  Patient denies any chest pain, shortness of breath, nausea, vomiting, fever or abdominal pain.  The patient's not able to give me much other history.  Past Medical History:  Diagnosis Date  . Asthma   . Eczema   . Hypertension     Patient Active Problem List   Diagnosis Date Noted  . Chronic pain of left knee 04/12/2017  . Chronic pain of right knee 04/12/2017  . ASCUS with positive high risk HPV cervical 05/23/2015  . Healthcare maintenance 03/28/2015  . Alopecia 06/13/2014  . DEPRESSION 04/03/2010  . TOBACCO ABUSE 06/28/2009  . ALLERGIC RHINITIS 06/28/2009  . History of alcohol use 01/10/2009  . Essential hypertension 01/10/2009  . Asthma 01/10/2009  . Dermatitis, atopic 01/10/2009    History reviewed. No pertinent surgical history.   OB History   No obstetric history on file.      Home Medications    Prior to Admission medications   Medication Sig Start Date End Date Taking? Authorizing Provider  albuterol (VENTOLIN HFA) 108 (90 Base) MCG/ACT inhaler Inhale 2 puffs into the lungs every 6 (six) hours as needed for wheezing. 10/11/18   Charlott Rakes, MD  amLODipine (NORVASC) 10 MG tablet Take 1 tablet (10 mg total) by mouth daily. 10/11/18   Charlott Rakes, MD  Aspirin-Salicylamide-Caffeine (BC HEADACHE POWDER PO) Take  1 each by mouth every 8 (eight) hours as needed (pain). Reported on 05/23/2015    [provider]  clobetasol ointment (TEMOVATE) 4.09 % Apply 1 application topically 2 (two) times daily. 05/31/18   Ladell Pier, MD  hydrOXYzine (VISTARIL) 25 MG capsule TAKE 1 TABLET BY MOUTH 2 (TWO) TIMES DAILY AS NEEDED. Patient taking differently: Take 25 mg by mouth 2 (two) times daily as needed.  10/11/18   Charlott Rakes, MD  loratadine (CLARITIN) 10 MG tablet Take 1 tablet (10 mg total) by mouth daily as needed for allergies. 03/18/17   Argentina Donovan, PA-C  nicotine polacrilex (NICOTINE MINI) 2 MG lozenge Take 1 lozenge (2 mg total) by mouth as needed for smoking cessation. 03/08/18   Elsie Stain, MD  triamcinolone cream (KENALOG) 0.1 % Apply 1 application topically 2 (two) times daily. Patient not taking: Reported on 03/08/2018 03/08/18   Elsie Stain, MD    Family History Family History  Problem Relation Age of Onset  . Cancer Mother   . Heart disease Mother   . Heart disease Father     Social History Social History   Tobacco Use  . Smoking status: Current Every Day Smoker    Packs/day: 0.25    Types: Cigarettes  . Smokeless tobacco: Current User  Substance Use Topics  . Alcohol use: Yes    Comment: 1 beer daily after  work  . Drug use: No     Allergies   Patient has no known allergies.   Review of Systems Review of Systems  Level 5 caveat applies due to altered mental status Physical Exam Updated Vital Signs BP (!) 122/99   Pulse 80   Temp 97.7 F (36.5 C)   Resp 15   SpO2 99%   Physical Exam Vitals signs and nursing note reviewed.  Constitutional:      General: She is not in acute distress.    Appearance: She is well-developed.  HENT:     Head: Normocephalic and atraumatic.  Eyes:     Pupils: Pupils are equal, round, and reactive to light.  Neck:     Musculoskeletal: Normal range of motion and neck supple.  Cardiovascular:     Rate and  Rhythm: Normal rate and regular rhythm.     Heart sounds: Normal heart sounds. No murmur. No friction rub. No gallop.   Pulmonary:     Effort: Pulmonary effort is normal. No respiratory distress.     Breath sounds: Normal breath sounds. No wheezing.  Abdominal:     General: Bowel sounds are normal. There is no distension.     Palpations: Abdomen is soft.     Tenderness: There is no abdominal tenderness.  Skin:    General: Skin is warm and dry.     Capillary Refill: Capillary refill takes less than 2 seconds.     Findings: No erythema or rash.  Neurological:     General: No focal deficit present.     Mental Status: She is alert. She is disoriented and confused.     Motor: No abnormal muscle tone.     Coordination: Coordination normal.  Psychiatric:        Behavior: Behavior normal.      ED Treatments / Results  Labs (all labs ordered are listed, but only abnormal results are displayed) Labs Reviewed  COMPREHENSIVE METABOLIC PANEL - Abnormal; Notable for the following components:      Result Value   CO2 19 (*)    Glucose, Bld 116 (*)    Creatinine, Ser 1.09 (*)    AST 45 (*)    Alkaline Phosphatase 37 (*)    Total Bilirubin 1.3 (*)    GFR calc non Af Amer 55 (*)    Anion gap 17 (*)    All other components within normal limits  CBC WITH DIFFERENTIAL/PLATELET - Abnormal; Notable for the following components:   nRBC 0.6 (*)    Monocytes Absolute 1.1 (*)    Abs Immature Granulocytes 0.15 (*)    All other components within normal limits  URINALYSIS, COMPLETE (UACMP) WITH MICROSCOPIC - Abnormal; Notable for the following components:   Color, Urine RED (*)    APPearance TURBID (*)    Glucose, UA   (*)    Value: TEST NOT REPORTED DUE TO COLOR INTERFERENCE OF URINE PIGMENT   Hgb urine dipstick   (*)    Value: TEST NOT REPORTED DUE TO COLOR INTERFERENCE OF URINE PIGMENT   Bilirubin Urine   (*)    Value: TEST NOT REPORTED DUE TO COLOR INTERFERENCE OF URINE PIGMENT   Ketones,  ur   (*)    Value: TEST NOT REPORTED DUE TO COLOR INTERFERENCE OF URINE PIGMENT   Protein, ur   (*)    Value: TEST NOT REPORTED DUE TO COLOR INTERFERENCE OF URINE PIGMENT   Nitrite   (*)    Value: TEST  NOT REPORTED DUE TO COLOR INTERFERENCE OF URINE PIGMENT   Leukocytes,Ua   (*)    Value: TEST NOT REPORTED DUE TO COLOR INTERFERENCE OF URINE PIGMENT   Bacteria, UA MANY (*)    All other components within normal limits  URINE CULTURE  SARS CORONAVIRUS 2 (TAT 6-24 HRS)  ETHANOL  AMMONIA  LACTIC ACID, PLASMA  URINALYSIS, ROUTINE W REFLEX MICROSCOPIC  CBG MONITORING, ED    EKG EKG Interpretation  Date/Time:  Tuesday December 13 2018 15:19:58 EST Ventricular Rate:  91 PR Interval:  160 QRS Duration: 74 QT Interval:  396 QTC Calculation: 487 R Axis:   13 Text Interpretation: Normal sinus rhythm Right atrial enlargement Borderline ECG Confirmed by Kristine RoyalMessick, Peter [phone]([id number]) on 12/13/2018 8:47:01 PM   Radiology Ct Head Wo Contrast  Result Date: 12/13/2018 CLINICAL DATA:  Altered level of consciousness EXAM: CT HEAD WITHOUT CONTRAST TECHNIQUE: Contiguous axial images were obtained from the base of the skull through the vertex without intravenous contrast. COMPARISON:  12/02/2018 FINDINGS: Brain: Mild atrophic changes are noted stable from the prior exam. No acute hemorrhage, acute infarction or space-occupying mass lesion noted. Vascular: No hyperdense vessel or unexpected calcification. Skull: Normal. Negative for fracture or focal lesion. Sinuses/Orbits: No acute finding. Other: None. IMPRESSION: Mild atrophic changes without acute intracranial abnormality. No significant interval change from the prior exam. Electronically Signed   By: Alcide CleverMark  Lukens M.D.   On: 12/13/2018 21:11    Procedures Procedures (including critical care time)  Medications Ordered in ED Medications - No data to display   Initial Impression / Assessment and Plan / ED Course  I have reviewed the triage vital signs  and the nursing notes.  Pertinent labs & imaging results that were available during my care of the patient were reviewed by me and considered in my medical decision making (see chart for details).       Patient most likely has a metabolic encephalopathy and that is what is causing her alteration in mental status.  I will have the patient admitted to the hospital for further care and evaluation.  The patient was here on October 30 and did not have alterations in her mentation at that time.  We will order an MRI as well. Final Clinical Impressions(s) / ED Diagnoses   Final diagnoses:  None    ED Discharge Orders    None       Kyra MangesLawyer, Freman Lapage, PA-C 12/13/18 2233    Wynetta FinesMessick, Peter C, MD 12/13/18 2258

## 2018-12-14 ENCOUNTER — Inpatient Hospital Stay (HOSPITAL_COMMUNITY): Payer: Medicaid Other

## 2018-12-14 LAB — COMPREHENSIVE METABOLIC PANEL
ALT: 36 U/L (ref 0–44)
AST: 35 U/L (ref 15–41)
Albumin: 3.7 g/dL (ref 3.5–5.0)
Alkaline Phosphatase: 31 U/L — ABNORMAL LOW (ref 38–126)
Anion gap: 14 (ref 5–15)
BUN: 17 mg/dL (ref 8–23)
CO2: 20 mmol/L — ABNORMAL LOW (ref 22–32)
Calcium: 9.5 mg/dL (ref 8.9–10.3)
Chloride: 105 mmol/L (ref 98–111)
Creatinine, Ser: 0.91 mg/dL (ref 0.44–1.00)
GFR calc Af Amer: >60 mL/min (ref >60)
GFR calc non Af Amer: >60 mL/min (ref >60)
Glucose, Bld: 115 mg/dL — ABNORMAL HIGH (ref 70–99)
Potassium: 4 mmol/L (ref 3.5–5.1)
Sodium: 139 mmol/L (ref 135–145)
Total Bilirubin: 0.9 mg/dL (ref 0.3–1.2)
Total Protein: 7.1 g/dL (ref 6.5–8.1)

## 2018-12-14 LAB — RAPID URINE DRUG SCREEN, HOSP PERFORMED
Amphetamines: NOT DETECTED
Barbiturates: NOT DETECTED
Benzodiazepines: NOT DETECTED
Cocaine: NOT DETECTED
Opiates: NOT DETECTED
Tetrahydrocannabinol: NOT DETECTED

## 2018-12-14 LAB — CBC
HCT: 40 % (ref 36.0–46.0)
HCT: 41.4 % (ref 36.0–46.0)
Hemoglobin: 13.7 g/dL (ref 12.0–15.0)
Hemoglobin: 13.7 g/dL (ref 12.0–15.0)
MCH: 30.2 pg (ref 26.0–34.0)
MCH: 29.8 pg (ref 26.0–34.0)
MCHC: 34.3 g/dL (ref 30.0–36.0)
MCHC: 33.1 g/dL (ref 30.0–36.0)
MCV: 88.3 fL (ref 80.0–100.0)
MCV: 90 fL (ref 80.0–100.0)
Platelets: 127 10*3/uL — ABNORMAL LOW (ref 150–400)
Platelets: 225 10*3/uL (ref 150–400)
RBC: 4.53 MIL/uL (ref 3.87–5.11)
RBC: 4.6 MIL/uL (ref 3.87–5.11)
RDW: 13.7 % (ref 11.5–15.5)
RDW: 14 % (ref 11.5–15.5)
WBC: 10.4 10*3/uL (ref 4.0–10.5)
WBC: 12.4 10*3/uL — ABNORMAL HIGH (ref 4.0–10.5)
nRBC: 0.5 % — ABNORMAL HIGH (ref 0.0–0.2)
nRBC: 0.2 % (ref 0.0–0.2)

## 2018-12-14 LAB — CREATININE, SERUM
Creatinine, Ser: 0.91 mg/dL (ref 0.44–1.00)
GFR calc Af Amer: >60 mL/min (ref >60)
GFR calc non Af Amer: >60 mL/min (ref >60)

## 2018-12-14 LAB — SARS CORONAVIRUS 2 (TAT 6-24 HRS): SARS Coronavirus 2: NEGATIVE

## 2018-12-14 LAB — HIV ANTIBODY (ROUTINE TESTING W REFLEX): HIV Screen 4th Generation wRfx: NONREACTIVE

## 2018-12-14 MED ORDER — ONDANSETRON HCL 4 MG PO TABS
4.0000 mg | ORAL_TABLET | Freq: Four times a day (QID) | ORAL | Status: DC | PRN
  Administered 2019-02-25 – 2019-02-28 (×2): 4 mg via ORAL
  Filled 2018-12-14 (×3): qty 1

## 2018-12-14 MED ORDER — ENOXAPARIN SODIUM 40 MG/0.4ML ~~LOC~~ SOLN
40.0000 mg | SUBCUTANEOUS | Status: DC
  Administered 2018-12-14 – 2019-05-19 (×146): 40 mg via SUBCUTANEOUS
  Filled 2018-12-14 (×150): qty 0.4

## 2018-12-14 MED ORDER — THIAMINE HCL 100 MG/ML IJ SOLN
100.0000 mg | Freq: Every day | INTRAMUSCULAR | Status: DC
  Administered 2018-12-14: 12:00:00 100 mg via INTRAVENOUS
  Filled 2018-12-14 (×2): qty 2

## 2018-12-14 MED ORDER — ACETAMINOPHEN 650 MG RE SUPP: 650.0000 mg | Freq: Four times a day (QID) | RECTAL | Status: DC | PRN

## 2018-12-14 MED ORDER — ACETAMINOPHEN 325 MG PO TABS
650.0000 mg | ORAL_TABLET | Freq: Four times a day (QID) | ORAL | Status: DC | PRN
  Administered 2018-12-14 – 2019-01-03 (×25): 650 mg via ORAL
  Filled 2018-12-14 (×27): qty 2

## 2018-12-14 MED ORDER — NICOTINE 21 MG/24HR TD PT24
21.0000 mg | MEDICATED_PATCH | Freq: Every day | TRANSDERMAL | Status: DC
  Administered 2018-12-14 – 2019-02-14 (×62): 21 mg via TRANSDERMAL
  Filled 2018-12-14 (×64): qty 1

## 2018-12-14 MED ORDER — ONDANSETRON HCL 4 MG/2ML IJ SOLN: 4.0000 mg | Freq: Four times a day (QID) | INTRAMUSCULAR | Status: DC | PRN

## 2018-12-14 MED ORDER — DEXTROSE-NACL 5-0.9 % IV SOLN
INTRAVENOUS | Status: DC
  Administered 2018-12-14 – 2018-12-17 (×2): via INTRAVENOUS

## 2018-12-14 MED ORDER — SODIUM CHLORIDE 0.9 % IV SOLN
1.0000 g | INTRAVENOUS | Status: DC
  Administered 2018-12-14 – 2018-12-17 (×4): 1 g via INTRAVENOUS
  Filled 2018-12-14 (×4): qty 10

## 2018-12-14 NOTE — ED Notes (Signed)
Marie Woods, emergency contact [phone] looking for an update

## 2018-12-14 NOTE — ED Notes (Signed)
Pt sitting up and nibbling on food. Pt alert and oriented X2. Self and place.

## 2018-12-14 NOTE — ED Notes (Signed)
Placed both breakfast and lunch order.

## 2018-12-14 NOTE — ED Notes (Signed)
Lunch Tray Ordered @ 1127.  

## 2018-12-14 NOTE — Progress Notes (Signed)
Patient voided, urine color pink. Tylene Fantasia paged, will continue to monitor.

## 2018-12-14 NOTE — Progress Notes (Signed)
PROGRESS NOTE    Marie Woods  TGG:[id number] DOB: 10-17-1955 DOA: 12/13/2018 PCP: Ladell Pier, MD   Brief Narrative:  HPI On 12/13/2018 by Dr. Gala Romney Marie Woods is a 62 y.o. female with medical history significant of asthma, hypertension, alcohol abuse who was brought in by EMS after neighbors called in stating that patient is confused and altered.  She is currently disoriented and not able to give coherent history.  Most history therefore was obtained from the neighbors who assisted EMS.  Has been sick gradually over a month.  Apparently.  She has however been coherent able to communicate without any known problems.  Today however she is not herself.  She told me she thinks she is somewhere in the house not in the hospital.  She is not tremulous and she is not aggressive.  Her alcohol level was negative.  No reported drug use.  Patient is being admitted with altered mental status for work-up.  So far no UTI or pneumonia identified.. Assessment & Plan   Severe Sepsis with Acute metabolic encephalopathy secondary to UTI -On presentation, patient noted to be tachycardic with tachypnea in the emergency department.  Now patient with leukocytosis -CT head mild atrophic changes without acute intracranial abnormality -Chest x-ray unremarkable for infection -UA rare bacteria, 11-20 WBC, trace leukocytes -Urine culture pending -Continue ceftriaxone, IV fluids -Status slowly improving, patient is alert and oriented to person, place, and time, however not to condition -There was question of patient having alcohol abuse however alcohol level less than 10.  Urine drug screen pending.  Essential Hypertension -Will restart amlodipine  History of alcohol abuse -As above, alcohol level less than 10 -Continue folate, thiamine -Monitor for withdrawal  History of tobacco abuse -Continue nicotine patch  Bed bug/ Deconditioning -bed bug found on patient, continue contact precautions  -will consult PT/OT, SW/CM  DVT Prophylaxis  Lovenox  Code Status: Full  Family Communication: None at bedside  Disposition Plan: Admitted. Pending improvement in mental status  Consultants None  Procedures  None  Antibiotics   Anti-infectives (From admission, onward)   Start     Dose/Rate Route Frequency Ordered Stop   12/14/18 0100  cefTRIAXone (ROCEPHIN) 1 g in sodium chloride 0.9 % 100 mL IVPB     1 g 200 mL/hr over 30 Minutes Intravenous Every 24 hours 12/14/18 0010        Subjective:   Marie Woods seen and examined today.  Patient knows that she is in the hospital however does not know why she is here.  She denies any chest pain, shortness of breath, abdominal pain, nausea or vomiting, diarrhea or constipation, dizziness or headache.  She states that she has been feeling weak and tired.  Objective:   Vitals:   12/14/18 0800 12/14/18 0815 12/14/18 0830 12/14/18 0845  BP: (!) 125/92 126/87 129/90 (!) 141/92  Pulse: 86  94 87  Resp: 19 19 17 18   Temp:      TempSrc:      SpO2: 98%  98% 97%   No intake or output data in the 24 hours ending 12/14/18 0916 There were no vitals filed for this visit.  Exam   General: Well developed, chronically ill appearing, NAD, older than stated age  31: NCAT, mucous membranes moist.   Cardiovascular: S1 S2 auscultated, RRR, no murmur  Respiratory: Clear to auscultation bilaterally  Abdomen: Soft, nontender, nondistended, + bowel sounds  Extremities: warm dry without cyanosis clubbing or edema  Neuro: AAOx3,  slow to respond, nonfocal  Psych: Normal affect and demeanor   Data Reviewed: I have personally reviewed following labs and imaging studies  CBC: Recent Labs  Lab 12/13/18 1534 12/14/18 0030 12/14/18 0454  WBC 9.8 10.4 12.4*  NEUTROABS 6.9  --   --   HGB 13.8 13.7 13.7  HCT 40.4 40.0 41.4  MCV 87.8 88.3 90.0  PLT 240 127* 225   Basic Metabolic Panel: Recent Labs  Lab 12/13/18 1534 12/14/18 0030  12/14/18 0454  NA 138  --  139  K 3.8  --  4.0  CL 102  --  105  CO2 19*  --  20*  GLUCOSE 116*  --  115*  BUN 19  --  17  CREATININE 1.09* 0.91 0.91  CALCIUM 9.9  --  9.5   GFR: Estimated Creatinine Clearance: 80.7 mL/min (by C-G formula based on SCr of 0.91 mg/dL). Liver Function Tests: Recent Labs  Lab 12/13/18 1534 12/14/18 0454  AST 45* 35  ALT 42 36  ALKPHOS 37* 31*  BILITOT 1.3* 0.9  PROT 7.9 7.1  ALBUMIN 4.0 3.7   No results for input(s): LIPASE, AMYLASE in the last 168 hours. Recent Labs  Lab 12/13/18 2048  AMMONIA 29   Coagulation Profile: No results for input(s): INR, PROTIME in the last 168 hours. Cardiac Enzymes: No results for input(s): CKTOTAL, CKMB, CKMBINDEX, TROPONINI in the last 168 hours. BNP (last 3 results) No results for input(s): PROBNP in the last 8760 hours. HbA1C: No results for input(s): HGBA1C in the last 72 hours. CBG: Recent Labs  Lab 12/13/18 2007  GLUCAP 95   Lipid Profile: No results for input(s): CHOL, HDL, LDLCALC, TRIG, CHOLHDL, LDLDIRECT in the last 72 hours. Thyroid Function Tests: No results for input(s): TSH, T4TOTAL, FREET4, T3FREE, THYROIDAB in the last 72 hours. Anemia Panel: No results for input(s): VITAMINB12, FOLATE, FERRITIN, TIBC, IRON, RETICCTPCT in the last 72 hours. Urine analysis:    Component Value Date/Time   COLORURINE AMBER (A) 12/13/2018 2218   APPEARANCEUR HAZY (A) 12/13/2018 2218   LABSPEC 1.027 12/13/2018 2218   PHURINE 5.0 12/13/2018 2218   GLUCOSEU NEGATIVE 12/13/2018 2218   HGBUR SMALL (A) 12/13/2018 2218   HGBUR small 06/28/2009 1504   BILIRUBINUR SMALL (A) 12/13/2018 2218   KETONESUR 5 (A) 12/13/2018 2218   PROTEINUR 30 (A) 12/13/2018 2218   UROBILINOGEN 1.0 09/28/2014 1426   NITRITE NEGATIVE 12/13/2018 2218   LEUKOCYTESUR TRACE (A) 12/13/2018 2218   Sepsis Labs: @LABRCNTIP (procalcitonin:4,lacticidven:4)  ) Recent Results (from the past 240 hour(s))  SARS CORONAVIRUS 2 (TAT 6-24  HRS) Nasopharyngeal     Status: None   Collection Time: 12/13/18  9:58 PM   Specimen: Nasopharyngeal  Result Value Ref Range Status   SARS Coronavirus 2 NEGATIVE NEGATIVE Final    Comment: (NOTE) SARS-CoV-2 target nucleic acids are NOT DETECTED. The SARS-CoV-2 RNA is generally detectable in upper and lower respiratory specimens during the acute phase of infection. Negative results do not preclude SARS-CoV-2 infection, do not rule out co-infections with other pathogens, and should not be used as the sole basis for treatment or other patient management decisions. Negative results must be combined with clinical observations, patient history, and epidemiological information. The expected result is Negative. Fact Sheet for Patients: 13/10/20 Fact Sheet for Healthcare Providers: HairSlick.no This test is not yet approved or cleared by the quierodirigir.com FDA and  has been authorized for detection and/or diagnosis of SARS-CoV-2 by FDA under an Emergency Use Authorization (  EUA). This EUA will remain  in effect (meaning this test can be used) for the duration of the COVID-19 declaration under Section 56 4(b)(1) of the Act, 21 U.S.C. section 360bbb-3(b)(1), unless the authorization is terminated or revoked sooner. Performed at [facility], [address] [place], [place] [address]       Radiology Studies: Ct Head Wo Contrast  Result Date: 12/13/2018 CLINICAL DATA:  Altered level of consciousness EXAM: CT HEAD WITHOUT CONTRAST TECHNIQUE: Contiguous axial images were obtained from the base of the skull through the vertex without intravenous contrast. COMPARISON:  12/02/2018 FINDINGS: Brain: Mild atrophic changes are noted stable from the prior exam. No acute hemorrhage, acute infarction or space-occupying mass lesion noted. Vascular: No hyperdense vessel or unexpected calcification. Skull: Normal. Negative for fracture or  focal lesion. Sinuses/Orbits: No acute finding. Other: None. IMPRESSION: Mild atrophic changes without acute intracranial abnormality. No significant interval change from the prior exam. Electronically Signed   By: Alcide CleverMark  Lukens M.D.   On: 12/13/2018 21:11   Dg Chest Port 1 View  Result Date: 12/14/2018 CLINICAL DATA:  Altered mental status EXAM: PORTABLE CHEST 1 VIEW COMPARISON:  12/02/2018 FINDINGS: The heart size and mediastinal contours are within normal limits. Both lungs are clear. The visualized skeletal structures are unremarkable. IMPRESSION: No active disease. Electronically Signed   By: Deatra RobinsonKevin  Herman M.D.   On: 12/14/2018 03:55     Scheduled Meds: . enoxaparin (LOVENOX) injection  40 mg Subcutaneous Q24H  . nicotine  21 mg Transdermal Daily  . thiamine injection  100 mg Intravenous Daily   Continuous Infusions: . cefTRIAXone (ROCEPHIN)  IV Stopped (12/14/18 0107)  . dextrose 5 % and 0.9% NaCl 125 mL/hr at 12/14/18 0157     LOS: 1 day   Time Spent in minutes   45 minutes  Jasher Barkan D.O. on 12/14/2018 at 9:16 AM  Between 7am to 7pm - Please see pager noted on [email or web address]  After 7pm go to [email or web address]  And look for the night coverage person covering for me after hours  [facility] Office  [phone]

## 2018-12-14 NOTE — Progress Notes (Signed)
Marie Woods [id number]  Code Status: FULL  Admission Data: 12/14/2018 6:55 PM  Attending Provider: Fahima Cifelli is a 63 y.o. female patient admitted from ED awake, alert - oriented X 2 - no acute distress noted. VSS - Blood pressure (!) 123/95, pulse 89, temperature 98.1 F (36.7 C), temperature source Oral, resp. rate 16, SpO2 100 %. no c/o shortness of breath, no c/o chest pain. Orientation to room, and floor completed with information packet given to patient/family. Admission INP armband ID verified with patient/family, and in place.  SR up x 2, fall assessment complete, with patient and family able to verbalize understanding of risk associated with falls, and verbalized understanding to call nsg before up out of bed. Call light within reach, patient able to voice, and demonstrate understanding. Skin, clean-dry- intact without evidence of bruising, or skin tears.  No evidence of skin break down noted on exam.   Patient asking where her phone, wallet, and keys are located. Belongings checked but unable to locate these items. Patient made aware but keeps forgetting what she was told. ?  Will cont to eval and treat per MD orders.  Melonie Florida, RN  12/14/2018

## 2018-12-15 ENCOUNTER — Inpatient Hospital Stay (HOSPITAL_COMMUNITY): Payer: Medicaid Other

## 2018-12-15 ENCOUNTER — Other Ambulatory Visit: Payer: Self-pay

## 2018-12-15 LAB — BASIC METABOLIC PANEL
Anion gap: 14 (ref 5–15)
BUN: 17 mg/dL (ref 8–23)
CO2: 20 mmol/L — ABNORMAL LOW (ref 22–32)
Calcium: 9.5 mg/dL (ref 8.9–10.3)
Chloride: 105 mmol/L (ref 98–111)
Creatinine, Ser: 0.86 mg/dL (ref 0.44–1.00)
GFR calc Af Amer: >60 mL/min (ref >60)
GFR calc non Af Amer: >60 mL/min (ref >60)
Glucose, Bld: 98 mg/dL (ref 70–99)
Potassium: 3.6 mmol/L (ref 3.5–5.1)
Sodium: 139 mmol/L (ref 135–145)

## 2018-12-15 LAB — CBC
HCT: 43.4 % (ref 36.0–46.0)
Hemoglobin: 14.9 g/dL (ref 12.0–15.0)
MCH: 29.8 pg (ref 26.0–34.0)
MCHC: 34.3 g/dL (ref 30.0–36.0)
MCV: 86.8 fL (ref 80.0–100.0)
Platelets: 247 10*3/uL (ref 150–400)
RBC: 5 MIL/uL (ref 3.87–5.11)
RDW: 13.4 % (ref 11.5–15.5)
WBC: 13.3 10*3/uL — ABNORMAL HIGH (ref 4.0–10.5)
nRBC: 0.8 % — ABNORMAL HIGH (ref 0.0–0.2)

## 2018-12-15 LAB — URINE CULTURE

## 2018-12-15 LAB — MAGNESIUM: Magnesium: 2.4 mg/dL (ref 1.7–2.4)

## 2018-12-15 LAB — PHOSPHORUS: Phosphorus: 4.1 mg/dL (ref 2.5–4.6)

## 2018-12-15 LAB — LACTIC ACID, PLASMA: Lactic Acid, Venous: 1.7 mmol/L (ref 0.5–1.9)

## 2018-12-15 MED ORDER — VITAMIN B-1 100 MG PO TABS
100.0000 mg | ORAL_TABLET | Freq: Every day | ORAL | Status: DC
  Administered 2018-12-15: 100 mg via ORAL
  Filled 2018-12-15: qty 1

## 2018-12-15 MED ORDER — LORAZEPAM 2 MG/ML IJ SOLN: 1.0000 mg | INTRAMUSCULAR | Status: AC | PRN

## 2018-12-15 MED ORDER — THIAMINE HCL 100 MG/ML IJ SOLN
500.0000 mg | INTRAVENOUS | Status: DC
  Administered 2018-12-15: 500 mg via INTRAVENOUS
  Filled 2018-12-15 (×2): qty 5

## 2018-12-15 MED ORDER — LORAZEPAM 1 MG PO TABS
1.0000 mg | ORAL_TABLET | ORAL | Status: AC | PRN
  Administered 2018-12-16 (×2): 1 mg via ORAL
  Administered 2018-12-16: 2 mg via ORAL
  Administered 2018-12-16 – 2018-12-17 (×4): 1 mg via ORAL
  Administered 2018-12-18: 09:00:00 2 mg via ORAL
  Filled 2018-12-15: qty 1
  Filled 2018-12-15: qty 2
  Filled 2018-12-15 (×4): qty 1
  Filled 2018-12-15: qty 2
  Filled 2018-12-15: qty 1

## 2018-12-15 MED ORDER — FOLIC ACID 1 MG PO TABS
1.0000 mg | ORAL_TABLET | Freq: Every day | ORAL | Status: DC
  Administered 2018-12-15 – 2019-05-18 (×154): 1 mg via ORAL
  Filled 2018-12-15 (×156): qty 1

## 2018-12-15 MED ORDER — ADULT MULTIVITAMIN W/MINERALS CH
1.0000 | ORAL_TABLET | Freq: Every day | ORAL | Status: DC
  Administered 2018-12-15 – 2019-05-18 (×154): 1 via ORAL
  Filled 2018-12-15 (×156): qty 1

## 2018-12-15 MED ORDER — VITAMIN B-1 100 MG PO TABS: 100.0000 mg | ORAL_TABLET | Freq: Every day | ORAL | Status: DC

## 2018-12-15 NOTE — Evaluation (Signed)
Occupational Therapy Evaluation Patient Details Name: Marie Woods MRN: [id number] DOB: [birth year] Today's Date: 12/15/2018    History of Present Illness Patient is a 63 year old female with medical history significant of asthma, hypertension, alcohol abuse who was brought in by EMS after neighbors called in stating that patient is confused and altered. Patient admitted Severe Sepsis with Acute metabolic encephalopathy secondary to UTI.    Clinical Impression   Patient is a 63 year old female that lives in second level apartment with 12 steps to enter. Patient reports she is independent with self care tasks, occasionally uses a cane for ambulation, denies any other home AD/DME. Patient is tangential and perseverating on finding her belonging (phone, keys), verbal cues to redirect. Patient require min A to stand from elevated surface with min A ambulation with rolling walker and mod verbal cues for safe use of rolling walker as she pushes it too far forward/keeps lower extremities outside the frame of walker when turning.     Follow Up Recommendations  SNF;Supervision/Assistance - 24 hour    Equipment Recommendations  Other (comment)(defer to next venue)       Precautions / Restrictions Precautions Precautions: Fall Precaution Comments: Contact precautions- bed bugs Restrictions Weight Bearing Restrictions: No      Mobility Bed Mobility Overal bed mobility: Modified Independent                Transfers Overall transfer level: Needs assistance Equipment used: Rolling walker (2 wheeled) Transfers: Sit to/from Stand Sit to Stand: Min assist;From elevated surface         General transfer comment: patient requires verbal cues for safety/sequencing with rolling walker    Balance Overall balance assessment: Needs assistance Sitting-balance support: Feet supported Sitting balance-Leahy Scale: Good     Standing balance support: Bilateral upper extremity supported;During  functional activity Standing balance-Leahy Scale: Poor                             ADL either performed or assessed with clinical judgement   ADL Overall ADL's : Needs assistance/impaired     Grooming: Set up;Sitting   Upper Body Bathing: Set up;Sitting   Lower Body Bathing: Minimal assistance;Sit to/from stand;Moderate assistance   Upper Body Dressing : Set up;Sitting   Lower Body Dressing: Minimal assistance;Sitting/lateral leans;Moderate assistance   Toilet Transfer: Minimal assistance;Cueing for safety;Cueing for sequencing;Ambulation;BSC;RW Toilet Transfer Details (indicate cue type and reason): difficulty with sequencing with rolling walker, feet often outside frame of walker or pushing too far forward Toileting- Clothing Manipulation and Hygiene: Minimal assistance;Sit to/from stand               Vision Baseline Vision/History: Wears glasses Wears Glasses: Reading only              Pertinent Vitals/Pain Pain Assessment: No/denies pain     Hand Dominance Right   Extremity/Trunk Assessment Upper Extremity Assessment Upper Extremity Assessment: Generalized weakness   Lower Extremity Assessment Lower Extremity Assessment: Defer to PT evaluation       Communication Communication Communication: No difficulties   Cognition Arousal/Alertness: Awake/alert Behavior During Therapy: (tangential) Overall Cognitive Status: Impaired/Different from baseline Area of Impairment: Following commands;Safety/judgement;Awareness;Attention;Memory                   Current Attention Level: Sustained Memory: Decreased short-term memory Following Commands: Follows one step commands inconsistently;Follows one step commands with increased time Safety/Judgement: Decreased awareness of safety Awareness: Emergent  General Comments: patient does not know month/year, knows shes in the hospital in [facility] Family/patient  expects to be discharged to:: Private residence Living Arrangements: Alone Available Help at Discharge: Friend(s);Available PRN/intermittently Type of Home: Apartment Home Access: Stairs to enter Entrance Stairs-Number of Steps: 12 Entrance Stairs-Rails: Right Home Layout: One level     Bathroom Shower/Tub: Chief Strategy Officer: Standard     Home Equipment: Cane - single point;Grab bars - tub/shower   Additional Comments: Patient uses taxi service for transportation      Prior Functioning/Environment Level of Independence: Independent with assistive device(s)        Comments: patient reports using cane intermittently        OT Problem List: Decreased strength;Decreased activity tolerance;Impaired balance (sitting and/or standing);Decreased cognition;Decreased safety awareness;Obesity      OT Treatment/Interventions: Self-care/ADL training;Therapeutic exercise;Therapeutic activities;Cognitive remediation/compensation;Patient/family education;Balance training    OT Goals(Current goals can be found in the care plan section) Acute Rehab OT Goals Patient Stated Goal: "where is my phone, I need to make a call" OT Goal Formulation: With patient Time For Goal Achievement: 12/29/18 Potential to Achieve Goals: Good  OT Frequency: Min 2X/week           Co-evaluation PT/OT/SLP Co-Evaluation/Treatment: Yes Reason for Co-Treatment: To address functional/ADL transfers   OT goals addressed during session: ADL's and self-care      AM-PAC OT "6 Clicks" Daily Activity     Outcome Measure Help from another person eating meals?: None Help from another person taking care of personal grooming?: A Little Help from another person toileting, which includes using toliet, bedpan, or urinal?: A Little Help from another person bathing (including washing, rinsing, drying)?: A Lot Help from another person to put on and taking off regular upper body clothing?: A Little Help  from another person to put on and taking off regular lower body clothing?: A Lot 6 Click Score: 17   End of Session Equipment Utilized During Treatment: Gait belt;Rolling walker  Activity Tolerance: Patient tolerated treatment well Patient left: in chair;with call bell/phone within reach;with chair alarm set  OT Visit Diagnosis: Unsteadiness on feet (R26.81);Other abnormalities of gait and mobility (R26.89);Muscle weakness (generalized) (M62.81);Other symptoms and signs involving cognitive function                Time: 5809-9833 OT Time Calculation (min): 22 min Charges:  OT General Charges $OT Visit: 1 Visit OT Evaluation $OT Eval Moderate Complexity: 1 Mod  Myrtie Neither OT OT office: [phone]  Carmelia Roller 12/15/2018, 11:46 AM

## 2018-12-15 NOTE — Progress Notes (Signed)
PROGRESS NOTE    Marie Woods  GNF:[id number] DOB: [birth year] DOA: 12/13/2018 PCP: Marcine MatarJohnson, Deborah B, MD   Brief Narrative:  HPI On 12/13/2018 by Dr. Earlie LouMohammad Garba Marie Congressdith D Dippolito is a 63 y.o. female with medical history significant of asthma, hypertension, alcohol abuse who was brought in by EMS after neighbors called in stating that patient is confused and altered.  She is currently disoriented and not able to give coherent history.  Most history therefore was obtained from the neighbors who assisted EMS.  Has been sick gradually over a month.  Apparently.  She has however been coherent able to communicate without any known problems.  Today however she is not herself.  She told me she thinks she is somewhere in the house not in the hospital.  She is not tremulous and she is not aggressive.  Her alcohol level was negative.  No reported drug use.  Patient is being admitted with altered mental status for work-up.  So far no UTI or pneumonia identified..  Interim history  Patient admitted with acute metabolic encephalopathy thought to be secondary to UTI.  Urine culture showed multiple species, will recollect.  Patient continues to have some confusion, MRI brain pending. Assessment & Plan   Severe Sepsis with Acute metabolic encephalopathy secondary to UTI -On presentation, patient noted to be tachycardic with tachypnea in the emergency department.  Now patient with leukocytosis -CT head mild atrophic changes without acute intracranial abnormality -Chest x-ray unremarkable for infection -UA rare bacteria, 11-20 WBC, trace leukocytes -Urine culture showed multiple species, will re-culture as patient had pink urine overnight -Continue ceftriaxone, IV fluids -Status slowly improving, patient is alert and oriented to person, place, and time, however not situation.  Difficult to keep patient focused and needing to be reoriented frequently. -There was question of patient having alcohol abuse  however alcohol level less than 10.  Urine drug screen negative -pending MRI brain  Essential Hypertension -Amlodipine currently held, BP stable  History of alcohol abuse -As above, alcohol level less than 10 -Continue folate, thiamine, multivitamin -Monitor for withdrawal- have placed patient on CIWA protocol  History of tobacco abuse -Continue nicotine patch  Bed bug/ Deconditioning -bed bug found on patient, continue contact precautions -PT/OT- pending -SW/CM consulted  DVT Prophylaxis  Lovenox  Code Status: Full  Family Communication: None at bedside  Disposition Plan: Admitted. Pending improvement in mental status  Consultants None  Procedures  None  Antibiotics   Anti-infectives (From admission, onward)   Start     Dose/Rate Route Frequency Ordered Stop   12/14/18 0100  cefTRIAXone (ROCEPHIN) 1 g in sodium chloride 0.9 % 100 mL IVPB     1 g 200 mL/hr over 30 Minutes Intravenous Every 24 hours 12/14/18 0010        Subjective:   Marie Woods seen and examined today.  Patient continues to state that she is missing her black jeans as well as wallet.  Knows that she is in the hospital, however states is because she fell.  Denies current chest pain or shortness of breath, abdominal pain, nausea or vomiting, diarrhea or constipation.  Objective:   Vitals:   12/14/18 1721 12/14/18 2230 12/15/18 0101 12/15/18 0648  BP: (!) 123/95 129/79  113/85  Pulse: 89 (!) 101  81  Resp: 16     Temp: 98.1 F (36.7 C) 97.8 F (36.6 C)  98.1 F (36.7 C)  TempSrc: Oral Oral  Oral  SpO2: 100% 99%  100%  Weight:  98.4 kg   Height:   5\' 7"  (1.702 m)     Intake/Output Summary (Last 24 hours) at 12/15/2018 1610 Last data filed at 12/15/2018 0047 Gross per 24 hour  Intake 94.66 ml  Output -  Net 94.66 ml   Filed Weights   12/15/18 0101  Weight: 98.4 kg   Exam  General: Well developed, chronically ill appearing, NAD, older than stated age  HEENT: NCAT,  mucous  membranes moist.   Cardiovascular: S1 S2 auscultated, RRR  Respiratory: Clear to auscultation bilaterally with equal chest rise  Abdomen: Soft, nontender, nondistended, + bowel sounds  Extremities: warm dry without cyanosis clubbing or edema  Neuro: AAOx3 (self, place, time- however not situation), needing constant redirectino. Nonfocal  Data Reviewed: I have personally reviewed following labs and imaging studies  CBC: Recent Labs  Lab 12/13/18 1534 12/14/18 0030 12/14/18 0454 12/15/18 0303  WBC 9.8 10.4 12.4* 13.3*  NEUTROABS 6.9  --   --   --   HGB 13.8 13.7 13.7 14.9  HCT 40.4 40.0 41.4 43.4  MCV 87.8 88.3 90.0 86.8  PLT 240 127* 225 960   Basic Metabolic Panel: Recent Labs  Lab 12/13/18 1534 12/14/18 0030 12/14/18 0454 12/15/18 0303  NA 138  --  139 139  K 3.8  --  4.0 3.6  CL 102  --  105 105  CO2 19*  --  20* 20*  GLUCOSE 116*  --  115* 98  BUN 19  --  17 17  CREATININE 1.09* 0.91 0.91 0.86  CALCIUM 9.9  --  9.5 9.5   GFR: Estimated Creatinine Clearance: 82.7 mL/min (by C-G formula based on SCr of 0.86 mg/dL). Liver Function Tests: Recent Labs  Lab 12/13/18 1534 12/14/18 0454  AST 45* 35  ALT 42 36  ALKPHOS 37* 31*  BILITOT 1.3* 0.9  PROT 7.9 7.1  ALBUMIN 4.0 3.7   No results for input(s): LIPASE, AMYLASE in the last 168 hours. Recent Labs  Lab 12/13/18 2048  AMMONIA 29   Coagulation Profile: No results for input(s): INR, PROTIME in the last 168 hours. Cardiac Enzymes: No results for input(s): CKTOTAL, CKMB, CKMBINDEX, TROPONINI in the last 168 hours. BNP (last 3 results) No results for input(s): PROBNP in the last 8760 hours. HbA1C: No results for input(s): HGBA1C in the last 72 hours. CBG: Recent Labs  Lab 12/13/18 2007  GLUCAP 95   Lipid Profile: No results for input(s): CHOL, HDL, LDLCALC, TRIG, CHOLHDL, LDLDIRECT in the last 72 hours. Thyroid Function Tests: No results for input(s): TSH, T4TOTAL, FREET4, T3FREE, THYROIDAB in  the last 72 hours. Anemia Panel: No results for input(s): VITAMINB12, FOLATE, FERRITIN, TIBC, IRON, RETICCTPCT in the last 72 hours. Urine analysis:    Component Value Date/Time   COLORURINE AMBER (A) 12/13/2018 2218   APPEARANCEUR HAZY (A) 12/13/2018 2218   LABSPEC 1.027 12/13/2018 2218   PHURINE 5.0 12/13/2018 2218   GLUCOSEU NEGATIVE 12/13/2018 2218   HGBUR SMALL (A) 12/13/2018 2218   HGBUR small 06/28/2009 1504   BILIRUBINUR SMALL (A) 12/13/2018 2218   KETONESUR 5 (A) 12/13/2018 2218   PROTEINUR 30 (A) 12/13/2018 2218   UROBILINOGEN 1.0 09/28/2014 1426   NITRITE NEGATIVE 12/13/2018 2218   LEUKOCYTESUR TRACE (A) 12/13/2018 2218   Sepsis Labs: @LABRCNTIP (procalcitonin:4,lacticidven:4)  ) Recent Results (from the past 240 hour(s))  Urine culture     Status: Abnormal   Collection Time: 12/13/18  9:00 PM   Specimen: Urine, Random  Result Value Ref Range  Status   Specimen Description URINE, RANDOM  Final   Special Requests   Final    ADDED [phone] 12/14/2018 Performed at [facility], [address], [place], [place] [address]    Culture MULTIPLE SPECIES PRESENT, SUGGEST RECOLLECTION (A)  Final   Report Status 12/15/2018 FINAL  Final  SARS CORONAVIRUS 2 (TAT 6-24 HRS) Nasopharyngeal     Status: None   Collection Time: 12/13/18  9:58 PM   Specimen: Nasopharyngeal  Result Value Ref Range Status   SARS Coronavirus 2 NEGATIVE NEGATIVE Final    Comment: (NOTE) SARS-CoV-2 target nucleic acids are NOT DETECTED. The SARS-CoV-2 RNA is generally detectable in upper and lower respiratory specimens during the acute phase of infection. Negative results do not preclude SARS-CoV-2 infection, do not rule out co-infections with other pathogens, and should not be used as the sole basis for treatment or other patient management decisions. Negative results must be combined with clinical observations, patient history, and epidemiological information. The expected result is Negative.  Fact Sheet for Patients: HairSlick.no Fact Sheet for Healthcare Providers: quierodirigir.com This test is not yet approved or cleared by the [place] FDA and  has been authorized for detection and/or diagnosis of SARS-CoV-2 by FDA under an Emergency Use Authorization (EUA). This EUA will remain  in effect (meaning this test can be used) for the duration of the COVID-19 declaration under Section 56 4(b)(1) of the Act, 21 U.S.C. section 360bbb-3(b)(1), unless the authorization is terminated or revoked sooner. Performed at [facility], [address] [place], [place] [address]       Radiology Studies: Ct Head Wo Contrast  Result Date: 12/13/2018 CLINICAL DATA:  Altered level of consciousness EXAM: CT HEAD WITHOUT CONTRAST TECHNIQUE: Contiguous axial images were obtained from the base of the skull through the vertex without intravenous contrast. COMPARISON:  12/02/2018 FINDINGS: Brain: Mild atrophic changes are noted stable from the prior exam. No acute hemorrhage, acute infarction or space-occupying mass lesion noted. Vascular: No hyperdense vessel or unexpected calcification. Skull: Normal. Negative for fracture or focal lesion. Sinuses/Orbits: No acute finding. Other: None. IMPRESSION: Mild atrophic changes without acute intracranial abnormality. No significant interval change from the prior exam. Electronically Signed   By: Alcide Clever M.D.   On: 12/13/2018 21:11   Dg Chest Port 1 View  Result Date: 12/14/2018 CLINICAL DATA:  Altered mental status EXAM: PORTABLE CHEST 1 VIEW COMPARISON:  12/02/2018 FINDINGS: The heart size and mediastinal contours are within normal limits. Both lungs are clear. The visualized skeletal structures are unremarkable. IMPRESSION: No active disease. Electronically Signed   By: Deatra Robinson M.D.   On: 12/14/2018 03:55     Scheduled Meds: . enoxaparin (LOVENOX) injection  40 mg Subcutaneous  Q24H  . nicotine  21 mg Transdermal Daily  . thiamine  100 mg Oral Daily   Continuous Infusions: . cefTRIAXone (ROCEPHIN)  IV 1 g (12/15/18 0047)  . dextrose 5 % and 0.9% NaCl 125 mL/hr at 12/14/18 0157     LOS: 2 days   Time Spent in minutes   30 minutes  Nirvana Blanchett D.O. on 12/15/2018 at 9:37 AM  Between 7am to 7pm - Please see pager noted on [email or web address]  After 7pm go to [email or web address]  And look for the night coverage person covering for me after hours  [facility] Office  [phone]

## 2018-12-15 NOTE — Evaluation (Signed)
Physical Therapy Evaluation Patient Details Name: Marie Woods MRN: [id number] DOB: 1955-11-19 Today's Date: 12/15/2018   History of Present Illness  Patient is a 63 year old female with medical history significant of asthma, hypertension, alcohol abuse who was brought in by EMS after neighbors called in stating that patient is confused and altered. Patient admitted Severe Sepsis with Acute metabolic encephalopathy secondary to UTI.   Clinical Impression  Pt admitted with above diagnosis. Pt A&O to self, following all simple commands. Ambulating room distances with walker and min assist. Displays decreased cognition, balance impairments, weakness. Prior to admission, pt lives in a second level apartment with a flight to enter and uses a cane for ambulation. Pt currently with functional limitations due to the deficits listed below (see PT Problem List). Pt will benefit from skilled PT to increase their independence and safety with mobility to allow discharge to the venue listed below.       Follow Up Recommendations SNF;Supervision/Assistance - 24 hour    Equipment Recommendations  Rolling walker with 5" wheels    Recommendations for Other Services       Precautions / Restrictions Precautions Precautions: Fall Precaution Comments: Contact precautions- bed bugs Restrictions Weight Bearing Restrictions: No      Mobility  Bed Mobility Overal bed mobility: Modified Independent                Transfers Overall transfer level: Needs assistance Equipment used: Rolling walker (2 wheeled) Transfers: Sit to/from Stand Sit to Stand: Min assist;From elevated surface         General transfer comment: patient requires verbal cues for safety/sequencing with rolling walker  Ambulation/Gait Ambulation/Gait assistance: Min assist;+2 safety/equipment Gait Distance (Feet): 30 Feet Assistive device: Rolling walker (2 wheeled) Gait Pattern/deviations: Step-through pattern;Decreased  stride length;Trunk flexed;Wide base of support Gait velocity: decreased   General Gait Details: Pt requiring min assist for stability and negotiating turns. Cues for walker proximity and keeping feet on inside of walker but pt unable to correct. Increased hip flexion  Stairs            Wheelchair Mobility    Modified Rankin (Stroke Patients Only)       Balance Overall balance assessment: Needs assistance Sitting-balance support: Feet supported Sitting balance-Leahy Scale: Good     Standing balance support: Bilateral upper extremity supported;During functional activity Standing balance-Leahy Scale: Poor                               Pertinent Vitals/Pain Pain Assessment: No/denies pain    Home Living Family/patient expects to be discharged to:: Private residence Living Arrangements: Alone Available Help at Discharge: Friend(s);Available PRN/intermittently Type of Home: Apartment Home Access: Stairs to enter Entrance Stairs-Rails: Right Entrance Stairs-Number of Steps: 12 Home Layout: One level Home Equipment: Cane - single point;Grab bars - tub/shower Additional Comments: Patient uses taxi service for transportation    Prior Function Level of Independence: Independent with assistive device(s)         Comments: patient reports using cane intermittently     Hand Dominance   Dominant Hand: Right    Extremity/Trunk Assessment   Upper Extremity Assessment Upper Extremity Assessment: Generalized weakness    Lower Extremity Assessment Lower Extremity Assessment: Generalized weakness    Cervical / Trunk Assessment Cervical / Trunk Assessment: Kyphotic  Communication   Communication: No difficulties  Cognition Arousal/Alertness: Awake/alert Behavior During Therapy: (tangential) Overall Cognitive Status: Impaired/Different from baseline  Area of Impairment: Following commands;Safety/judgement;Awareness;Attention;Memory                    Current Attention Level: Sustained Memory: Decreased short-term memory Following Commands: Follows one step commands inconsistently;Follows one step commands with increased time Safety/Judgement: Decreased awareness of safety Awareness: Emergent   General Comments: patient does not know month/year, knows shes in the hospital in [place]. perseverating on finding her phone      General Comments      Exercises     Assessment/Plan    PT Assessment Patient needs continued PT services  PT Problem List Decreased strength;Decreased activity tolerance;Decreased balance;Decreased mobility;Decreased cognition;Decreased safety awareness       PT Treatment Interventions DME instruction;Stair training;Gait training;Functional mobility training;Therapeutic activities;Balance training;Therapeutic exercise;Cognitive remediation;Patient/family education    PT Goals (Current goals can be found in the Care Plan section)  Acute Rehab PT Goals Patient Stated Goal: to find her phone PT Goal Formulation: With patient Time For Goal Achievement: 12/29/18 Potential to Achieve Goals: Good    Frequency Min 3X/week   Barriers to discharge        Co-evaluation PT/OT/SLP Co-Evaluation/Treatment: Yes Reason for Co-Treatment: For patient/therapist safety;To address functional/ADL transfers;Necessary to address cognition/behavior during functional activity PT goals addressed during session: Mobility/safety with mobility OT goals addressed during session: ADL's and self-care       AM-PAC PT "6 Clicks" Mobility  Outcome Measure Help needed turning from your back to your side while in a flat bed without using bedrails?: None Help needed moving from lying on your back to sitting on the side of a flat bed without using bedrails?: None Help needed moving to and from a bed to a chair (including a wheelchair)?: A Little Help needed standing up from a chair using your arms (e.g., wheelchair or bedside  chair)?: A Little Help needed to walk in hospital room?: A Little Help needed climbing 3-5 steps with a railing? : A Lot 6 Click Score: 19    End of Session Equipment Utilized During Treatment: Gait belt Activity Tolerance: Patient tolerated treatment well Patient left: in chair;with call bell/phone within reach;with chair alarm set Nurse Communication: Mobility status PT Visit Diagnosis: Other abnormalities of gait and mobility (R26.89);Muscle weakness (generalized) (M62.81);Difficulty in walking, not elsewhere classified (R26.2)    Time: 3785-8850 PT Time Calculation (min) (ACUTE ONLY): 20 min   Charges:   PT Evaluation $PT Eval Moderate Complexity: 1 Mod          Laurina Bustle, [place], DPT Acute Rehabilitation Services Pager [phone] Office [phone]   Vanetta Mulders 12/15/2018, 2:19 PM

## 2018-12-15 NOTE — Plan of Care (Signed)

## 2018-12-16 LAB — URINE CULTURE: Culture: <10000 — AB

## 2018-12-16 LAB — CBC
HCT: 40.8 % (ref 36.0–46.0)
Hemoglobin: 13.6 g/dL (ref 12.0–15.0)
MCH: 30 pg (ref 26.0–34.0)
MCHC: 33.3 g/dL (ref 30.0–36.0)
MCV: 89.9 fL (ref 80.0–100.0)
Platelets: 168 10*3/uL (ref 150–400)
RBC: 4.54 MIL/uL (ref 3.87–5.11)
RDW: 14.1 % (ref 11.5–15.5)
WBC: 9.5 10*3/uL (ref 4.0–10.5)
nRBC: 1 % — ABNORMAL HIGH (ref 0.0–0.2)

## 2018-12-16 MED ORDER — THIAMINE HCL 100 MG/ML IJ SOLN
500.0000 mg | Freq: Three times a day (TID) | INTRAVENOUS | Status: AC
  Administered 2018-12-16 – 2018-12-18 (×9): 500 mg via INTRAVENOUS
  Filled 2018-12-16 (×6): qty 5
  Filled 2018-12-16: qty 4
  Filled 2018-12-16 (×2): qty 5

## 2018-12-16 NOTE — Plan of Care (Signed)

## 2018-12-16 NOTE — Plan of Care (Signed)

## 2018-12-16 NOTE — Progress Notes (Signed)
PROGRESS NOTE    Marie Woods  BJY:[id number] DOB: [birth year] DOA: 12/13/2018 PCP: Marcine Matar, MD   Brief Narrative:  HPI On 12/13/2018 by Dr. Earlie Lou Marie Woods is a 63 y.o. female with medical history significant of asthma, hypertension, alcohol abuse who was brought in by EMS after neighbors called in stating that patient is confused and altered.  She is currently disoriented and not able to give coherent history.  Most history therefore was obtained from the neighbors who assisted EMS.  Has been sick gradually over a month.  Apparently.  She has however been coherent able to communicate without any known problems.  Today however she is not herself.  She told me she thinks she is somewhere in the house not in the hospital.  She is not tremulous and she is not aggressive.  Her alcohol level was negative.  No reported drug use.  Patient is being admitted with altered mental status for work-up.  So far no UTI or pneumonia identified..  Interim history  Patient admitted with acute metabolic encephalopathy thought to be secondary to UTI.  Urine culture showed multiple species, will recollect.  Patient continues to have some confusion, MRI brain pending. Assessment & Plan   Severe Sepsis with Acute metabolic encephalopathy secondary to UTI/Wernicke's encephalopathy -On presentation, patient noted to be tachycardic with tachypnea in the emergency department.  Now patient with leukocytosis -CT head mild atrophic changes without acute intracranial abnormality -Chest x-ray unremarkable for infection -UA rare bacteria, 11-20 WBC, trace leukocytes -Urine culture showed multiple species, repeat culture pending -Continue ceftriaxone, IV fluids -Status slowly improving, patient is alert and oriented to person, place, and time, however not situation.  Difficult to keep patient focused and needing to be reoriented frequently. -There was question of patient having alcohol abuse however  alcohol level less than 10.  Urine drug screen negative -MRI brain: Symmetric diffusion weighted and T2/FLAIR hyperintense signal abnormality within the medial thalami.  Findings favor sequelae of Wernicke's encephalopathy -Started patient on high-dose thiamine -Discussed with neurology, Dr. Laurence Slate, recommended thiamine 500 mg 3 times daily for 3 days then followed by 100 mg daily. -Of note vitamin B 1 level ordered however patient has been on thiamine since admission, therefore level may not be accurate.  Essential Hypertension -Amlodipine currently held, BP stable  History of alcohol abuse -As above, alcohol level less than 10 -Continue folate, thiamine, multivitamin -Monitor for withdrawal- continue CIWA protocol  History of tobacco abuse -Continue nicotine patch  Bed bug/ Deconditioning -bed bug found on patient, continue contact precautions -PT/OT-recommended SNF -SW/CM consulted  DVT Prophylaxis  Lovenox  Code Status: Full  Family Communication: None at bedside  Disposition Plan: Admitted. Pending improvement in mental status. Dispo likely SNF  Consultants Neurology via phone, Dr. Laurence Slate  Procedures  None  Antibiotics   Anti-infectives (From admission, onward)   Start     Dose/Rate Route Frequency Ordered Stop   12/14/18 0100  cefTRIAXone (ROCEPHIN) 1 g in sodium chloride 0.9 % 100 mL IVPB     1 g 200 mL/hr over 30 Minutes Intravenous Every 24 hours 12/14/18 0010        Subjective:   Marie Woods seen and examined today.  Patient with no complaints this morning.  Able to have a conversation however it needs to be oriented frequently.  Continues to look for a wallet and keys.  Denies chest pain or shortness of breath, abdominal pain, nausea or vomiting. Objective:   Vitals:  12/15/18 0648 12/15/18 1407 12/15/18 2314 12/16/18 0628  BP: 113/85 (!) 128/99 124/85 116/82  Pulse: 81 98 96 84  Resp:  16    Temp: 98.1 F (36.7 C) 97.6 F (36.4 C) (!) 97.4 F (36.3  C) 97.6 F (36.4 C)  TempSrc: Oral Oral  Oral  SpO2: 100% 100% 100% 99%  Weight:      Height:        Intake/Output Summary (Last 24 hours) at 12/16/2018 0940 Last data filed at 12/15/2018 2309 Gross per 24 hour  Intake 1170.21 ml  Output 100 ml  Net 1070.21 ml   Filed Weights   12/15/18 0101  Weight: 98.4 kg   Exam  General: Well developed, chronically ill-appearing, NAD, appears stated age  HEENT: NCAT, mucous membranes moist.   Cardiovascular: S1 S2 auscultated, RRR, no murmur appreciated  Respiratory: Clear to auscultation bilaterally  Abdomen: Soft, nontender, nondistended, + bowel sounds  Extremities: warm dry without cyanosis clubbing or edema  Neuro: AAOx3 (self, place, time however not situation), needs constant redirection.  Nonfocal.  Data Reviewed: I have personally reviewed following labs and imaging studies  CBC: Recent Labs  Lab 12/13/18 1534 12/14/18 0030 12/14/18 0454 12/15/18 0303 12/16/18 0631  WBC 9.8 10.4 12.4* 13.3* 9.5  NEUTROABS 6.9  --   --   --   --   HGB 13.8 13.7 13.7 14.9 13.6  HCT 40.4 40.0 41.4 43.4 40.8  MCV 87.8 88.3 90.0 86.8 89.9  PLT 240 127* 225 247 161   Basic Metabolic Panel: Recent Labs  Lab 12/13/18 1534 12/14/18 0030 12/14/18 0454 12/15/18 0303 12/15/18 1009  NA 138  --  139 139  --   K 3.8  --  4.0 3.6  --   CL 102  --  105 105  --   CO2 19*  --  20* 20*  --   GLUCOSE 116*  --  115* 98  --   BUN 19  --  17 17  --   CREATININE 1.09* 0.91 0.91 0.86  --   CALCIUM 9.9  --  9.5 9.5  --   MG  --   --   --   --  2.4  PHOS  --   --   --   --  4.1   GFR: Estimated Creatinine Clearance: 82.7 mL/min (by C-G formula based on SCr of 0.86 mg/dL). Liver Function Tests: Recent Labs  Lab 12/13/18 1534 12/14/18 0454  AST 45* 35  ALT 42 36  ALKPHOS 37* 31*  BILITOT 1.3* 0.9  PROT 7.9 7.1  ALBUMIN 4.0 3.7   No results for input(s): LIPASE, AMYLASE in the last 168 hours. Recent Labs  Lab 12/13/18 2048    AMMONIA 29   Coagulation Profile: No results for input(s): INR, PROTIME in the last 168 hours. Cardiac Enzymes: No results for input(s): CKTOTAL, CKMB, CKMBINDEX, TROPONINI in the last 168 hours. BNP (last 3 results) No results for input(s): PROBNP in the last 8760 hours. HbA1C: No results for input(s): HGBA1C in the last 72 hours. CBG: Recent Labs  Lab 12/13/18 2007  GLUCAP 95   Lipid Profile: No results for input(s): CHOL, HDL, LDLCALC, TRIG, CHOLHDL, LDLDIRECT in the last 72 hours. Thyroid Function Tests: No results for input(s): TSH, T4TOTAL, FREET4, T3FREE, THYROIDAB in the last 72 hours. Anemia Panel: No results for input(s): VITAMINB12, FOLATE, FERRITIN, TIBC, IRON, RETICCTPCT in the last 72 hours. Urine analysis:    Component Value Date/Time   COLORURINE  AMBER (A) 12/13/2018 2218   APPEARANCEUR HAZY (A) 12/13/2018 2218   LABSPEC 1.027 12/13/2018 2218   PHURINE 5.0 12/13/2018 2218   GLUCOSEU NEGATIVE 12/13/2018 2218   HGBUR SMALL (A) 12/13/2018 2218   HGBUR small 06/28/2009 1504   BILIRUBINUR SMALL (A) 12/13/2018 2218   KETONESUR 5 (A) 12/13/2018 2218   PROTEINUR 30 (A) 12/13/2018 2218   UROBILINOGEN 1.0 09/28/2014 1426   NITRITE NEGATIVE 12/13/2018 2218   LEUKOCYTESUR TRACE (A) 12/13/2018 2218   Sepsis Labs: (procalcitonin:4,lacticidven:4)  ) Recent Results (from the past 240 hour(s))  Urine culture     Status: Abnormal   Collection Time: 12/13/18  9:00 PM   Specimen: Urine, Random  Result Value Ref Range Status   Specimen Description URINE, RANDOM  Final   Special Requests   Final    ADDED 0444 12/14/2018 Performed at [facility], [address], [place], [place] [address]    Culture MULTIPLE SPECIES PRESENT, SUGGEST RECOLLECTION (A)  Final   Report Status 12/15/2018 FINAL  Final  SARS CORONAVIRUS 2 (TAT 6-24 HRS) Nasopharyngeal     Status: None   Collection Time: 12/13/18  9:58 PM   Specimen: Nasopharyngeal  Result Value Ref Range  Status   SARS Coronavirus 2 NEGATIVE NEGATIVE Final    Comment: (NOTE) SARS-CoV-2 target nucleic acids are NOT DETECTED. The SARS-CoV-2 RNA is generally detectable in upper and lower respiratory specimens during the acute phase of infection. Negative results do not preclude SARS-CoV-2 infection, do not rule out co-infections with other pathogens, and should not be used as the sole basis for treatment or other patient management decisions. Negative results must be combined with clinical observations, patient history, and epidemiological information. The expected result is Negative. Fact Sheet for Patients: HairSlick.no Fact Sheet for Healthcare Providers: quierodirigir.com This test is not yet approved or cleared by the [place] FDA and  has been authorized for detection and/or diagnosis of SARS-CoV-2 by FDA under an Emergency Use Authorization (EUA). This EUA will remain  in effect (meaning this test can be used) for the duration of the COVID-19 declaration under Section 56 4(b)(1) of the Act, 21 U.S.C. section 360bbb-3(b)(1), unless the authorization is terminated or revoked sooner. Performed at [facility], [address], [place], [place] [address]       Radiology Studies: Dg Abd 1 View  Result Date: 12/15/2018 CLINICAL DATA:  Encounter for imaging to screen for metal prior magnetic resonance imaging. EXAM: ABDOMEN - 1 VIEW COMPARISON:  None. FINDINGS: There is no evidence of metal in the abdomen or pelvis. The bowel gas pattern is normal. No acute bone abnormality. IMPRESSION: Benign-appearing abdomen. Specifically, no metal in the abdomen or pelvis. Electronically Signed   By: Francene Boyers M.D.   On: 12/15/2018 09:39   Mr Brain Wo Contrast (neuro Protocol)  Result Date: 12/15/2018 CLINICAL DATA:  Altered level of consciousness (LOC), unexplained. Additional history provided: History of alcohol abuse EXAM:  MRI HEAD WITHOUT CONTRAST TECHNIQUE: Multiplanar, multiecho pulse sequences of the brain and surrounding structures were obtained without intravenous contrast. COMPARISON:  Head CT 12/13/2018 FINDINGS: Brain: Multiple sequences are significantly motion degraded, limiting evaluation. Most notably there is moderate motion degradation of the axial SWI sequence and moderate/severe motion degradation of the axial T1 and sagittal T1 FLAIR sequences. The examination was prematurely terminated and a coronal T2 weighted sequence could not be acquired. There is symmetric diffusion weighted hyperintensity and T2/FLAIR hyperintensity within the medial thalami. No diffusion weighted signal  abnormality elsewhere within the brain. There is a T2 intermediate extra-axial lesion at midline insinuating between the anteroinferior frontal lobes, measuring 1.7 x 1.7 cm in transaxial dimensions (series 5, image 12). 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 No midline shift or extra-axial fluid collection. No chronic intracranial blood products identified on motion degraded SWI imaging. Mild generalized parenchymal atrophy. Vascular: Flow voids maintained within the proximal large arterial vessels. Skull and upper cervical spine: No focal marrow lesion identified within described limitations. Sinuses/Orbits: Visualized orbits demonstrate no acute abnormality. Minimal scattered paranasal sinus mucosal thickening. No significant mastoid effusion. These results were called by telephone at the time of interpretation on 12/15/2018 at 1:28 pm to provider Dr. Catha GosselinMikhail, who verbally acknowledged these results. IMPRESSION: 1. Motion degraded and prematurely terminated examination as described. 2. Symmetric diffusion-weighted and T2/FLAIR hyperintense signal abnormality within the medial thalami. Given the provided history of alcohol abuse, findings are favored to reflect sequela of Wernicke is encephalopathy. 3. Extra-axial mass  insinuating between the anteroinferior frontal lobes measuring 1.7 x 1.7 cm in transaxial dimensions. 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 However, contrast-enhanced MRI imaging is recommended for further evaluation when clinically feasible. Electronically Signed   By: Jackey LogeKyle  Golden DO   On: 12/15/2018 13:31     Scheduled Meds:  enoxaparin (LOVENOX) injection  40 mg Subcutaneous Q24H   folic acid  1 mg Oral Daily   multivitamin with minerals  1 tablet Oral Daily   nicotine  21 mg Transdermal Daily   Continuous Infusions:  cefTRIAXone (ROCEPHIN)  IV Stopped (12/16/18 0916)   dextrose 5 % and 0.9% NaCl Stopped (12/15/18 0700)   thiamine injection       LOS: 3 days   Time Spent in minutes   45 minutes (greater than 50% of time spent with patient face to face, as well as reviewing records, calling consults, and formulating a plan)   Edsel PetrinMaryann Keiden Deskin D.O. on 12/16/2018 at 9:40 AM  Between 7am to 7pm - Please see pager noted on [email or web address]  After 7pm go to [email or web address]  And look for the night coverage person covering for me after hours  [facility] Office  [phone]

## 2018-12-17 MED ORDER — SODIUM CHLORIDE 0.9 % IV SOLN
INTRAVENOUS | Status: DC
  Administered 2018-12-17 – 2018-12-21 (×8): via INTRAVENOUS

## 2018-12-17 MED ORDER — PHENOL 1.4 % MT LIQD
1.0000 | OROMUCOSAL | Status: DC | PRN
  Administered 2018-12-17 – 2019-05-08 (×17): 1 via OROMUCOSAL
  Filled 2018-12-17 (×14): qty 177

## 2018-12-17 NOTE — Progress Notes (Addendum)
CSW notes patient not fully oriented. CSW lvm with patient's emergency contact Silva Bandy to inform of SNF rec and initiate discharge planning. CSW awaiting call back for approval to fax referrals to SNF placements.   Potential barriers to SNF placement include lack of insurance and substance abuse hx.   CSW will continue to follow for discharge planning.   [place], [place]

## 2018-12-17 NOTE — Plan of Care (Signed)

## 2018-12-17 NOTE — Progress Notes (Signed)
PROGRESS NOTE    Marie Woods  EUM:[id number] DOB: [birth year] DOA: 12/13/2018 PCP: Marcine Matar, MD   Brief Narrative:  HPI On 12/13/2018 by Dr. Earlie Lou Marie Woods is a 63 y.o. female with medical history significant of asthma, hypertension, alcohol abuse who was brought in by EMS after neighbors called in stating that patient is confused and altered.  She is currently disoriented and not able to give coherent history.  Most history therefore was obtained from the neighbors who assisted EMS.  Has been sick gradually over a month.  Apparently.  She has however been coherent able to communicate without any known problems.  Today however she is not herself.  She told me she thinks she is somewhere in the house not in the hospital.  She is not tremulous and she is not aggressive.  Her alcohol level was negative.  No reported drug use.  Patient is being admitted with altered mental status for work-up.  So far no UTI or pneumonia identified..  Interim history  Patient admitted with acute metabolic encephalopathy thought to be secondary to UTI.  Urine culture showed multiple species, will recollect.  Patient continues to have some confusion, MRI brain showed Wernicke's. Assessment & Plan   Severe Sepsis with Acute metabolic encephalopathy secondary to UTI/Wernicke's encephalopathy -On presentation, patient noted to be tachycardic with tachypnea in the emergency department.  Now patient with leukocytosis -CT head mild atrophic changes without acute intracranial abnormality -Chest x-ray unremarkable for infection -UA rare bacteria, 11-20 WBC, trace leukocytes -Urine culture showed multiple species, repeat culture showed <10K colonies  -Was initially placed on ceftriaxone, IV fluids- will discontinue ceftriaxone  -Status slowly improving, patient is alert and oriented to person, place, and time, however not situation.  Difficult to keep patient focused and needing to be reoriented  frequently. -There was question of patient having alcohol abuse however alcohol level less than 10.  Urine drug screen negative -MRI brain: Symmetric diffusion weighted and T2/FLAIR hyperintense signal abnormality within the medial thalami.  Findings favor sequelae of Wernicke's encephalopathy -Started patient on high-dose thiamine -Discussed with neurology on 12/16/2018, Dr. Laurence Slate, recommended thiamine 500 mg 3 times daily for 3 days then followed by 100 mg daily. -Of note vitamin B 1 level ordered however patient has been on thiamine since admission, therefore level may not be accurate.  Essential Hypertension -Amlodipine currently held, BP stable  History of alcohol abuse -As above, alcohol level less than 10 -Continue folate, thiamine, multivitamin -Monitor for withdrawal- continue CIWA protocol  History of tobacco abuse -Continue nicotine patch  Bed bug/ Deconditioning -bed bug found on patient, continue contact precautions -PT/OT-recommended SNF -SW/CM consulted  DVT Prophylaxis  Lovenox  Code Status: Full  Family Communication: None at bedside  Disposition Plan: Admitted. Pending improvement in mental status. Dispo likely SNF  Consultants Neurology via phone, Dr. Laurence Slate  Procedures  None  Antibiotics   Anti-infectives (From admission, onward)   Start     Dose/Rate Route Frequency Ordered Stop   12/14/18 0100  cefTRIAXone (ROCEPHIN) 1 g in sodium chloride 0.9 % 100 mL IVPB     1 g 200 mL/hr over 30 Minutes Intravenous Every 24 hours 12/14/18 0010        Subjective:   Marie Woods seen and examined today.  Patient with no complaints this morning.  Denies current chest pain, shortness of breath, abdominal pain, nausea vomiting, diarrhea or constipation. Objective:   Vitals:   12/15/18 2314 12/16/18 0628 12/16/18 1434  12/17/18 0450  BP: 124/85 116/82 (!) 119/91 109/75  Pulse: 96 84 82 75  Resp:   18 16  Temp: (!) 97.4 F (36.3 C) 97.6 F (36.4 C) (!) 97.3  F (36.3 C) 98.3 F (36.8 C)  TempSrc:  Oral Oral Oral  SpO2: 100% 99% 98% 94%  Weight:      Height:        Intake/Output Summary (Last 24 hours) at 12/17/2018 0939 Last data filed at 12/17/2018 1610 Gross per 24 hour  Intake 1113.28 ml  Output 300 ml  Net 813.28 ml   Filed Weights   12/15/18 0101  Weight: 98.4 kg   Exam  General: Well developed, chronically ill appearing, NAD  HEENT: NCAT, mucous membranes moist.   Cardiovascular: S1 S2 auscultated, RRR, no murmur  Respiratory: Clear to auscultation bilaterally   Abdomen: Soft, nontender, nondistended, + bowel sounds  Extremities: warm dry without cyanosis clubbing or edema  Neuro: AAOx3 (self, place, time, but not situation), nonfocal  Psych: appropriate   Data Reviewed: I have personally reviewed following labs and imaging studies  CBC: Recent Labs  Lab 12/13/18 1534 12/14/18 0030 12/14/18 0454 12/15/18 0303 12/16/18 0631  WBC 9.8 10.4 12.4* 13.3* 9.5  NEUTROABS 6.9  --   --   --   --   HGB 13.8 13.7 13.7 14.9 13.6  HCT 40.4 40.0 41.4 43.4 40.8  MCV 87.8 88.3 90.0 86.8 89.9  PLT 240 127* 225 247 168   Basic Metabolic Panel: Recent Labs  Lab 12/13/18 1534 12/14/18 0030 12/14/18 0454 12/15/18 0303 12/15/18 1009  NA 138  --  139 139  --   K 3.8  --  4.0 3.6  --   CL 102  --  105 105  --   CO2 19*  --  20* 20*  --   GLUCOSE 116*  --  115* 98  --   BUN 19  --  17 17  --   CREATININE 1.09* 0.91 0.91 0.86  --   CALCIUM 9.9  --  9.5 9.5  --   MG  --   --   --   --  2.4  PHOS  --   --   --   --  4.1   GFR: Estimated Creatinine Clearance: 82.7 mL/min (by C-G formula based on SCr of 0.86 mg/dL). Liver Function Tests: Recent Labs  Lab 12/13/18 1534 12/14/18 0454  AST 45* 35  ALT 42 36  ALKPHOS 37* 31*  BILITOT 1.3* 0.9  PROT 7.9 7.1  ALBUMIN 4.0 3.7   No results for input(s): LIPASE, AMYLASE in the last 168 hours. Recent Labs  Lab 12/13/18 2048  AMMONIA 29   Coagulation Profile:  No results for input(s): INR, PROTIME in the last 168 hours. Cardiac Enzymes: No results for input(s): CKTOTAL, CKMB, CKMBINDEX, TROPONINI in the last 168 hours. BNP (last 3 results) No results for input(s): PROBNP in the last 8760 hours. HbA1C: No results for input(s): HGBA1C in the last 72 hours. CBG: Recent Labs  Lab 12/13/18 2007  GLUCAP 95   Lipid Profile: No results for input(s): CHOL, HDL, LDLCALC, TRIG, CHOLHDL, LDLDIRECT in the last 72 hours. Thyroid Function Tests: No results for input(s): TSH, T4TOTAL, FREET4, T3FREE, THYROIDAB in the last 72 hours. Anemia Panel: No results for input(s): VITAMINB12, FOLATE, FERRITIN, TIBC, IRON, RETICCTPCT in the last 72 hours. Urine analysis:    Component Value Date/Time   COLORURINE AMBER (A) 12/13/2018 2218   APPEARANCEUR HAZY (  A) 12/13/2018 2218   LABSPEC 1.027 12/13/2018 2218   PHURINE 5.0 12/13/2018 2218   GLUCOSEU NEGATIVE 12/13/2018 2218   HGBUR SMALL (A) 12/13/2018 2218   HGBUR small 06/28/2009 1504   BILIRUBINUR SMALL (A) 12/13/2018 2218   KETONESUR 5 (A) 12/13/2018 2218   PROTEINUR 30 (A) 12/13/2018 2218   UROBILINOGEN 1.0 09/28/2014 1426   NITRITE NEGATIVE 12/13/2018 2218   LEUKOCYTESUR TRACE (A) 12/13/2018 2218   Sepsis Labs: @LABRCNTIP (procalcitonin:4,lacticidven:4)  ) Recent Results (from the past 240 hour(s))  Urine culture     Status: Abnormal   Collection Time: 12/13/18  9:00 PM   Specimen: Urine, Random  Result Value Ref Range Status   Specimen Description URINE, RANDOM  Final   Special Requests   Final    ADDED [id number] 12/14/2018 Performed at [facility], [place] [address] [place], [place] [address]    Culture MULTIPLE SPECIES PRESENT, SUGGEST RECOLLECTION (A)  Final   Report Status 12/15/2018 FINAL  Final  SARS CORONAVIRUS 2 (TAT 6-24 HRS) Nasopharyngeal     Status: None   Collection Time: 12/13/18  9:58 PM   Specimen: Nasopharyngeal  Result Value Ref Range Status   SARS Coronavirus 2 NEGATIVE  NEGATIVE Final    Comment: (NOTE) SARS-CoV-2 target nucleic acids are NOT DETECTED. The SARS-CoV-2 RNA is generally detectable in upper and lower respiratory specimens during the acute phase of infection. Negative results do not preclude SARS-CoV-2 infection, do not rule out co-infections with other pathogens, and should not be used as the sole basis for treatment or other patient management decisions. Negative results must be combined with clinical observations, patient history, and epidemiological information. The expected result is Negative. Fact Sheet for Patients: SugarRoll.be Fact Sheet for Healthcare Providers: https://www.woods-mathews.com/ This test is not yet approved or cleared by the [place] FDA and  has been authorized for detection and/or diagnosis of SARS-CoV-2 by FDA under an Emergency Use Authorization (EUA). This EUA will remain  in effect (meaning this test can be used) for the duration of the COVID-19 declaration under Section 56 4(b)(1) of the Act, 21 U.S.C. section 360bbb-3(b)(1), unless the authorization is terminated or revoked sooner. Performed at [facility], [place] [address], [place], [place] [address]   Culture, Urine     Status: Abnormal   Collection Time: 12/15/18  3:10 PM   Specimen: Urine, Clean Catch  Result Value Ref Range Status   Specimen Description URINE, CLEAN CATCH  Final   Special Requests NONE  Final   Culture (A)  Final    <10,000 COLONIES/mL INSIGNIFICANT GROWTH Performed at [facility], [place] [address] [place], [place] [address]    Report Status 12/16/2018 FINAL  Final      Radiology Studies: Mr Brain Wo Contrast (neuro Protocol)  Result Date: 12/15/2018 CLINICAL DATA:  Altered level of consciousness (LOC), unexplained. Additional history provided: History of alcohol abuse EXAM: MRI HEAD WITHOUT CONTRAST TECHNIQUE: Multiplanar, multiecho pulse sequences of the brain and  surrounding structures were obtained without intravenous contrast. COMPARISON:  Head CT 12/13/2018 FINDINGS: Brain: Multiple sequences are significantly motion degraded, limiting evaluation. Most notably there is moderate motion degradation of the axial SWI sequence and moderate/severe motion degradation of the axial T1 and sagittal T1 FLAIR sequences. The examination was prematurely terminated and a coronal T2 weighted sequence could not be acquired. There is symmetric diffusion weighted hyperintensity and T2/FLAIR hyperintensity within the medial thalami. No diffusion weighted signal abnormality elsewhere within the brain. There is  a T2 intermediate extra-axial lesion at midline insinuating between the anteroinferior frontal lobes, measuring 1.7 x 1.7 cm in transaxial dimensions (series 5, image 12). 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 No midline shift or extra-axial fluid collection. No chronic intracranial blood products identified on motion degraded SWI imaging. Mild generalized parenchymal atrophy. Vascular: Flow voids maintained within the proximal large arterial vessels. Skull and upper cervical spine: No focal marrow lesion identified within described limitations. Sinuses/Orbits: Visualized orbits demonstrate no acute abnormality. Minimal scattered paranasal sinus mucosal thickening. No significant mastoid effusion. These results were called by telephone at the time of interpretation on 12/15/2018 at 1:28 pm to provider Dr. Catha GosselinMikhail, who verbally acknowledged these results. IMPRESSION: 1. Motion degraded and prematurely terminated examination as described. 2. Symmetric diffusion-weighted and T2/FLAIR hyperintense signal abnormality within the medial thalami. Given the provided history of alcohol abuse, findings are favored to reflect sequela of Wernicke is encephalopathy. 3. Extra-axial mass insinuating between the anteroinferior frontal lobes measuring 1.7 x 1.7 cm in transaxial  dimensions. 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 However, contrast-enhanced MRI imaging is recommended for further evaluation when clinically feasible. Electronically Signed   By: Jackey LogeKyle  Golden DO   On: 12/15/2018 13:31     Scheduled Meds: . enoxaparin (LOVENOX) injection  40 mg Subcutaneous Q24H  . folic acid  1 mg Oral Daily  . multivitamin with minerals  1 tablet Oral Daily  . nicotine  21 mg Transdermal Daily   Continuous Infusions: . sodium chloride 100 mL/hr at 12/17/18 0733  . cefTRIAXone (ROCEPHIN)  IV Stopped (12/17/18 0857)  . thiamine injection Stopped (12/17/18 0600)     LOS: 4 days   Time Spent in minutes   30 minutes  Priya Matsen D.O. on 12/17/2018 at 9:39 AM  Between 7am to 7pm - Please see pager noted on [email or web address]  After 7pm go to [email or web address]  And look for the night coverage person covering for me after hours  [facility] Office  [phone]

## 2018-12-18 MED ORDER — LORAZEPAM 2 MG/ML IJ SOLN
INTRAMUSCULAR | Status: AC
  Administered 2018-12-18: 1 mg
  Filled 2018-12-18: qty 1

## 2018-12-18 MED ORDER — LORAZEPAM 2 MG/ML IJ SOLN
1.0000 mg | Freq: Once | INTRAMUSCULAR | Status: DC
  Filled 2018-12-18: qty 1

## 2018-12-18 NOTE — Progress Notes (Signed)
PROGRESS NOTE    IVONNA Woods  NKN:[id number] DOB: 1955-11-23 DOA: 12/13/2018 PCP: Ladell Pier, MD   Brief Narrative:  HPI On 12/13/2018 by Dr. Gala Romney Marie Woods is a 63 y.o. female with medical history significant of asthma, hypertension, alcohol abuse who was brought in by EMS after neighbors called in stating that patient is confused and altered.  She is currently disoriented and not able to give coherent history.  Most history therefore was obtained from the neighbors who assisted EMS.  Has been sick gradually over a month.  Apparently.  She has however been coherent able to communicate without any known problems.  Today however she is not herself.  She told me she thinks she is somewhere in the house not in the hospital.  She is not tremulous and she is not aggressive.  Her alcohol level was negative.  No reported drug use.  Patient is being admitted with altered mental status for work-up.  So far no UTI or pneumonia identified..  Interim history  Patient admitted with acute metabolic encephalopathy thought to be secondary to UTI.  Urine culture showed multiple species, will recollect.  Patient continues to have some confusion, MRI brain showed Wernicke's. Assessment & Plan   Severe Sepsis with Acute metabolic encephalopathy secondary to UTI/Wernicke's encephalopathy -On presentation, patient noted to be tachycardic with tachypnea in the emergency department.  Now patient with leukocytosis -CT head mild atrophic changes without acute intracranial abnormality -Chest x-ray unremarkable for infection -UA rare bacteria, 11-20 WBC, trace leukocytes -Urine culture showed multiple species, repeat culture showed <10K colonies  -Was initially placed on ceftriaxone, IV fluids- will discontinue ceftriaxone  -Status slowly improving, patient is alert and oriented to person, place, and time, however not situation.  Difficult to keep patient focused and needing to be reoriented  frequently. -There was question of patient having alcohol abuse however alcohol level less than 10.  Urine drug screen negative -MRI brain: Symmetric diffusion weighted and T2/FLAIR hyperintense signal abnormality within the medial thalami.  Findings favor sequelae of Wernicke's encephalopathy -Started patient on high-dose thiamine -Discussed with neurology on 12/16/2018, Dr. Lorraine Lax, recommended thiamine 500 mg 3 times daily for 3 days then followed by 100 mg daily. -Of note vitamin B 1 level ordered however patient has been on thiamine since admission, therefore level may not be accurate.  Essential Hypertension -Amlodipine currently held, BP stable  History of alcohol abuse -As above, alcohol level less than 10 -Continue folate, thiamine, multivitamin -Monitor for withdrawal- continue CIWA protocol  History of tobacco abuse -Continue nicotine patch  Bed bug/ Deconditioning -bed bug found on patient, continue contact precautions -PT/OT-recommended SNF -SW/CM consulted  DVT Prophylaxis  Lovenox  Code Status: Full  Family Communication: None at bedside  Disposition Plan: Admitted. Pending improvement in mental status. Dispo likely SNF- however given no insurance and alcohol history- she may be a difficult placement  Consultants Neurology via phone, Dr. Lorraine Lax  Procedures  None  Antibiotics   Anti-infectives (From admission, onward)   Start     Dose/Rate Route Frequency Ordered Stop   12/14/18 0100  cefTRIAXone (ROCEPHIN) 1 g in sodium chloride 0.9 % 100 mL IVPB  Status:  Discontinued     1 g 200 mL/hr over 30 Minutes Intravenous Every 24 hours 12/14/18 0010 12/17/18 3790      Subjective:   Jakyrah Holladay seen and examined today.  Patient with no complaints this morning.  Denies chest pain, shortness of breath, dental pain, nausea  or vomiting, diarrhea constipation. Objective:   Vitals:   12/17/18 1504 12/17/18 1508 12/17/18 2112 12/18/18 0431  BP: 93/68 105/69 102/65  (!) 136/99  Pulse: 87  85 81  Resp: 18  18 16   Temp: 98.4 F (36.9 C)  97.6 F (36.4 C) 97.8 F (36.6 C)  TempSrc: Oral  Oral Oral  SpO2: 98%  100% 100%  Weight:      Height:        Intake/Output Summary (Last 24 hours) at 12/18/2018 1012 Last data filed at 12/18/2018 0900 Gross per 24 hour  Intake 3370.81 ml  Output -  Net 3370.81 ml   Filed Weights   12/15/18 0101  Weight: 98.4 kg   Exam  General: Well developed, chronically ill-appearing, NAD  HEENT: NCAT, mucous membranes moist.   Cardiovascular: S1 S2 auscultated, RRR, no murmur  Respiratory: Clear to auscultation bilaterally   Abdomen: Soft, nontender, nondistended, + bowel sounds  Extremities: warm dry without cyanosis clubbing or edema  Neuro: AAOx3, (self, place, time, but not situation), nonfocal  Psych: Appropriate mood and affect  Data Reviewed: I have personally reviewed following labs and imaging studies  CBC: Recent Labs  Lab 12/13/18 1534 12/14/18 0030 12/14/18 0454 12/15/18 0303 12/16/18 0631  WBC 9.8 10.4 12.4* 13.3* 9.5  NEUTROABS 6.9  --   --   --   --   HGB 13.8 13.7 13.7 14.9 13.6  HCT 40.4 40.0 41.4 43.4 40.8  MCV 87.8 88.3 90.0 86.8 89.9  PLT 240 127* 225 247 168   Basic Metabolic Panel: Recent Labs  Lab 12/13/18 1534 12/14/18 0030 12/14/18 0454 12/15/18 0303 12/15/18 1009  NA 138  --  139 139  --   K 3.8  --  4.0 3.6  --   CL 102  --  105 105  --   CO2 19*  --  20* 20*  --   GLUCOSE 116*  --  115* 98  --   BUN 19  --  17 17  --   CREATININE 1.09* 0.91 0.91 0.86  --   CALCIUM 9.9  --  9.5 9.5  --   MG  --   --   --   --  2.4  PHOS  --   --   --   --  4.1   GFR: Estimated Creatinine Clearance: 82.7 mL/min (by C-G formula based on SCr of 0.86 mg/dL). Liver Function Tests: Recent Labs  Lab 12/13/18 1534 12/14/18 0454  AST 45* 35  ALT 42 36  ALKPHOS 37* 31*  BILITOT 1.3* 0.9  PROT 7.9 7.1  ALBUMIN 4.0 3.7   No results for input(s): LIPASE, AMYLASE in the  last 168 hours. Recent Labs  Lab 12/13/18 2048  AMMONIA 29   Coagulation Profile: No results for input(s): INR, PROTIME in the last 168 hours. Cardiac Enzymes: No results for input(s): CKTOTAL, CKMB, CKMBINDEX, TROPONINI in the last 168 hours. BNP (last 3 results) No results for input(s): PROBNP in the last 8760 hours. HbA1C: No results for input(s): HGBA1C in the last 72 hours. CBG: Recent Labs  Lab 12/13/18 2007  GLUCAP 95   Lipid Profile: No results for input(s): CHOL, HDL, LDLCALC, TRIG, CHOLHDL, LDLDIRECT in the last 72 hours. Thyroid Function Tests: No results for input(s): TSH, T4TOTAL, FREET4, T3FREE, THYROIDAB in the last 72 hours. Anemia Panel: No results for input(s): VITAMINB12, FOLATE, FERRITIN, TIBC, IRON, RETICCTPCT in the last 72 hours. Urine analysis:    Component Value Date/Time  COLORURINE AMBER (A) 12/13/2018 2218   APPEARANCEUR HAZY (A) 12/13/2018 2218   LABSPEC 1.027 12/13/2018 2218   PHURINE 5.0 12/13/2018 2218   GLUCOSEU NEGATIVE 12/13/2018 2218   HGBUR SMALL (A) 12/13/2018 2218   HGBUR small 06/28/2009 1504   BILIRUBINUR SMALL (A) 12/13/2018 2218   KETONESUR 5 (A) 12/13/2018 2218   PROTEINUR 30 (A) 12/13/2018 2218   UROBILINOGEN 1.0 09/28/2014 1426   NITRITE NEGATIVE 12/13/2018 2218   LEUKOCYTESUR TRACE (A) 12/13/2018 2218   Sepsis Labs: @LABRCNTIP (procalcitonin:4,lacticidven:4)  ) Recent Results (from the past 240 hour(s))  Urine culture     Status: Abnormal   Collection Time: 12/13/18  9:00 PM   Specimen: Urine, Random  Result Value Ref Range Status   Specimen Description URINE, RANDOM  Final   Special Requests   Final    ADDED 0444 12/14/2018 Performed at [facility], [address] [place], [place]    Culture MULTIPLE SPECIES PRESENT, SUGGEST RECOLLECTION (A)  Final   Report Status 12/15/2018 FINAL  Final  SARS CORONAVIRUS 2 (TAT 6-24 HRS) Nasopharyngeal     Status: None   Collection Time: 12/13/18  9:58 PM    Specimen: Nasopharyngeal  Result Value Ref Range Status   SARS Coronavirus 2 NEGATIVE NEGATIVE Final    Comment: (NOTE) SARS-CoV-2 target nucleic acids are NOT DETECTED. The SARS-CoV-2 RNA is generally detectable in upper and lower respiratory specimens during the acute phase of infection. Negative results do not preclude SARS-CoV-2 infection, do not rule out co-infections with other pathogens, and should not be used as the sole basis for treatment or other patient management decisions. Negative results must be combined with clinical observations, patient history, and epidemiological information. The expected result is Negative. Fact Sheet for Patients: 13/10/20 Fact Sheet for Healthcare Providers: HairSlick.no This test is not yet approved or cleared by the [place] FDA and  has been authorized for detection and/or diagnosis of SARS-CoV-2 by FDA under an Emergency Use Authorization (EUA). This EUA will remain  in effect (meaning this test can be used) for the duration of the COVID-19 declaration under Section 56 4(b)(1) of the Act, 21 U.S.C. section 360bbb-3(b)(1), unless the authorization is terminated or revoked sooner. Performed at [facility], [address] [place], [place]   Culture, Urine     Status: Abnormal   Collection Time: 12/15/18  3:10 PM   Specimen: Urine, Clean Catch  Result Value Ref Range Status   Specimen Description URINE, CLEAN CATCH  Final   Special Requests NONE  Final   Culture (A)  Final    <10,000 COLONIES/mL INSIGNIFICANT GROWTH Performed at [facility], [address], [place], [place]    Report Status 12/16/2018 FINAL  Final      Radiology Studies: No results found.   Scheduled Meds: . enoxaparin (LOVENOX) injection  40 mg Subcutaneous Q24H  . folic acid  1 mg Oral Daily  . multivitamin with minerals  1 tablet Oral Daily  . nicotine  21  mg Transdermal Daily   Continuous Infusions: . sodium chloride 100 mL/hr at 12/18/18 0345  . thiamine injection 500 mg (12/18/18 0838)     LOS: 5 days   Time Spent in minutes   30 minutes  Andres Vest D.O. on 12/18/2018 at 10:12 AM  Between 7am to 7pm - Please see pager noted on [email or web address]  After 7pm go to [email or web address]  And look for the night coverage person covering for  me after hours  [facility] Office  [phone]

## 2018-12-19 MED ORDER — VITAMIN B-1 100 MG PO TABS: 100.0000 mg | ORAL_TABLET | Freq: Every day | ORAL | Status: DC

## 2018-12-19 MED ORDER — THIAMINE HCL 100 MG/ML IJ SOLN
250.0000 mg | Freq: Two times a day (BID) | INTRAVENOUS | Status: AC
  Administered 2018-12-19 – 2018-12-22 (×6): 250 mg via INTRAVENOUS
  Filled 2018-12-19 (×7): qty 2.5

## 2018-12-19 NOTE — [place] FL2 (Addendum)
[place] MEDICAID FL2 LEVEL OF CARE SCREENING TOOL     IDENTIFICATION  Patient Name: Marie Woods Birthdate: 01-19-1956 Sex: female Admission Date (Current Location): 12/13/2018  [facility] and [place] Number:  [place] Medicaid Pending Facility and Address:  [facility], [address] [place], [place] [address]      Provider Number: [id number]  Attending Physician Name and Address:  Edsel Petrin, DO  Relative Name and Phone Number:  Sheria Lang, Niece, [phone]    Current Level of Care: Hospital Recommended Level of Care: Skilled Nursing Facility Prior Approval Number:    Date Approved/Denied:   PASRR Number: [id number] End date 01/20/19  Discharge Plan: SNF    Current Diagnoses: Patient Active Problem List   Diagnosis Date Noted  . AMS (altered mental status) 12/13/2018  . Chronic pain of left knee 04/12/2017  . Chronic pain of right knee 04/12/2017  . ASCUS with positive high risk HPV cervical 05/23/2015  . Healthcare maintenance 03/28/2015  . Alopecia 06/13/2014  . DEPRESSION 04/03/2010  . TOBACCO ABUSE 06/28/2009  . ALLERGIC RHINITIS 06/28/2009  . History of alcohol use 01/10/2009  . Essential hypertension 01/10/2009  . Asthma 01/10/2009  . Dermatitis, atopic 01/10/2009    Orientation RESPIRATION BLADDER Height & Weight     Self, Place  Normal Incontinent Weight: 217 lb (98.4 kg) Height:  5\' 7"  (170.2 cm)  BEHAVIORAL SYMPTOMS/MOOD NEUROLOGICAL BOWEL NUTRITION STATUS      Continent Diet(Regular diet, thin liquids)  AMBULATORY STATUS COMMUNICATION OF NEEDS Skin   Limited Assist Verbally Normal                       Personal Care Assistance Level of Assistance  Bathing, Feeding, Dressing, Total care Bathing Assistance: Limited assistance Feeding assistance: Independent Dressing Assistance: Limited assistance Total Care Assistance: Limited assistance   Functional Limitations Info  Sight, Hearing,  Speech Sight Info: Adequate Hearing Info: Adequate Speech Info: Adequate    SPECIAL CARE FACTORS FREQUENCY  PT (By licensed PT), OT (By licensed OT)     PT Frequency: 5x/wk OT Frequency: 5x/wk            Contractures Contractures Info: Not present    Additional Factors Info  Code Status, Allergies Code Status Info: Full Code Allergies Info: No Known Allergies           Current Medications (12/19/2018):  This is the current hospital active medication list Current Facility-Administered Medications  Medication Dose Route Frequency Provider Last Rate Last Dose  . 0.9 %  sodium chloride infusion   Intravenous Continuous 12/21/2018, DO 100 mL/hr at 12/19/18 12/21/18    . acetaminophen (TYLENOL) tablet 650 mg  650 mg Oral Q6H PRN 5956, MD   650 mg at 12/19/18 0827   Or  . acetaminophen (TYLENOL) suppository 650 mg  650 mg Rectal Q6H PRN 12/21/18 L, MD      . enoxaparin (LOVENOX) injection 40 mg  40 mg Subcutaneous Q24H Earlie Lou L, MD   40 mg at 12/18/18 2330  . folic acid (FOLVITE) tablet 1 mg  1 mg Oral Daily 2331, DO   1 mg at 12/19/18 0813  . LORazepam (ATIVAN) injection 1 mg  1 mg Intravenous Once Bodenheimer, Charles A, NP      . multivitamin with minerals tablet 1 tablet  1 tablet Oral Daily 12/21/18, DO   1 tablet at 12/19/18 0813  . nicotine (NICODERM CQ -  dosed in mg/24 hours) patch 21 mg  21 mg Transdermal Daily Elwyn Reach, MD   21 mg at 12/19/18 0815  . ondansetron (ZOFRAN) tablet 4 mg  4 mg Oral Q6H PRN Elwyn Reach, MD       Or  . ondansetron (ZOFRAN) injection 4 mg  4 mg Intravenous Q6H PRN Gala Romney L, MD      . phenol (CHLORASEPTIC) mouth spray 1 spray  1 spray Mouth/Throat PRN Cristal Ford, DO   1 spray at 12/19/18 0818  . thiamine (B-1) 250 mg in sodium chloride 0.9 % 50 mL IVPB  250 mg Intravenous BID Cristal Ford, DO         Discharge Medications: Please see discharge summary for a  list of discharge medications.  Relevant Imaging Results:  Relevant Lab Results:   Additional Information SSN: [id number]; COVID negative on 12/13/2018  Caitlin B Pinion, LCSWA

## 2018-12-19 NOTE — Plan of Care (Signed)
  Problem: Clinical Measurements: Goal: Will remain free from infection Outcome: Progressing   Problem: Education: Goal: Knowledge of General Education information will improve Description: Including pain rating scale, medication(s)/side effects and non-pharmacologic comfort measures Outcome: Not Progressing   

## 2018-12-19 NOTE — Progress Notes (Addendum)
PROGRESS NOTE    Marie Congressdith D Magri  ZOX:[id number] DOB: 04/19/1955 DOA: 12/13/2018 PCP: Marcine MatarJohnson, Deborah B, MD   Brief Narrative:  HPI On 12/13/2018 by Dr. Earlie LouMohammad Garba Marie Woods is a 63 y.o. female with medical history significant of asthma, hypertension, alcohol abuse who was brought in by EMS after neighbors called in stating that patient is confused and altered.  She is currently disoriented and not able to give coherent history.  Most history therefore was obtained from the neighbors who assisted EMS.  Has been sick gradually over a month.  Apparently.  She has however been coherent able to communicate without any known problems.  Today however she is not herself.  She told me she thinks she is somewhere in the house not in the hospital.  She is not tremulous and she is not aggressive.  Her alcohol level was negative.  No reported drug use.  Patient is being admitted with altered mental status for work-up.  So far no UTI or pneumonia identified..  Interim history  Patient admitted with acute metabolic encephalopathy thought to be secondary to UTI.  Urine culture showed multiple species, will recollect.  Patient continues to have some confusion, MRI brain showed Wernicke's. Assessment & Plan   Severe Sepsis with Acute metabolic encephalopathy secondary to UTI/Wernicke's encephalopathy -On presentation, patient noted to be tachycardic with tachypnea in the emergency department.  Now patient with leukocytosis -CT head mild atrophic changes without acute intracranial abnormality -Chest x-ray unremarkable for infection -UA rare bacteria, 11-20 WBC, trace leukocytes -Urine culture showed multiple species, repeat culture showed <10K colonies  -Was initially placed on ceftriaxone, IV fluids- will discontinue ceftriaxone  -Status slowly improving, patient is alert and oriented to person, place, and time, however not situation.  Difficult to keep patient focused and needing to be reoriented  frequently. -There was question of patient having alcohol abuse however alcohol level less than 10.  Urine drug screen negative -MRI brain: Symmetric diffusion weighted and T2/FLAIR hyperintense signal abnormality within the medial thalami.  Findings favor sequelae of Wernicke's encephalopathy -Started patient on high-dose thiamine -Discussed with neurology on 12/16/2018, Dr. Laurence SlateAroor, recommended thiamine 500 mg 3 times daily for 3 days then followed by 100 mg daily. -Of note vitamin B 1 level ordered however patient has been on thiamine since admission, therefore level may not be accurate. -will place on thimain 250mg  BID (per conversation with Dr. Otelia LimesLindzen todyay) for 3 days then transition to oral   Essential Hypertension -Amlodipine currently held, BP stable  History of alcohol abuse -As above, alcohol level less than 10 -Continue folate, thiamine, multivitamin -Monitor for withdrawal- continue CIWA protocol  History of tobacco abuse -Continue nicotine patch  Bed bug/ Deconditioning -bed bug found on patient, continue contact precautions -PT/OT-recommended SNF -SW/CM consulted -Feel that patient does need supervision as she appears to have short-term memory impairment.  We discussed the reason for her hospitalization on a daily basis but she does not remember the next day.  DVT Prophylaxis  Lovenox  Code Status: Full  Family Communication: None at bedside  Disposition Plan: Admitted. Pending improvement in mental status. Dispo likely SNF- however given no insurance and alcohol history- she may be a difficult placement  Consultants Neurology via phone, Dr. Laurence SlateAroor  Procedures  None  Antibiotics   Anti-infectives (From admission, onward)   Start     Dose/Rate Route Frequency Ordered Stop   12/14/18 0100  cefTRIAXone (ROCEPHIN) 1 g in sodium chloride 0.9 % 100 mL IVPB  Status:  Discontinued     1 g 200 mL/hr over 30 Minutes Intravenous Every 24 hours 12/14/18 0010 12/17/18  2683      Subjective:   Marie Woods seen and examined today.  Patient with no complaints this morning.  Denies current chest pain, shortness breath, abdominal pain, nausea vomiting, diarrhea constipation, dizziness or headache.  She continues to ask why she is in the hospital never seems to remember.  Admits to being under a lot of stress and drinks "lots of beer". Objective:   Vitals:   12/18/18 1318 12/18/18 2059 12/18/18 2130 12/19/18 0551  BP: 120/83 117/80 112/75 118/85  Pulse: 75 88 82 80  Resp: 20 18  18   Temp: 98.4 F (36.9 C) 98 F (36.7 C)  98 F (36.7 C)  TempSrc: Oral   Oral  SpO2: 100% 100%  100%  Weight:      Height:       No intake or output data in the 24 hours ending 12/19/18 1120 Filed Weights   12/15/18 0101  Weight: 98.4 kg   Exam  General: Well developed, chronically ill-appearing, NAD  HEENT: NCAT, mucous membranes moist.   Cardiovascular: S1 S2 auscultated, RRR, no murmur  Respiratory: Clear to auscultation bilaterally with equal chest rise  Abdomen: Soft, nontender, nondistended, + bowel sounds  Extremities: warm dry without cyanosis clubbing or edema  Neuro: AAOx3 (self, place, time but not situation), nonfocal  Psych: Appropriate mood and affect  Data Reviewed: I have personally reviewed following labs and imaging studies  CBC: Recent Labs  Lab 12/13/18 1534 12/14/18 0030 12/14/18 0454 12/15/18 0303 12/16/18 0631  WBC 9.8 10.4 12.4* 13.3* 9.5  NEUTROABS 6.9  --   --   --   --   HGB 13.8 13.7 13.7 14.9 13.6  HCT 40.4 40.0 41.4 43.4 40.8  MCV 87.8 88.3 90.0 86.8 89.9  PLT 240 127* 225 247 168   Basic Metabolic Panel: Recent Labs  Lab 12/13/18 1534 12/14/18 0030 12/14/18 0454 12/15/18 0303 12/15/18 1009  NA 138  --  139 139  --   K 3.8  --  4.0 3.6  --   CL 102  --  105 105  --   CO2 19*  --  20* 20*  --   GLUCOSE 116*  --  115* 98  --   BUN 19  --  17 17  --   CREATININE 1.09* 0.91 0.91 0.86  --   CALCIUM 9.9  --   9.5 9.5  --   MG  --   --   --   --  2.4  PHOS  --   --   --   --  4.1   GFR: Estimated Creatinine Clearance: 82.7 mL/min (by C-G formula based on SCr of 0.86 mg/dL). Liver Function Tests: Recent Labs  Lab 12/13/18 1534 12/14/18 0454  AST 45* 35  ALT 42 36  ALKPHOS 37* 31*  BILITOT 1.3* 0.9  PROT 7.9 7.1  ALBUMIN 4.0 3.7   No results for input(s): LIPASE, AMYLASE in the last 168 hours. Recent Labs  Lab 12/13/18 2048  AMMONIA 29   Coagulation Profile: No results for input(s): INR, PROTIME in the last 168 hours. Cardiac Enzymes: No results for input(s): CKTOTAL, CKMB, CKMBINDEX, TROPONINI in the last 168 hours. BNP (last 3 results) No results for input(s): PROBNP in the last 8760 hours. HbA1C: No results for input(s): HGBA1C in the last 72 hours. CBG: Recent Labs  Lab  12/13/18 2007  GLUCAP 95   Lipid Profile: No results for input(s): CHOL, HDL, LDLCALC, TRIG, CHOLHDL, LDLDIRECT in the last 72 hours. Thyroid Function Tests: No results for input(s): TSH, T4TOTAL, FREET4, T3FREE, THYROIDAB in the last 72 hours. Anemia Panel: No results for input(s): VITAMINB12, FOLATE, FERRITIN, TIBC, IRON, RETICCTPCT in the last 72 hours. Urine analysis:    Component Value Date/Time   COLORURINE AMBER (A) 12/13/2018 2218   APPEARANCEUR HAZY (A) 12/13/2018 2218   LABSPEC 1.027 12/13/2018 2218   PHURINE 5.0 12/13/2018 2218   GLUCOSEU NEGATIVE 12/13/2018 2218   HGBUR SMALL (A) 12/13/2018 2218   HGBUR small 06/28/2009 1504   BILIRUBINUR SMALL (A) 12/13/2018 2218   KETONESUR 5 (A) 12/13/2018 2218   PROTEINUR 30 (A) 12/13/2018 2218   UROBILINOGEN 1.0 09/28/2014 1426   NITRITE NEGATIVE 12/13/2018 2218   LEUKOCYTESUR TRACE (A) 12/13/2018 2218   Sepsis Labs: (procalcitonin:4,lacticidven:4)  ) Recent Results (from the past 240 hour(s))  Urine culture     Status: Abnormal   Collection Time: 12/13/18  9:00 PM   Specimen: Urine, Random  Result Value Ref Range Status    Specimen Description URINE, RANDOM  Final   Special Requests   Final    ADDED 0444 12/14/2018 Performed at [facility], [address], [place], [place] [address]    Culture MULTIPLE SPECIES PRESENT, SUGGEST RECOLLECTION (A)  Final   Report Status 12/15/2018 FINAL  Final  SARS CORONAVIRUS 2 (TAT 6-24 HRS) Nasopharyngeal     Status: None   Collection Time: 12/13/18  9:58 PM   Specimen: Nasopharyngeal  Result Value Ref Range Status   SARS Coronavirus 2 NEGATIVE NEGATIVE Final    Comment: (NOTE) SARS-CoV-2 target nucleic acids are NOT DETECTED. The SARS-CoV-2 RNA is generally detectable in upper and lower respiratory specimens during the acute phase of infection. Negative results do not preclude SARS-CoV-2 infection, do not rule out co-infections with other pathogens, and should not be used as the sole basis for treatment or other patient management decisions. Negative results must be combined with clinical observations, patient history, and epidemiological information. The expected result is Negative. Fact Sheet for Patients: HairSlick.no Fact Sheet for Healthcare Providers: quierodirigir.com This test is not yet approved or cleared by the [place] FDA and  has been authorized for detection and/or diagnosis of SARS-CoV-2 by FDA under an Emergency Use Authorization (EUA). This EUA will remain  in effect (meaning this test can be used) for the duration of the COVID-19 declaration under Section 56 4(b)(1) of the Act, 21 U.S.C. section 360bbb-3(b)(1), unless the authorization is terminated or revoked sooner. Performed at [facility], [address] [place], [place] [address]   Culture, Urine     Status: Abnormal   Collection Time: 12/15/18  3:10 PM   Specimen: Urine, Clean Catch  Result Value Ref Range Status   Specimen Description URINE, CLEAN CATCH  Final   Special Requests NONE  Final   Culture (A)  Final     <10,000 COLONIES/mL INSIGNIFICANT GROWTH Performed at [facility], [address] [place], [place] [address]    Report Status 12/16/2018 FINAL  Final      Radiology Studies: No results found.   Scheduled Meds: . enoxaparin (LOVENOX) injection  40 mg Subcutaneous Q24H  . folic acid  1 mg Oral Daily  . LORazepam  1 mg Intravenous Once  . multivitamin with minerals  1 tablet Oral Daily  . nicotine  21 mg  Transdermal Daily   Continuous Infusions: . sodium chloride 100 mL/hr at 12/19/18 0821     LOS: 6 days   Time Spent in minutes   30 minutes  Bryar Rennie D.O. on 12/19/2018 at 11:20 AM  Between 7am to 7pm - Please see pager noted on [email or web address]  After 7pm go to [email or web address]  And look for the night coverage person covering for me after hours  [facility] Office  [phone]

## 2018-12-19 NOTE — Progress Notes (Signed)
Occupational Therapy Treatment Patient Details Name: Marie Woods MRN: [id number] DOB: 1955/11/28 Today's Date: 12/19/2018    History of present illness Patient is a 63 year old female with medical history significant of asthma, hypertension, alcohol abuse who was brought in by EMS after neighbors called in stating that patient is confused and altered. Patient admitted Severe Sepsis with Acute metabolic encephalopathy secondary to UTI.    OT comments  Pt needing +2 assist this session for functional transfers and sit <>stand secondary to L LE weakness and safety. Once pt seated in recliner chair, pt began to perseverate on locating her cell phone and needing cuing for redirection. Pt with increased confusion in regards to ordering food and needing step by step mod multimodal cuing to turn on phone and call to order food. Hospital phone also rang during this session and pt needing cuing to problem solve how to answer phone. Set up A for grooming tasks while seated and pt fixing herself snack with items placed in front of her and increased time to complete. Pt would continue to benefit from OT intervention to address functional deficits.   Follow Up Recommendations  SNF;Supervision/Assistance - 24 hour    Equipment Recommendations  Other (comment)(defer to next venue of care)       Precautions / Restrictions Precautions Precautions: Fall Precaution Comments: Contact precautions- bed bugs Restrictions Weight Bearing Restrictions: No       Mobility Bed Mobility    General bed mobility comments: seated on EOB with PT as OT enters the room  Transfers Overall transfer level: Needs assistance Equipment used: Rolling walker (2 wheeled) Transfers: Sit to/from Stand;Stand Pivot Transfers Sit to Stand: From elevated surface;+2 physical assistance;Min assist;Mod assist Stand pivot transfers: Min assist;+2 physical assistance       General transfer comment: pt reports increased numbness  and weakness in L LE this session, multimodal cuing for sequencing/hand placement, and assist with advancement of RW    Balance Overall balance assessment: Needs assistance Sitting-balance support: Feet supported Sitting balance-Leahy Scale: Good     Standing balance support: Bilateral upper extremity supported;During functional activity Standing balance-Leahy Scale: Poor Standing balance comment: reliance of RW        ADL either performed or assessed with clinical judgement   ADL Overall ADL's : Needs assistance/impaired     Grooming: Set up;Sitting;Wash/dry face;Wash/dry hands;Brushing hair                      Cognition Arousal/Alertness: Awake/alert Behavior During Therapy: WFL for tasks assessed/performed Overall Cognitive Status: Impaired/Different from baseline Area of Impairment: Following commands;Safety/judgement;Awareness;Attention;Memory      Current Attention Level: Sustained Memory: Decreased short-term memory Following Commands: Follows one step commands with increased time Safety/Judgement: Decreased awareness of safety;Decreased awareness of deficits Awareness: Emergent   General Comments: perseverating on trying to find her phone                   Pertinent Vitals/ Pain       Pain Assessment: No/denies pain         Frequency  Min 2X/week        Progress Toward Goals  OT Goals(current goals can now be found in the care plan section)  Progress towards OT goals: Progressing toward goals  Acute Rehab OT Goals Patient Stated Goal: to go homr OT Goal Formulation: With patient Time For Goal Achievement: 01/02/19 Potential to Achieve Goals: Good  Plan Discharge plan remains appropriate  Co-evaluation    PT/OT/SLP Co-Evaluation/Treatment: Yes Reason for Co-Treatment: For patient/therapist safety;To address functional/ADL transfers   OT goals addressed during session: ADL's and self-care      AM-PAC OT "6 Clicks" Daily  Activity     Outcome Measure   Help from another person eating meals?: None Help from another person taking care of personal grooming?: A Little Help from another person toileting, which includes using toliet, bedpan, or urinal?: A Lot Help from another person bathing (including washing, rinsing, drying)?: A Lot Help from another person to put on and taking off regular upper body clothing?: A Little Help from another person to put on and taking off regular lower body clothing?: A Lot 6 Click Score: 16    End of Session Equipment Utilized During Treatment: Gait belt;Rolling walker  OT Visit Diagnosis: Unsteadiness on feet (R26.81);Other abnormalities of gait and mobility (R26.89);Muscle weakness (generalized) (M62.81);Other symptoms and signs involving cognitive function   Activity Tolerance Patient tolerated treatment well   Patient Left in chair;with call bell/phone within reach;with chair alarm set   Nurse Communication          Time: 1093-2355 OT Time Calculation (min): 29 min  Charges: OT General Charges $OT Visit: 1 Visit OT Treatments $Self Care/Home Management : 8-22 mins   Jackquline Denmark P MS, OTR/L 12/19/2018, 1:03 PM

## 2018-12-19 NOTE — TOC Initial Note (Signed)
Transition of Care [facility]) - Initial/Assessment Note    Patient Details  Name: Marie Woods MRN: [id number] Date of Birth: Feb 01, 1956  Transition of Care [facility]) CM/SW Contact:    Gwenlyn Fudge, LCSWA Phone Number: 12/19/2018, 1:43 PM  Clinical Narrative:                  CSW called and spoke with the patient's niece, Bjorn Loser. CSW introduced herself and explained her role. CSW shared therapy recommendation. Niece is agreeable to SNF, CSW explained LOG process since the patient doesn't have insurance. CSW stated she would provide bed offers if/when they became available.   CSW Animal nutritionist, Noralee Stain. Eunice Blase is out of the office today, but we should have any answer about a pending medicaid application on Tuesday.   CSW called and spoke with [facility] Supervisor, Macario Golds. Verdon Cummins is agreeable to doing an LOG. She advised the CSW to fax the patient out.   CSW has faxed the patient out and is awaiting bed offers. CSW submitted for PASSR screening. It is currently under manual review. CSW will submit requested clinicals.   CSW will continue to follow and assist as needed.  Expected Discharge Plan: Skilled Nursing Facility Barriers to Discharge: Continued Medical Work up, SNF Pending payor source - LOG   Patient Goals and CMS Choice Patient states their goals for this hospitalization and ongoing recovery are:: Pt niece wants her aunt to feel better CMS Medicare.gov Compare Post Acute Care list provided to:: Other (Comment Required) Choice offered to / list presented to : (Patient's Niece)  Expected Discharge Plan and Services Expected Discharge Plan: Skilled Nursing Facility In-house Referral: Clinical Social Work Discharge Planning Services: NA Post Acute Care Choice: Skilled Nursing Facility Living arrangements for the past 2 months: Apartment                 DME Arranged: N/A DME Agency: NA                  Prior Living  Arrangements/Services Living arrangements for the past 2 months: Apartment Lives with:: Self Patient language and need for interpreter reviewed:: No Do you feel safe going back to the place where you live?: No   Pt can't return home alone  Need for Family Participation in Patient Care: No (Comment) Care giver support system in place?: Yes (comment)   Criminal Activity/Legal Involvement Pertinent to Current Situation/Hospitalization: No - Comment as needed  Activities of Daily Living Home Assistive Devices/Equipment: Walker (specify type)("when my knees swell up") ADL Screening (condition at time of admission) Patient's cognitive ability adequate to safely complete daily activities?: No Is the patient deaf or have difficulty hearing?: No Does the patient have difficulty seeing, even when wearing glasses/contacts?: No Does the patient have difficulty concentrating, remembering, or making decisions?: Yes Patient able to express need for assistance with ADLs?: No Does the patient have difficulty dressing or bathing?: No Independently performs ADLs?: Yes (appropriate for developmental age) Does the patient have difficulty walking or climbing stairs?: No Weakness of Legs: None Weakness of Arms/Hands: None  Permission Sought/Granted Permission sought to share information with : Case Manager Permission granted to share information with : Yes, Verbal Permission Granted     Permission granted to share info w AGENCY: All SNF        Emotional Assessment Appearance:: Appears stated age Attitude/Demeanor/Rapport: Unable to Assess Affect (typically observed): Unable to Assess Orientation: : Oriented to Self, Oriented to Place Alcohol / Substance  Use: Not Applicable Psych Involvement: No (comment)  Admission diagnosis:  Confusion [R41.0] Altered mental status, unspecified altered mental status type [H82.99] Acute metabolic encephalopathy [B71.69] AMS (altered mental status)  [R41.82] Patient Active Problem List   Diagnosis Date Noted  . AMS (altered mental status) 12/13/2018  . Chronic pain of left knee 04/12/2017  . Chronic pain of right knee 04/12/2017  . ASCUS with positive high risk HPV cervical 05/23/2015  . Healthcare maintenance 03/28/2015  . Alopecia 06/13/2014  . DEPRESSION 04/03/2010  . TOBACCO ABUSE 06/28/2009  . ALLERGIC RHINITIS 06/28/2009  . History of alcohol use 01/10/2009  . Essential hypertension 01/10/2009  . Asthma 01/10/2009  . Dermatitis, atopic 01/10/2009   PCP:  Ladell Pier, MD Pharmacy:   [facility], [address] [place] [address] Phone: [phone] Fax: [phone]     Social Determinants of Health (SDOH) Interventions    Readmission Risk Interventions No flowsheet data found.

## 2018-12-19 NOTE — Progress Notes (Signed)
Physical Therapy Treatment Patient Details Name: Marie Woods MRN: [id number] DOB: 11-25-1955 Today's Date: 12/19/2018    History of Present Illness Patient is a 63 year old female with medical history significant of asthma, hypertension, alcohol abuse who was brought in by EMS after neighbors called in stating that patient is confused and altered. Patient admitted Severe Sepsis with Acute metabolic encephalopathy secondary to UTI.     PT Comments    Pt needing +2 mod A for transfers today due to LLE numbness which she reports is new but also seems to be related to L knee pain related to OA and do not think pt has been up past few days. Pt able to actively extend L knee in sitting, strength 3+/5, but L knee buckling with stepping RLE. Pt also demonstrates continued cognitive issues, thinks it is 2013, having trouble using phone, cannot remember that she has had pain meds and whether or not she ordered a tray for breakfast or lunch. Continue to recommend 24 hr assist at d/c. PT will continue to follow.    Follow Up Recommendations  SNF;Supervision/Assistance - 24 hour     Equipment Recommendations  Rolling walker with 5" wheels    Recommendations for Other Services       Precautions / Restrictions Precautions Precautions: Fall Precaution Comments: was on contact for bed bugs, not on on 11/16 Restrictions Weight Bearing Restrictions: No    Mobility  Bed Mobility Overal bed mobility: Needs Assistance Bed Mobility: Supine to Sit     Supine to sit: Supervision     General bed mobility comments: supervision to come to EOB, needed increased time as well as vc's to continue scooting until feet were on floor.   Transfers Overall transfer level: Needs assistance Equipment used: Rolling walker (2 wheeled) Transfers: Sit to/from [facility] Sit to Stand: From elevated surface;+2 physical assistance;Mod assist Stand pivot transfers: +2 physical assistance;Mod  assist       General transfer comment: pt reports LLE feeling numb. She can actively extend L knee, but L knee buckling in standing.   Ambulation/Gait         Gait velocity: decreased   General Gait Details: unable due to L knee instability, decreased safety   Stairs             Wheelchair Mobility    Modified Rankin (Stroke Patients Only)       Balance Overall balance assessment: Needs assistance Sitting-balance support: Feet supported Sitting balance-Leahy Scale: Good     Standing balance support: Bilateral upper extremity supported;During functional activity Standing balance-Leahy Scale: Poor Standing balance comment: heavy reliance of RW                            Cognition Arousal/Alertness: Awake/alert Behavior During Therapy: WFL for tasks assessed/performed Overall Cognitive Status: Impaired/Different from baseline Area of Impairment: Following commands;Safety/judgement;Awareness;Attention;Memory;Problem solving;Orientation                 Orientation Level: Disoriented to;Time Current Attention Level: Sustained Memory: Decreased short-term memory Following Commands: Follows one step commands with increased time Safety/Judgement: Decreased awareness of safety;Decreased awareness of deficits Awareness: Emergent Problem Solving: Difficulty sequencing;Requires verbal cues General Comments: pt thinks it is 2013, she has not been ordering her meal trays, she cannot figure out how to use her phone consistently      Exercises General Exercises - Lower Extremity Ankle Circles/Pumps: AROM;Both;10 reps;Seated Long Arc Quad: AROM;Left;10 reps;Seated  General Comments General comments (skin integrity, edema, etc.): RN present and aware of pt c/o LLE numbness, pt also reports L knee discomfort due to OA      Pertinent Vitals/Pain Pain Assessment: Faces Faces Pain Scale: Hurts even more Pain Location: headache Pain Descriptors /  Indicators: Headache Pain Intervention(s): Limited activity within patient's tolerance;Monitored during session;Premedicated before session(RN notified and reported pt had meds previously)    Home Living                      Prior Function            PT Goals (current goals can now be found in the care plan section) Acute Rehab PT Goals Patient Stated Goal: to go home PT Goal Formulation: With patient Time For Goal Achievement: 12/29/18 Potential to Achieve Goals: Good Progress towards PT goals: Not progressing toward goals - comment(LLE numbness)    Frequency    Min 3X/week      PT Plan Current plan remains appropriate    Co-evaluation   Reason for Co-Treatment: For patient/therapist safety;To address functional/ADL transfers   OT goals addressed during session: ADL's and self-care      AM-PAC PT "6 Clicks" Mobility   Outcome Measure  Help needed turning from your back to your side while in a flat bed without using bedrails?: None Help needed moving from lying on your back to sitting on the side of a flat bed without using bedrails?: None Help needed moving to and from a bed to a chair (including a wheelchair)?: A Lot Help needed standing up from a chair using your arms (e.g., wheelchair or bedside chair)?: A Lot Help needed to walk in hospital room?: A Lot Help needed climbing 3-5 steps with a railing? : Total 6 Click Score: 15    End of Session Equipment Utilized During Treatment: Gait belt Activity Tolerance: Patient tolerated treatment well Patient left: in chair;with call bell/phone within reach;with chair alarm set Nurse Communication: Mobility status PT Visit Diagnosis: Other abnormalities of gait and mobility (R26.89);Muscle weakness (generalized) (M62.81);Difficulty in walking, not elsewhere classified (R26.2)     Time: 1022-1050 PT Time Calculation (min) (ACUTE ONLY): 28 min  Charges:  $Therapeutic Activity: 23-37 mins                      Lyanne Co, PT  Acute Rehab Services  Pager [phone] Office [phone]    Lawana Chambers Suri Tafolla 12/19/2018, 1:32 PM

## 2018-12-19 NOTE — TOC Progression Note (Signed)
Transition of Care [facility]) - Progression Note    Patient Details  Name: Marie Woods MRN: [id number] Date of Birth: Aug 21, 1955  Transition of Care [facility]) CM/SW [place], [place] Phone Number: 12/19/2018, 4:47 PM  Clinical Narrative:     CSW submitted clinicals to [place] for PASSR review.   CSW will continue to follow and assist with disposition planning.   Expected Discharge Plan: [place] Barriers to Discharge: Continued Medical Work up, SNF Pending payor source - LOG  Expected Discharge Plan and Services Expected Discharge Plan: [place] In-house Referral: Clinical Social Work Discharge Planning Services: NA Post Acute Care Choice: [place] Living arrangements for the past 2 months: Apartment                 DME Arranged: N/A DME Agency: NA                   Social Determinants of Health (SDOH) Interventions    Readmission Risk Interventions No flowsheet data found.

## 2018-12-20 LAB — CREATININE, SERUM
Creatinine, Ser: 0.58 mg/dL (ref 0.44–1.00)
GFR calc Af Amer: >60 mL/min (ref >60)
GFR calc non Af Amer: >60 mL/min (ref >60)

## 2018-12-20 NOTE — Progress Notes (Addendum)
PROGRESS NOTE    Marie Woods  ZOX:[id number] DOB: [birth year] DOA: 12/13/2018 PCP: Ladell Pier, MD   Brief Narrative:  HPI On 12/13/2018 by Dr. Gala Romney Marie Woods is a 63 y.o. female with medical history significant of asthma, hypertension, alcohol abuse who was brought in by EMS after neighbors called in stating that patient is confused and altered.  She is currently disoriented and not able to give coherent history.  Most history therefore was obtained from the neighbors who assisted EMS.  Has been sick gradually over a month.  Apparently.  She has however been coherent able to communicate without any known problems.  Today however she is not herself.  She told me she thinks she is somewhere in the house not in the hospital.  She is not tremulous and she is not aggressive.  Her alcohol level was negative.  No reported drug use.  Patient is being admitted with altered mental status for work-up.  So far no UTI or pneumonia identified..  Interim history  Patient admitted with acute metabolic encephalopathy thought to be secondary to UTI.  Urine culture showed multiple species, will recollect.  Patient continues to have some confusion, MRI brain showed Wernicke's.  Currently receiving IV thiamine.  Needing SNF. Assessment & Plan   Severe Sepsis with Acute metabolic encephalopathy secondary to UTI/Wernicke's encephalopathy -On presentation, patient noted to be tachycardic with tachypnea in the emergency department.  Now patient with leukocytosis -CT head mild atrophic changes without acute intracranial abnormality -Chest x-ray unremarkable for infection -UA rare bacteria, 11-20 WBC, trace leukocytes -Urine culture showed multiple species, repeat culture showed <10K colonies  -Was initially placed on ceftriaxone, IV fluids- will discontinue ceftriaxone  -Status slowly improving, patient is alert and oriented to person, place, and time, however not situation.  Difficult to keep  patient focused and needing to be reoriented frequently. -There was question of patient having alcohol abuse however alcohol level less than 10.  Urine drug screen negative -MRI brain: Symmetric diffusion weighted and T2/FLAIR hyperintense signal abnormality within the medial thalami.  Findings favor sequelae of Wernicke's encephalopathy -Started patient on high-dose thiamine -Discussed with neurology on 12/16/2018, Dr. Lorraine Lax, recommended thiamine 500 mg 3 times daily for 3 days then followed by 100 mg daily. -Of note vitamin B 1 level ordered however patient has been on thiamine since admission, therefore level may not be accurate. -Discussed with Dr. Cheral Marker on 12/19/2018, recommended continuing thiamine IV 250 mg twice daily for 3 days and then transition to 100 mg oral daily.  Likely has memory impairment due to [facility].  Essential Hypertension -Amlodipine currently held, BP stable  History of alcohol abuse -As above, alcohol level less than 10 -Continue folate, thiamine, multivitamin -Monitor for withdrawal- continue CIWA protocol  History of tobacco abuse -Continue nicotine patch  Bed bug/ Deconditioning -bed bug found on patient, continue contact precautions -PT/OT-recommended SNF -SW/CM consulted -Feel that patient does need supervision as she appears to have short-term memory impairment.  We discussed the reason for her hospitalization on a daily basis but she does not remember the next day.  DVT Prophylaxis  Lovenox  Code Status: Full  Family Communication: None at bedside  Disposition Plan: Admitted. Pending improvement in mental status. Dispo likely SNF- however given no insurance and alcohol history- she may be a difficult placement. Patient still cannot understand why she is in the hospital despite this being explained to her several times.  Consultants Neurology via phone, Dr. Lorraine Lax  Procedures  None  Antibiotics   Anti-infectives (From admission, onward)    Start     Dose/Rate Route Frequency Ordered Stop   12/14/18 0100  cefTRIAXone (ROCEPHIN) 1 g in sodium chloride 0.9 % 100 mL IVPB  Status:  Discontinued     1 g 200 mL/hr over 30 Minutes Intravenous Every 24 hours 12/14/18 0010 12/17/18 4665      Subjective:   Marie Woods seen and examined today.  Patient with no complaints this morning.  Still does not understand why she is in the hospital.  Denies current chest pain, shortness of breath, abdominal pain, nausea or vomiting, diarrhea constipation, dizziness or headache. Objective:   Vitals:   12/19/18 1300 12/19/18 2131 12/20/18 0517 12/20/18 1000  BP: 132/82 133/75 126/86 140/86  Pulse: 82 81 77 76  Resp:   20 18  Temp: 98.3 F (36.8 C) 97.8 F (36.6 C) 98 F (36.7 C) 97.9 F (36.6 C)  TempSrc: Oral Oral Oral Oral  SpO2: 99% 100% 100% 100%  Weight:      Height:        Intake/Output Summary (Last 24 hours) at 12/20/2018 1123 Last data filed at 12/20/2018 0420 Gross per 24 hour  Intake 3132.3 ml  Output 200 ml  Net 2932.3 ml   Filed Weights   12/15/18 0101  Weight: 98.4 kg   Exam  General: Well developed, chronically ill-appearing, NAD  HEENT: NCAT, mucous membranes moist.   Cardiovascular: S1 S2 auscultated, RRR, no murmur  Respiratory: Clear to auscultation bilaterally with equal chest rise  Abdomen: Soft, nontender, nondistended, + bowel sounds  Extremities: warm dry without cyanosis clubbing or edema  Neuro: AAOx3 (self, place, time, however not situation), nonfocal  Psych: Normal affect and mood, pleasant  Data Reviewed: I have personally reviewed following labs and imaging studies  CBC: Recent Labs  Lab 12/13/18 1534 12/14/18 0030 12/14/18 0454 12/15/18 0303 12/16/18 0631  WBC 9.8 10.4 12.4* 13.3* 9.5  NEUTROABS 6.9  --   --   --   --   HGB 13.8 13.7 13.7 14.9 13.6  HCT 40.4 40.0 41.4 43.4 40.8  MCV 87.8 88.3 90.0 86.8 89.9  PLT 240 127* 225 247 168   Basic Metabolic Panel: Recent Labs   Lab 12/13/18 1534 12/14/18 0030 12/14/18 0454 12/15/18 0303 12/15/18 1009 12/20/18 0505  NA 138  --  139 139  --   --   K 3.8  --  4.0 3.6  --   --   CL 102  --  105 105  --   --   CO2 19*  --  20* 20*  --   --   GLUCOSE 116*  --  115* 98  --   --   BUN 19  --  17 17  --   --   CREATININE 1.09* 0.91 0.91 0.86  --  0.58  CALCIUM 9.9  --  9.5 9.5  --   --   MG  --   --   --   --  2.4  --   PHOS  --   --   --   --  4.1  --    GFR: Estimated Creatinine Clearance: 89 mL/min (by C-G formula based on SCr of 0.58 mg/dL). Liver Function Tests: Recent Labs  Lab 12/13/18 1534 12/14/18 0454  AST 45* 35  ALT 42 36  ALKPHOS 37* 31*  BILITOT 1.3* 0.9  PROT 7.9 7.1  ALBUMIN 4.0 3.7   No results  for input(s): LIPASE, AMYLASE in the last 168 hours. Recent Labs  Lab 12/13/18 2048  AMMONIA 29   Coagulation Profile: No results for input(s): INR, PROTIME in the last 168 hours. Cardiac Enzymes: No results for input(s): CKTOTAL, CKMB, CKMBINDEX, TROPONINI in the last 168 hours. BNP (last 3 results) No results for input(s): PROBNP in the last 8760 hours. HbA1C: No results for input(s): HGBA1C in the last 72 hours. CBG: Recent Labs  Lab 12/13/18 2007  GLUCAP 95   Lipid Profile: No results for input(s): CHOL, HDL, LDLCALC, TRIG, CHOLHDL, LDLDIRECT in the last 72 hours. Thyroid Function Tests: No results for input(s): TSH, T4TOTAL, FREET4, T3FREE, THYROIDAB in the last 72 hours. Anemia Panel: No results for input(s): VITAMINB12, FOLATE, FERRITIN, TIBC, IRON, RETICCTPCT in the last 72 hours. Urine analysis:    Component Value Date/Time   COLORURINE AMBER (A) 12/13/2018 2218   APPEARANCEUR HAZY (A) 12/13/2018 2218   LABSPEC 1.027 12/13/2018 2218   PHURINE 5.0 12/13/2018 2218   GLUCOSEU NEGATIVE 12/13/2018 2218   HGBUR SMALL (A) 12/13/2018 2218   HGBUR small 06/28/2009 1504   BILIRUBINUR SMALL (A) 12/13/2018 2218   KETONESUR 5 (A) 12/13/2018 2218   PROTEINUR 30 (A) 12/13/2018  2218   UROBILINOGEN 1.0 09/28/2014 1426   NITRITE NEGATIVE 12/13/2018 2218   LEUKOCYTESUR TRACE (A) 12/13/2018 2218   Sepsis Labs: @LABRCNTIP (procalcitonin:4,lacticidven:4)  ) Recent Results (from the past 240 hour(s))  Urine culture     Status: Abnormal   Collection Time: 12/13/18  9:00 PM   Specimen: Urine, Random  Result Value Ref Range Status   Specimen Description URINE, RANDOM  Final   Special Requests   Final    ADDED 0444 12/14/2018 Performed at [facility], [address] [place], [place] [address]    Culture MULTIPLE SPECIES PRESENT, SUGGEST RECOLLECTION (A)  Final   Report Status 12/15/2018 FINAL  Final  SARS CORONAVIRUS 2 (TAT 6-24 HRS) Nasopharyngeal     Status: None   Collection Time: 12/13/18  9:58 PM   Specimen: Nasopharyngeal  Result Value Ref Range Status   SARS Coronavirus 2 NEGATIVE NEGATIVE Final    Comment: (NOTE) SARS-CoV-2 target nucleic acids are NOT DETECTED. The SARS-CoV-2 RNA is generally detectable in upper and lower respiratory specimens during the acute phase of infection. Negative results do not preclude SARS-CoV-2 infection, do not rule out co-infections with other pathogens, and should not be used as the sole basis for treatment or other patient management decisions. Negative results must be combined with clinical observations, patient history, and epidemiological information. The expected result is Negative. Fact Sheet for Patients: HairSlick.nohttps://www.fda.gov/media/138098/download Fact Sheet for Healthcare Providers: quierodirigir.comhttps://www.fda.gov/media/138095/download This test is not yet approved or cleared by the [place] FDA and  has been authorized for detection and/or diagnosis of SARS-CoV-2 by FDA under an Emergency Use Authorization (EUA). This EUA will remain  in effect (meaning this test can be used) for the duration of the COVID-19 declaration under Section 56 4(b)(1) of the Act, 21 U.S.C. section 360bbb-3(b)(1), unless the  authorization is terminated or revoked sooner. Performed at [facility], [address] [place], [place] [address]   Culture, Urine     Status: Abnormal   Collection Time: 12/15/18  3:10 PM   Specimen: Urine, Clean Catch  Result Value Ref Range Status   Specimen Description URINE, CLEAN CATCH  Final   Special Requests NONE  Final   Culture (A)  Final    <10,000 COLONIES/mL INSIGNIFICANT GROWTH  Performed at [facility], [address], [place], [place] [address]    Report Status 12/16/2018 FINAL  Final      Radiology Studies: No results found.   Scheduled Meds: . enoxaparin (LOVENOX) injection  40 mg Subcutaneous Q24H  . folic acid  1 mg Oral Daily  . LORazepam  1 mg Intravenous Once  . multivitamin with minerals  1 tablet Oral Daily  . nicotine  21 mg Transdermal Daily   Continuous Infusions: . sodium chloride 100 mL/hr at 12/20/18 1047  . thiamine injection 250 mg (12/20/18 1049)     LOS: 7 days   Time Spent in minutes   30 minutes  Shashwat Cleary D.O. on 12/20/2018 at 11:23 AM  Between 7am to 7pm - Please see pager noted on [email or web address]  After 7pm go to [email or web address]  And look for the night coverage person covering for me after hours  [facility] Office  [phone]

## 2018-12-21 LAB — CBC
HCT: 33.6 % — ABNORMAL LOW (ref 36.0–46.0)
Hemoglobin: 11.7 g/dL — ABNORMAL LOW (ref 12.0–15.0)
MCH: 29.8 pg (ref 26.0–34.0)
MCHC: 34.8 g/dL (ref 30.0–36.0)
MCV: 85.7 fL (ref 80.0–100.0)
Platelets: 242 10*3/uL (ref 150–400)
RBC: 3.92 MIL/uL (ref 3.87–5.11)
RDW: 14.5 % (ref 11.5–15.5)
WBC: 7.4 10*3/uL (ref 4.0–10.5)
nRBC: 0 % (ref 0.0–0.2)

## 2018-12-21 LAB — BASIC METABOLIC PANEL
Anion gap: 9 (ref 5–15)
BUN: 7 mg/dL — ABNORMAL LOW (ref 8–23)
CO2: 20 mmol/L — ABNORMAL LOW (ref 22–32)
Calcium: 8.5 mg/dL — ABNORMAL LOW (ref 8.9–10.3)
Chloride: 110 mmol/L (ref 98–111)
Creatinine, Ser: 0.61 mg/dL (ref 0.44–1.00)
GFR calc Af Amer: >60 mL/min (ref >60)
GFR calc non Af Amer: >60 mL/min (ref >60)
Glucose, Bld: 93 mg/dL (ref 70–99)
Potassium: 3.3 mmol/L — ABNORMAL LOW (ref 3.5–5.1)
Sodium: 139 mmol/L (ref 135–145)

## 2018-12-21 LAB — VITAMIN B1: Vitamin B1 (Thiamine): 330.3 nmol/L — ABNORMAL HIGH (ref 66.5–200.0)

## 2018-12-21 LAB — MAGNESIUM: Magnesium: 1.8 mg/dL (ref 1.7–2.4)

## 2018-12-21 MED ORDER — POTASSIUM CHLORIDE CRYS ER 20 MEQ PO TBCR
40.0000 meq | EXTENDED_RELEASE_TABLET | ORAL | Status: AC
  Administered 2018-12-21 (×2): 40 meq via ORAL
  Filled 2018-12-21 (×2): qty 2

## 2018-12-21 MED ORDER — THIAMINE HCL 100 MG PO TABS
100.0000 mg | ORAL_TABLET | Freq: Every day | ORAL | Status: DC
  Administered 2018-12-22 – 2019-05-18 (×147): 100 mg via ORAL
  Filled 2018-12-21 (×151): qty 1

## 2018-12-21 NOTE — Progress Notes (Signed)
Increased confusion this afternoon. Needing frequent reorientation and diversion. Patient stating "I need to call my boss to tell him I won't be at work today." Nurse, learning disability regular contacts with patient and will continue to observe throughout day.

## 2018-12-21 NOTE — Progress Notes (Signed)
Physical Therapy Treatment Patient Details Name: Marie Woods MRN: [id number] DOB: Jun 01, 1955 Today's Date: 12/21/2018    History of Present Illness Patient is a 63 year old female with medical history significant of asthma, hypertension, alcohol abuse who was brought in by EMS after neighbors called in stating that patient is confused and altered. Patient admitted Severe Sepsis with Acute metabolic encephalopathy secondary to UTI.     PT Comments    Pt was able to transfer sit>stand and ambulate around bed in room with +1 assist & RW on this date without any L knee instability noted. Pt declines further attempts at ambulation as she reports she wants to nap. Pt will require 24 hr supervision at d/c 2/2 cognitive & physical deficits. Will continue to follow acutely to focus on transfers, gait & balance to increase pt's independence prior to d/c.    Follow Up Recommendations  SNF;Supervision/Assistance - 24 hour     Equipment Recommendations  Rolling walker with 5" wheels    Recommendations for Other Services       Precautions / Restrictions Precautions Precautions: Fall Precaution Comments: was on contact for bed bugs, not on on 11/16 Restrictions Weight Bearing Restrictions: No    Mobility  Bed Mobility                  Transfers Overall transfer level: Needs assistance Equipment used: Rolling walker (2 wheeled) Transfers: Sit to/from Stand Sit to Stand: Min assist         General transfer comment: pt requires 2 attempts and cuing to push up with BUE for sit>stand from EOB  Ambulation/Gait Ambulation/Gait assistance: Min assist Gait Distance (Feet): 15 Feet Assistive device: Rolling walker (2 wheeled) Gait Pattern/deviations: Step-through pattern;Decreased stride length;Trunk flexed;Wide base of support Gait velocity: decreased   General Gait Details: pt with L knee brace but does not attempt to position it correctly or use it despite encouragement, pt  with B knee hyperextension during gait   Stairs             Wheelchair Mobility    Modified Rankin (Stroke Patients Only)       Balance Overall balance assessment: Needs assistance Sitting-balance support: Feet supported Sitting balance-Leahy Scale: Good Sitting balance - Comments: static sitting balance EOB without LOB   Standing balance support: Bilateral upper extremity supported;During functional activity Standing balance-Leahy Scale: Poor Standing balance comment: BUE support on RW                            Cognition Arousal/Alertness: Awake/alert Behavior During Therapy: WFL for tasks assessed/performed Overall Cognitive Status: No family/caregiver present to determine baseline cognitive functioning Area of Impairment: Following commands;Safety/judgement;Awareness;Attention;Memory;Problem solving;Orientation                     Memory: Decreased short-term memory Following Commands: Follows one step commands with increased time Safety/Judgement: Decreased awareness of safety;Decreased awareness of deficits            Exercises      General Comments General comments (skin integrity, edema, etc.): pt does not have any c/o LLE knee pain & no buckling noted, though BLE knees appear to hyperextend during gait. After ambulating around bed to recliner pt declines further ambulation trials & begins phone conversation.      Pertinent Vitals/Pain Pain Assessment: 0-10 Pain Score: 2  Pain Location: back Pain Descriptors / Indicators: Aching Pain Intervention(s): Monitored during session(pt declines asking  for pain meds)    Home Living                      Prior Function            PT Goals (current goals can now be found in the care plan section) Acute Rehab PT Goals Patient Stated Goal: to go home PT Goal Formulation: With patient Time For Goal Achievement: 12/29/18 Potential to Achieve Goals: Good Progress towards PT  goals: Progressing toward goals    Frequency    Min 3X/week      PT Plan Current plan remains appropriate    Co-evaluation              AM-PAC PT "6 Clicks" Mobility   Outcome Measure  Help needed turning from your back to your side while in a flat bed without using bedrails?: None Help needed moving from lying on your back to sitting on the side of a flat bed without using bedrails?: None Help needed moving to and from a bed to a chair (including a wheelchair)?: A Little Help needed standing up from a chair using your arms (e.g., wheelchair or bedside chair)?: A Little Help needed to walk in hospital room?: A Little Help needed climbing 3-5 steps with a railing? : A Lot 6 Click Score: 19    End of Session Equipment Utilized During Treatment: Gait belt Activity Tolerance: Patient tolerated treatment well Patient left: in chair;with call bell/phone within reach;with chair alarm set Nurse Communication: Mobility status PT Visit Diagnosis: Other abnormalities of gait and mobility (R26.89);Muscle weakness (generalized) (M62.81);Difficulty in walking, not elsewhere classified (R26.2)     Time: 7902-4097 PT Time Calculation (min) (ACUTE ONLY): 11 min  Charges:  $Therapeutic Activity: 8-22 mins                         Sandi Mariscal, PT, DPT 12/21/2018, 3:47 PM

## 2018-12-21 NOTE — Progress Notes (Signed)
Patient ID: Marie Congressdith D Hatler, female   DOB: 08-Jan-1956, 63 y.o.   MRN: [id number]  PROGRESS NOTE    Marie Woods  [id number] DOB: 08-Jan-1956 DOA: 12/13/2018 PCP: Marcine MatarJohnson, Deborah B, MD   Brief Narrative:  63 year old female with history of asthma, hypertension, alcohol abuse was brought to the hospital confused and altered.  She was found to have UTI; urine culture showed multiple species.  MRI brain was suggestive of possible Wernicke's encephalopathy.  Currently receiving IV thiamine.  PT recommended SNF placement.  Assessment & Plan:   Severe sepsis: Present on admission UTI -Urine culture showed multiple species.  Repeat culture showed less than 10,000 colonies.  Was initially treated with IV Rocephin and fluids.  Subsequently antibiotics have been discontinued. -Currently more dynamically stable.  Sepsis has resolved.  Chest x-ray was negative for infection.  Acute metabolic encephalopathy Possible Wernicke's encephalopathy -CT of the head showed mild atrophic changes without acute intracranial abnormality -MRI of the brain: Symmetric diffusion weighted and T2/FLAIR hyperintense signal abnormality within the medial thalami.  Findings favor sequelae of Wernicke's encephalopathy There was question of patient having alcohol abuse however alcohol level less than 10.  Urine drug screen negative -Patient was started on high-dose thiamine. -Discussed with neurology on 12/16/2018, Dr. Laurence SlateAroor, recommended thiamine 500 mg 3 times daily for 3 days then followed by 100 mg daily. -Of note vitamin B 1 level ordered however patient has been on thiamine since admission, therefore level may not be accurate. -Discussed with Dr. Otelia LimesLindzen on 12/19/2018, recommended continuing thiamine IV 250 mg twice daily for 3 days and then transition to 100 mg oral daily.  Likely [facility] -Outpatient follow-up with neurology. -Mental status only very slowly improving.  Difficult to keep  patient focused and needing to be reoriented frequently.  Essential hypertension -Blood pressure stable.  Amlodipine on hold.  Hypokalemia  -replace.  Repeat a.m. labs  History of alcohol abuse -Continue folate, thiamine and multivitamin.  Continue CIWA protocol  History of tobacco abuse -Continue nicotine patch  Generalized deconditioning Bedbug -Bedbug found on patient: Continue contact precautions -PT/OT recommending SNF.  Social worker following. -Feel that patient does need supervision as she appears to have short-term memory impairment.  We discussed the reason for her hospitalization on a daily basis but she does not remember the next day.  DVT prophylaxis: Lovenox Code Status: Full Family Communication: None at bedside Disposition Plan: SNF once bed is available  Consultants: Neurology via phone  Procedures: None  Antimicrobials:  Anti-infectives (From admission, onward)   Start     Dose/Rate Route Frequency Ordered Stop   12/14/18 0100  cefTRIAXone (ROCEPHIN) 1 g in sodium chloride 0.9 % 100 mL IVPB  Status:  Discontinued     1 g 200 mL/hr over 30 Minutes Intravenous Every 24 hours 12/14/18 0010 12/17/18 0948       Subjective: Patient seen and examined at bedside.  She is awake but a poor historian.  No overnight fever, vomiting or seizure-like activities reported.  Objective: Vitals:   12/20/18 0517 12/20/18 1000 12/20/18 2147 12/21/18 0537  BP: 126/86 140/86 140/80 136/79  Pulse: 77 76 80 85  Resp: 20 18  20   Temp: 98 F (36.7 C) 97.9 F (36.6 C) 98.4 F (36.9 C) 98.2 F (36.8 C)  TempSrc: Oral Oral Oral Oral  SpO2: 100% 100% 99% 99%  Weight:      Height:        Intake/Output Summary (Last 24 hours)  at 12/21/2018 1426 Last data filed at 12/21/2018 1110 Gross per 24 hour  Intake 530 ml  Output 1150 ml  Net -620 ml   Filed Weights   12/15/18 0101  Weight: 98.4 kg    Examination:  General exam: Looks chronically ill.  Awake but poor  historian and does not answer much questions. Respiratory system: Bilateral decreased breath sounds at bases Cardiovascular system: S1 & S2 heard, Rate controlled Gastrointestinal system: Abdomen is nondistended, soft and nontender. Normal bowel sounds heard. Extremities: No cyanosis, clubbing, edema    Data Reviewed: I have personally reviewed following labs and imaging studies  CBC: Recent Labs  Lab 12/15/18 0303 12/16/18 0631 12/21/18 0303  WBC 13.3* 9.5 7.4  HGB 14.9 13.6 11.7*  HCT 43.4 40.8 33.6*  MCV 86.8 89.9 85.7  PLT 247 168 242   Basic Metabolic Panel: Recent Labs  Lab 12/15/18 0303 12/15/18 1009 12/20/18 0505 12/21/18 0303  NA 139  --   --  139  K 3.6  --   --  3.3*  CL 105  --   --  110  CO2 20*  --   --  20*  GLUCOSE 98  --   --  93  BUN 17  --   --  7*  CREATININE 0.86  --  0.58 0.61  CALCIUM 9.5  --   --  8.5*  MG  --  2.4  --  1.8  PHOS  --  4.1  --   --    GFR: Estimated Creatinine Clearance: 89 mL/min (by C-G formula based on SCr of 0.61 mg/dL). Liver Function Tests: No results for input(s): AST, ALT, ALKPHOS, BILITOT, PROT, ALBUMIN in the last 168 hours. No results for input(s): LIPASE, AMYLASE in the last 168 hours. No results for input(s): AMMONIA in the last 168 hours. Coagulation Profile: No results for input(s): INR, PROTIME in the last 168 hours. Cardiac Enzymes: No results for input(s): CKTOTAL, CKMB, CKMBINDEX, TROPONINI in the last 168 hours. BNP (last 3 results) No results for input(s): PROBNP in the last 8760 hours. HbA1C: No results for input(s): HGBA1C in the last 72 hours. CBG: No results for input(s): GLUCAP in the last 168 hours. Lipid Profile: No results for input(s): CHOL, HDL, LDLCALC, TRIG, CHOLHDL, LDLDIRECT in the last 72 hours. Thyroid Function Tests: No results for input(s): TSH, T4TOTAL, FREET4, T3FREE, THYROIDAB in the last 72 hours. Anemia Panel: No results for input(s): VITAMINB12, FOLATE, FERRITIN, TIBC,  IRON, RETICCTPCT in the last 72 hours. Sepsis Labs: Recent Labs  Lab 12/15/18 0303  LATICACIDVEN 1.7    Recent Results (from the past 240 hour(s))  Urine culture     Status: Abnormal   Collection Time: 12/13/18  9:00 PM   Specimen: Urine, Random  Result Value Ref Range Status   Specimen Description URINE, RANDOM  Final   Special Requests   Final    ADDED 0444 12/14/2018 Performed at [facility], [address] [place], [place] [address]    Culture MULTIPLE SPECIES PRESENT, SUGGEST RECOLLECTION (A)  Final   Report Status 12/15/2018 FINAL  Final  SARS CORONAVIRUS 2 (TAT 6-24 HRS) Nasopharyngeal     Status: None   Collection Time: 12/13/18  9:58 PM   Specimen: Nasopharyngeal  Result Value Ref Range Status   SARS Coronavirus 2 NEGATIVE NEGATIVE Final    Comment: (NOTE) SARS-CoV-2 target nucleic acids are NOT DETECTED. The SARS-CoV-2 RNA is generally detectable in upper and lower respiratory specimens during  the acute phase of infection. Negative results do not preclude SARS-CoV-2 infection, do not rule out co-infections with other pathogens, and should not be used as the sole basis for treatment or other patient management decisions. Negative results must be combined with clinical observations, patient history, and epidemiological information. The expected result is Negative. Fact Sheet for Patients: SugarRoll.be Fact Sheet for Healthcare Providers: https://www.woods-mathews.com/ This test is not yet approved or cleared by the [place] FDA and  has been authorized for detection and/or diagnosis of SARS-CoV-2 by FDA under an Emergency Use Authorization (EUA). This EUA will remain  in effect (meaning this test can be used) for the duration of the COVID-19 declaration under Section 56 4(b)(1) of the Act, 21 U.S.C. section 360bbb-3(b)(1), unless the authorization is terminated or revoked sooner. Performed at [facility], [place] [address], [place], [place] [address]   Culture, Urine     Status: Abnormal   Collection Time: 12/15/18  3:10 PM   Specimen: Urine, Clean Catch  Result Value Ref Range Status   Specimen Description URINE, CLEAN CATCH  Final   Special Requests NONE  Final   Culture (A)  Final    <10,000 COLONIES/mL INSIGNIFICANT GROWTH Performed at [facility], [place] [address] [place], [place] [address]    Report Status 12/16/2018 FINAL  Final         Radiology Studies: No results found.      Scheduled Meds: . enoxaparin (LOVENOX) injection  40 mg Subcutaneous Q24H  . folic acid  1 mg Oral Daily  . LORazepam  1 mg Intravenous Once  . multivitamin with minerals  1 tablet Oral Daily  . nicotine  21 mg Transdermal Daily  . [START ON 12/22/2018] thiamine  100 mg Oral Daily   Continuous Infusions: . sodium chloride 100 mL/hr at 12/21/18 1021  . thiamine injection 250 mg (12/21/18 1022)          Aline August, MD [facility] 12/21/2018, 2:26 PM

## 2018-12-21 NOTE — TOC Progression Note (Signed)
Transition of Care [facility]) - Progression Note    Patient Details  Name: Marie Woods MRN: [id number] Date of Birth: August 22, 1955  Transition of Care [facility]) CM/SW [place], LCSW Phone Number: 12/21/2018, 1:33 PM  Clinical Narrative:    Patient does not have any LOG SNF bed offers. CSW uploaded requested progress note to [facility]. Per [employer], they are monitoring patient for Medicaid eligibility.    Expected Discharge Plan: [place] Barriers to Discharge: Continued Medical Work up, SNF Pending payor source - LOG  Expected Discharge Plan and Services Expected Discharge Plan: [place] In-house Referral: Clinical Social Work Discharge Planning Services: NA Post Acute Care Choice: [place] Living arrangements for the past 2 months: Apartment                 DME Arranged: N/A DME Agency: NA                   Social Determinants of Health (SDOH) Interventions    Readmission Risk Interventions No flowsheet data found.

## 2018-12-22 MED ORDER — AMLODIPINE BESYLATE 10 MG PO TABS
10.0000 mg | ORAL_TABLET | Freq: Every day | ORAL | Status: DC
  Administered 2018-12-22 – 2018-12-24 (×3): 10 mg via ORAL
  Filled 2018-12-22 (×4): qty 1

## 2018-12-22 NOTE — Progress Notes (Signed)
:  Unable to locate nicotine patch to remove.  New patch applied.

## 2018-12-22 NOTE — Progress Notes (Signed)
10:20 Spoke w/ pt's nurse about IV start request No IV meds noted on MAR.[name] RN will check to see why pt. Needs IV and update IV team.

## 2018-12-22 NOTE — Progress Notes (Signed)
[name], female   DOB: 1955/04/28, 63 y.o.   MRN: [id number]  PROGRESS NOTE    Marie Woods  [id number] DOB: 10/08/1955 DOA: 12/13/2018 PCP: Marie Matar, MD   Brief Narrative:  63 year old female with history of asthma, hypertension, alcohol abuse was brought to the hospital confused and altered.  She was found to have UTI; urine culture showed multiple species.  MRI brain was suggestive of possible Wernicke's encephalopathy.  Treated with thiamine.  PT recommended SNF placement.  Assessment & Plan:   Severe sepsis: Present on admission UTI -Urine culture showed multiple species.  Repeat culture showed less than 10,000 colonies.  Was initially treated with IV Rocephin and fluids.  Subsequently antibiotics have been discontinued. -Currently more dynamically stable.  Sepsis has resolved.  Chest x-ray was negative for infection.  Acute metabolic encephalopathy Possible Wernicke's encephalopathy -CT of the head showed mild atrophic changes without acute intracranial abnormality -MRI of the brain: Symmetric diffusion weighted and T2/FLAIR hyperintense signal abnormality within the medial thalami.  Findings favor sequelae of Wernicke's encephalopathy There was question of patient having alcohol abuse however alcohol level less than 10.  Urine drug screen negative -Patient was started on high-dose thiamine. -Discussed with neurology on 12/16/2018, Dr. Laurence Slate, recommended thiamine 500 mg 3 times daily for 3 days then followed by 100 mg daily. -Of note vitamin B 1 level ordered however patient has been on thiamine since admission, therefore level may not be accurate. -Discussed with Dr. Otelia Limes on 12/19/2018, recommended continuing thiamine IV 250 mg twice daily for 3 days and then transition to 100 mg oral daily.  Likely has memory impairment due to [facility]. -Outpatient follow-up with neurology. -Mental status slowly improving.  Difficult to keep patient focused and  needing to be reoriented frequently.  Essential hypertension -Blood pressure intermittently on the higher side.  Will restart amlodipine  History of alcohol abuse -Continue folate, thiamine and multivitamin.  Continue CIWA protocol  History of tobacco abuse -Continue nicotine patch  Generalized deconditioning Bedbug -Bedbug found on patient: Continue contact precautions -PT/OT recommending SNF.  Social worker following. -Feel that patient does need supervision as she appears to have short-term memory impairment.  We discuss the reason for her hospitalization on a daily basis but she does not remember the next day.  DVT prophylaxis: Lovenox Code Status: Full Family Communication: None at bedside Disposition Plan: SNF once bed is available  Consultants: Neurology via phone  Procedures: None  Antimicrobials:  Anti-infectives (From admission, onward)   Start     Dose/Rate Route Frequency Ordered Stop   12/14/18 0100  cefTRIAXone (ROCEPHIN) 1 g in sodium chloride 0.9 % 100 mL IVPB  Status:  Discontinued     1 g 200 mL/hr over 30 Minutes Intravenous Every 24 hours 12/14/18 0010 12/17/18 0948       Subjective: Patient seen and examined at bedside.  Very poor historian.  Awake, pleasantly confused.  No fever, vomiting or seizures reported  objective: Vitals:   12/21/18 1435 12/21/18 2024 12/21/18 2031 12/22/18 0503  BP: (!) 148/87 (!) 146/102 (!) 141/81 123/79  Pulse: 78 82 79 87  Resp: 18   14  Temp: 98.3 F (36.8 C) (!) 97.3 F (36.3 C)  98.6 F (37 C)  TempSrc: Oral Oral  Oral  SpO2: 100% 95%  100%  Weight:      Height:        Intake/Output Summary (Last 24 hours) at 12/22/2018 0803 Last data filed at 12/22/2018  [id number] Gross per 24 hour  Intake -  Output 1350 ml  Net -1350 ml   Filed Weights   12/15/18 0101  Weight: 98.4 kg    Examination:  General exam: No acute distress.  Looks chronically ill.  Awake, pleasantly confused; very poor historian.    Respiratory system: Bilateral decreased breath sounds at bases, no wheezing Cardiovascular system: Rate controlled, S1-S2 heard Gastrointestinal system: Abdomen is nondistended, soft and nontender. Normal bowel sounds heard. Extremities: No cyanosis, edema    Data Reviewed: I have personally reviewed following labs and imaging studies  CBC: Recent Labs  Lab 12/16/18 0631 12/21/18 0303  WBC 9.5 7.4  HGB 13.6 11.7*  HCT 40.8 33.6*  MCV 89.9 85.7  PLT 168 242   Basic Metabolic Panel: Recent Labs  Lab 12/15/18 1009 12/20/18 0505 12/21/18 0303  NA  --   --  139  K  --   --  3.3*  CL  --   --  110  CO2  --   --  20*  GLUCOSE  --   --  93  BUN  --   --  7*  CREATININE  --  0.58 0.61  CALCIUM  --   --  8.5*  MG 2.4  --  1.8  PHOS 4.1  --   --    GFR: Estimated Creatinine Clearance: 89 mL/min (by C-G formula based on SCr of 0.61 mg/dL). Liver Function Tests: No results for input(s): AST, ALT, ALKPHOS, BILITOT, PROT, ALBUMIN in the last 168 hours. No results for input(s): LIPASE, AMYLASE in the last 168 hours. No results for input(s): AMMONIA in the last 168 hours. Coagulation Profile: No results for input(s): INR, PROTIME in the last 168 hours. Cardiac Enzymes: No results for input(s): CKTOTAL, CKMB, CKMBINDEX, TROPONINI in the last 168 hours. BNP (last 3 results) No results for input(s): PROBNP in the last 8760 hours. HbA1C: No results for input(s): HGBA1C in the last 72 hours. CBG: No results for input(s): GLUCAP in the last 168 hours. Lipid Profile: No results for input(s): CHOL, HDL, LDLCALC, TRIG, CHOLHDL, LDLDIRECT in the last 72 hours. Thyroid Function Tests: No results for input(s): TSH, T4TOTAL, FREET4, T3FREE, THYROIDAB in the last 72 hours. Anemia Panel: No results for input(s): VITAMINB12, FOLATE, FERRITIN, TIBC, IRON, RETICCTPCT in the last 72 hours. Sepsis Labs: No results for input(s): PROCALCITON, LATICACIDVEN in the last 168 hours.  Recent Results  (from the past 240 hour(s))  Urine culture     Status: Abnormal   Collection Time: 12/13/18  9:00 PM   Specimen: Urine, Random  Result Value Ref Range Status   Specimen Description URINE, RANDOM  Final   Special Requests   Final    ADDED 0444 12/14/2018 Performed at [facility], [address] [place], [place] [address]    Culture MULTIPLE SPECIES PRESENT, SUGGEST RECOLLECTION (A)  Final   Report Status 12/15/2018 FINAL  Final  SARS CORONAVIRUS 2 (TAT 6-24 HRS) Nasopharyngeal     Status: None   Collection Time: 12/13/18  9:58 PM   Specimen: Nasopharyngeal  Result Value Ref Range Status   SARS Coronavirus 2 NEGATIVE NEGATIVE Final    Comment: (NOTE) SARS-CoV-2 target nucleic acids are NOT DETECTED. The SARS-CoV-2 RNA is generally detectable in upper and lower respiratory specimens during the acute phase of infection. Negative results do not preclude SARS-CoV-2 infection, do not rule out co-infections with other pathogens, and should not be used as the sole basis for treatment  or other patient management decisions. Negative results must be combined with clinical observations, patient history, and epidemiological information. The expected result is Negative. Fact Sheet for Patients: SugarRoll.be Fact Sheet for Healthcare Providers: https://www.woods-mathews.com/ This test is not yet approved or cleared by the [place] FDA and  has been authorized for detection and/or diagnosis of SARS-CoV-2 by FDA under an Emergency Use Authorization (EUA). This EUA will remain  in effect (meaning this test can be used) for the duration of the COVID-19 declaration under Section 56 4(b)(1) of the Act, 21 U.S.C. section 360bbb-3(b)(1), unless the authorization is terminated or revoked sooner. Performed at [facility], [place] [address] [place], [place] [address]   Culture, Urine     Status: Abnormal   Collection Time: 12/15/18  3:10 PM    Specimen: Urine, Clean Catch  Result Value Ref Range Status   Specimen Description URINE, CLEAN CATCH  Final   Special Requests NONE  Final   Culture (A)  Final    <10,000 COLONIES/mL INSIGNIFICANT GROWTH Performed at [facility], [place] [address] [place], [place] [address]    Report Status 12/16/2018 FINAL  Final         Radiology Studies: No results found.      Scheduled Meds: . enoxaparin (LOVENOX) injection  40 mg Subcutaneous Q24H  . folic acid  1 mg Oral Daily  . multivitamin with minerals  1 tablet Oral Daily  . nicotine  21 mg Transdermal Daily  . thiamine  100 mg Oral Daily   Continuous Infusions:         Aline August, MD [facility] 12/22/2018, 8:03 AM

## 2018-12-23 DIAGNOSIS — G9341 Metabolic encephalopathy: Secondary | ICD-10-CM

## 2018-12-23 MED ORDER — TRAMADOL HCL 50 MG PO TABS
50.0000 mg | ORAL_TABLET | Freq: Four times a day (QID) | ORAL | Status: DC | PRN
  Administered 2018-12-23 – 2019-04-21 (×73): 50 mg via ORAL
  Filled 2018-12-23 (×79): qty 1

## 2018-12-23 MED ORDER — IBUPROFEN 400 MG PO TABS
400.0000 mg | ORAL_TABLET | Freq: Four times a day (QID) | ORAL | Status: DC | PRN
  Administered 2018-12-23 – 2019-01-05 (×11): 400 mg via ORAL
  Filled 2018-12-23 (×11): qty 1

## 2018-12-23 NOTE — Progress Notes (Signed)
Responded to consult for spiritual support. Pt was alert and brother was at bedside. Marie Woods was open to talk. She mentioned her time here was ok. Also, she said that she knows what she needs to do for her health in terms of her diet habbits, but its hard for her. I gave her space to process it and she said that her diet has not been good and blames herself for it. We discussed how she could seek encouragement from her family support. I was pastorally present and offered her and her brother spiritual care with empathic listening, words of encouragement, and ministry of presence. Chaplain available as needed.    Chaplain Resident  Fidel Levy [phone]

## 2018-12-23 NOTE — Progress Notes (Signed)
This RN notified Blount,NP that patient did not have an IV since pt had pulled hers out earlier today as reported during shift change. Throughout her admission, the patient has pulled out 8 IVs. Per IV team policy, IVs are not replaced if the patient is not receiving any IV medications. At this time, patient does not have an IV and Blount,NP is aware.

## 2018-12-23 NOTE — Progress Notes (Signed)
[name], female   DOB: 09/23/1955, 63 y.o.   MRN: [id number]  PROGRESS NOTE    Marie Woods  BZJ:[id number] DOB: Jun 09, 1955 DOA: 12/13/2018 PCP: Ladell Pier, MD   Brief Narrative:  63 year old female with history of asthma, hypertension, alcohol abuse was brought to the hospital confused and altered.  She was found to have UTI; urine culture showed multiple species.  MRI brain was suggestive of possible Wernicke's encephalopathy.  Treated with thiamine.  PT recommended SNF placement.  Assessment & Plan:   Severe sepsis: Present on admission UTI -Urine culture showed multiple species.  Repeat culture showed less than 10,000 colonies.  Was initially treated with IV Rocephin and fluids.  Subsequently antibiotics have been discontinued. -Currently hemodynamically stable.  Sepsis has resolved.  Chest x-ray was negative for infection.  Acute metabolic encephalopathy Possible Wernicke's encephalopathy -CT of the head showed mild atrophic changes without acute intracranial abnormality -MRI of the brain: Symmetric diffusion weighted and T2/FLAIR hyperintense signal abnormality within the medial thalami.  Findings favor sequelae of Wernicke's encephalopathy There was question of patient having alcohol abuse however alcohol level less than 10.  Urine drug screen negative -Patient was started on high-dose thiamine. -Discussed with neurology on 12/16/2018, Dr. Lorraine Lax, recommended thiamine 500 mg 3 times daily for 3 days then followed by 100 mg daily. -Of note vitamin B 1 level ordered however patient has been on thiamine since admission, therefore level may not be accurate. -Discussed with Dr. Cheral Marker on 12/19/2018, recommended continuing thiamine IV 250 mg twice daily for 3 days and then transition to 100 mg oral daily.  Likely has memory impairment due to [facility]. -Outpatient follow-up with neurology. -Mental status slowly improving.  Difficult to keep patient focused and  needing to be reoriented frequently.  Essential hypertension -Blood pressure better now.  Continue amlodipine  History of alcohol abuse -Continue folate, thiamine and multivitamin.  Continue CIWA protocol  History of tobacco abuse -Continue nicotine patch  Generalized deconditioning Bedbug -Bedbug found on patient: Continue contact precautions -PT/OT recommending SNF.  Social worker following. -Feel that patient does need supervision as she appears to have short-term memory impairment.  We discuss the reason for her hospitalization on a daily basis but she does not remember the next day.  DVT prophylaxis: Lovenox Code Status: Full Family Communication: None at bedside Disposition Plan: SNF once bed is available  Consultants: Neurology via phone  Procedures: None  Antimicrobials:  Anti-infectives (From admission, onward)   Start     Dose/Rate Route Frequency Ordered Stop   12/14/18 0100  cefTRIAXone (ROCEPHIN) 1 g in sodium chloride 0.9 % 100 mL IVPB  Status:  Discontinued     1 g 200 mL/hr over 30 Minutes Intravenous Every 24 hours 12/14/18 0010 12/17/18 0948       Subjective: Patient seen and examined at bedside.  Very poor historian.  No fever, vomiting, worsening shortness of breath reported by nursing staff.  Awake but pleasantly confused  objective: Vitals:   12/22/18 0503 12/22/18 1245 12/22/18 2108 12/23/18 0605  BP: 123/79 136/86 130/84 114/78  Pulse: 87 89 89 90  Resp: 14 18    Temp: 98.6 F (37 C) 98.9 F (37.2 C) 97.9 F (36.6 C) 98.6 F (37 C)  TempSrc: Oral Oral Oral Oral  SpO2: 100% 100% 100% 100%  Weight:      Height:        Intake/Output Summary (Last 24 hours) at 12/23/2018 0749 Last data filed at 12/22/2018  1245 Gross per 24 hour  Intake 470 ml  Output 300 ml  Net 170 ml   Filed Weights   12/15/18 0101  Weight: 98.4 kg    Examination:  General exam: No distress.  Looks chronically ill.  Awake but still pleasantly confused.  Very  poor historian.   Respiratory system: Bilateral decreased breath sounds at bases Cardiovascular system: S1-S2 heard, rate controlled Gastrointestinal system: Abdomen is nondistended, soft and nontender. Normal bowel sounds heard. Extremities: No cyanosis, edema    Data Reviewed: I have personally reviewed following labs and imaging studies  CBC: Recent Labs  Lab 12/21/18 0303  WBC 7.4  HGB 11.7*  HCT 33.6*  MCV 85.7  PLT 242   Basic Metabolic Panel: Recent Labs  Lab 12/20/18 0505 12/21/18 0303  NA  --  139  K  --  3.3*  CL  --  110  CO2  --  20*  GLUCOSE  --  93  BUN  --  7*  CREATININE 0.58 0.61  CALCIUM  --  8.5*  MG  --  1.8   GFR: Estimated Creatinine Clearance: 89 mL/min (by C-G formula based on SCr of 0.61 mg/dL). Liver Function Tests: No results for input(s): AST, ALT, ALKPHOS, BILITOT, PROT, ALBUMIN in the last 168 hours. No results for input(s): LIPASE, AMYLASE in the last 168 hours. No results for input(s): AMMONIA in the last 168 hours. Coagulation Profile: No results for input(s): INR, PROTIME in the last 168 hours. Cardiac Enzymes: No results for input(s): CKTOTAL, CKMB, CKMBINDEX, TROPONINI in the last 168 hours. BNP (last 3 results) No results for input(s): PROBNP in the last 8760 hours. HbA1C: No results for input(s): HGBA1C in the last 72 hours. CBG: No results for input(s): GLUCAP in the last 168 hours. Lipid Profile: No results for input(s): CHOL, HDL, LDLCALC, TRIG, CHOLHDL, LDLDIRECT in the last 72 hours. Thyroid Function Tests: No results for input(s): TSH, T4TOTAL, FREET4, T3FREE, THYROIDAB in the last 72 hours. Anemia Panel: No results for input(s): VITAMINB12, FOLATE, FERRITIN, TIBC, IRON, RETICCTPCT in the last 72 hours. Sepsis Labs: No results for input(s): PROCALCITON, LATICACIDVEN in the last 168 hours.  Recent Results (from the past 240 hour(s))  Urine culture     Status: Abnormal   Collection Time: 12/13/18  9:00 PM    Specimen: Urine, Random  Result Value Ref Range Status   Specimen Description URINE, RANDOM  Final   Special Requests   Final    ADDED 0444 12/14/2018 Performed at [facility], [address] [place], [place] [address]    Culture MULTIPLE SPECIES PRESENT, SUGGEST RECOLLECTION (A)  Final   Report Status 12/15/2018 FINAL  Final  SARS CORONAVIRUS 2 (TAT 6-24 HRS) Nasopharyngeal     Status: None   Collection Time: 12/13/18  9:58 PM   Specimen: Nasopharyngeal  Result Value Ref Range Status   SARS Coronavirus 2 NEGATIVE NEGATIVE Final    Comment: (NOTE) SARS-CoV-2 target nucleic acids are NOT DETECTED. The SARS-CoV-2 RNA is generally detectable in upper and lower respiratory specimens during the acute phase of infection. Negative results do not preclude SARS-CoV-2 infection, do not rule out co-infections with other pathogens, and should not be used as the sole basis for treatment or other patient management decisions. Negative results must be combined with clinical observations, patient history, and epidemiological information. The expected result is Negative. Fact Sheet for Patients: HairSlick.nohttps://www.fda.gov/media/138098/download Fact Sheet for Healthcare Providers: quierodirigir.comhttps://www.fda.gov/media/138095/download This test is not yet approved or cleared  by the [place] and  has been authorized for detection and/or diagnosis of SARS-CoV-2 by FDA under an Emergency Use Authorization (EUA). This EUA will remain  in effect (meaning this test can be used) for the duration of the COVID-19 declaration under Section 56 4(b)(1) of the Act, 21 U.S.C. section 360bbb-3(b)(1), unless the authorization is terminated or revoked sooner. Performed at [facility], [address], [place], [place] [address]   Culture, Urine     Status: Abnormal   Collection Time: 12/15/18  3:10 PM   Specimen: Urine, Clean Catch  Result Value Ref Range Status   Specimen Description URINE, CLEAN CATCH   Final   Special Requests NONE  Final   Culture (A)  Final    <10,000 COLONIES/mL INSIGNIFICANT GROWTH Performed at [facility], [address], [place], [place] [address]    Report Status 12/16/2018 FINAL  Final         Radiology Studies: No results found.      Scheduled Meds: . amLODipine  10 mg Oral Daily  . enoxaparin (LOVENOX) injection  40 mg Subcutaneous Q24H  . folic acid  1 mg Oral Daily  . multivitamin with minerals  1 tablet Oral Daily  . nicotine  21 mg Transdermal Daily  . thiamine  100 mg Oral Daily   Continuous Infusions:         Glade Lloyd, MD [facility] 12/23/2018, 7:49 AM

## 2018-12-23 NOTE — Progress Notes (Signed)
Physical Therapy Treatment Patient Details Name: Marie Woods MRN: [id number] DOB: 17-Aug-1955 Today's Date: 12/23/2018    History of Present Illness Patient is a 63 year old female with medical history significant of asthma, hypertension, alcohol abuse who was brought in by EMS after neighbors called in stating that patient is confused and altered. Patient admitted Severe Sepsis with Acute metabolic encephalopathy secondary to UTI.     PT Comments    Patient received in bathroom unattended. She requires cues and assistance for safety with mobility. Confused. Ambulated 20 feet in room with RW and min assist. Patient reaching for bed, counter, instead of using walker. Leaving walker behind, wide BOS, feet outside of RW when walking. Patient will benefit from continued skilled PT to improve safety with mobility and educate on use of AD.     Follow Up Recommendations  SNF;Supervision/Assistance - 24 hour     Equipment Recommendations  Rolling walker with 5" wheels    Recommendations for Other Services       Precautions / Restrictions Precautions Precautions: Fall Precaution Comments: was on contact for bed bugs, not on on 11/16 Restrictions Weight Bearing Restrictions: No    Mobility  Bed Mobility Overal bed mobility: Needs Assistance Bed Mobility: Supine to Sit;Sit to Supine     Supine to sit: Min guard Sit to supine: Min guard      Transfers Overall transfer level: Needs assistance Equipment used: Rolling walker (2 wheeled) Transfers: Sit to/from Stand Sit to Stand: Min assist            Ambulation/Gait Ambulation/Gait assistance: Min Chemical engineer (Feet): 20 Feet Assistive device: Rolling walker (2 wheeled) Gait Pattern/deviations: Wide base of support;Step-through pattern;Decreased stride length Gait velocity: decreased   General Gait Details: patient requires cues for safety with RW, feet outside RW, reaching for counter, bed to steady. Not  keeping RW with her.  Patient ambulated to bathroom on her own without AD, when I went to room she was in the bathroom unattended.   Stairs             Wheelchair Mobility    Modified Rankin (Stroke Patients Only)       Balance Overall balance assessment: Needs assistance Sitting-balance support: Feet supported Sitting balance-Leahy Scale: Fair     Standing balance support: Bilateral upper extremity supported;During functional activity Standing balance-Leahy Scale: Poor Standing balance comment: BUE support on RW                            Cognition Arousal/Alertness: Awake/alert Behavior During Therapy: WFL for tasks assessed/performed Overall Cognitive Status: No family/caregiver present to determine baseline cognitive functioning Area of Impairment: Safety/judgement;Problem solving;Awareness                 Orientation Level: Disoriented to;Time;Situation Current Attention Level: Sustained Memory: Decreased short-term memory Following Commands: Follows one step commands consistently Safety/Judgement: Decreased awareness of safety;Decreased awareness of deficits Awareness: Emergent Problem Solving: Difficulty sequencing;Requires verbal cues General Comments: patient thinks she has to be at work tonight. Asking about a heater that was in the room.      Exercises      General Comments        Pertinent Vitals/Pain Pain Assessment: Faces Faces Pain Scale: Hurts even more Pain Location: headache Pain Descriptors / Indicators: Aching Pain Intervention(s): Monitored during session;Patient requesting pain meds-RN notified    Home Living  Prior Function            PT Goals (current goals can now be found in the care plan section) Acute Rehab PT Goals Patient Stated Goal: to go home PT Goal Formulation: With patient Time For Goal Achievement: 12/29/18 Potential to Achieve Goals: Fair Progress towards PT  goals: Progressing toward goals    Frequency    Min 3X/week      PT Plan Current plan remains appropriate    Co-evaluation              AM-PAC PT "6 Clicks" Mobility   Outcome Measure  Help needed turning from your back to your side while in a flat bed without using bedrails?: None Help needed moving from lying on your back to sitting on the side of a flat bed without using bedrails?: None Help needed moving to and from a bed to a chair (including a wheelchair)?: A Little Help needed standing up from a chair using your arms (e.g., wheelchair or bedside chair)?: A Little Help needed to walk in hospital room?: A Lot Help needed climbing 3-5 steps with a railing? : A Lot 6 Click Score: 18    End of Session Equipment Utilized During Treatment: Gait belt Activity Tolerance: Patient tolerated treatment well Patient left: in bed;with bed alarm set;with call bell/phone within reach Nurse Communication: Mobility status PT Visit Diagnosis: Other abnormalities of gait and mobility (R26.89);Muscle weakness (generalized) (M62.81);Difficulty in walking, not elsewhere classified (R26.2)     Time: 7867-6720 PT Time Calculation (min) (ACUTE ONLY): 15 min  Charges:  $Gait Training: 8-22 mins                     Shyanne Mcclary, PT, GCS 12/23/18,4:38 PM

## 2018-12-24 LAB — GLUCOSE, CAPILLARY: Glucose-Capillary: 87 mg/dL (ref 70–99)

## 2018-12-24 NOTE — Progress Notes (Signed)
[name], female   DOB: 07/14/1955, 63 y.o.   MRN: [id number]  PROGRESS NOTE    Marie Woods  HRC:[id number] DOB: [birth year] DOA: 12/13/2018 PCP: Marcine Matar, MD   Brief Narrative:  63 year old female with history of asthma, hypertension, alcohol abuse was brought to the hospital confused and altered.  She was found to have UTI; urine culture showed multiple species.  MRI brain was suggestive of possible Wernicke's encephalopathy.  Treated with thiamine.  PT recommended SNF placement.  Assessment & Plan:   Severe sepsis: Present on admission UTI -Urine culture showed multiple species.  Repeat culture showed less than 10,000 colonies.  Was initially treated with IV Rocephin and fluids.  Subsequently antibiotics have been discontinued. -Currently hemodynamically stable.  Sepsis has resolved.  Chest x-ray was negative for infection.  Acute metabolic encephalopathy Possible Wernicke's encephalopathy -CT of the head showed mild atrophic changes without acute intracranial abnormality -MRI of the brain: Symmetric diffusion weighted and T2/FLAIR hyperintense signal abnormality within the medial thalami.  Findings favor sequelae of Wernicke's encephalopathy There was question of patient having alcohol abuse however alcohol level less than 10.  Urine drug screen negative -Patient was started on high-dose thiamine. -Discussed with neurology on 12/16/2018, Dr. Laurence Slate, recommended thiamine 500 mg 3 times daily for 3 days then followed by 100 mg daily. -Of note vitamin B 1 level ordered however patient has been on thiamine since admission, therefore level may not be accurate. -Discussed with Dr. Otelia Limes on 12/19/2018, recommended continuing thiamine IV 250 mg twice daily for 3 days and then transition to 100 mg oral daily.  Likely has memory impairment due to [facility]. -Outpatient follow-up with neurology. -Mental status slowly improving.  Difficult to keep patient focused and  needing to be reoriented frequently.  Essential hypertension -Blood pressure better now.  Continue amlodipine  History of alcohol abuse -Continue folate, thiamine and multivitamin.  Continue CIWA protocol  History of tobacco abuse -Continue nicotine patch  Generalized deconditioning Bedbug -Bedbug found on patient: Continue contact precautions -PT/OT recommending SNF.  Social worker following. -Feel that patient does need supervision as she appears to have short-term memory impairment.  We discuss the reason for her hospitalization on a daily basis but she does not remember the next day.  DVT prophylaxis: Lovenox Code Status: Full Family Communication: None at bedside Disposition Plan: SNF once bed is available  Consultants: Neurology via phone  Procedures: None  Antimicrobials:  Anti-infectives (From admission, onward)   Start     Dose/Rate Route Frequency Ordered Stop   12/14/18 0100  cefTRIAXone (ROCEPHIN) 1 g in sodium chloride 0.9 % 100 mL IVPB  Status:  Discontinued     1 g 200 mL/hr over 30 Minutes Intravenous Every 24 hours 12/14/18 0010 12/17/18 0948       Subjective: Patient seen and examined at bedside.  Sleepy, hardly wakes up on calling her name.  No overnight fever, vomiting or agitation reported nursing staff.    Objective: Vitals:   12/23/18 1300 12/23/18 2119 12/24/18 0503 12/24/18 0701  BP:  125/81 (!) 85/61 105/64  Pulse: 95 89 79 79  Resp: 20     Temp: 98 F (36.7 C) 97.6 F (36.4 C) 98.1 F (36.7 C)   TempSrc: Oral Oral Oral   SpO2: 100% 100% 98%   Weight:      Height:        Intake/Output Summary (Last 24 hours) at 12/24/2018 0803 Last data filed at 12/23/2018 1700 Gross  per 24 hour  Intake 600 ml  Output 450 ml  Net 150 ml   Filed Weights   12/15/18 0101  Weight: 98.4 kg    Examination:  General exam: No acute distress.  Looks chronically ill.  Sleepy.   Respiratory system: Bilateral decreased breath sounds at bases, no  wheezing Cardiovascular system: Rate controlled, S1-S2 heard Gastrointestinal system: Abdomen is nondistended, soft and nontender. Normal bowel sounds heard. Extremities: No cyanosis, edema    Data Reviewed: I have personally reviewed following labs and imaging studies  CBC: Recent Labs  Lab 12/21/18 0303  WBC 7.4  HGB 11.7*  HCT 33.6*  MCV 85.7  PLT 518   Basic Metabolic Panel: Recent Labs  Lab 12/20/18 0505 12/21/18 0303  NA  --  139  K  --  3.3*  CL  --  110  CO2  --  20*  GLUCOSE  --  93  BUN  --  7*  CREATININE 0.58 0.61  CALCIUM  --  8.5*  MG  --  1.8   GFR: Estimated Creatinine Clearance: 89 mL/min (by C-G formula based on SCr of 0.61 mg/dL). Liver Function Tests: No results for input(s): AST, ALT, ALKPHOS, BILITOT, PROT, ALBUMIN in the last 168 hours. No results for input(s): LIPASE, AMYLASE in the last 168 hours. No results for input(s): AMMONIA in the last 168 hours. Coagulation Profile: No results for input(s): INR, PROTIME in the last 168 hours. Cardiac Enzymes: No results for input(s): CKTOTAL, CKMB, CKMBINDEX, TROPONINI in the last 168 hours. BNP (last 3 results) No results for input(s): PROBNP in the last 8760 hours. HbA1C: No results for input(s): HGBA1C in the last 72 hours. CBG: No results for input(s): GLUCAP in the last 168 hours. Lipid Profile: No results for input(s): CHOL, HDL, LDLCALC, TRIG, CHOLHDL, LDLDIRECT in the last 72 hours. Thyroid Function Tests: No results for input(s): TSH, T4TOTAL, FREET4, T3FREE, THYROIDAB in the last 72 hours. Anemia Panel: No results for input(s): VITAMINB12, FOLATE, FERRITIN, TIBC, IRON, RETICCTPCT in the last 72 hours. Sepsis Labs: No results for input(s): PROCALCITON, LATICACIDVEN in the last 168 hours.  Recent Results (from the past 240 hour(s))  Culture, Urine     Status: Abnormal   Collection Time: 12/15/18  3:10 PM   Specimen: Urine, Clean Catch  Result Value Ref Range Status   Specimen  Description URINE, CLEAN CATCH  Final   Special Requests NONE  Final   Culture (A)  Final    <10,000 COLONIES/mL INSIGNIFICANT GROWTH Performed at [facility], [address], [place], [place] [address]    Report Status 12/16/2018 FINAL  Final         Radiology Studies: No results found.      Scheduled Meds: . amLODipine  10 mg Oral Daily  . enoxaparin (LOVENOX) injection  40 mg Subcutaneous Q24H  . folic acid  1 mg Oral Daily  . multivitamin with minerals  1 tablet Oral Daily  . nicotine  21 mg Transdermal Daily  . thiamine  100 mg Oral Daily   Continuous Infusions:         Aline August, MD [facility] 12/24/2018, 8:03 AM

## 2018-12-25 DIAGNOSIS — R21 Rash and other nonspecific skin eruption: Secondary | ICD-10-CM

## 2018-12-25 MED ORDER — LORATADINE 10 MG PO TABS
10.0000 mg | ORAL_TABLET | Freq: Every day | ORAL | Status: DC | PRN
  Administered 2018-12-27 – 2019-04-16 (×20): 10 mg via ORAL
  Filled 2018-12-25 (×21): qty 1

## 2018-12-25 MED ORDER — CLOBETASOL PROPIONATE 0.05 % EX CREA
TOPICAL_CREAM | Freq: Two times a day (BID) | CUTANEOUS | Status: DC
  Administered 2018-12-25: 1 via TOPICAL
  Administered 2018-12-26 – 2018-12-29 (×5): via TOPICAL
  Administered 2018-12-30: 1 via TOPICAL
  Administered 2018-12-31 – 2019-01-02 (×6): via TOPICAL
  Administered 2019-01-03: 1 via TOPICAL
  Filled 2018-12-25 (×2): qty 15

## 2018-12-25 NOTE — Progress Notes (Signed)
[name], female   DOB: 1955/08/20, 63 y.o.   MRN: [id number]  PROGRESS NOTE    MATILYNN DACEY  GLO:[id number] DOB: 10/16/1955 DOA: 12/13/2018 PCP: Ladell Pier, MD   Brief Narrative:  63 year old female with history of asthma, hypertension, alcohol abuse was brought to the hospital confused and altered.  She was found to have UTI; urine culture showed multiple species.  MRI brain was suggestive of possible Wernicke's encephalopathy.  Treated with thiamine.  PT recommended SNF placement.  Assessment & Plan:   Severe sepsis: Present on admission UTI -Urine culture showed multiple species.  Repeat culture showed less than 10,000 colonies.  Was initially treated with IV Rocephin and fluids.  Subsequently antibiotics have been discontinued. -Currently afebrile and hemodynamically stable.  Sepsis has resolved.  Chest x-ray was negative for infection.  Acute metabolic encephalopathy Possible Wernicke's encephalopathy -CT of the head showed mild atrophic changes without acute intracranial abnormality -MRI of the brain: Symmetric diffusion weighted and T2/FLAIR hyperintense signal abnormality within the medial thalami.  Findings favor sequelae of Wernicke's encephalopathy There was question of patient having alcohol abuse however alcohol level less than 10.  Urine drug screen negative -Patient was started on high-dose thiamine. -Discussed with neurology on 12/16/2018, Dr. Lorraine Lax, recommended thiamine 500 mg 3 times daily for 3 days then followed by 100 mg daily. -Of note vitamin B 1 level ordered however patient has been on thiamine since admission, therefore level may not be accurate. -Discussed with Dr. Cheral Marker on 12/19/2018, recommended continuing thiamine IV 250 mg twice daily for 3 days and then transition to 100 mg oral daily.  Likely has memory impairment due to [facility]. -Outpatient follow-up with neurology. -Mental status slowly improving.  Difficult to keep patient  focused and needing to be reoriented frequently.  Essential hypertension -Blood pressure on the lower side.  Will hold amlodipine.  Rash -Complains of itching and rash on her chin and wrists.  She also has a rash on her right side of the abdomen, looks like psoriasis.  Patient states that it is new but not sure if that is the case.  Will use topical steroid cream and oral antiallergic medication.  Will need outpatient dermatology evaluation.  History of alcohol abuse -Continue folate, thiamine and multivitamin.  Continue CIWA protocol  History of tobacco abuse -Continue nicotine patch  Generalized deconditioning Bedbug -Bedbug found on patient on presentation. -PT/OT recommending SNF.  Social worker following. -Feel that patient does need supervision as she appears to have short-term memory impairment.  We discuss the reason for her hospitalization on a daily basis but she does not remember the next day.  DVT prophylaxis: Lovenox Code Status: Full Family Communication: None at bedside Disposition Plan: SNF once bed is available  Consultants: Neurology via phone  Procedures: None  Antimicrobials:  Anti-infectives (From admission, onward)   Start     Dose/Rate Route Frequency Ordered Stop   12/14/18 0100  cefTRIAXone (ROCEPHIN) 1 g in sodium chloride 0.9 % 100 mL IVPB  Status:  Discontinued     1 g 200 mL/hr over 30 Minutes Intravenous Every 24 hours 12/14/18 0010 12/17/18 0948       Subjective: Patient seen and examined at bedside.  Very poor historian.  No overnight agitation, fever or vomiting reported.  Complains of itching over her chin and bilateral wrists.  Also complains of rash in the same area and her abdomen.  Objective: Vitals:   12/24/18 0503 12/24/18 0701 12/24/18 1000 12/24/18 2125  BP: (!) 85/61 105/64 125/90 105/74  Pulse: 79 79 84 90  Resp:   18   Temp: 98.1 F (36.7 C)  97.8 F (36.6 C) 97.9 F (36.6 C)  TempSrc: Oral  Oral Oral  SpO2: 98%  100%  100%  Weight:      Height:       No intake or output data in the 24 hours ending 12/25/18 0748 Filed Weights   12/15/18 0101  Weight: 98.4 kg    Examination:  General exam: No distress.  Looks chronically ill.  Still slightly confused. Respiratory system: Bilateral decreased breath sounds at bases, no wheezing Cardiovascular system: Rate controlled, S1-S2 heard Gastrointestinal system: Abdomen is nondistended, soft and nontender. Normal bowel sounds heard. Extremities: No cyanosis, edema  Skin: Papular rash around the chin and bilateral wrists.  Also scaly rash on her right side of the abdomen   Data Reviewed: I have personally reviewed following labs and imaging studies  CBC: Recent Labs  Lab 12/21/18 0303  WBC 7.4  HGB 11.7*  HCT 33.6*  MCV 85.7  PLT 242   Basic Metabolic Panel: Recent Labs  Lab 12/20/18 0505 12/21/18 0303  NA  --  139  K  --  3.3*  CL  --  110  CO2  --  20*  GLUCOSE  --  93  BUN  --  7*  CREATININE 0.58 0.61  CALCIUM  --  8.5*  MG  --  1.8   GFR: Estimated Creatinine Clearance: 89 mL/min (by C-G formula based on SCr of 0.61 mg/dL). Liver Function Tests: No results for input(s): AST, ALT, ALKPHOS, BILITOT, PROT, ALBUMIN in the last 168 hours. No results for input(s): LIPASE, AMYLASE in the last 168 hours. No results for input(s): AMMONIA in the last 168 hours. Coagulation Profile: No results for input(s): INR, PROTIME in the last 168 hours. Cardiac Enzymes: No results for input(s): CKTOTAL, CKMB, CKMBINDEX, TROPONINI in the last 168 hours. BNP (last 3 results) No results for input(s): PROBNP in the last 8760 hours. HbA1C: No results for input(s): HGBA1C in the last 72 hours. CBG: Recent Labs  Lab 12/24/18 1634  GLUCAP 87   Lipid Profile: No results for input(s): CHOL, HDL, LDLCALC, TRIG, CHOLHDL, LDLDIRECT in the last 72 hours. Thyroid Function Tests: No results for input(s): TSH, T4TOTAL, FREET4, T3FREE, THYROIDAB in the last  72 hours. Anemia Panel: No results for input(s): VITAMINB12, FOLATE, FERRITIN, TIBC, IRON, RETICCTPCT in the last 72 hours. Sepsis Labs: No results for input(s): PROCALCITON, LATICACIDVEN in the last 168 hours.  Recent Results (from the past 240 hour(s))  Culture, Urine     Status: Abnormal   Collection Time: 12/15/18  3:10 PM   Specimen: Urine, Clean Catch  Result Value Ref Range Status   Specimen Description URINE, CLEAN CATCH  Final   Special Requests NONE  Final   Culture (A)  Final    <10,000 COLONIES/mL INSIGNIFICANT GROWTH Performed at [facility], [address], [place], [place] [address]    Report Status 12/16/2018 FINAL  Final         Radiology Studies: No results found.      Scheduled Meds: . amLODipine  10 mg Oral Daily  . enoxaparin (LOVENOX) injection  40 mg Subcutaneous Q24H  . folic acid  1 mg Oral Daily  . multivitamin with minerals  1 tablet Oral Daily  . nicotine  21 mg Transdermal Daily  . thiamine  100 mg Oral Daily  Continuous Infusions:         Glade LloydKshitiz Samariyah Cowles, MD [facility] 12/25/2018, 7:48 AM

## 2018-12-26 LAB — CBC WITH DIFFERENTIAL/PLATELET
Abs Immature Granulocytes: 0.03 10*3/uL (ref 0.00–0.07)
Basophils Absolute: 0 10*3/uL (ref 0.0–0.1)
Basophils Relative: 1 %
Eosinophils Absolute: 0.2 10*3/uL (ref 0.0–0.5)
Eosinophils Relative: 3 %
HCT: 36.5 % (ref 36.0–46.0)
Hemoglobin: 12.2 g/dL (ref 12.0–15.0)
Immature Granulocytes: 1 %
Lymphocytes Relative: 22 %
Lymphs Abs: 1.3 10*3/uL (ref 0.7–4.0)
MCH: 29.1 pg (ref 26.0–34.0)
MCHC: 33.4 g/dL (ref 30.0–36.0)
MCV: 87.1 fL (ref 80.0–100.0)
Monocytes Absolute: 0.6 10*3/uL (ref 0.1–1.0)
Monocytes Relative: 11 %
Neutro Abs: 3.7 10*3/uL (ref 1.7–7.7)
Neutrophils Relative %: 62 %
Platelets: 285 10*3/uL (ref 150–400)
RBC: 4.19 MIL/uL (ref 3.87–5.11)
RDW: 15.3 % (ref 11.5–15.5)
WBC: 5.9 10*3/uL (ref 4.0–10.5)
nRBC: 0 % (ref 0.0–0.2)

## 2018-12-26 LAB — COMPREHENSIVE METABOLIC PANEL
ALT: 23 U/L (ref 0–44)
AST: 26 U/L (ref 15–41)
Albumin: 3.2 g/dL — ABNORMAL LOW (ref 3.5–5.0)
Alkaline Phosphatase: 32 U/L — ABNORMAL LOW (ref 38–126)
Anion gap: 8 (ref 5–15)
BUN: 8 mg/dL (ref 8–23)
CO2: 25 mmol/L (ref 22–32)
Calcium: 9.4 mg/dL (ref 8.9–10.3)
Chloride: 106 mmol/L (ref 98–111)
Creatinine, Ser: 0.65 mg/dL (ref 0.44–1.00)
GFR calc Af Amer: >60 mL/min (ref >60)
GFR calc non Af Amer: >60 mL/min (ref >60)
Glucose, Bld: 95 mg/dL (ref 70–99)
Potassium: 3.8 mmol/L (ref 3.5–5.1)
Sodium: 139 mmol/L (ref 135–145)
Total Bilirubin: 1.2 mg/dL (ref 0.3–1.2)
Total Protein: 6.6 g/dL (ref 6.5–8.1)

## 2018-12-26 LAB — MAGNESIUM: Magnesium: 2 mg/dL (ref 1.7–2.4)

## 2018-12-26 NOTE — Progress Notes (Signed)
[name], female   DOB: [birth year], 63 y.o.   MRN: [id number]  PROGRESS NOTE    Marie Woods  WIO:[id number] DOB: May 13, 1955 DOA: 12/13/2018 PCP: Marcine Matar, MD   Brief Narrative:  63 year old female with history of asthma, hypertension, alcohol abuse was brought to the hospital confused and altered.  She was found to have UTI; urine culture showed multiple species.  MRI brain was suggestive of possible Wernicke's encephalopathy.  Treated with thiamine.  PT recommended SNF placement.  Assessment & Plan:   Severe sepsis: Present on admission UTI -Urine culture showed multiple species.  Repeat culture showed less than 10,000 colonies.  Was initially treated with IV Rocephin and fluids.  Subsequently antibiotics have been discontinued. -Currently afebrile and hemodynamically stable.  Sepsis has resolved.  Chest x-ray was negative for infection.  Acute metabolic encephalopathy Possible Wernicke's encephalopathy -CT of the head showed mild atrophic changes without acute intracranial abnormality -MRI of the brain: Symmetric diffusion weighted and T2/FLAIR hyperintense signal abnormality within the medial thalami.  Findings favor sequelae of Wernicke's encephalopathy There was question of patient having alcohol abuse however alcohol level less than 10.  Urine drug screen negative -Patient was started on high-dose thiamine. -Discussed with neurology on 12/16/2018, Dr. Laurence Slate, recommended thiamine 500 mg 3 times daily for 3 days then followed by 100 mg daily. -Of note vitamin B 1 level ordered however patient has been on thiamine since admission, therefore level may not be accurate. -Discussed with Dr. Otelia Limes on 12/19/2018, recommended continuing thiamine IV 250 mg twice daily for 3 days and then transition to 100 mg oral daily.  Likely has memory impairment due to [facility]. -Outpatient follow-up with neurology. -Mental status slowly improving.  Difficult to keep patient  focused and needing to be reoriented frequently.  Essential hypertension -Blood pressure on the lower side.  Will hold amlodipine.  Rash -Complains of itching and rash on her chin and wrists.  She also has a rash on her right side of the abdomen, looks like psoriasis.  Patient states that it is new but not sure if that is the case.  Will use topical steroid cream and oral antiallergic medication.  Will need outpatient dermatology evaluation.  History of alcohol abuse -Continue folate, thiamine and multivitamin.  Continue CIWA protocol  History of tobacco abuse -Continue nicotine patch  Generalized deconditioning Bedbug -Bedbug found on patient on presentation. -PT/OT recommending SNF.  Social worker following. -Feel that patient does need supervision as she appears to have short-term memory impairment.  We discuss the reason for her hospitalization on a daily basis but she does not remember the next day.  DVT prophylaxis: Lovenox Code Status: Full Family Communication: None at bedside Disposition Plan: SNF once bed is available  Consultants: Neurology via phone  Procedures: None  Antimicrobials:  Anti-infectives (From admission, onward)   Start     Dose/Rate Route Frequency Ordered Stop   12/14/18 0100  cefTRIAXone (ROCEPHIN) 1 g in sodium chloride 0.9 % 100 mL IVPB  Status:  Discontinued     1 g 200 mL/hr over 30 Minutes Intravenous Every 24 hours 12/14/18 0010 12/17/18 0948       Subjective: Patient seen and examined at bedside.  Very poor historian.  No overnight agitation, fever or vomiting reported.  Objective: Vitals:   12/25/18 0912 12/25/18 1219 12/25/18 2136 12/26/18 0539  BP: (!) 116/92 128/87 110/73 125/77  Pulse: 87 84 88 80  Resp:  19 16 16   Temp: 98.2  F (36.8 C) 98.6 F (37 C) 98.3 F (36.8 C) 98.5 F (36.9 C)  TempSrc: Oral Oral Oral Oral  SpO2: 100% 100% 100% 100%  Weight:      Height:        Intake/Output Summary (Last 24 hours) at  12/26/2018 0755 Last data filed at 12/25/2018 1400 Gross per 24 hour  Intake 630 ml  Output -  Net 630 ml   Filed Weights   12/15/18 0101  Weight: 98.4 kg    Examination:  General exam: No acute distress.  Looks chronically ill.  Slightly confused. Respiratory system: Bilateral decreased breath sounds at bases, some scattered crackles Cardiovascular system: S1-S2 heard, rate controlled Gastrointestinal system: Abdomen is nondistended, soft and nontender. Normal bowel sounds heard. Extremities: No cyanosis, edema  Skin: Papular rash around the chin and bilateral wrists.  Also scaly rash on her right side of the abdomen   Data Reviewed: I have personally reviewed following labs and imaging studies  CBC: Recent Labs  Lab 12/21/18 0303 12/26/18 0248  WBC 7.4 5.9  NEUTROABS  --  3.7  HGB 11.7* 12.2  HCT 33.6* 36.5  MCV 85.7 87.1  PLT 242 354   Basic Metabolic Panel: Recent Labs  Lab 12/20/18 0505 12/21/18 0303 12/26/18 0248  NA  --  139 139  K  --  3.3* 3.8  CL  --  110 106  CO2  --  20* 25  GLUCOSE  --  93 95  BUN  --  7* 8  CREATININE 0.58 0.61 0.65  CALCIUM  --  8.5* 9.4  MG  --  1.8 2.0   GFR: Estimated Creatinine Clearance: 89 mL/min (by C-G formula based on SCr of 0.65 mg/dL). Liver Function Tests: Recent Labs  Lab 12/26/18 0248  AST 26  ALT 23  ALKPHOS 32*  BILITOT 1.2  PROT 6.6  ALBUMIN 3.2*   No results for input(s): LIPASE, AMYLASE in the last 168 hours. No results for input(s): AMMONIA in the last 168 hours. Coagulation Profile: No results for input(s): INR, PROTIME in the last 168 hours. Cardiac Enzymes: No results for input(s): CKTOTAL, CKMB, CKMBINDEX, TROPONINI in the last 168 hours. BNP (last 3 results) No results for input(s): PROBNP in the last 8760 hours. HbA1C: No results for input(s): HGBA1C in the last 72 hours. CBG: Recent Labs  Lab 12/24/18 1634  GLUCAP 87   Lipid Profile: No results for input(s): CHOL, HDL, LDLCALC,  TRIG, CHOLHDL, LDLDIRECT in the last 72 hours. Thyroid Function Tests: No results for input(s): TSH, T4TOTAL, FREET4, T3FREE, THYROIDAB in the last 72 hours. Anemia Panel: No results for input(s): VITAMINB12, FOLATE, FERRITIN, TIBC, IRON, RETICCTPCT in the last 72 hours. Sepsis Labs: No results for input(s): PROCALCITON, LATICACIDVEN in the last 168 hours.  No results found for this or any previous visit (from the past 240 hour(s)).       Radiology Studies: No results found.      Scheduled Meds: . clobetasol cream   Topical BID  . enoxaparin (LOVENOX) injection  40 mg Subcutaneous Q24H  . folic acid  1 mg Oral Daily  . multivitamin with minerals  1 tablet Oral Daily  . nicotine  21 mg Transdermal Daily  . thiamine  100 mg Oral Daily   Continuous Infusions:         Aline August, MD [facility] 12/26/2018, 7:55 AM

## 2018-12-26 NOTE — TOC Progression Note (Signed)
Transition of Care [facility]) - Progression Note    Patient Details  Name: Marie Woods MRN: [id number] Date of Birth: 1955-05-09  Transition of Care [facility]) CM/SW [place], [place] Phone Number: 12/26/2018, 1:20 PM  Clinical Narrative:    Patient still awaiting SNF bed offer and remains on [facility] waitlist.    Expected Discharge Plan: [place] Barriers to Discharge: Continued Medical Work up, SNF Pending payor source - LOG  Expected Discharge Plan and Services Expected Discharge Plan: [place] In-house Referral: Clinical Social Work Discharge Planning Services: NA Post Acute Care Choice: [place] Living arrangements for the past 2 months: Apartment                 DME Arranged: N/A DME Agency: NA                   Social Determinants of Health ([place]) Interventions    Readmission Risk Interventions No flowsheet data found.

## 2018-12-26 NOTE — Progress Notes (Signed)
Physical Therapy Treatment Patient Details Name: Marie Woods MRN: [id number] DOB: [birth year] Today's Date: 12/26/2018    History of Present Illness Patient is a 63 year old female with medical history significant of asthma, hypertension, alcohol abuse who was brought in by EMS after neighbors called in stating that patient is confused and altered. Patient admitted Severe Sepsis with Acute metabolic encephalopathy secondary to UTI.     PT Comments    Patient seen for mobility progression. Pt continues to present with cognitive and balance deficits increasing risk for falls. Continue to progress as tolerated.    Follow Up Recommendations  SNF;Supervision/Assistance - 24 hour     Equipment Recommendations  Rolling walker with 5" wheels    Recommendations for Other Services       Precautions / Restrictions Precautions Precautions: Fall Precaution Comments: was on contact for bed bugs, not on on 11/16    Mobility  Bed Mobility               General bed mobility comments: pt sitting EOB upon arrival  Transfers Overall transfer level: Needs assistance Equipment used: 1 person hand held assist;Rolling walker (2 wheeled) Transfers: Sit to/from Stand Sit to Stand: Mod assist         General transfer comment: cues for positioning and hand placement; assistance to power up into standing   Ambulation/Gait Ambulation/Gait assistance: Min assist;Mod assist Gait Distance (Feet): 30 Feet Assistive device: Rolling walker (2 wheeled);1 person hand held assist Gait Pattern/deviations: Wide base of support;Step-through pattern;Decreased stride length;Trunk flexed Gait velocity: decreased   General Gait Details: mod A to ambulate with HHA and pt reaching for objects with other hand to hold onto; there was no RW in room initially; max cues and assistance for safe use of RW; assistance for balance with increased assist for turns    Stairs             Wheelchair  Mobility    Modified Rankin (Stroke Patients Only)       Balance Overall balance assessment: Needs assistance Sitting-balance support: Feet supported Sitting balance-Leahy Scale: Good     Standing balance support: Bilateral upper extremity supported;During functional activity Standing balance-Leahy Scale: Poor                              Cognition Arousal/Alertness: Awake/alert Behavior During Therapy: WFL for tasks assessed/performed Overall Cognitive Status: No family/caregiver present to determine baseline cognitive functioning Area of Impairment: Safety/judgement;Problem solving;Orientation;Attention;Following commands                 Orientation Level: Disoriented to;Situation;Place Current Attention Level: Sustained Memory: Decreased short-term memory Following Commands: Follows one step commands consistently Safety/Judgement: Decreased awareness of safety;Decreased awareness of deficits   Problem Solving: Difficulty sequencing;Requires verbal cues General Comments: pt asking several times to "go out to the bench and smoke"; pt reports she does not use AD at home but looking for a cane in the room; pt able to recall the date but believed she is in a "retirement home"      Exercises      General Comments        Pertinent Vitals/Pain Pain Assessment: No/denies pain    Home Living                      Prior Function            PT Goals (current goals can  now be found in the care plan section) Progress towards PT goals: Progressing toward goals    Frequency    Min 3X/week      PT Plan Current plan remains appropriate    Co-evaluation              AM-PAC PT "6 Clicks" Mobility   Outcome Measure  Help needed turning from your back to your side while in a flat bed without using bedrails?: None Help needed moving from lying on your back to sitting on the side of a flat bed without using bedrails?: None Help needed  moving to and from a bed to a chair (including a wheelchair)?: A Little Help needed standing up from a chair using your arms (e.g., wheelchair or bedside chair)?: A Little Help needed to walk in hospital room?: A Lot Help needed climbing 3-5 steps with a railing? : A Lot 6 Click Score: 18    End of Session Equipment Utilized During Treatment: Gait belt Activity Tolerance: Patient tolerated treatment well Patient left: with call bell/phone within reach;in chair;with chair alarm set Nurse Communication: Mobility status PT Visit Diagnosis: Other abnormalities of gait and mobility (R26.89);Muscle weakness (generalized) (M62.81);Difficulty in walking, not elsewhere classified (R26.2)     Time: 2119-4174 PT Time Calculation (min) (ACUTE ONLY): 17 min  Charges:  $Gait Training: 8-22 mins                     Earney Navy, PTA Acute Rehabilitation Services Pager: [phone] Office: [phone]     Marie Woods 12/26/2018, 4:07 PM

## 2018-12-27 LAB — CREATININE, SERUM
Creatinine, Ser: 0.58 mg/dL (ref 0.44–1.00)
GFR calc Af Amer: >60 mL/min (ref >60)
GFR calc non Af Amer: >60 mL/min (ref >60)

## 2018-12-27 NOTE — Progress Notes (Signed)
Occupational Therapy Treatment Patient Details Name: Marie Woods MRN: [id number] DOB: 1955-06-11 Today's Date: 12/27/2018    History of present illness Patient is a 63 year old female with medical history significant of asthma, hypertension, alcohol abuse who was brought in by EMS after neighbors called in stating that patient is confused and altered. Patient admitted Severe Sepsis with Acute metabolic encephalopathy secondary to UTI.    OT comments  Upon first attempt patient seated on toilet with patient requesting more time. Second attempt PTA present in patient's room with PTA and CNA stating they have tried getting patient off the toilet. Patient had not gotten up since OT's first attempt which was over 2 hrs prior. With max encouragement from therapy patient require mod A to stand from toilet to see if patient had a bowel movement. Patient tremulous and stood for approximately 1 min before sitting back down while wash clothes being prepared for hygiene. On second sit to stand from toilet patient min/mod A using bilateral upper extremities to pull on grab bars to stand. Total A for hygiene due to fatigue and unsteadiness. Patient min A x2 to ambulate to sink to perform grooming/hygiene with min guard in standing with patient holding onto sink for stability. Patient utilize rolling walker for remaining functional ambulation requiring min A x1-2 and verbal cues for safety managing walker.    Follow Up Recommendations  SNF;Supervision/Assistance - 24 hour    Equipment Recommendations  Other (comment)(defer to next venue)       Precautions / Restrictions Precautions Precautions: Fall Precaution Comments: was on contact for bed bugs, not on on 11/16 Restrictions Weight Bearing Restrictions: No       Mobility Transfers Overall transfer level: Needs assistance Equipment used: Rolling walker (2 wheeled) Transfers: Sit to/from Stand Sit to Stand: Min assist;Mod assist          General transfer comment: verbal cues for hand placement    Balance Overall balance assessment: Needs assistance Sitting-balance support: Feet supported Sitting balance-Leahy Scale: Good Sitting balance - Comments: static sitting balance EOB without LOB   Standing balance support: Bilateral upper extremity supported;During functional activity Standing balance-Leahy Scale: Poor Standing balance comment: BUE support on RW                           ADL either performed or assessed with clinical judgement   ADL Overall ADL's : Needs assistance/impaired     Grooming: Min guard;Wash/dry hands;Wash/dry face;Oral care;Standing Grooming Details (indicate cue type and reason): holding onto sink for support, mild tremors                 Toilet Transfer: Minimal assistance;Moderate assistance;Ambulation;Grab bars Toilet Transfer Details (indicate cue type and reason): due to patient sitting so long on toilet require mod A with first sit to stand and use of grab bar. second sit to stand from toilet patient min/mod with B UE use of grab bars Toileting- Clothing Manipulation and Hygiene: Total assistance;Sit to/from stand Toileting - Clothing Manipulation Details (indicate cue type and reason): for peri care after small bowel movement due to weakness/limited dynamic standing balance     Functional mobility during ADLs: Minimal assistance General ADL Comments: decreased safety with use of rolling walker               Cognition Arousal/Alertness: Awake/alert Behavior During Therapy: WFL for tasks assessed/performed Overall Cognitive Status: No family/caregiver present to determine baseline cognitive functioning Area of Impairment:  Memory;Safety/judgement;Awareness;Orientation                 Orientation Level: Disoriented to;Time;Situation   Memory: Decreased short-term memory   Safety/Judgement: Decreased awareness of safety;Decreased awareness of  deficits Awareness: Emergent   General Comments: patient unaware she had been sitting on toilet for 2 hours trying to have bowel movement                   Pertinent Vitals/ Pain       Pain Assessment: No/denies pain         Frequency  Min 2X/week        Progress Toward Goals  OT Goals(current goals can now be found in the care plan section)  Progress towards OT goals: Progressing toward goals  Acute Rehab OT Goals Patient Stated Goal: "to have coffee" OT Goal Formulation: With patient Time For Goal Achievement: 01/10/19 ADL Goals Pt Will Perform Lower Body Dressing: with modified independence;sit to/from stand Pt Will Transfer to Toilet: with modified independence;ambulating;regular height toilet Pt Will Perform Toileting - Clothing Manipulation and hygiene: with modified independence;sit to/from stand Additional ADL Goal #1: Patient will complete medication management tasks with zero errors in 2/3 trials.  Plan Discharge plan remains appropriate    Co-evaluation    PT/OT/SLP Co-Evaluation/Treatment: Yes Reason for Co-Treatment: To address functional/ADL transfers   OT goals addressed during session: ADL's and self-care      AM-PAC OT "6 Clicks" Daily Activity     Outcome Measure   Help from another person eating meals?: None Help from another person taking care of personal grooming?: A Little Help from another person toileting, which includes using toliet, bedpan, or urinal?: A Lot Help from another person bathing (including washing, rinsing, drying)?: A Lot Help from another person to put on and taking off regular upper body clothing?: A Little Help from another person to put on and taking off regular lower body clothing?: A Lot 6 Click Score: 16    End of Session Equipment Utilized During Treatment: Gait belt;Rolling walker  OT Visit Diagnosis: Unsteadiness on feet (R26.81);Other abnormalities of gait and mobility (R26.89);Muscle weakness  (generalized) (M62.81);Other symptoms and signs involving cognitive function   Activity Tolerance Patient tolerated treatment well   Patient Left in chair;with call bell/phone within reach;with chair alarm set   Nurse Communication Mobility status        Time: 6808-8110 OT Time Calculation (min): 25 min  Charges: OT General Charges $OT Visit: 1 Visit OT Treatments $Self Care/Home Management : 8-22 mins  [place] OT office: [place] 12/27/2018, 2:31 PM

## 2018-12-27 NOTE — Progress Notes (Signed)
[name], female   DOB: Jun 06, 1955, 63 y.o.   MRN: [id number]  PROGRESS NOTE    Marie Woods  WUJ:[id number] DOB: Nov 14, 1955 DOA: 12/13/2018 PCP: Marcine Matar, MD   Brief Narrative:  63 year old female with history of asthma, hypertension, alcohol abuse was brought to the hospital confused and altered.  She was found to have UTI; urine culture showed multiple species.  MRI brain was suggestive of possible Wernicke's encephalopathy.  Treated with thiamine.  PT recommended SNF placement.  Assessment & Plan:   Severe sepsis: Present on admission UTI -Urine culture showed multiple species.  Repeat culture showed less than 10,000 colonies.  Was initially treated with IV Rocephin and fluids.  Subsequently antibiotics have been discontinued. -Currently afebrile and hemodynamically stable.  Sepsis has resolved.  Chest x-ray was negative for infection.  Acute metabolic encephalopathy Possible Wernicke's encephalopathy -CT of the head showed mild atrophic changes without acute intracranial abnormality -MRI of the brain: Symmetric diffusion weighted and T2/FLAIR hyperintense signal abnormality within the medial thalami.  Findings favor sequelae of Wernicke's encephalopathy There was question of patient having alcohol abuse however alcohol level less than 10.  Urine drug screen negative -Patient was started on high-dose thiamine. -Discussed with neurology on 12/16/2018, Dr. Laurence Slate, recommended thiamine 500 mg 3 times daily for 3 days then followed by 100 mg daily. -Of note vitamin B 1 level ordered however patient has been on thiamine since admission, therefore level may not be accurate. -Discussed with Dr. Otelia Limes on 12/19/2018, recommended continuing thiamine IV 250 mg twice daily for 3 days and then transition to 100 mg oral daily.  Likely has memory impairment due to [facility]. -Outpatient follow-up with neurology. -Mental status slowly improving.  Difficult to keep patient  focused and needing to be reoriented frequently.  Essential hypertension -Blood pressure on the lower side.  Amlodipine on hold.  Rash -Complains of itching and rash on her chin and wrists.  She also has a rash on her right side of the abdomen, looks like psoriasis.  Patient states that it is new but not sure if that is the case.  Will use topical steroid cream and oral antiallergic medication.  Will need outpatient dermatology evaluation.  History of alcohol abuse -Continue folate, thiamine and multivitamin.  Continue CIWA protocol  History of tobacco abuse -Continue nicotine patch  Generalized deconditioning Bedbug -Bedbug found on patient on presentation. -PT/OT recommending SNF.  Social worker following. -Feel that patient does need supervision as she appears to have short-term memory impairment.  We discuss the reason for her hospitalization on a daily basis but she does not remember the next day.  DVT prophylaxis: Lovenox Code Status: Full Family Communication: None at bedside Disposition Plan: SNF once bed is available  Consultants: Neurology via phone  Procedures: None  Antimicrobials:  Anti-infectives (From admission, onward)   Start     Dose/Rate Route Frequency Ordered Stop   12/14/18 0100  cefTRIAXone (ROCEPHIN) 1 g in sodium chloride 0.9 % 100 mL IVPB  Status:  Discontinued     1 g 200 mL/hr over 30 Minutes Intravenous Every 24 hours 12/14/18 0010 12/17/18 0948       Subjective: Patient seen and examined at bedside.  Very poor historian.  Sleepy, hardly wakes up on calling her name.  No fever or vomiting reported. Objective: Vitals:   12/26/18 0539 12/26/18 1230 12/26/18 2126 12/27/18 0522  BP: 125/77 105/67 123/79 127/80  Pulse: 80 89 85 82  Resp: 16 16  16 14  Temp: 98.5 F (36.9 C) 98.5 F (36.9 C) 98.3 F (36.8 C) 98 F (36.7 C)  TempSrc: Oral Oral Oral Oral  SpO2: 100% 100% 98% 100%  Weight:      Height:        Intake/Output Summary (Last 24  hours) at 12/27/2018 0805 Last data filed at 12/26/2018 1201 Gross per 24 hour  Intake 240 ml  Output -  Net 240 ml   Filed Weights   12/15/18 0101  Weight: 98.4 kg    Examination:  General exam: No distress.  Looks chronically ill.  Wakes up only slightly on calling her name, does not answer much questions. Respiratory system: Bilateral decreased breath sounds at bases, some scattered crackles Cardiovascular system: S1-S2 heard, rate controlled Gastrointestinal system: Abdomen is nondistended, soft and nontender. Normal bowel sounds heard. Extremities: No cyanosis, edema  Skin: Papular rash around the chin and bilateral wrists.  Also scaly rash on her right side of the abdomen   Data Reviewed: I have personally reviewed following labs and imaging studies  CBC: Recent Labs  Lab 12/21/18 0303 12/26/18 0248  WBC 7.4 5.9  NEUTROABS  --  3.7  HGB 11.7* 12.2  HCT 33.6* 36.5  MCV 85.7 87.1  PLT 242 295   Basic Metabolic Panel: Recent Labs  Lab 12/21/18 0303 12/26/18 0248 12/27/18 0601  NA 139 139  --   K 3.3* 3.8  --   CL 110 106  --   CO2 20* 25  --   GLUCOSE 93 95  --   BUN 7* 8  --   CREATININE 0.61 0.65 0.58  CALCIUM 8.5* 9.4  --   MG 1.8 2.0  --    GFR: Estimated Creatinine Clearance: 89 mL/min (by C-G formula based on SCr of 0.58 mg/dL). Liver Function Tests: Recent Labs  Lab 12/26/18 0248  AST 26  ALT 23  ALKPHOS 32*  BILITOT 1.2  PROT 6.6  ALBUMIN 3.2*   No results for input(s): LIPASE, AMYLASE in the last 168 hours. No results for input(s): AMMONIA in the last 168 hours. Coagulation Profile: No results for input(s): INR, PROTIME in the last 168 hours. Cardiac Enzymes: No results for input(s): CKTOTAL, CKMB, CKMBINDEX, TROPONINI in the last 168 hours. BNP (last 3 results) No results for input(s): PROBNP in the last 8760 hours. HbA1C: No results for input(s): HGBA1C in the last 72 hours. CBG: Recent Labs  Lab 12/24/18 1634  GLUCAP 87    Lipid Profile: No results for input(s): CHOL, HDL, LDLCALC, TRIG, CHOLHDL, LDLDIRECT in the last 72 hours. Thyroid Function Tests: No results for input(s): TSH, T4TOTAL, FREET4, T3FREE, THYROIDAB in the last 72 hours. Anemia Panel: No results for input(s): VITAMINB12, FOLATE, FERRITIN, TIBC, IRON, RETICCTPCT in the last 72 hours. Sepsis Labs: No results for input(s): PROCALCITON, LATICACIDVEN in the last 168 hours.  No results found for this or any previous visit (from the past 240 hour(s)).       Radiology Studies: No results found.      Scheduled Meds: . clobetasol cream   Topical BID  . enoxaparin (LOVENOX) injection  40 mg Subcutaneous Q24H  . folic acid  1 mg Oral Daily  . multivitamin with minerals  1 tablet Oral Daily  . nicotine  21 mg Transdermal Daily  . thiamine  100 mg Oral Daily   Continuous Infusions:         Aline August, MD [facility] 12/27/2018, 8:05 AM

## 2018-12-27 NOTE — Progress Notes (Signed)
OT Cancellation Note  Patient Details Name: Marie Woods MRN: [id number] DOB: 06-Sep-1955   Cancelled Treatment:     Attempted to see patient, however she is using the bathroom and requiring increased time for bowel movement. Will re-attempt as time permits.  [place] OT office: [place] 12/27/2018, 11:23 AM

## 2018-12-27 NOTE — Progress Notes (Signed)
Physical Therapy Treatment Patient Details Name: Marie Woods MRN: [id number] DOB: 01/14/1956 Today's Date: 12/27/2018    History of Present Illness Patient is a 63 year old female with medical history significant of asthma, hypertension, alcohol abuse who was brought in by EMS after neighbors called in stating that patient is confused and altered. Patient admitted Severe Sepsis with Acute metabolic encephalopathy secondary to UTI.     PT Comments    Patient seen for mobility progression.  Pt continues to present with cognitive and balance deficits requiring assist for mobility. Continue to progress as tolerated with anticipated d/c to SNF for further skilled PT services.    Follow Up Recommendations  SNF;Supervision/Assistance - 24 hour     Equipment Recommendations  Rolling walker with 5" wheels    Recommendations for Other Services       Precautions / Restrictions Precautions Precautions: Fall Precaution Comments: was on contact for bed bugs, not on on 11/16 Restrictions Weight Bearing Restrictions: No    Mobility  Bed Mobility               General bed mobility comments: NA  Transfers Overall transfer level: Needs assistance Equipment used: Rolling walker (2 wheeled) Transfers: Sit to/from Stand Sit to Stand: Min assist;Mod assist         General transfer comment: verbal cues for hand placement; assistance required to power up into standing  Ambulation/Gait Ambulation/Gait assistance: Min assist;Mod assist Gait Distance (Feet): 70 Feet Assistive device: Rolling walker (2 wheeled) Gait Pattern/deviations: Step-through pattern;Decreased stride length;Wide base of support;Trunk flexed Gait velocity: decreased   General Gait Details: cues for upright posture and for safe use of AD; assistance required for balance and to manage RW when turning    Stairs             Wheelchair Mobility    Modified Rankin (Stroke Patients Only)        Balance Overall balance assessment: Needs assistance Sitting-balance support: Feet supported Sitting balance-Leahy Scale: Good     Standing balance support: Bilateral upper extremity supported;During functional activity Standing balance-Leahy Scale: Poor Standing balance comment: BUE support on RW                            Cognition Arousal/Alertness: Awake/alert Behavior During Therapy: WFL for tasks assessed/performed Overall Cognitive Status: No family/caregiver present to determine baseline cognitive functioning Area of Impairment: Memory;Safety/judgement;Awareness;Orientation                 Orientation Level: Disoriented to;Time;Situation   Memory: Decreased short-term memory   Safety/Judgement: Decreased awareness of safety;Decreased awareness of deficits Awareness: Emergent   General Comments: patient unaware she had been sitting on toilet for 2 hours trying to have bowel movement      Exercises      General Comments        Pertinent Vitals/Pain Pain Assessment: No/denies pain    Home Living                      Prior Function            PT Goals (current goals can now be found in the care plan section) Acute Rehab PT Goals Patient Stated Goal: "to have coffee" Progress towards PT goals: Progressing toward goals    Frequency    Min 3X/week      PT Plan Current plan remains appropriate    Co-evaluation PT/OT/SLP Co-Evaluation/Treatment: Yes  Reason for Co-Treatment: For patient/therapist safety;To address functional/ADL transfers PT goals addressed during session: Mobility/safety with mobility        AM-PAC PT "6 Clicks" Mobility   Outcome Measure  Help needed turning from your back to your side while in a flat bed without using bedrails?: None Help needed moving from lying on your back to sitting on the side of a flat bed without using bedrails?: None Help needed moving to and from a bed to a chair (including a  wheelchair)?: A Little Help needed standing up from a chair using your arms (e.g., wheelchair or bedside chair)?: A Little Help needed to walk in hospital room?: A Little Help needed climbing 3-5 steps with a railing? : A Lot 6 Click Score: 19    End of Session Equipment Utilized During Treatment: Gait belt Activity Tolerance: Patient tolerated treatment well Patient left: in chair;with call bell/phone within reach;with chair alarm set Nurse Communication: Mobility status PT Visit Diagnosis: Other abnormalities of gait and mobility (R26.89);Muscle weakness (generalized) (M62.81);Difficulty in walking, not elsewhere classified (R26.2)     Time: 7062-3762 PT Time Calculation (min) (ACUTE ONLY): 31 min  Charges:  $Gait Training: 8-22 mins                     Erline Levine, PTA Acute Rehabilitation Services Pager: [phone] Office: [phone]     Carolynne Edouard 12/27/2018, 5:04 PM

## 2018-12-28 NOTE — TOC Progression Note (Signed)
Transition of Care [facility]) - Progression Note    Patient Details  Name: Marie Woods MRN: [id number] Date of Birth: [birth year]  Transition of Care [facility]) CM/SW [place], LCSW Phone Number: 12/28/2018, 10:28 AM  Clinical Narrative:    Patient still awaiting SNF bed offer and remains on [facility] waitlist.    Expected Discharge Plan: [place] Barriers to Discharge: Continued Medical Work up, SNF Pending payor source - LOG  Expected Discharge Plan and Services Expected Discharge Plan: [place] In-house Referral: Clinical Social Work Discharge Planning Services: NA Post Acute Care Choice: [place] Living arrangements for the past 2 months: Apartment                 DME Arranged: N/A DME Agency: NA                   Social Determinants of Health ([place]) Interventions    Readmission Risk Interventions No flowsheet data found.

## 2018-12-28 NOTE — Progress Notes (Signed)
[name], female   DOB: [birth year], 63 y.o.   MRN: [id number]  PROGRESS NOTE    Marie Woods  TOI:[id number] DOB: 1955-11-28 DOA: 12/13/2018 PCP: Marcine Matar, MD   Brief Narrative:  63 year old female with history of asthma, hypertension, alcohol abuse was brought to the hospital confused and altered.  She was found to have UTI; urine culture showed multiple species.  MRI brain was suggestive of possible Wernicke's encephalopathy.  Treated with thiamine.  PT recommended SNF placement.  Assessment & Plan:   Severe sepsis: Present on admission UTI -Urine culture showed multiple species.  Repeat culture showed less than 10,000 colonies.  Was initially treated with IV Rocephin and fluids.  Subsequently antibiotics have been discontinued. -Currently afebrile and hemodynamically stable.  Sepsis has resolved.  Chest x-ray was negative for infection.  Acute metabolic encephalopathy Possible Wernicke's encephalopathy -CT of the head showed mild atrophic changes without acute intracranial abnormality -MRI of the brain: Symmetric diffusion weighted and T2/FLAIR hyperintense signal abnormality within the medial thalami.  Findings favor sequelae of Wernicke's encephalopathy There was question of patient having alcohol abuse however alcohol level less than 10.  Urine drug screen negative -Patient was started on high-dose thiamine. -Discussed with neurology on 12/16/2018, Dr. Laurence Slate, recommended thiamine 500 mg 3 times daily for 3 days then followed by 100 mg daily. -Of note vitamin B 1 level ordered however patient has been on thiamine since admission, therefore level may not be accurate. -Discussed with Dr. Otelia Limes on 12/19/2018, recommended continuing thiamine IV 250 mg twice daily for 3 days and then transition to 100 mg oral daily.  Likely has memory impairment due to [facility]. -Outpatient follow-up with neurology. -Mental status slowly improving.  Difficult to keep patient  focused and needing to be reoriented frequently.  Essential hypertension -Blood pressure on the lower side.  Amlodipine on hold.  Rash -Complains of itching and rash on her chin and wrists.  She also has a rash on her right side of the abdomen, looks like psoriasis.  Patient states that it is new but not sure if that is the case.  Will use topical steroid cream and oral antiallergic medication.  Will need outpatient dermatology evaluation.  History of alcohol abuse -Continue folate, thiamine and multivitamin.  Continue CIWA protocol  History of tobacco abuse -Continue nicotine patch  Generalized deconditioning Bedbug -Bedbug found on patient on presentation. -PT/OT recommending SNF.  Social worker following. -Feel that patient does need supervision as she appears to have short-term memory impairment.  We discuss the reason for her hospitalization on a daily basis but she does not remember the next day.  DVT prophylaxis: Lovenox Code Status: Full Family Communication: None at bedside Disposition Plan: SNF once bed is available  Consultants: Neurology via phone  Procedures: None  Antimicrobials:  Anti-infectives (From admission, onward)   Start     Dose/Rate Route Frequency Ordered Stop   12/14/18 0100  cefTRIAXone (ROCEPHIN) 1 g in sodium chloride 0.9 % 100 mL IVPB  Status:  Discontinued     1 g 200 mL/hr over 30 Minutes Intravenous Every 24 hours 12/14/18 0010 12/17/18 0948       Subjective: Patient seen and examined at bedside.  Very poor historian.  No overnight fever, vomiting or agitation reported.   Objective: Vitals:   12/27/18 0522 12/27/18 1356 12/27/18 2144 12/28/18 0444  BP: 127/80 119/76 107/64 (!) 107/59  Pulse: 82 96 80 80  Resp: 14 17 18 18   Temp:  98 F (36.7 C) 98.2 F (36.8 C) 98.5 F (36.9 C) 98.3 F (36.8 C)  TempSrc: Oral Oral Oral Oral  SpO2: 100% 96% 94% 95%  Weight:      Height:        Intake/Output Summary (Last 24 hours) at 12/28/2018  0801 Last data filed at 12/28/2018 0505 Gross per 24 hour  Intake 240 ml  Output -  Net 240 ml   Filed Weights   12/15/18 0101  Weight: 98.4 kg    Examination:  General exam: No acute distress.  Looks chronically ill.  Looks older than stated age.  Awake, pleasantly confused.   Respiratory system: Bilateral decreased breath sounds at bases, no wheezing cardiovascular system: S1-S2 heard, rate controlled Gastrointestinal system: Abdomen is nondistended, soft and nontender. Normal bowel sounds heard. Extremities: No cyanosis, edema  Skin: Papular rash around the chin and bilateral wrists.  Also scaly rash on her right side of the abdomen   Data Reviewed: I have personally reviewed following labs and imaging studies  CBC: Recent Labs  Lab 12/26/18 0248  WBC 5.9  NEUTROABS 3.7  HGB 12.2  HCT 36.5  MCV 87.1  PLT 297   Basic Metabolic Panel: Recent Labs  Lab 12/26/18 0248 12/27/18 0601  NA 139  --   K 3.8  --   CL 106  --   CO2 25  --   GLUCOSE 95  --   BUN 8  --   CREATININE 0.65 0.58  CALCIUM 9.4  --   MG 2.0  --    GFR: Estimated Creatinine Clearance: 89 mL/min (by C-G formula based on SCr of 0.58 mg/dL). Liver Function Tests: Recent Labs  Lab 12/26/18 0248  AST 26  ALT 23  ALKPHOS 32*  BILITOT 1.2  PROT 6.6  ALBUMIN 3.2*   No results for input(s): LIPASE, AMYLASE in the last 168 hours. No results for input(s): AMMONIA in the last 168 hours. Coagulation Profile: No results for input(s): INR, PROTIME in the last 168 hours. Cardiac Enzymes: No results for input(s): CKTOTAL, CKMB, CKMBINDEX, TROPONINI in the last 168 hours. BNP (last 3 results) No results for input(s): PROBNP in the last 8760 hours. HbA1C: No results for input(s): HGBA1C in the last 72 hours. CBG: Recent Labs  Lab 12/24/18 1634  GLUCAP 87   Lipid Profile: No results for input(s): CHOL, HDL, LDLCALC, TRIG, CHOLHDL, LDLDIRECT in the last 72 hours. Thyroid Function Tests: No  results for input(s): TSH, T4TOTAL, FREET4, T3FREE, THYROIDAB in the last 72 hours. Anemia Panel: No results for input(s): VITAMINB12, FOLATE, FERRITIN, TIBC, IRON, RETICCTPCT in the last 72 hours. Sepsis Labs: No results for input(s): PROCALCITON, LATICACIDVEN in the last 168 hours.  No results found for this or any previous visit (from the past 240 hour(s)).       Radiology Studies: No results found.      Scheduled Meds: . clobetasol cream   Topical BID  . enoxaparin (LOVENOX) injection  40 mg Subcutaneous Q24H  . folic acid  1 mg Oral Daily  . multivitamin with minerals  1 tablet Oral Daily  . nicotine  21 mg Transdermal Daily  . thiamine  100 mg Oral Daily   Continuous Infusions:         Aline August, MD [facility] 12/28/2018, 8:01 AM

## 2018-12-29 NOTE — Progress Notes (Signed)
[name], female   DOB: [birth year], 63 y.o.   MRN: [id number]  PROGRESS NOTE    AILAH BARNA  WUJ:[id number] DOB: 08/25/1955 DOA: 12/13/2018 PCP: Ladell Pier, MD   Brief Narrative:  63 year old female with history of asthma, hypertension, alcohol abuse was brought to the hospital confused and altered.  She was found to have UTI; urine culture showed multiple species.  MRI brain was suggestive of possible Wernicke's encephalopathy.  Treated with thiamine.  PT recommended SNF placement.  Assessment & Plan:   Severe sepsis: Present on admission UTI -Urine culture showed multiple species.  Repeat culture showed less than 10,000 colonies.  Was initially treated with IV Rocephin and fluids.  Subsequently antibiotics have been discontinued. -Currently afebrile and hemodynamically stable.  Sepsis has resolved.  Chest x-ray was negative for infection.  Acute metabolic encephalopathy Possible Wernicke's encephalopathy -CT of the head showed mild atrophic changes without acute intracranial abnormality -MRI of the brain: Symmetric diffusion weighted and T2/FLAIR hyperintense signal abnormality within the medial thalami.  Findings favor sequelae of Wernicke's encephalopathy There was question of patient having alcohol abuse however alcohol level less than 10.  Urine drug screen negative -Patient was started on high-dose thiamine. -Discussed with neurology on 12/16/2018, Dr. Lorraine Lax, recommended thiamine 500 mg 3 times daily for 3 days then followed by 100 mg daily. -Of note vitamin B 1 level ordered however patient has been on thiamine since admission, therefore level may not be accurate. -Discussed with Dr. Cheral Marker on 12/19/2018, recommended continuing thiamine IV 250 mg twice daily for 3 days and then transition to 100 mg oral daily.  Likely has memory impairment due to [facility]. -Outpatient follow-up with neurology. -Mental status slowly improving.  Difficult to keep patient  focused and needing to be reoriented frequently.  Essential hypertension -Blood pressure on the lower side.  Amlodipine on hold.  History of alcohol abuse -Continue folate, thiamine and multivitamin.  Continue CIWA protocol  History of tobacco abuse -Continue nicotine patch  Generalized deconditioning Bedbug -Bedbug found on patient on presentation. -PT/OT recommending SNF.  Social worker following. -Feel that patient does need supervision as she appears to have short-term memory impairment.  We discuss the reason for her hospitalization on a daily basis but she does not remember the next day.  DVT prophylaxis: Lovenox Code Status: Full Family Communication: None at bedside Disposition Plan: SNF once bed is available  Consultants: Neurology via phone  Procedures: None  Antimicrobials:  Anti-infectives (From admission, onward)   Start     Dose/Rate Route Frequency Ordered Stop   12/14/18 0100  cefTRIAXone (ROCEPHIN) 1 g in sodium chloride 0.9 % 100 mL IVPB  Status:  Discontinued     1 g 200 mL/hr over 30 Minutes Intravenous Every 24 hours 12/14/18 0010 12/17/18 0948       Subjective: Patient seen and examined at bedside.  Very poor historian.  No new complaints.  Objective: Vitals:   12/28/18 0444 12/28/18 1310 12/28/18 2129 12/29/18 0522  BP: (!) 107/59 (!) 138/93 (!) 126/97 (!) 115/99  Pulse: 80 84 86 78  Resp: 18 18    Temp: 98.3 F (36.8 C) 98.2 F (36.8 C) 98.7 F (37.1 C) 98.9 F (37.2 C)  TempSrc: Oral Oral Oral Oral  SpO2: 95% 100% 100% 100%  Weight:      Height:        Intake/Output Summary (Last 24 hours) at 12/29/2018 0841 Last data filed at 12/28/2018 2140 Gross per 24 hour  Intake 940 ml  Output 300 ml  Net 640 ml   Filed Weights   12/15/18 0101  Weight: 98.4 kg    Examination:  General exam: No distress.  Looks chronically ill.  Looks older than stated age.  Awake, pleasantly confused.   Respiratory system: Bilateral decreased breath  sounds at bases  cardiovascular system: S1-S2 heard, rate controlled Gastrointestinal system: Abdomen is nondistended, soft and nontender. Normal bowel sounds heard. Extremities: No cyanosis, edema   Data Reviewed: I have personally reviewed following labs and imaging studies  CBC: Recent Labs  Lab 12/26/18 0248  WBC 5.9  NEUTROABS 3.7  HGB 12.2  HCT 36.5  MCV 87.1  PLT 285   Basic Metabolic Panel: Recent Labs  Lab 12/26/18 0248 12/27/18 0601  NA 139  --   K 3.8  --   CL 106  --   CO2 25  --   GLUCOSE 95  --   BUN 8  --   CREATININE 0.65 0.58  CALCIUM 9.4  --   MG 2.0  --    GFR: Estimated Creatinine Clearance: 89 mL/min (by C-G formula based on SCr of 0.58 mg/dL). Liver Function Tests: Recent Labs  Lab 12/26/18 0248  AST 26  ALT 23  ALKPHOS 32*  BILITOT 1.2  PROT 6.6  ALBUMIN 3.2*   No results for input(s): LIPASE, AMYLASE in the last 168 hours. No results for input(s): AMMONIA in the last 168 hours. Coagulation Profile: No results for input(s): INR, PROTIME in the last 168 hours. Cardiac Enzymes: No results for input(s): CKTOTAL, CKMB, CKMBINDEX, TROPONINI in the last 168 hours. BNP (last 3 results) No results for input(s): PROBNP in the last 8760 hours. HbA1C: No results for input(s): HGBA1C in the last 72 hours. CBG: Recent Labs  Lab 12/24/18 1634  GLUCAP 87   Lipid Profile: No results for input(s): CHOL, HDL, LDLCALC, TRIG, CHOLHDL, LDLDIRECT in the last 72 hours. Thyroid Function Tests: No results for input(s): TSH, T4TOTAL, FREET4, T3FREE, THYROIDAB in the last 72 hours. Anemia Panel: No results for input(s): VITAMINB12, FOLATE, FERRITIN, TIBC, IRON, RETICCTPCT in the last 72 hours. Sepsis Labs: No results for input(s): PROCALCITON, LATICACIDVEN in the last 168 hours.  No results found for this or any previous visit (from the past 240 hour(s)).       Radiology Studies: No results found.      Scheduled Meds: . clobetasol cream    Topical BID  . enoxaparin (LOVENOX) injection  40 mg Subcutaneous Q24H  . folic acid  1 mg Oral Daily  . multivitamin with minerals  1 tablet Oral Daily  . nicotine  21 mg Transdermal Daily  . thiamine  100 mg Oral Daily   Continuous Infusions:         Glade Lloyd, MD [facility] 12/29/2018, 8:41 AM

## 2018-12-30 DIAGNOSIS — R410 Disorientation, unspecified: Secondary | ICD-10-CM

## 2018-12-30 LAB — AMMONIA: Ammonia: 15 umol/L (ref 9–35)

## 2018-12-30 MED ORDER — AMLODIPINE BESYLATE 5 MG PO TABS
5.0000 mg | ORAL_TABLET | Freq: Every day | ORAL | Status: DC
  Administered 2018-12-30 – 2019-04-04 (×86): 5 mg via ORAL
  Filled 2018-12-30 (×95): qty 1

## 2018-12-30 NOTE — TOC Progression Note (Signed)
Transition of Care [facility]) - Progression Note    Patient Details  Name: Marie Woods MRN: [id number] Date of Birth: Sep 29, 1955  Transition of Care [facility]) CM/SW [facility], LCSW Phone Number: 12/30/2018, 5:08 PM  Clinical Narrative:    Patient still awaiting SNF bed offer and remains on [facility] waitlist.   Expected Discharge Plan: [place] Barriers to Discharge: Continued Medical Work up, SNF Pending payor source - LOG  Expected Discharge Plan and Services Expected Discharge Plan: [place] In-house Referral: Clinical Social Work Discharge Planning Services: NA Post Acute Care Choice: [place] Living arrangements for the past 2 months: Apartment                 DME Arranged: N/A DME Agency: NA                   Social Determinants of Health ([place]) Interventions    Readmission Risk Interventions No flowsheet data found.

## 2018-12-30 NOTE — Progress Notes (Signed)
PROGRESS NOTE    Marie Woods    Code Status: Full Code  DQQ:[id number] DOB: 1955-04-04 DOA: 12/13/2018  PCP: Ladell Pier, MD    Hospital Summary  64 year old female with history of asthma, hypertension, alcohol abuse was brought to the hospital confused and altered.  She was found to have UTI; urine culture showed multiple species.  MRI brain was suggestive of possible Wernicke's encephalopathy.  Treated with thiamine.  PT recommended SNF placement.  A & P   Principal Problem:   AMS (altered mental status) Active Problems:   History of alcohol use   TOBACCO ABUSE   Essential hypertension   Severe sepsis secondary to polymicrobial UTI: Present on admission, currently resolved with Rocephin -Monitor  Acute metabolic encephalopathy likely secondary to sepsis as above and Possible Wernicke's encephalopathy, likely underlying dementia -CT of the head showed mild atrophic changes without acute intracranial abnormality -MRI of the brain: Symmetric diffusion weighted and T2/FLAIR hyperintense signal abnormality within the medial thalami. Findings favor sequelae of Wernicke's encephalopathy There was question of patient having alcohol abuse however alcohol level less than 10. Urine drug screen negative -Patient was started on high-dose thiamine. -Discussed with neurology on 12/16/2018, Dr. Lorraine Lax, recommended thiamine 500 mg 3 times daily for 3 days then followed by 100 mg daily. -Of note vitamin B 1 level ordered however patient has been on thiamine since admission, therefore level may not be accurate. -Discussed with Dr. Cheral Marker on 12/19/2018, recommended continuing thiamine IV 250 mg twice daily for 3 days and then transition to 100 mg oral daily.Likely has memory impairment due to [facility]. -Outpatient follow-up with neurology. -Mental status slowly improving.  Difficult to keep patient focused and needing to be reoriented frequently, requiring virtual sitter today  -Ammonia level  Essential hypertension -Restart amlodipine today which was on hold due to soft BPs  History of alcohol abuse -Continue folate, thiamine and multivitamin.     -Discontinue CIWA protocol as she has not had any Ativan  History of tobacco abuse -Continue nicotine patch  Generalized deconditioning Bedbug -Bedbug found on patient on presentation. -PT/OT recommending SNF.  Social worker following. -Feel that patient does need supervision as she appears to have short-term memory impairment. We discuss the reason for her hospitalization on a daily basis but she does not remember the next day.  DVT prophylaxis: Lovenox Diet: Heart healthy Family Communication: No family at bedside Disposition Plan: DC pending bed availability at SNF, likely stable for DC in 1 to 2 days  Consultants  Neurology via phone  Procedures  None  Antibiotics   Anti-infectives (From admission, onward)   Start     Dose/Rate Route Frequency Ordered Stop   12/14/18 0100  cefTRIAXone (ROCEPHIN) 1 g in sodium chloride 0.9 % 100 mL IVPB  Status:  Discontinued     1 g 200 mL/hr over 30 Minutes Intravenous Every 24 hours 12/14/18 0010 12/17/18 0948           Subjective   Patient seen and examined at bedside in no acute distress and resting comfortably. No acute events overnight. Denies any acute complaints at this time. Ambulating. Tolerating diet well.  Nursing paged me this afternoon stating patient has been frequently getting out of bed requiring sitter.  Patient is a poor historian.  Objective   Vitals:   12/30/18 0544 12/30/18 0930 12/30/18 1238 12/30/18 1300  BP: (!) 148/93 127/77 (!) 137/94 128/88  Pulse: 88 99 98 99  Resp: 19 18 19 18   Temp:  97.9 F (36.6 C) 98.3 F (36.8 C) 98.3 F (36.8 C) 98.7 F (37.1 C)  TempSrc:  Oral Oral Oral  SpO2: 100% 100% 100% 100%  Weight:      Height:       No intake or output data in the 24 hours ending 12/30/18 1608 Filed Weights    12/15/18 0101  Weight: 98.4 kg    Examination:  Physical Exam Vitals signs and nursing note reviewed.  Constitutional:      General: She is not in acute distress.    Appearance: She is not ill-appearing.     Comments: AAO x1 Pleasantly confused  HENT:     Head: Normocephalic and atraumatic.     Comments: Poor dentition    Mouth/Throat:     Mouth: Mucous membranes are moist.  Eyes:     Extraocular Movements: Extraocular movements intact.  Neck:     Musculoskeletal: Normal range of motion.  Cardiovascular:     Rate and Rhythm: Normal rate and regular rhythm.  Pulmonary:     Effort: Pulmonary effort is normal.     Breath sounds: Normal breath sounds.  Neurological:     Comments: Pleasantly confused     Data Reviewed: I have personally reviewed following labs and imaging studies  CBC: Recent Labs  Lab 12/26/18 0248  WBC 5.9  NEUTROABS 3.7  HGB 12.2  HCT 36.5  MCV 87.1  PLT 285   Basic Metabolic Panel: Recent Labs  Lab 12/26/18 0248 12/27/18 0601  NA 139  --   K 3.8  --   CL 106  --   CO2 25  --   GLUCOSE 95  --   BUN 8  --   CREATININE 0.65 0.58  CALCIUM 9.4  --   MG 2.0  --    GFR: Estimated Creatinine Clearance: 89 mL/min (by C-G formula based on SCr of 0.58 mg/dL). Liver Function Tests: Recent Labs  Lab 12/26/18 0248  AST 26  ALT 23  ALKPHOS 32*  BILITOT 1.2  PROT 6.6  ALBUMIN 3.2*   No results for input(s): LIPASE, AMYLASE in the last 168 hours. No results for input(s): AMMONIA in the last 168 hours. Coagulation Profile: No results for input(s): INR, PROTIME in the last 168 hours. Cardiac Enzymes: No results for input(s): CKTOTAL, CKMB, CKMBINDEX, TROPONINI in the last 168 hours. BNP (last 3 results) No results for input(s): PROBNP in the last 8760 hours. HbA1C: No results for input(s): HGBA1C in the last 72 hours. CBG: Recent Labs  Lab 12/24/18 1634  GLUCAP 87   Lipid Profile: No results for input(s): CHOL, HDL, LDLCALC, TRIG,  CHOLHDL, LDLDIRECT in the last 72 hours. Thyroid Function Tests: No results for input(s): TSH, T4TOTAL, FREET4, T3FREE, THYROIDAB in the last 72 hours. Anemia Panel: No results for input(s): VITAMINB12, FOLATE, FERRITIN, TIBC, IRON, RETICCTPCT in the last 72 hours. Sepsis Labs: No results for input(s): PROCALCITON, LATICACIDVEN in the last 168 hours.  No results found for this or any previous visit (from the past 240 hour(s)).       Radiology Studies: No results found.      Scheduled Meds: . amLODipine  5 mg Oral Daily  . clobetasol cream   Topical BID  . enoxaparin (LOVENOX) injection  40 mg Subcutaneous Q24H  . folic acid  1 mg Oral Daily  . multivitamin with minerals  1 tablet Oral Daily  . nicotine  21 mg Transdermal Daily  . thiamine  100 mg Oral Daily  Continuous Infusions:   LOS: 17 days    Time spent: 15 minutes with over 50% of the time coordinating the patient's care    Jae Dire, DO [facility] Pager [phone]  If 7PM-7AM, please contact night-coverage [email or web address] Password [facility] [place] 12/30/2018, 4:08 PM

## 2018-12-30 NOTE — Progress Notes (Signed)
   12/29/18 2047  MEWS Score  Resp 16  Pulse Rate 97  BP 128/73  Temp 97.9 F (36.6 C)  SpO2 100 %  O2 Device Room Air  MEWS Score  MEWS RR 0  MEWS Pulse 0  MEWS Systolic 0  MEWS LOC 0  MEWS Temp 0  MEWS Score 0  MEWS Score Color Green  MEWS Assessment  Is this an acute change? No   Lab Results WBC  Date/Time Value Ref Range Status  12/26/2018 02:48 AM 5.9 4.0 - 10.5 K/uL Final  12/21/2018 03:03 AM 7.4 4.0 - 10.5 K/uL Final  12/16/2018 06:31 AM 9.5 4.0 - 10.5 K/uL Final   Neutrophils Relative %  Date/Time Value Ref Range Status  12/26/2018 02:48 AM 62 % Final  12/13/2018 03:34 PM 70 % Final  12/02/2018 06:19 PM 64 % Final   pCO2 arterial  Date/Time Value Ref Range Status  11/17/2008 01:23 PM 41.5 35.0 - 45.0 mmHg Final   Lactic Acid, Venous  Date/Time Value Ref Range Status  12/15/2018 03:03 AM 1.7 0.5 - 1.9 mmol/L Final    Comment:    Performed at [facility], [place] [address], [place], [place] [address]  12/13/2018 09:45 PM 2.5 (HH) 0.5 - 1.9 mmol/L Final    Comment:    CRITICAL RESULT CALLED TO, READ BACK BY AND VERIFIED WITH: MUNNETT [place] 12/13/18 [place] Performed at [facility], [place] [address], [place], [place] [address]    No results found for: PCO2VEN

## 2018-12-30 NOTE — Plan of Care (Signed)
  Problem: Education: Goal: Knowledge of General Education information will improve Description: Including pain rating scale, medication(s)/side effects and non-pharmacologic comfort measures Outcome: Not Progressing Note: Confusion   Problem: Health Behavior/Discharge Planning: Goal: Ability to manage health-related needs will improve Outcome: Not Progressing Note: Confusion   Problem: Clinical Measurements: Goal: Ability to maintain clinical measurements within normal limits will improve Outcome: Progressing Goal: Will remain free from infection Outcome: Progressing Goal: Diagnostic test results will improve Outcome: Progressing Goal: Respiratory complications will improve Outcome: Progressing Goal: Cardiovascular complication will be avoided Outcome: Progressing   Problem: Activity: Goal: Risk for activity intolerance will decrease Outcome: Progressing   Problem: Nutrition: Goal: Adequate nutrition will be maintained Outcome: Progressing   Problem: Coping: Goal: Level of anxiety will decrease Outcome: Progressing   Problem: Elimination: Goal: Will not experience complications related to bowel motility Outcome: Progressing Goal: Will not experience complications related to urinary retention Outcome: Progressing   Problem: Pain Managment: Goal: General experience of comfort will improve Outcome: Progressing   Problem: Safety: Goal: Ability to remain free from injury will improve Outcome: Not Progressing Note: Confusion   Problem: Skin Integrity: Goal: Risk for impaired skin integrity will decrease Outcome: Progressing

## 2018-12-30 NOTE — Progress Notes (Signed)
Physical Therapy Treatment Patient Details Name: Marie Woods MRN: [id number] DOB: [birth year] Today's Date: 12/30/2018    History of Present Illness Patient is a 63 year old female with medical history significant of asthma, hypertension, alcohol abuse who was brought in by EMS after neighbors called in stating that patient is confused and altered. Patient admitted Severe Sepsis with Acute metabolic encephalopathy secondary to UTI.     PT Comments    Pt continues to need significant cues for safety and assist with transfers.  Pt needs assistance using RW and tends to leave it unless reminded.  Remains limited by confusion and poor safety awareness.  Pt unsafe to live alone from PT perspective.  Cont to recommend SNF or 24 hr supervision.      Follow Up Recommendations  SNF;Supervision/Assistance - 24 hour     Equipment Recommendations  Rolling walker with 5" wheels    Recommendations for Other Services       Precautions / Restrictions Precautions Precautions: Fall Precaution Comments: was on contact for bed bugs, not on on 11/16    Mobility  Bed Mobility Overal bed mobility: Needs Assistance             General bed mobility comments: at EOB upon arrival  Transfers Overall transfer level: Needs assistance Equipment used: Rolling walker (2 wheeled) Transfers: Sit to/from Stand Sit to Stand: Min assist Stand pivot transfers: From elevated surface       General transfer comment: cues for safe hand placement  Ambulation/Gait Ambulation/Gait assistance: Min assist;Mod assist Gait Distance (Feet): 120 Feet Assistive device: Rolling walker (2 wheeled) Gait Pattern/deviations: Step-through pattern;Trunk flexed;Wide base of support;Drifts right/left Gait velocity: decreased   General Gait Details: cues for safe use RW and assist to manage RW in room and when turning; cues to keep RW with her at all times   Stairs             Wheelchair Mobility     Modified Rankin (Stroke Patients Only)       Balance Overall balance assessment: Needs assistance Sitting-balance support: Feet supported;No upper extremity supported Sitting balance-Leahy Scale: Good Sitting balance - Comments: static sitting balance EOB without LOB   Standing balance support: Bilateral upper extremity supported;During functional activity Standing balance-Leahy Scale: Fair                              Cognition Arousal/Alertness: Awake/alert Behavior During Therapy: WFL for tasks assessed/performed Overall Cognitive Status: No family/caregiver present to determine baseline cognitive functioning Area of Impairment: Memory;Safety/judgement;Awareness;Orientation                 Orientation Level: Disoriented to;Place;Time;Situation   Memory: Decreased short-term memory Following Commands: Follows one step commands consistently Safety/Judgement: Decreased awareness of safety;Decreased awareness of deficits     General Comments: pt unaware that she is at hospital - states "I've got to work a 10 hr shift tonight.";  Also, confused when arrived back at her room - had to show her her name of food tray for her to believe it was her room      Exercises      General Comments General comments (skin integrity, edema, etc.): After return to room - pt declined further therapy and wanted to eat her lunch sitting on EOB      Pertinent Vitals/Pain Pain Assessment: No/denies pain    Home Living  Prior Function            PT Goals (current goals can now be found in the care plan section) Progress towards PT goals: Progressing toward goals    Frequency    Min 3X/week      PT Plan Current plan remains appropriate(goals continued)    Co-evaluation              AM-PAC PT "6 Clicks" Mobility   Outcome Measure  Help needed turning from your back to your side while in a flat bed without using bedrails?:  None Help needed moving from lying on your back to sitting on the side of a flat bed without using bedrails?: None Help needed moving to and from a bed to a chair (including a wheelchair)?: A Little Help needed standing up from a chair using your arms (e.g., wheelchair or bedside chair)?: A Little Help needed to walk in hospital room?: A Little Help needed climbing 3-5 steps with a railing? : A Lot 6 Click Score: 19    End of Session Equipment Utilized During Treatment: Gait belt Activity Tolerance: Patient tolerated treatment well Patient left: with call bell/phone within reach;in bed;with bed alarm set Nurse Communication: Mobility status PT Visit Diagnosis: Other abnormalities of gait and mobility (R26.89);Muscle weakness (generalized) (M62.81);Difficulty in walking, not elsewhere classified (R26.2)     Time: 8182-9937 PT Time Calculation (min) (ACUTE ONLY): 15 min  Charges:  $Gait Training: 8-22 mins                     Maggie Font, PT Acute Rehab Services Pager [phone] [facility] [place] 12/30/2018, 3:57 PM

## 2018-12-31 LAB — CBC
HCT: 32 % — ABNORMAL LOW (ref 36.0–46.0)
Hemoglobin: 11 g/dL — ABNORMAL LOW (ref 12.0–15.0)
MCH: 29.5 pg (ref 26.0–34.0)
MCHC: 34.4 g/dL (ref 30.0–36.0)
MCV: 85.8 fL (ref 80.0–100.0)
Platelets: 280 10*3/uL (ref 150–400)
RBC: 3.73 MIL/uL — ABNORMAL LOW (ref 3.87–5.11)
RDW: 14.6 % (ref 11.5–15.5)
WBC: 6.5 10*3/uL (ref 4.0–10.5)
nRBC: 0 % (ref 0.0–0.2)

## 2018-12-31 LAB — BASIC METABOLIC PANEL
Anion gap: 11 (ref 5–15)
BUN: 11 mg/dL (ref 8–23)
CO2: 23 mmol/L (ref 22–32)
Calcium: 9.5 mg/dL (ref 8.9–10.3)
Chloride: 106 mmol/L (ref 98–111)
Creatinine, Ser: 0.52 mg/dL (ref 0.44–1.00)
GFR calc Af Amer: >60 mL/min (ref >60)
GFR calc non Af Amer: >60 mL/min (ref >60)
Glucose, Bld: 93 mg/dL (ref 70–99)
Potassium: 3.7 mmol/L (ref 3.5–5.1)
Sodium: 140 mmol/L (ref 135–145)

## 2018-12-31 NOTE — Progress Notes (Signed)
PROGRESS NOTE    Marie Woods    Code Status: Full Code  NIO:[id number] DOB: May 29, 1955 DOA: 12/13/2018  PCP: Ladell Pier, MD    Hospital Summary  63 year old female with history of asthma, hypertension, alcohol abuse was brought to the hospital confused and altered.  She was found to have UTI; urine culture showed multiple species.  MRI brain was suggestive of possible Wernicke's encephalopathy.  Treated with thiamine.  PT recommended SNF placement.  A & P   Principal Problem:   AMS (altered mental status) Active Problems:   History of alcohol use   TOBACCO ABUSE   Essential hypertension   Severe sepsis secondary to polymicrobial UTI: Present on admission, currently resolved with Rocephin -Monitor  Acute metabolic encephalopathy likely secondary to sepsis as above and Possible Wernicke's encephalopathy, likely underlying dementia Seems at baseline mental status -CT of the head showed mild atrophic changes without acute intracranial abnormality -MRI of the brain: Symmetric diffusion weighted and T2/FLAIR hyperintense signal abnormality within the medial thalami. Findings favor sequelae of Wernicke's encephalopathy There was question of patient having alcohol abuse however alcohol level less than 10. Urine drug screen negative -Patient was started on high-dose thiamine. -Discussed with neurology on 12/16/2018, Dr. Lorraine Lax, recommended thiamine 500 mg 3 times daily for 3 days then followed by 100 mg daily. -Of note vitamin B 1 level ordered however patient has been on thiamine since admission, therefore level may not be accurate. -Discussed with Dr. Cheral Marker on 12/19/2018, recommended continuing thiamine IV 250 mg twice daily for 3 days and then transition to 100 mg oral daily.Likely has memory impairment due to [facility]. -Outpatient follow-up with neurology. -Mental status slowly improving.  Difficult to keep patient focused and needing to be reoriented frequently,  requiring virtual sitter today  Essential hypertension stable -continue amlodipine which was on hold due to soft BPs  History of alcohol abuse -Continue folate, thiamine and multivitamin.     -Discontinue CIWA protocol as she has not had any Ativan  History of tobacco abuse -Continue nicotine patch  Generalized deconditioning Bedbug -Bedbug found on patient on presentation. -PT/OT recommending SNF.  Social worker following. -Feel that patient does need supervision as she appears to have short-term memory impairment. We discuss the reason for her hospitalization on a daily basis but she does not remember the next day.  DVT prophylaxis: Lovenox Diet: Heart healthy Family Communication: No family at bedside Disposition Plan: DC pending bed availability at SNF, likely stable for DC in 1 to 2 days  Consultants  Neurology via phone  Procedures  None  Antibiotics   Anti-infectives (From admission, onward)   Start     Dose/Rate Route Frequency Ordered Stop   12/14/18 0100  cefTRIAXone (ROCEPHIN) 1 g in sodium chloride 0.9 % 100 mL IVPB  Status:  Discontinued     1 g 200 mL/hr over 30 Minutes Intravenous Every 24 hours 12/14/18 0010 12/17/18 0948           Subjective   Patient seen and examined at bedside no acute distress and resting comfortably.  No events overnight.  Tolerating diet.  Denies any chest pain, shortness of breath, fever, nausea, vomiting, urinary complaints.  Admits to having bowel movement.  Otherwise ROS negative  Objective   Vitals:   12/30/18 1300 12/30/18 2139 12/31/18 0536 12/31/18 1339  BP: 128/88 (!) 150/85 107/69 131/90  Pulse: 99 98 80 (!) 106  Resp: 18  18 19   Temp: 98.7 F (37.1 C) 98 F (  36.7 C) 98.6 F (37 C) 98.3 F (36.8 C)  TempSrc: Oral Oral Oral Oral  SpO2: 100% 100% 98% 99%  Weight:      Height:       No intake or output data in the 24 hours ending 12/31/18 1450 Filed Weights   12/15/18 0101  Weight: 98.4 kg     Examination:  Physical Exam Vitals signs and nursing note reviewed.  Constitutional:      Appearance: Normal appearance.  HENT:     Head: Normocephalic and atraumatic.     Comments: Poor dentition    Nose: Nose normal.     Mouth/Throat:     Mouth: Mucous membranes are moist.  Eyes:     Extraocular Movements: Extraocular movements intact.  Cardiovascular:     Rate and Rhythm: Normal rate and regular rhythm.  Pulmonary:     Effort: Pulmonary effort is normal.     Breath sounds: Normal breath sounds.  Abdominal:     General: Abdomen is flat.     Palpations: Abdomen is soft.  Musculoskeletal: Normal range of motion.        General: No swelling.  Neurological:     Mental Status: She is alert. Mental status is at baseline.     Comments: AAOx1 Pleasant  Psychiatric:        Mood and Affect: Mood normal.        Behavior: Behavior normal.     Data Reviewed: I have personally reviewed following labs and imaging studies  CBC: Recent Labs  Lab 12/26/18 0248 12/31/18 0253  WBC 5.9 6.5  NEUTROABS 3.7  --   HGB 12.2 11.0*  HCT 36.5 32.0*  MCV 87.1 85.8  PLT 285 280   Basic Metabolic Panel: Recent Labs  Lab 12/26/18 0248 12/27/18 0601 12/31/18 0253  NA 139  --  140  K 3.8  --  3.7  CL 106  --  106  CO2 25  --  23  GLUCOSE 95  --  93  BUN 8  --  11  CREATININE 0.65 0.58 0.52  CALCIUM 9.4  --  9.5  MG 2.0  --   --    GFR: Estimated Creatinine Clearance: 89 mL/min (by C-G formula based on SCr of 0.52 mg/dL). Liver Function Tests: Recent Labs  Lab 12/26/18 0248  AST 26  ALT 23  ALKPHOS 32*  BILITOT 1.2  PROT 6.6  ALBUMIN 3.2*   No results for input(s): LIPASE, AMYLASE in the last 168 hours. Recent Labs  Lab 12/30/18 1720  AMMONIA 15   Coagulation Profile: No results for input(s): INR, PROTIME in the last 168 hours. Cardiac Enzymes: No results for input(s): CKTOTAL, CKMB, CKMBINDEX, TROPONINI in the last 168 hours. BNP (last 3 results) No results for  input(s): PROBNP in the last 8760 hours. HbA1C: No results for input(s): HGBA1C in the last 72 hours. CBG: Recent Labs  Lab 12/24/18 1634  GLUCAP 87   Lipid Profile: No results for input(s): CHOL, HDL, LDLCALC, TRIG, CHOLHDL, LDLDIRECT in the last 72 hours. Thyroid Function Tests: No results for input(s): TSH, T4TOTAL, FREET4, T3FREE, THYROIDAB in the last 72 hours. Anemia Panel: No results for input(s): VITAMINB12, FOLATE, FERRITIN, TIBC, IRON, RETICCTPCT in the last 72 hours. Sepsis Labs: No results for input(s): PROCALCITON, LATICACIDVEN in the last 168 hours.  No results found for this or any previous visit (from the past 240 hour(s)).       Radiology Studies: No results found.  Scheduled Meds: . amLODipine  5 mg Oral Daily  . clobetasol cream   Topical BID  . enoxaparin (LOVENOX) injection  40 mg Subcutaneous Q24H  . folic acid  1 mg Oral Daily  . multivitamin with minerals  1 tablet Oral Daily  . nicotine  21 mg Transdermal Daily  . thiamine  100 mg Oral Daily   Continuous Infusions:   LOS: 18 days    Time spent: 15 minutes with over 50% of the time coordinating the patient's care    Jae DireJared E Edoardo Laforte, DO [facility] Pager [phone]  If 7PM-7AM, please contact night-coverage [email or web address] Password [email or web address] 12/31/2018, 2:50 PM

## 2019-01-01 DIAGNOSIS — G44209 Tension-type headache, unspecified, not intractable: Secondary | ICD-10-CM

## 2019-01-01 NOTE — Progress Notes (Signed)
PROGRESS NOTE    Marie Woods    Code Status: Full Code  WUJ:[id number] DOB: [birth year] DOA: 12/13/2018  PCP: Marcine MatarJohnson, Deborah B, MD    Hospital Summary  63 year old female with history of asthma, hypertension, alcohol abuse was brought to the hospital confused and altered.  She was found to have UTI; urine culture showed multiple species.  MRI brain was suggestive of possible Wernicke's encephalopathy.  Treated with thiamine.  PT recommended SNF placement.  A & P   Principal Problem:   AMS (altered mental status) Active Problems:   History of alcohol use   TOBACCO ABUSE   Essential hypertension  Tension Headache Improved with tylenol and ibuprofen  Severe sepsis secondary to polymicrobial UTI: Present on admission, currently resolved with Rocephin  Acute metabolic encephalopathy likely secondary to sepsis as above and Possible Wernicke's encephalopathy, likely underlying dementia Seems at baseline mental status -CT of the head showed mild atrophic changes without acute intracranial abnormality -MRI of the brain: Symmetric diffusion weighted and T2/FLAIR hyperintense signal abnormality within the medial thalami. Findings favor sequelae of Wernicke's encephalopathy There was question of patient having alcohol abuse however alcohol level less than 10. Urine drug screen negative -Patient was started on high-dose thiamine. -Discussed with neurology on 12/16/2018, Dr. Laurence SlateAroor, recommended thiamine 500 mg 3 times daily for 3 days then followed by 100 mg daily. -Of note vitamin B 1 level ordered however patient has been on thiamine since admission, therefore level may not be accurate. -Discussed with Dr. Otelia LimesLindzen on 12/19/2018, recommended continuing thiamine IV 250 mg twice daily for 3 days and then transition to 100 mg oral daily.[facility] -Outpatient follow-up with neurology.  Essential hypertension stable -continue amlodipine which was on  hold due to soft BPs  History of alcohol abuse -Continue folate, thiamine and multivitamin.     -Discontinue CIWA protocol as she has not had any Ativan  History of tobacco abuse -Continue nicotine patch  Generalized deconditioning Bedbug -Bedbug found on patient on presentation. -PT/OT recommending SNF.  Social worker following. -Feel that patient does need supervision as she appears to have short-term memory impairment. We discuss the reason for her hospitalization on a daily basis but she does not remember the next day.  DVT prophylaxis: Lovenox Diet: Heart healthy Family Communication: No family at bedside Disposition Plan: DC pending bed availability at SNF, medically stable for discharge  Consultants  Neurology via phone  Procedures  None  Antibiotics   Anti-infectives (From admission, onward)   Start     Dose/Rate Route Frequency Ordered Stop   12/14/18 0100  cefTRIAXone (ROCEPHIN) 1 g in sodium chloride 0.9 % 100 mL IVPB  Status:  Discontinued     1 g 200 mL/hr over 30 Minutes Intravenous Every 24 hours 12/14/18 0010 12/17/18 0948           Subjective   Complaining of headache today without phonophobia or photophobia. Improved after she got tylenol and ibuprofen this morning. Otherwise denies any complaints.   Objective   Vitals:   12/31/18 1339 12/31/18 2110 01/01/19 0531 01/01/19 1242  BP: 131/90 117/82 (!) 124/98 120/87  Pulse: (!) 106 97 91 80  Resp: 19   18  Temp: 98.3 F (36.8 C) 98.1 F (36.7 C) 97.8 F (36.6 C) 97.8 F (36.6 C)  TempSrc: Oral Oral Oral Oral  SpO2: 99% 98% 100% 100%  Weight:      Height:       No intake or  output data in the 24 hours ending 01/01/19 1435 Filed Weights   12/15/18 0101  Weight: 98.4 kg    Examination:  Physical Exam Vitals signs and nursing note reviewed.  Constitutional:      Appearance: Normal appearance.  HENT:     Head: Normocephalic and atraumatic.     Nose: Nose normal.      Mouth/Throat:     Mouth: Mucous membranes are moist.  Eyes:     Extraocular Movements: Extraocular movements intact.  Neck:     Musculoskeletal: Normal range of motion. No neck rigidity.  Cardiovascular:     Rate and Rhythm: Normal rate and regular rhythm.  Pulmonary:     Effort: Pulmonary effort is normal.     Breath sounds: Normal breath sounds.  Abdominal:     General: Abdomen is flat.     Palpations: Abdomen is soft.  Musculoskeletal: Normal range of motion.        General: No swelling.  Neurological:     General: No focal deficit present.     Mental Status: She is alert. Mental status is at baseline.     Comments: AAOx2  Psychiatric:        Mood and Affect: Mood normal.        Behavior: Behavior normal.     Data Reviewed: I have personally reviewed following labs and imaging studies  CBC: Recent Labs  Lab 12/26/18 0248 12/31/18 0253  WBC 5.9 6.5  NEUTROABS 3.7  --   HGB 12.2 11.0*  HCT 36.5 32.0*  MCV 87.1 85.8  PLT 285 767   Basic Metabolic Panel: Recent Labs  Lab 12/26/18 0248 12/27/18 0601 12/31/18 0253  NA 139  --  140  K 3.8  --  3.7  CL 106  --  106  CO2 25  --  23  GLUCOSE 95  --  93  BUN 8  --  11  CREATININE 0.65 0.58 0.52  CALCIUM 9.4  --  9.5  MG 2.0  --   --    GFR: Estimated Creatinine Clearance: 89 mL/min (by C-G formula based on SCr of 0.52 mg/dL). Liver Function Tests: Recent Labs  Lab 12/26/18 0248  AST 26  ALT 23  ALKPHOS 32*  BILITOT 1.2  PROT 6.6  ALBUMIN 3.2*   No results for input(s): LIPASE, AMYLASE in the last 168 hours. Recent Labs  Lab 12/30/18 1720  AMMONIA 15   Coagulation Profile: No results for input(s): INR, PROTIME in the last 168 hours. Cardiac Enzymes: No results for input(s): CKTOTAL, CKMB, CKMBINDEX, TROPONINI in the last 168 hours. BNP (last 3 results) No results for input(s): PROBNP in the last 8760 hours. HbA1C: No results for input(s): HGBA1C in the last 72 hours. CBG: No results for  input(s): GLUCAP in the last 168 hours. Lipid Profile: No results for input(s): CHOL, HDL, LDLCALC, TRIG, CHOLHDL, LDLDIRECT in the last 72 hours. Thyroid Function Tests: No results for input(s): TSH, T4TOTAL, FREET4, T3FREE, THYROIDAB in the last 72 hours. Anemia Panel: No results for input(s): VITAMINB12, FOLATE, FERRITIN, TIBC, IRON, RETICCTPCT in the last 72 hours. Sepsis Labs: No results for input(s): PROCALCITON, LATICACIDVEN in the last 168 hours.  No results found for this or any previous visit (from the past 240 hour(s)).       Radiology Studies: No results found.      Scheduled Meds: . amLODipine  5 mg Oral Daily  . clobetasol cream   Topical BID  . enoxaparin (LOVENOX)  injection  40 mg Subcutaneous Q24H  . folic acid  1 mg Oral Daily  . multivitamin with minerals  1 tablet Oral Daily  . nicotine  21 mg Transdermal Daily  . thiamine  100 mg Oral Daily   Continuous Infusions:   LOS: 19 days    Time spent: 18 minutes with over 50% of the time coordinating the patient's care    Jae Dire, DO [facility] Pager [phone]  If 7PM-7AM, please contact night-coverage [email or web address] Password [email or web address] 01/01/2019, 2:35 PM

## 2019-01-02 DIAGNOSIS — R42 Dizziness and giddiness: Secondary | ICD-10-CM

## 2019-01-02 NOTE — Progress Notes (Signed)
Occupational Therapy Treatment Patient Details Name: Marie Woods MRN: [id number] DOB: 08-25-1955 Today's Date: 01/02/2019    History of present illness Patient is a 63 year old female with medical history significant of asthma, hypertension, alcohol abuse who was brought in by EMS after neighbors called in stating that patient is confused and altered. Patient admitted Severe Sepsis with Acute metabolic encephalopathy secondary to UTI.    OT comments  Pt seated in recliner chair upon entering the room. Pt needing max cuing to orient to location, situation, and time. Pt verbalized, " We at [facility]" and when told location she asked, "Why am I here?" Pt asking several times throughout session for location of keys, wallet, and phone. Pt perseverating on these items and very distracted with max cuing to redirect throughout session. Pt impulsively standing and ambulating 10' to closet with min HHA to go through personal bags in an attempt to locate. Pt returning to recliner chair at end of session with all needs within reach. She does request pain medication for headache. Pt continues to be appropriate for SNF recommendation.   Follow Up Recommendations  SNF;Supervision/Assistance - 24 hour    Equipment Recommendations  Other (comment)(defer to next venue of care)    Recommendations for Other Services      Precautions / Restrictions Precautions Precautions: Fall Precaution Comments: was on contact for bed bugs, not on on 11/16 Restrictions Weight Bearing Restrictions: No       Mobility Bed Mobility               General bed mobility comments: seated in recliner chair upon entering the room  Transfers Overall transfer level: Needs assistance Equipment used: 1 person hand held assist Transfers: Sit to/from Stand Sit to Stand: Min assist Stand pivot transfers: Min assist       General transfer comment: pt standing up without assist impulsively trying to look for "missing  items"    Balance Overall balance assessment: Needs assistance Sitting-balance support: Feet supported;No upper extremity supported Sitting balance-Leahy Scale: Good Sitting balance - Comments: static sitting balance EOB without LOB   Standing balance support: Bilateral upper extremity supported;During functional activity Standing balance-Leahy Scale: Fair Standing balance comment: BUE support on RW                           ADL either performed or assessed with clinical judgement        Vision Baseline Vision/History: Wears glasses Wears Glasses: Reading only            Cognition Arousal/Alertness: Awake/alert Behavior During Therapy: Impulsive Overall Cognitive Status: No family/caregiver present to determine baseline cognitive functioning Area of Impairment: Memory;Safety/judgement;Awareness;Orientation       Orientation Level: Disoriented to;Time;Situation;Place Current Attention Level: Sustained Memory: Decreased short-term memory Following Commands: Follows one step commands consistently Safety/Judgement: Decreased awareness of safety;Decreased awareness of deficits Awareness: Emergent Problem Solving: Difficulty sequencing;Requires verbal cues;Requires tactile cues General Comments: perseverating on finshing keys, phone and cell phone this session. Notified pt that she did not come in with those items and needing max cuing for redirection              General Comments vss    Pertinent Vitals/ Pain       Pain Assessment: 0-10 Pain Score: 4  Pain Location: headache Pain Descriptors / Indicators: Headache Pain Intervention(s): Limited activity within patient's tolerance;Patient requesting pain meds-RN notified         Frequency  Min 2X/week        Progress Toward Goals  OT Goals(current goals can now be found in the care plan section)  Progress towards OT goals: Progressing toward goals  Acute Rehab OT Goals Patient Stated Goal: "to  figure out where my stuff is" OT Goal Formulation: With patient Time For Goal Achievement: 01/16/19 Potential to Achieve Goals: Good  Plan Discharge plan remains appropriate       AM-PAC OT "6 Clicks" Daily Activity     Outcome Measure   Help from another person eating meals?: None Help from another person taking care of personal grooming?: A Little Help from another person toileting, which includes using toliet, bedpan, or urinal?: A Lot Help from another person bathing (including washing, rinsing, drying)?: A Lot Help from another person to put on and taking off regular upper body clothing?: A Little Help from another person to put on and taking off regular lower body clothing?: A Lot 6 Click Score: 16    End of Session Equipment Utilized During Treatment: Gait belt;Rolling walker  OT Visit Diagnosis: Unsteadiness on feet (R26.81);Other abnormalities of gait and mobility (R26.89);Muscle weakness (generalized) (M62.81);Other symptoms and signs involving cognitive function   Activity Tolerance Patient tolerated treatment well   Patient Left in chair;with call bell/phone within reach;with chair alarm set   Nurse Communication Patient requests pain meds;Mobility status        Time: 3149-7026 OT Time Calculation (min): 14 min  Charges: OT General Charges $OT Visit: 1 Visit OT Treatments $Self Care/Home Management : 8-22 mins   Alen Bleacher MS, OTR/L 01/02/2019, 3:16 PM

## 2019-01-02 NOTE — Progress Notes (Signed)
PROGRESS NOTE    Marie Woods    Code Status: Full Code  ZOX:[id number] DOB: [birth year] DOA: 12/13/2018  PCP: Marcine MatarJohnson, Deborah B, MD    Hospital Summary  63 year old female with history of asthma, hypertension, alcohol abuse was brought to the hospital confused and altered.  She was found to have UTI; urine culture showed multiple species.  MRI brain was suggestive of possible Wernicke's encephalopathy.  Treated with thiamine.  PT continues to recommend SNF placement.  A & P   Principal Problem:   AMS (altered mental status) Active Problems:   History of alcohol use   TOBACCO ABUSE   Essential hypertension  Lightheadedness Likely orthostasis, reportedly positional and resolves quickly -Orthostatic vitals  Tension Headache Improved with tylenol and ibuprofen  Severe sepsis secondary to polymicrobial UTI: Present on admission, currently resolved with Rocephin   Acute metabolic encephalopathy likely secondary to sepsis as above and Possible Wernicke's encephalopathy, likely underlying dementia Seems at baseline mental status.  AAO  -CT of the head showed mild atrophic changes without acute intracranial abnormality -MRI of the brain: Symmetric diffusion weighted and T2/FLAIR hyperintense signal abnormality within the medial thalami. Findings favor sequelae of Wernicke's encephalopathy There was question of patient having alcohol abuse however alcohol level less than 10. Urine drug screen negative -Patient was started on high-dose thiamine. -Discussed with neurology on 12/16/2018, Dr. Laurence SlateAroor, recommended thiamine 500 mg 3 times daily for 3 days then followed by 100 mg daily. -Vitamin B1 level elevated 2 weeks ago (330) however she has been on thiamine -Discussed with Dr. Otelia LimesLindzen on 12/19/2018, recommended continuing thiamine IV 250 mg twice daily for 3 days and then transition to 100 mg oral daily.Likely has [facility] -Outpatient follow-up with  neurology at discharge.  Essential hypertension stable -continue amlodipine which was on hold due to soft BPs  History of alcohol abuse -Continue folate, thiamine and multivitamin.     -Discontinue CIWA protocol as she has not had any Ativan  History of tobacco abuse -Continue nicotine patch  Generalized deconditioning Bedbug -Bedbug found on patient on presentation. -PT/OT recommending SNF.   PT Feel that patient does need supervision as she appears to have short-term memory impairment.  -Child psychotherapistocial worker following.  DVT prophylaxis: Lovenox Diet: Heart healthy Family Communication: No family at bedside Disposition Plan: DC pending bed availability at SNF, medically stable for discharge  Consultants  Neurology via phone  Procedures  None  Antibiotics   Anti-infectives (From admission, onward)   Start     Dose/Rate Route Frequency Ordered Stop   12/14/18 0100  cefTRIAXone (ROCEPHIN) 1 g in sodium chloride 0.9 % 100 mL IVPB  Status:  Discontinued     1 g 200 mL/hr over 30 Minutes Intravenous Every 24 hours 12/14/18 0010 12/17/18 0948           Subjective   Patient complaining that she has brief episodes of lightheadedness when she goes from laying to sitting.  Otherwise denies any complaints.  Full ROS negative  Objective   Vitals:   01/01/19 1242 01/01/19 2151 01/02/19 0925 01/02/19 1336  BP: 120/87 (!) 146/86 (!) 144/98 (!) 153/93  Pulse: 80 81 80 90  Resp: 18 17  16   Temp: 97.8 F (36.6 C) 97.9 F (36.6 C)  97.6 F (36.4 C)  TempSrc: Oral Oral  Oral  SpO2: 100% 100%  100%  Weight:      Height:        Intake/Output Summary (Last 24 hours)  at 01/02/2019 1437 Last data filed at 01/01/2019 1700 Gross per 24 hour  Intake 120 ml  Output -  Net 120 ml   Filed Weights   12/15/18 0101  Weight: 98.4 kg    Examination:  Physical Exam Vitals signs and nursing note reviewed.  Constitutional:      Appearance: Normal appearance.  HENT:     Head:  Normocephalic and atraumatic.  Eyes:     Extraocular Movements: Extraocular movements intact.     Conjunctiva/sclera: Conjunctivae normal.     Pupils: Pupils are equal, round, and reactive to light.  Cardiovascular:     Rate and Rhythm: Normal rate and regular rhythm.  Pulmonary:     Effort: Pulmonary effort is normal.     Breath sounds: Normal breath sounds.  Abdominal:     General: Abdomen is flat.     Palpations: Abdomen is soft.  Musculoskeletal:        General: No deformity.     Right lower leg: No edema.     Left lower leg: No edema.  Skin:    General: Skin is warm.  Neurological:     Mental Status: She is alert.     Comments: Likely baseline AAO x2 Conversant  Psychiatric:        Mood and Affect: Mood normal.        Behavior: Behavior normal.        Thought Content: Thought content normal.     Data Reviewed: I have personally reviewed following labs and imaging studies  CBC: Recent Labs  Lab 12/31/18 0253  WBC 6.5  HGB 11.0*  HCT 32.0*  MCV 85.8  PLT 811   Basic Metabolic Panel: Recent Labs  Lab 12/27/18 0601 12/31/18 0253  NA  --  140  K  --  3.7  CL  --  106  CO2  --  23  GLUCOSE  --  93  BUN  --  11  CREATININE 0.58 0.52  CALCIUM  --  9.5   GFR: Estimated Creatinine Clearance: 89 mL/min (by C-G formula based on SCr of 0.52 mg/dL). Liver Function Tests: No results for input(s): AST, ALT, ALKPHOS, BILITOT, PROT, ALBUMIN in the last 168 hours. No results for input(s): LIPASE, AMYLASE in the last 168 hours. Recent Labs  Lab 12/30/18 1720  AMMONIA 15   Coagulation Profile: No results for input(s): INR, PROTIME in the last 168 hours. Cardiac Enzymes: No results for input(s): CKTOTAL, CKMB, CKMBINDEX, TROPONINI in the last 168 hours. BNP (last 3 results) No results for input(s): PROBNP in the last 8760 hours. HbA1C: No results for input(s): HGBA1C in the last 72 hours. CBG: No results for input(s): GLUCAP in the last 168 hours. Lipid  Profile: No results for input(s): CHOL, HDL, LDLCALC, TRIG, CHOLHDL, LDLDIRECT in the last 72 hours. Thyroid Function Tests: No results for input(s): TSH, T4TOTAL, FREET4, T3FREE, THYROIDAB in the last 72 hours. Anemia Panel: No results for input(s): VITAMINB12, FOLATE, FERRITIN, TIBC, IRON, RETICCTPCT in the last 72 hours. Sepsis Labs: No results for input(s): PROCALCITON, LATICACIDVEN in the last 168 hours.  No results found for this or any previous visit (from the past 240 hour(s)).       Radiology Studies: No results found.      Scheduled Meds: . amLODipine  5 mg Oral Daily  . clobetasol cream   Topical BID  . enoxaparin (LOVENOX) injection  40 mg Subcutaneous Q24H  . folic acid  1 mg Oral Daily  .  multivitamin with minerals  1 tablet Oral Daily  . nicotine  21 mg Transdermal Daily  . thiamine  100 mg Oral Daily   Continuous Infusions:   LOS: 20 days    Time spent: 20 minutes with over 50% of the time coordinating the patient's care    Jae Dire, DO [facility] Pager [phone]  If 7PM-7AM, please contact night-coverage [email or web address] Password [facility] 01/02/2019, 2:37 PM

## 2019-01-02 NOTE — Progress Notes (Signed)
Physical Therapy Treatment Patient Details Name: Marie Woods MRN: [id number] DOB: [birth year] Today's Date: 01/02/2019    History of Present Illness Patient is a 63 year old female with medical history significant of asthma, hypertension, alcohol abuse who was brought in by EMS after neighbors called in stating that patient is confused and altered. Patient admitted Severe Sepsis with Acute metabolic encephalopathy secondary to UTI.     PT Comments    Pt with confusion today and perseverating on finding her missing "keys, wallet, and cell phone". Pt amb 200' with RW with min A for walker management. Due to confusion, impulsivity, and decreased insight to deficits pt unsafe to live alone. Con't to recommend [facility] unless family can provided 24/7 assist.    Follow Up Recommendations  SNF;Supervision/Assistance - 24 hour     Equipment Recommendations  Rolling walker with 5" wheels    Recommendations for Other Services       Precautions / Restrictions Precautions Precautions: Fall Precaution Comments: was on contact for bed bugs, not on on 11/16 Restrictions Weight Bearing Restrictions: No    Mobility  Bed Mobility               General bed mobility comments: pt sitting EOB upon PT arrival  Transfers Overall transfer level: Needs assistance Equipment used: Rolling walker (2 wheeled) Transfers: Sit to/from Stand Sit to Stand: Min guard         General transfer comment: pt standing up without assist impulsively trying to look for "missing items"  Ambulation/Gait Ambulation/Gait assistance: Min assist Gait Distance (Feet): 200 Feet Assistive device: Rolling walker (2 wheeled) Gait Pattern/deviations: Step-through pattern;Trunk flexed;Wide base of support;Drifts right/left Gait velocity: slow   General Gait Details: verbal cues to stay in walker and not push too far out in front, pt with noted R knee hyperextension but believe this to be baseline   Stairs              Wheelchair Mobility    Modified Rankin (Stroke Patients Only)       Balance Overall balance assessment: Needs assistance Sitting-balance support: Feet supported;No upper extremity supported Sitting balance-Leahy Scale: Good Sitting balance - Comments: static sitting balance EOB without LOB   Standing balance support: Bilateral upper extremity supported;During functional activity Standing balance-Leahy Scale: Fair Standing balance comment: BUE support on RW                            Cognition Arousal/Alertness: Awake/alert Behavior During Therapy: Impulsive Overall Cognitive Status: No family/caregiver present to determine baseline cognitive functioning Area of Impairment: Memory;Safety/judgement;Awareness;Orientation                 Orientation Level: Disoriented to;Time;Situation Current Attention Level: Sustained Memory: Decreased short-term memory Following Commands: Follows one step commands consistently Safety/Judgement: Decreased awareness of safety;Decreased awareness of deficits Awareness: Emergent Problem Solving: Difficulty sequencing;Requires verbal cues;Requires tactile cues General Comments: pt focusing on "I can't find my phone, keys, cell phone" spoke with RN, pt confused, she didn't come in with those items, pt perseverating despite being educated they aren't here      Exercises      General Comments General comments (skin integrity, edema, etc.): vss      Pertinent Vitals/Pain Pain Assessment: 0-10 Pain Score: 4  Pain Location: headache Pain Descriptors / Indicators: Headache Pain Intervention(s): Patient requesting pain meds-RN notified    Home Living  Prior Function            PT Goals (current goals can now be found in the care plan section) Progress towards PT goals: Progressing toward goals    Frequency    Min 3X/week      PT Plan Current plan remains appropriate     Co-evaluation              AM-PAC PT "6 Clicks" Mobility   Outcome Measure  Help needed turning from your back to your side while in a flat bed without using bedrails?: None Help needed moving from lying on your back to sitting on the side of a flat bed without using bedrails?: None Help needed moving to and from a bed to a chair (including a wheelchair)?: A Little Help needed standing up from a chair using your arms (e.g., wheelchair or bedside chair)?: A Little Help needed to walk in hospital room?: A Little Help needed climbing 3-5 steps with a railing? : A Lot 6 Click Score: 19    End of Session Equipment Utilized During Treatment: Gait belt Activity Tolerance: Patient tolerated treatment well Patient left: with call bell/phone within reach;in chair;with chair alarm set Nurse Communication: Mobility status PT Visit Diagnosis: Other abnormalities of gait and mobility (R26.89);Muscle weakness (generalized) (M62.81);Difficulty in walking, not elsewhere classified (R26.2)     Time: 8768-1157 PT Time Calculation (min) (ACUTE ONLY): 17 min  Charges:  $Gait Training: 8-22 mins                     Kittie Plater, PT, DPT Acute Rehabilitation Services Pager #: [phone] Office #: [phone]    Berline Lopes 01/02/2019, 1:00 PM

## 2019-01-03 LAB — CREATININE, SERUM
Creatinine, Ser: 0.65 mg/dL (ref 0.44–1.00)
GFR calc Af Amer: >60 mL/min (ref >60)
GFR calc non Af Amer: >60 mL/min (ref >60)

## 2019-01-03 MED ORDER — KETOCONAZOLE 2 % EX CREA
TOPICAL_CREAM | Freq: Two times a day (BID) | CUTANEOUS | Status: DC
  Administered 2019-01-03 – 2019-01-04 (×4): via TOPICAL
  Administered 2019-01-05: 1 via TOPICAL
  Administered 2019-01-05 – 2019-01-09 (×9): via TOPICAL
  Administered 2019-01-10: 1 via TOPICAL
  Administered 2019-01-10 – 2019-01-16 (×13): via TOPICAL
  Administered 2019-02-06 – 2019-05-15 (×15): 1 via TOPICAL
  Filled 2019-01-03 (×18): qty 15

## 2019-01-03 NOTE — Progress Notes (Signed)
Initial Nutrition Assessment  RD working remotely.  DOCUMENTATION CODES:   Obesity unspecified  INTERVENTION:   - Continue MVI with minerals daily  - Ensure Enlive po BID, each supplement provides 350 kcal and 20 grams of protein  NUTRITION DIAGNOSIS:   Increased nutrient needs related to acute illness as evidenced by estimated needs.  GOAL:   Patient will meet greater than or equal to 90% of their needs  MONITOR:   PO intake, Supplement acceptance, Labs, Weight trends  REASON FOR ASSESSMENT:   LOS    ASSESSMENT:   63 year old female who presented to the ED on 11/10 with AMS. PMH of asthma, EtOH abuse, HTN. Pt admitted with severe sepsis secondary to UTI. Pt found to have Wernicke's encephalopathy.   Pt is medically stable for d/c and is awaiting SNF placement.  No weights since 11/12.  Reviewed weight history in chart. Pt with a 6.8 kg weight loss since 03/08/18. This is a 6.5% weight loss which is not significant for timeframe.  Given variable PO intake, RD will order oral nutrition supplements to aid pt in meeting kcal and protein needs during admission.  Meal Completion: 25-100% x last 8 recorded meals (averaging 69%)  Medications reviewed and include: folic acid, MVI with minerals, thiamine  Labs reviewed.  NUTRITION - FOCUSED PHYSICAL EXAM:  Unable to complete at this time. RD working remotely.  Diet Order:   Diet Order            Diet Heart Room service appropriate? No; Fluid consistency: Thin  Diet effective now              EDUCATION NEEDS:   Not appropriate for education at this time  Skin:  Skin Assessment: Reviewed RN Assessment  Last BM:  01/03/19  Height:   Ht Readings from Last 1 Encounters:  12/15/18 5\' 7"  (1.702 m)    Weight:   Wt Readings from Last 1 Encounters:  12/15/18 98.4 kg    Ideal Body Weight:  61.4 kg  BMI:  Body mass index is 33.99 kg/m.  Estimated Nutritional Needs:   Kcal:  1900-2100  Protein:   95-110 grams  Fluid:  >/= 1.8 L    Gaynell Face, MS, RD, LDN Inpatient Clinical Dietitian Pager: [phone] Weekend/After Hours: [phone]

## 2019-01-03 NOTE — Progress Notes (Signed)
PROGRESS NOTE    Marie Woods    Code Status: Full Code  IWL:[id number] DOB: [birth year] DOA: 12/13/2018  PCP: Ladell Pier, MD    Hospital Summary  63 year old female with history of asthma, hypertension, alcohol abuse was brought to the hospital confused and altered.  She was found to have UTI; urine culture showed multiple species.  MRI brain was suggestive of possible Wernicke's encephalopathy.  Treated with thiamine.  PT continues to recommend SNF placement.  A & P   Principal Problem:   AMS (altered mental status) Active Problems:   History of alcohol use   TOBACCO ABUSE   Essential hypertension  Lightheadedness Negative orthostatic vital signs Hemoglobin dropped from 12.2 to 11.0 on 11/28 -Recheck in a.m.  Tension Headache Improved with tylenol and ibuprofen  Left lower extremity rash No improvement with clobetasol cream -Switch clobetasol for ketoconazole cream, may be fungal  Severe sepsis secondary to polymicrobial UTI:  Present on admission, currently resolved with Rocephin   Acute metabolic encephalopathy likely secondary to sepsis as above and Possible Wernicke's encephalopathy, likely underlying dementia Seems at baseline mental status.  Per nursing, waxes and wanes -CT of the head showed mild atrophic changes without acute intracranial abnormality -MRI of the brain: Symmetric diffusion weighted and T2/FLAIR hyperintense signal abnormality within the medial thalami. Findings favor sequelae of Wernicke's encephalopathy There was question of patient having alcohol abuse however alcohol level less than 10. Urine drug screen negative -Patient was started on high-dose thiamine. -Discussed with neurology on 12/16/2018, Dr. Lorraine Lax, recommended thiamine 500 mg 3 times daily for 3 days then followed by 100 mg daily. -Vitamin B1 level elevated 2 weeks ago (330) however she has been on thiamine -Discussed with Dr. Cheral Marker on 12/19/2018, recommended continuing  thiamine IV 250 mg twice daily for 3 days and then transition to 100 mg oral daily.Likely has memory impairment due to [facility]. -Outpatient follow-up with neurology at discharge.  Essential hypertension stable -continue amlodipine which was on hold due to soft BPs  History of alcohol abuse -Continue folate, thiamine and multivitamin.     -Discontinue CIWA protocol as she has not had any Ativan  History of tobacco abuse -Continue nicotine patch  Generalized deconditioning Bedbug -Bedbug found on patient on presentation. -PT/OT recommending SNF.   PT Feel that patient does need supervision as she appears to have short-term memory impairment.  -Education officer, museum following.  DVT prophylaxis: Lovenox Diet: Heart healthy Family Communication: No family at bedside Disposition Plan: DC pending bed availability at SNF, medically stable for discharge  Consultants  Neurology via phone  Procedures  None  Antibiotics   Anti-infectives (From admission, onward)   Start     Dose/Rate Route Frequency Ordered Stop   12/14/18 0100  cefTRIAXone (ROCEPHIN) 1 g in sodium chloride 0.9 % 100 mL IVPB  Status:  Discontinued     1 g 200 mL/hr over 30 Minutes Intravenous Every 24 hours 12/14/18 0010 12/17/18 0948           Subjective   Patient complaining of left leg pruritic rash as well as persistent lightheadedness which is positional.  The symptoms have been going on for couple days.  Rash is not improved with clobetasol and nurse notes it may have been worsening Patient denies any chest pain or shortness of breath or any other complaints at this time  Objective   Vitals:   01/02/19 2211 01/03/19 0438 01/03/19 1046 01/03/19 1357  BP: 138/89 (!) 131/94 (!) 137/101 122/85  Pulse: 85 82 (!) 105 (!) 102  Resp: 18 18  19   Temp: 97.7 F (36.5 C) 97.8 F (36.6 C)  (!) 97.1 F (36.2 C)  TempSrc: Oral Oral  Oral  SpO2: 100% 100% 99% 100%  Weight:      Height:         Intake/Output Summary (Last 24 hours) at 01/03/2019 1658 Last data filed at 01/03/2019 1500 Gross per 24 hour  Intake 1140 ml  Output 250 ml  Net 890 ml   Filed Weights   12/15/18 0101  Weight: 98.4 kg    Examination:  Physical Exam Vitals signs and nursing note reviewed.  Constitutional:      General: She is not in acute distress.    Appearance: She is not ill-appearing.  HENT:     Head: Normocephalic and atraumatic.     Mouth/Throat:     Mouth: Mucous membranes are moist.  Eyes:     Extraocular Movements: Extraocular movements intact.     Conjunctiva/sclera: Conjunctivae normal.     Pupils: Pupils are equal, round, and reactive to light.  Neck:     Musculoskeletal: Normal range of motion.  Cardiovascular:     Rate and Rhythm: Normal rate and regular rhythm.  Pulmonary:     Effort: Pulmonary effort is normal.     Breath sounds: Normal breath sounds.  Abdominal:     General: Abdomen is flat.     Palpations: Abdomen is soft.  Musculoskeletal:        General: No swelling.  Skin:      Neurological:     General: No focal deficit present.     Mental Status: She is alert. Mental status is at baseline.  Psychiatric:        Mood and Affect: Mood normal.        Behavior: Behavior normal.     Data Reviewed: I have personally reviewed following labs and imaging studies  CBC: Recent Labs  Lab 12/31/18 0253  WBC 6.5  HGB 11.0*  HCT 32.0*  MCV 85.8  PLT 280   Basic Metabolic Panel: Recent Labs  Lab 12/31/18 0253 01/03/19 0500  NA 140  --   K 3.7  --   CL 106  --   CO2 23  --   GLUCOSE 93  --   BUN 11  --   CREATININE 0.52 0.65  CALCIUM 9.5  --    GFR: Estimated Creatinine Clearance: 89 mL/min (by C-G formula based on SCr of 0.65 mg/dL). Liver Function Tests: No results for input(s): AST, ALT, ALKPHOS, BILITOT, PROT, ALBUMIN in the last 168 hours. No results for input(s): LIPASE, AMYLASE in the last 168 hours. Recent Labs  Lab 12/30/18 1720   AMMONIA 15   Coagulation Profile: No results for input(s): INR, PROTIME in the last 168 hours. Cardiac Enzymes: No results for input(s): CKTOTAL, CKMB, CKMBINDEX, TROPONINI in the last 168 hours. BNP (last 3 results) No results for input(s): PROBNP in the last 8760 hours. HbA1C: No results for input(s): HGBA1C in the last 72 hours. CBG: No results for input(s): GLUCAP in the last 168 hours. Lipid Profile: No results for input(s): CHOL, HDL, LDLCALC, TRIG, CHOLHDL, LDLDIRECT in the last 72 hours. Thyroid Function Tests: No results for input(s): TSH, T4TOTAL, FREET4, T3FREE, THYROIDAB in the last 72 hours. Anemia Panel: No results for input(s): VITAMINB12, FOLATE, FERRITIN, TIBC, IRON, RETICCTPCT in the last 72 hours. Sepsis Labs: No results for input(s): PROCALCITON, LATICACIDVEN in the last  168 hours.  No results found for this or any previous visit (from the past 240 hour(s)).       Radiology Studies: No results found.      Scheduled Meds: . amLODipine  5 mg Oral Daily  . enoxaparin (LOVENOX) injection  40 mg Subcutaneous Q24H  . folic acid  1 mg Oral Daily  . ketoconazole   Topical BID  . multivitamin with minerals  1 tablet Oral Daily  . nicotine  21 mg Transdermal Daily  . thiamine  100 mg Oral Daily   Continuous Infusions:   LOS: 21 days    Time spent: 25 minutes with over 50% of the time coordinating the patient's care    Jae Dire, DO [facility] Pager [phone]  If 7PM-7AM, please contact night-coverage [email or web address] Password [facility] 01/03/2019, 4:58 PM

## 2019-01-03 NOTE — TOC Progression Note (Signed)
Transition of Care [facility]) - Progression Note    Patient Details  Name: Marie Woods MRN: [id number] Date of Birth: 1955/06/25  [facility] CM/SW [place], LCSW Phone Number: 01/03/2019, 9:03 AM  Clinical Narrative:    Patient still awaiting SNF bed offer and remains on [facility] waitlist.   Expected Discharge Plan: [place] Barriers to Discharge: Continued Medical Work up, SNF Pending payor source - LOG  Expected Discharge Plan and Services Expected Discharge Plan: [place] In-house Referral: Clinical Social Work Discharge Planning Services: NA Post Acute Care Choice: [facility] Living arrangements for the past 2 months: Apartment                 DME Arranged: N/A DME Agency: NA                   Social Determinants of Health ([place]) Interventions    Readmission Risk Interventions No flowsheet data found.

## 2019-01-04 DIAGNOSIS — R5381 Other malaise: Secondary | ICD-10-CM

## 2019-01-04 LAB — BASIC METABOLIC PANEL
Anion gap: 8 (ref 5–15)
BUN: 12 mg/dL (ref 8–23)
CO2: 26 mmol/L (ref 22–32)
Calcium: 9.3 mg/dL (ref 8.9–10.3)
Chloride: 106 mmol/L (ref 98–111)
Creatinine, Ser: 0.64 mg/dL (ref 0.44–1.00)
GFR calc Af Amer: >60 mL/min (ref >60)
GFR calc non Af Amer: >60 mL/min (ref >60)
Glucose, Bld: 88 mg/dL (ref 70–99)
Potassium: 3.7 mmol/L (ref 3.5–5.1)
Sodium: 140 mmol/L (ref 135–145)

## 2019-01-04 LAB — CBC
HCT: 35.2 % — ABNORMAL LOW (ref 36.0–46.0)
Hemoglobin: 11.7 g/dL — ABNORMAL LOW (ref 12.0–15.0)
MCH: 29.2 pg (ref 26.0–34.0)
MCHC: 33.2 g/dL (ref 30.0–36.0)
MCV: 87.8 fL (ref 80.0–100.0)
Platelets: 283 10*3/uL (ref 150–400)
RBC: 4.01 MIL/uL (ref 3.87–5.11)
RDW: 14.7 % (ref 11.5–15.5)
WBC: 6.9 10*3/uL (ref 4.0–10.5)
nRBC: 0 % (ref 0.0–0.2)

## 2019-01-04 LAB — MAGNESIUM: Magnesium: 1.9 mg/dL (ref 1.7–2.4)

## 2019-01-04 NOTE — Progress Notes (Signed)
PROGRESS NOTE    Marie Woods    Code Status: Full Code  HYQ:[id number] DOB: December 16, 1955 DOA: 12/13/2018  PCP: Marcine MatarJohnson, Deborah B, MD    Hospital Summary  63 year old female with history of asthma, hypertension, alcohol abuse was brought to the hospital confused and altered.  She was found to have UTI; urine culture showed multiple species.  MRI brain was suggestive of possible Wernicke's encephalopathy.  Treated with thiamine.  PT continues to recommend SNF placement.  A & P   Principal Problem:   AMS (altered mental status) Active Problems:   History of alcohol use   TOBACCO ABUSE   Essential hypertension  Lightheadedness/imbalance, likely debility possibly with wernicke's as below Negative orthostatics, Hb improving, without UTI symptoms  -Continue with PT and SNF at discharge  Tension Headache Improved with tylenol and ibuprofen  Left lower extremity rash, concern for fungal No improvement/possibly worse with clobetasol cream -Switched clobetasol for ketoconazole cream 12/1  Severe sepsis secondary to polymicrobial UTI:  Present on admission, currently resolved with Rocephin   Acute metabolic encephalopathy likely secondary to sepsis as above and Possible Wernicke's encephalopathy, likely underlying dementia Seems at baseline mental status.  Per nursing, waxes and wanes -CT of the head showed mild atrophic changes without acute intracranial abnormality -MRI of the brain: Symmetric diffusion weighted and T2/FLAIR hyperintense signal abnormality within the medial thalami. Findings favor sequelae of Wernicke's encephalopathy There was question of patient having alcohol abuse however alcohol level less than 10. Urine drug screen negative -Patient was started on high-dose thiamine. -Discussed with neurology on 12/16/2018, Dr. Laurence SlateAroor, recommended thiamine 500 mg 3 times daily for 3 days then followed by 100 mg daily. -Vitamin B1 level elevated 2 weeks ago (330) however she  has been on thiamine -Discussed with Dr. Otelia LimesLindzen on 12/19/2018, recommended continuing thiamine IV 250 mg twice daily for 3 days and then transition to 100 mg oral daily.Likely has memory impairment [facility]. -Outpatient follow-up with neurology at discharge.  Essential hypertension stable -continue amlodipine   History of alcohol abuse Off CIWA protocol -Continue folate, thiamine and multivitamin.      History of tobacco abuse -Continue nicotine patch  Generalized deconditioning Bedbug -Bedbug found on patient on presentation. -PT/OT recommending SNF.   PT Feel that patient does need supervision as she appears to have short-term memory impairment.  -Child psychotherapistocial worker following.  DVT prophylaxis: Lovenox Diet: Heart healthy Family Communication: will discuss with family later today.  Disposition Plan: DC pending bed availability at SNF, medically stable for discharge  Consultants  Neurology via phone  Procedures  None  Antibiotics   Anti-infectives (From admission, onward)   Start     Dose/Rate Route Frequency Ordered Stop   12/14/18 0100  cefTRIAXone (ROCEPHIN) 1 g in sodium chloride 0.9 % 100 mL IVPB  Status:  Discontinued     1 g 200 mL/hr over 30 Minutes Intravenous Every 24 hours 12/14/18 0010 12/17/18 0948           Subjective   Patient seen and examined at bedside no acute distress and resting comfortably.  No events overnight.  Tolerating diet. Denies much improvement in rash since starting ketoconazole cream yesterday. Denies any urinary complaints.   Denies any chest pain, shortness of breath, nausea, vomiting. Otherwise ROS negative   Objective   Vitals:   01/03/19 1046 01/03/19 1357 01/03/19 2155 01/04/19 0450  BP: (!) 137/101 122/85 (!) 145/89 123/80  Pulse: (!) 105 (!) 102 95 83  Resp:  19 18 18   Temp:  (!) 97.1 F (36.2 C) 98.4 F (36.9 C) 98.2 F (36.8 C)  TempSrc:  Oral Oral Oral  SpO2: 99% 100% 99% 100%  Weight:       Height:        Intake/Output Summary (Last 24 hours) at 01/04/2019 0816 Last data filed at 01/03/2019 1900 Gross per 24 hour  Intake 1080 ml  Output -  Net 1080 ml   Filed Weights   12/15/18 0101  Weight: 98.4 kg    Examination:  Physical Exam Vitals signs and nursing note reviewed.  Constitutional:      Appearance: Normal appearance.  HENT:     Head: Normocephalic and atraumatic.  Cardiovascular:     Rate and Rhythm: Normal rate and regular rhythm.  Pulmonary:     Effort: Pulmonary effort is normal.     Breath sounds: Normal breath sounds.  Abdominal:     General: Abdomen is flat.     Palpations: Abdomen is soft.  Musculoskeletal: Normal range of motion.        General: No swelling.  Neurological:     Mental Status: She is alert. Mental status is at baseline.  Psychiatric:        Mood and Affect: Mood normal.        Behavior: Behavior normal.     Data Reviewed: I have personally reviewed following labs and imaging studies  CBC: Recent Labs  Lab 12/31/18 0253 01/04/19 0222  WBC 6.5 6.9  HGB 11.0* 11.7*  HCT 32.0* 35.2*  MCV 85.8 87.8  PLT 280 035   Basic Metabolic Panel: Recent Labs  Lab 12/31/18 0253 01/03/19 0500 01/04/19 0222  NA 140  --  140  K 3.7  --  3.7  CL 106  --  106  CO2 23  --  26  GLUCOSE 93  --  88  BUN 11  --  12  CREATININE 0.52 0.65 0.64  CALCIUM 9.5  --  9.3  MG  --   --  1.9   GFR: Estimated Creatinine Clearance: 89 mL/min (by C-G formula based on SCr of 0.64 mg/dL). Liver Function Tests: No results for input(s): AST, ALT, ALKPHOS, BILITOT, PROT, ALBUMIN in the last 168 hours. No results for input(s): LIPASE, AMYLASE in the last 168 hours. Recent Labs  Lab 12/30/18 1720  AMMONIA 15   Coagulation Profile: No results for input(s): INR, PROTIME in the last 168 hours. Cardiac Enzymes: No results for input(s): CKTOTAL, CKMB, CKMBINDEX, TROPONINI in the last 168 hours. BNP (last 3 results) No results for input(s):  PROBNP in the last 8760 hours. HbA1C: No results for input(s): HGBA1C in the last 72 hours. CBG: No results for input(s): GLUCAP in the last 168 hours. Lipid Profile: No results for input(s): CHOL, HDL, LDLCALC, TRIG, CHOLHDL, LDLDIRECT in the last 72 hours. Thyroid Function Tests: No results for input(s): TSH, T4TOTAL, FREET4, T3FREE, THYROIDAB in the last 72 hours. Anemia Panel: No results for input(s): VITAMINB12, FOLATE, FERRITIN, TIBC, IRON, RETICCTPCT in the last 72 hours. Sepsis Labs: No results for input(s): PROCALCITON, LATICACIDVEN in the last 168 hours.  No results found for this or any previous visit (from the past 240 hour(s)).       Radiology Studies: No results found.      Scheduled Meds: . amLODipine  5 mg Oral Daily  . enoxaparin (LOVENOX) injection  40 mg Subcutaneous Q24H  . folic acid  1 mg Oral Daily  . ketoconazole  Topical BID  . multivitamin with minerals  1 tablet Oral Daily  . nicotine  21 mg Transdermal Daily  . thiamine  100 mg Oral Daily   Continuous Infusions:   LOS: 22 days    Time spent: 15  minutes with over 50% of the time coordinating the patient's care    Jae Dire, DO [facility] Pager [phone]  If 7PM-7AM, please contact night-coverage [email or web address] Password [email or web address] 01/04/2019, 8:16 AM

## 2019-01-04 NOTE — Progress Notes (Signed)
PT Cancellation Note  Patient Details Name: Marie Woods MRN: [id number] DOB: 01-Feb-1956   Cancelled Treatment:     Pt refused to participate in therapy at this time, reporting "I'm just going to chill here" and unwilling to get OOB despite education/encouragement. Will f/u per POC.   Waunita Schooner 01/04/2019, 2:05 PM

## 2019-01-05 DIAGNOSIS — G44219 Episodic tension-type headache, not intractable: Secondary | ICD-10-CM

## 2019-01-05 MED ORDER — ACETAMINOPHEN 325 MG PO TABS
650.0000 mg | ORAL_TABLET | Freq: Four times a day (QID) | ORAL | Status: DC | PRN
  Administered 2019-01-05 – 2019-05-17 (×93): 650 mg via ORAL
  Filled 2019-01-05 (×100): qty 2

## 2019-01-05 MED ORDER — MAGNESIUM SULFATE 2 GM/50ML IV SOLN: 2.0000 g | Freq: Once | INTRAVENOUS | Status: DC

## 2019-01-05 MED ORDER — ACETAMINOPHEN 650 MG RE SUPP: 650.0000 mg | Freq: Four times a day (QID) | RECTAL | Status: DC | PRN

## 2019-01-05 NOTE — Progress Notes (Signed)
PROGRESS NOTE    Marie Woods    Code Status: Full Code  BJY:[id number] DOB: 09-21-1955 DOA: 12/13/2018  PCP: Marcine MatarJohnson, Deborah B, MD    Hospital Summary  63 year old female with history of asthma, hypertension, alcohol abuse was brought to the hospital confused and altered.  She was found to have UTI; urine culture showed multiple species.  MRI brain was suggestive of possible Wernicke's encephalopathy.  Treated with thiamine.  PT continues to recommend SNF placement.  A & P   Principal Problem:   AMS (altered mental status) Active Problems:   History of alcohol use   TOBACCO ABUSE   Essential hypertension  Frontal headache without red flags CT head 11/10 unremarkable Reported to feel like she had elevated blood pressure, BP is elevated at 151/97.  Had recently received her amlodipine just prior to my arrival -Reevaluate once BP is improved  Left lower extremity rash, concern for fungal Some improvement with ketoconazole cream -Switched clobetasol for ketoconazole cream 12/1  Severe sepsis secondary to polymicrobial UTI:  Present on admission, currently resolved with Rocephin   Acute metabolic encephalopathy likely secondary to sepsis and Possible Wernicke's encephalopathy, likely underlying dementia  Seems at baseline mental status.  Per nursing, waxes and wanes -CT of the head showed mild atrophic changes without acute intracranial abnormality -MRI of the brain: Symmetric diffusion weighted and T2/FLAIR hyperintense signal abnormality within the medial thalami. Findings favor sequelae of Wernicke's encephalopathy There was question of patient having alcohol abuse however alcohol level less than 10. Urine drug screen negative -Patient was started on high-dose thiamine. -Discussed with neurology on 12/16/2018, Dr. Laurence SlateAroor, recommended thiamine 500 mg 3 times daily for 3 days then followed by 100 mg daily. -Vitamin B1 level elevated 2 weeks ago (330) however she has been on  thiamine -Discussed with Dr. Otelia LimesLindzen on 12/19/2018, recommended continuing thiamine IV 250 mg twice daily for 3 days and then transition to 100 mg oral daily.Likely has memory impairment due [facility]. -Outpatient follow-up with neurology at discharge. -Delirium precautions -Oriented bedside  Essential hypertension Hypertensive this a.m. -continue amlodipine and follow-up  History of alcohol abuse Off CIWA protocol -Continue folate, thiamine and multivitamin.      History of tobacco abuse -Continue nicotine patch  Generalized deconditioning Bedbug -Bedbug found on patient on presentation. -PT/OT recommending SNF.   PT Feel that patient does need supervision as she appears to have short-term memory impairment.  -Child psychotherapistocial worker following.  DVT prophylaxis: Lovenox Diet: Heart healthy Family Communication: Discussed with niece over the phone Disposition Plan: DC pending bed availability at SNF, difficult placement.  Medically stable for discharge  Consultants  Neurology via phone  Procedures  None  Antibiotics   Anti-infectives (From admission, onward)   Start     Dose/Rate Route Frequency Ordered Stop   12/14/18 0100  cefTRIAXone (ROCEPHIN) 1 g in sodium chloride 0.9 % 100 mL IVPB  Status:  Discontinued     1 g 200 mL/hr over 30 Minutes Intravenous Every 24 hours 12/14/18 0010 12/17/18 0948           Subjective   She is seen and examined at bedside lying flat and resting comfortably.  Admits to having a persistent frontal headache and feels like "my blood pressure is high "upon further review she was noted to be hypertensive and had only recently received her amlodipine prior to my arrival.  She denied any phonophobia, photophobia, neck stiffness, nausea, vomiting, chest pain palpitations.  Denied any other complaints.  Objective   Vitals:   01/04/19 0848 01/04/19 1638 01/04/19 2222 01/05/19 0825  BP: 118/85 119/84 101/64 (!) 151/97  Pulse:  96 85    Resp:  16    Temp:  98.9 F (37.2 C) 98.6 F (37 C)   TempSrc:  Oral Oral   SpO2:  95% 100%   Weight:      Height:       No intake or output data in the 24 hours ending 01/05/19 1300 Filed Weights   12/15/18 0101  Weight: 98.4 kg    Examination:  Physical Exam Vitals signs and nursing note reviewed.  HENT:     Head: Normocephalic and atraumatic.     Comments: Frontal and maxillary sinuses nontender to palpation    Nose: Nose normal.     Mouth/Throat:     Mouth: Mucous membranes are moist.  Eyes:     Extraocular Movements: Extraocular movements intact.  Neck:     Musculoskeletal: Normal range of motion. No neck rigidity.  Cardiovascular:     Rate and Rhythm: Normal rate and regular rhythm.  Pulmonary:     Effort: Pulmonary effort is normal.     Breath sounds: Normal breath sounds.  Abdominal:     General: Abdomen is flat.     Palpations: Abdomen is soft.  Musculoskeletal: Normal range of motion.  Skin:    General: Skin is warm.     Comments: Left lower extremity maculopapular rash, white with some scales, thickened skin  improved  Neurological:     Mental Status: She is alert and oriented to person, place, and time.  Psychiatric:        Mood and Affect: Mood normal.        Behavior: Behavior normal.        Thought Content: Thought content normal.        Judgment: Judgment normal.     Data Reviewed: I have personally reviewed following labs and imaging studies  CBC: Recent Labs  Lab 12/31/18 0253 01/04/19 0222  WBC 6.5 6.9  HGB 11.0* 11.7*  HCT 32.0* 35.2*  MCV 85.8 87.8  PLT 280 283   Basic Metabolic Panel: Recent Labs  Lab 12/31/18 0253 01/03/19 0500 01/04/19 0222  NA 140  --  140  K 3.7  --  3.7  CL 106  --  106  CO2 23  --  26  GLUCOSE 93  --  88  BUN 11  --  12  CREATININE 0.52 0.65 0.64  CALCIUM 9.5  --  9.3  MG  --   --  1.9   GFR: Estimated Creatinine Clearance: 89 mL/min (by C-G formula based on SCr of 0.64 mg/dL). Liver  Function Tests: No results for input(s): AST, ALT, ALKPHOS, BILITOT, PROT, ALBUMIN in the last 168 hours. No results for input(s): LIPASE, AMYLASE in the last 168 hours. Recent Labs  Lab 12/30/18 1720  AMMONIA 15   Coagulation Profile: No results for input(s): INR, PROTIME in the last 168 hours. Cardiac Enzymes: No results for input(s): CKTOTAL, CKMB, CKMBINDEX, TROPONINI in the last 168 hours. BNP (last 3 results) No results for input(s): PROBNP in the last 8760 hours. HbA1C: No results for input(s): HGBA1C in the last 72 hours. CBG: No results for input(s): GLUCAP in the last 168 hours. Lipid Profile: No results for input(s): CHOL, HDL, LDLCALC, TRIG, CHOLHDL, LDLDIRECT in the last 72 hours. Thyroid Function Tests: No results for input(s): TSH, T4TOTAL, FREET4, T3FREE, THYROIDAB in the last  72 hours. Anemia Panel: No results for input(s): VITAMINB12, FOLATE, FERRITIN, TIBC, IRON, RETICCTPCT in the last 72 hours. Sepsis Labs: No results for input(s): PROCALCITON, LATICACIDVEN in the last 168 hours.  No results found for this or any previous visit (from the past 240 hour(s)).       Radiology Studies: No results found.      Scheduled Meds: . amLODipine  5 mg Oral Daily  . enoxaparin (LOVENOX) injection  40 mg Subcutaneous Q24H  . folic acid  1 mg Oral Daily  . ketoconazole   Topical BID  . multivitamin with minerals  1 tablet Oral Daily  . nicotine  21 mg Transdermal Daily  . thiamine  100 mg Oral Daily   Continuous Infusions:   LOS: 23 days    Time spent: 22  minutes with over 50% of the time coordinating the patient's care    Harold Hedge, DO [facility] Pager [phone]  If 7PM-7AM, please contact night-coverage [email or web address] Password [email or web address] 01/05/2019, 1:00 PM

## 2019-01-05 NOTE — Plan of Care (Signed)
  Problem: Clinical Measurements: Goal: Will remain free from infection Outcome: Progressing Goal: Diagnostic test results will improve Outcome: Progressing Goal: Respiratory complications will improve Outcome: Progressing Goal: Cardiovascular complication will be avoided Outcome: Progressing   Problem: Activity: Goal: Risk for activity intolerance will decrease Outcome: Progressing   

## 2019-01-05 NOTE — TOC Progression Note (Signed)
Transition of Care [facility]) - Progression Note    Patient Details  Name: Marie Woods MRN: [id number] Date of Birth: December 18, 1955  Transition of [facility]) CM/SW [place], [place] Phone Number: 01/05/2019, 5:34 PM  Clinical Narrative:    Patient remains on SNF Difficult to Place list with no current bed offers.    Expected Discharge Plan: [place] Barriers to Discharge: Continued Medical Work up, SNF Pending payor source - LOG  Expected Discharge Plan and Services Expected Discharge Plan: [place] In-house Referral: Clinical Social Work Discharge Planning Services: NA Post Acute Care Choice: [place] Living arrangements for the past 2 months: Apartment                 DME Arranged: N/A DME Agency: NA                   Social Determinants of Health (SDOH) Interventions    Readmission Risk Interventions No flowsheet data found.

## 2019-01-05 NOTE — Progress Notes (Signed)
PT Cancellation Note  Patient Details Name: Marie Woods MRN: [id number] DOB: 06/15/1955   Cancelled Treatment:    Reason Eval/Treat Not Completed: Other (comment) patient asleep snoring soundly, not waking to verbal or tactile stimulation. Will try to attempt later on if time/schedule allow.   Windell Norfolk, DPT, PN1   Supplemental Physical Therapist [facility]    Pager [phone] Acute Rehab Office [phone]

## 2019-01-06 DIAGNOSIS — E512 Wernicke's encephalopathy: Secondary | ICD-10-CM

## 2019-01-06 LAB — MAGNESIUM: Magnesium: 2 mg/dL (ref 1.7–2.4)

## 2019-01-06 LAB — CBC
HCT: 34.7 % — ABNORMAL LOW (ref 36.0–46.0)
Hemoglobin: 11.7 g/dL — ABNORMAL LOW (ref 12.0–15.0)
MCH: 29 pg (ref 26.0–34.0)
MCHC: 33.7 g/dL (ref 30.0–36.0)
MCV: 85.9 fL (ref 80.0–100.0)
Platelets: 270 10*3/uL (ref 150–400)
RBC: 4.04 MIL/uL (ref 3.87–5.11)
RDW: 14.4 % (ref 11.5–15.5)
WBC: 6.4 10*3/uL (ref 4.0–10.5)
nRBC: 0 % (ref 0.0–0.2)

## 2019-01-06 LAB — BASIC METABOLIC PANEL
Anion gap: 10 (ref 5–15)
BUN: 9 mg/dL (ref 8–23)
CO2: 23 mmol/L (ref 22–32)
Calcium: 9.4 mg/dL (ref 8.9–10.3)
Chloride: 106 mmol/L (ref 98–111)
Creatinine, Ser: 0.55 mg/dL (ref 0.44–1.00)
GFR calc Af Amer: >60 mL/min (ref >60)
GFR calc non Af Amer: >60 mL/min (ref >60)
Glucose, Bld: 87 mg/dL (ref 70–99)
Potassium: 3.5 mmol/L (ref 3.5–5.1)
Sodium: 139 mmol/L (ref 135–145)

## 2019-01-06 MED ORDER — TRAZODONE HCL 50 MG PO TABS
50.0000 mg | ORAL_TABLET | Freq: Once | ORAL | Status: AC
  Administered 2019-01-06: 50 mg via ORAL
  Filled 2019-01-06: qty 1

## 2019-01-06 MED ORDER — HYDROCERIN EX CREA
2.0000 "application " | TOPICAL_CREAM | CUTANEOUS | Status: DC | PRN
  Administered 2019-01-06 – 2019-04-20 (×7): 2 via TOPICAL
  Filled 2019-01-06: qty 113

## 2019-01-06 NOTE — Progress Notes (Signed)
PROGRESS NOTE    Marie Woods    Code Status: Full Code  [id number] DOB: 1955-05-29 DOA: 12/13/2018  PCP: Marcine MatarJohnson, Deborah B, MD    Hospital Summary  63 year old female with history of asthma, hypertension, alcohol abuse was brought to the hospital confused and altered.  She was found to have UTI; urine culture showed multiple species.  MRI brain was suggestive of possible Wernicke's encephalopathy.  Treated with thiamine.  PT continues to recommend SNF placement.  A & P   Principal Problem:   AMS (altered mental status) Active Problems:   History of alcohol use   TOBACCO ABUSE   Essential hypertension  Frontal headache without red flags CT head 11/10 unremarkable Her headache has resolved.  BP improved today.   Left lower extremity rash, concern for fungal Some improvement with ketoconazole cream -Switched clobetasol for ketoconazole cream 12/1  Severe sepsis secondary to polymicrobial UTI:  Present on admission, currently resolved with Rocephin   Acute metabolic encephalopathy likely secondary to sepsis and Possible Wernicke's encephalopathy, likely underlying dementia  Seems at baseline mental status.  Per nursing, waxes and wanes -CT of the head showed mild atrophic changes without acute intracranial abnormality -MRI of the brain: Symmetric diffusion weighted and T2/FLAIR hyperintense signal abnormality within the medial thalami. Findings favor sequelae of Wernicke's encephalopathy There was question of patient having alcohol abuse however alcohol level less than 10. Urine drug screen negative -Patient was started on high-dose thiamine. -Discussed with neurology on 12/16/2018, Dr. Laurence SlateAroor, recommended thiamine 500 mg 3 times daily for 3 days then followed by 100 mg daily. -Vitamin B1 level elevated 2 weeks ago (330) however she has been on thiamine -Discussed with Dr. Otelia LimesLindzen on 12/19/2018, recommended continuing thiamine IV 250 mg twice daily for 3 days and then  transition to 100 mg oral daily. She remains on thiamine 100 mg p.o. daily currently.Likely has memory impairment [facility]. -Outpatient follow-up with neurology at discharge. -Delirium precautions -Oriented bedside -Needs placement  Essential hypertension BP stable -continue amlodipine   History of alcohol abuse Off CIWA protocol -Continue folate, thiamine and multivitamin.      History of tobacco abuse -Continue nicotine patch  Generalized deconditioning Bedbug -Bedbug found on patient on presentation. -PT/OT recommending SNF.   PT Feel that patient does need supervision as she appears to have short-term memory impairment.  -Child psychotherapistocial worker following.  DVT prophylaxis: Lovenox Diet: Heart healthy Family Communication: None available today Disposition Plan: DC pending bed availability at SNF, difficult placement.  Medically stable for discharge  Consultants  Neurology via phone  Procedures  None  Antibiotics   Anti-infectives (From admission, onward)   Start     Dose/Rate Route Frequency Ordered Stop   12/14/18 0100  cefTRIAXone (ROCEPHIN) 1 g in sodium chloride 0.9 % 100 mL IVPB  Status:  Discontinued     1 g 200 mL/hr over 30 Minutes Intravenous Every 24 hours 12/14/18 0010 12/17/18 0948           Subjective   Patient offers no complaints. Reports that she had a good night of sleep. Her appetite is reported as improving.   Objective   Vitals:   01/05/19 1349 01/05/19 2209 01/06/19 0534 01/06/19 0845  BP: 137/84 121/82 108/74 127/76  Pulse:  92 89   Resp:  14 16   Temp:  98.5 F (36.9 C) 98.3 F (36.8 C)   TempSrc:  Oral Oral   SpO2:  95% 97%   Weight:  Height:        Intake/Output Summary (Last 24 hours) at 01/06/2019 1445 Last data filed at 01/06/2019 0100 Gross per 24 hour  Intake 600 ml  Output -  Net 600 ml   Filed Weights   12/15/18 0101  Weight: 98.4 kg    Examination:  Physical Exam Vitals signs and nursing note  reviewed.  HENT:     Head: Normocephalic and atraumatic.     Nose: Nose normal.     Mouth/Throat:     Mouth: Mucous membranes are moist.  Eyes:     Extraocular Movements: Extraocular movements intact.  Neck:     Musculoskeletal: Normal range of motion. No neck rigidity.  Cardiovascular:     Rate and Rhythm: Normal rate and regular rhythm.  Pulmonary:     Effort: Pulmonary effort is normal.     Breath sounds: Normal breath sounds.  Abdominal:     General: Abdomen is flat.     Palpations: Abdomen is soft.  Musculoskeletal: Normal range of motion.  Skin:    General: Skin is warm.     Comments: Left lower extremity maculopapular rash, white with some scales, thickened skin  improved  Neurological:     Mental Status: She is alert.     Comments: Alerted to place,  person, and situation  Psychiatric:        Mood and Affect: Mood normal.        Behavior: Behavior normal.        Thought Content: Thought content normal.        Judgment: Judgment normal.     Data Reviewed: I have personally reviewed following labs and imaging studies  CBC: Recent Labs  Lab 12/31/18 0253 01/04/19 0222 01/06/19 0151  WBC 6.5 6.9 6.4  HGB 11.0* 11.7* 11.7*  HCT 32.0* 35.2* 34.7*  MCV 85.8 87.8 85.9  PLT 280 283 270   Basic Metabolic Panel: Recent Labs  Lab 12/31/18 0253 01/03/19 0500 01/04/19 0222 01/06/19 0151  NA 140  --  140 139  K 3.7  --  3.7 3.5  CL 106  --  106 106  CO2 23  --  26 23  GLUCOSE 93  --  88 87  BUN 11  --  12 9  CREATININE 0.52 0.65 0.64 0.55  CALCIUM 9.5  --  9.3 9.4  MG  --   --  1.9 2.0   GFR: Estimated Creatinine Clearance: 89 mL/min (by C-G formula based on SCr of 0.55 mg/dL). Liver Function Tests: No results for input(s): AST, ALT, ALKPHOS, BILITOT, PROT, ALBUMIN in the last 168 hours. No results for input(s): LIPASE, AMYLASE in the last 168 hours. Recent Labs  Lab 12/30/18 1720  AMMONIA 15   Coagulation Profile: No results for input(s): INR,  PROTIME in the last 168 hours. Cardiac Enzymes: No results for input(s): CKTOTAL, CKMB, CKMBINDEX, TROPONINI in the last 168 hours. BNP (last 3 results) No results for input(s): PROBNP in the last 8760 hours. HbA1C: No results for input(s): HGBA1C in the last 72 hours. CBG: No results for input(s): GLUCAP in the last 168 hours. Lipid Profile: No results for input(s): CHOL, HDL, LDLCALC, TRIG, CHOLHDL, LDLDIRECT in the last 72 hours. Thyroid Function Tests: No results for input(s): TSH, T4TOTAL, FREET4, T3FREE, THYROIDAB in the last 72 hours. Anemia Panel: No results for input(s): VITAMINB12, FOLATE, FERRITIN, TIBC, IRON, RETICCTPCT in the last 72 hours. Sepsis Labs: No results for input(s): PROCALCITON, LATICACIDVEN in the last 168 hours.  No results found for this or any previous visit (from the past 240 hour(s)).       Radiology Studies: No results found.      Scheduled Meds: . amLODipine  5 mg Oral Daily  . enoxaparin (LOVENOX) injection  40 mg Subcutaneous Q24H  . folic acid  1 mg Oral Daily  . ketoconazole   Topical BID  . multivitamin with minerals  1 tablet Oral Daily  . nicotine  21 mg Transdermal Daily  . thiamine  100 mg Oral Daily   Continuous Infusions:   LOS: 24 days    Time spent: 25 minutes with over 50% of the time coordinating the patient's care    Blain Pais, MD [facility]   If 7PM-7AM, please contact night-coverage [email or web address] Password [email or web address] 01/06/2019, 2:45 PM

## 2019-01-06 NOTE — Progress Notes (Signed)
Patient noted to have arrived to unit. Pt stated they just came within a few minutes.

## 2019-01-06 NOTE — Progress Notes (Signed)
Noted pt skin on legs with some dryness, compromised. Pt states secondary to eczema. MD communication sent to determine if medicated cream would be appropriate.

## 2019-01-06 NOTE — Progress Notes (Signed)
Occupational Therapy Treatment Patient Details Name: Marie Woods MRN: [id number] DOB: 01/03/1956 Today's Date: 01/06/2019    History of present illness Patient is a 63 year old female with medical history significant of asthma, hypertension, alcohol abuse who was brought in by EMS after neighbors called in stating that patient is confused and altered. Patient admitted Severe Sepsis with Acute metabolic encephalopathy secondary to UTI.    OT comments  Patient supine upon arrival, agreeable to OT. Patient requires min A with ambulation with verbal cues for safety using rolling walker. Patient pushes walker too far forward and pushes off to the side when exiting the bathroom. Min A for toilet transfer with verbal cues for safety to utilize grab bar as patient has limited eccentric control when sitting and difficulty standing from toilet. Patient min guard standing at sink to perform grooming/hygiene, holds onto sink for stability.    Follow Up Recommendations  SNF;Supervision/Assistance - 24 hour    Equipment Recommendations  Other (comment)(defer to next venue)       Precautions / Restrictions Precautions Precautions: Fall Restrictions Weight Bearing Restrictions: No       Mobility Bed Mobility Overal bed mobility: Needs Assistance Bed Mobility: Supine to Sit;Sit to Supine     Supine to sit: Supervision Sit to supine: Supervision   General bed mobility comments: increased time for bed mobility  Transfers Overall transfer level: Needs assistance Equipment used: Rolling walker (2 wheeled) Transfers: Sit to/from Stand Sit to Stand: Min assist         General transfer comment: patient impulsive, lets go of walker twice requiring cues for safety    Balance Overall balance assessment: Needs assistance Sitting-balance support: Feet supported;No upper extremity supported Sitting balance-Leahy Scale: Good Sitting balance - Comments: static sitting balance EOB without LOB    Standing balance support: Bilateral upper extremity supported;During functional activity Standing balance-Leahy Scale: Fair Standing balance comment: BUE support on RW                           ADL either performed or assessed with clinical judgement   ADL Overall ADL's : Needs assistance/impaired     Grooming: Brushing hair;Oral care;Min guard;Standing Grooming Details (indicate cue type and reason): holding onto sink for support, min guard for safety                 Toilet Transfer: Minimal assistance;Ambulation;Cueing for safety;Grab bars;RW Toilet Transfer Details (indicate cue type and reason): verbal cues to use grab bar to assist with standing, limited eccentric control when sitting onto toilet Toileting- Clothing Manipulation and Hygiene: Minimal assistance;Sit to/from stand Toileting - Clothing Manipulation Details (indicate cue type and reason): peri care seated on toilet after voiding, min A in standing for safety with balance to pull up pants     Functional mobility during ADLs: Minimal assistance;Rolling walker General ADL Comments: decreased safety with use of rolling walker               Cognition Arousal/Alertness: Awake/alert Behavior During Therapy: Impulsive Overall Cognitive Status: No family/caregiver present to determine baseline cognitive functioning Area of Impairment: Following commands;Safety/judgement                       Following Commands: Follows one step commands inconsistently Safety/Judgement: Decreased awareness of safety;Decreased awareness of deficits  Pertinent Vitals/ Pain       Pain Assessment: 0-10 Pain Score: 7  Pain Location: headache Pain Descriptors / Indicators: Headache Pain Intervention(s): Patient requesting pain meds-RN notified      Frequency  Min 2X/week        Progress Toward Goals  OT Goals(current goals can now be found in the care plan section)   Progress towards OT goals: Progressing toward goals  Acute Rehab OT Goals Patient Stated Goal: to use the bathroom OT Goal Formulation: With patient Time For Goal Achievement: 01/16/19 Potential to Achieve Goals: Good ADL Goals Pt Will Perform Lower Body Dressing: with modified independence;sit to/from stand Pt Will Transfer to Toilet: with modified independence;ambulating;regular height toilet Pt Will Perform Toileting - Clothing Manipulation and hygiene: with modified independence;sit to/from stand Additional ADL Goal #1: Patient will complete medication management tasks with zero errors in 2/3 trials.  Plan Discharge plan remains appropriate       AM-PAC OT "6 Clicks" Daily Activity     Outcome Measure   Help from another person eating meals?: None Help from another person taking care of personal grooming?: A Little Help from another person toileting, which includes using toliet, bedpan, or urinal?: A Little Help from another person bathing (including washing, rinsing, drying)?: A Lot Help from another person to put on and taking off regular upper body clothing?: A Little Help from another person to put on and taking off regular lower body clothing?: A Lot 6 Click Score: 17    End of Session Equipment Utilized During Treatment: Rolling walker  OT Visit Diagnosis: Unsteadiness on feet (R26.81);Other abnormalities of gait and mobility (R26.89);Muscle weakness (generalized) (M62.81);Other symptoms and signs involving cognitive function   Activity Tolerance Patient tolerated treatment well   Patient Left in bed;with call bell/phone within reach;with bed alarm set   Nurse Communication Patient requests pain meds;Mobility status        Time: 7654-6503 OT Time Calculation (min): 24 min  Charges: OT General Charges $OT Visit: 1 Visit OT Treatments $Self Care/Home Management : 23-37 mins  Myrtie Neither OT OT office: [phone]   Carmelia Roller 01/06/2019, 1:15 PM

## 2019-01-07 LAB — BASIC METABOLIC PANEL
Anion gap: 12 (ref 5–15)
BUN: 11 mg/dL (ref 8–23)
CO2: 22 mmol/L (ref 22–32)
Calcium: 9.3 mg/dL (ref 8.9–10.3)
Chloride: 105 mmol/L (ref 98–111)
Creatinine, Ser: 0.7 mg/dL (ref 0.44–1.00)
GFR calc Af Amer: >60 mL/min (ref >60)
GFR calc non Af Amer: >60 mL/min (ref >60)
Glucose, Bld: 93 mg/dL (ref 70–99)
Potassium: 3.5 mmol/L (ref 3.5–5.1)
Sodium: 139 mmol/L (ref 135–145)

## 2019-01-07 LAB — CBC
HCT: 35.4 % — ABNORMAL LOW (ref 36.0–46.0)
Hemoglobin: 12 g/dL (ref 12.0–15.0)
MCH: 29.3 pg (ref 26.0–34.0)
MCHC: 33.9 g/dL (ref 30.0–36.0)
MCV: 86.6 fL (ref 80.0–100.0)
Platelets: 275 10*3/uL (ref 150–400)
RBC: 4.09 MIL/uL (ref 3.87–5.11)
RDW: 14.4 % (ref 11.5–15.5)
WBC: 6.6 10*3/uL (ref 4.0–10.5)
nRBC: 0 % (ref 0.0–0.2)

## 2019-01-07 MED ORDER — ALUM & MAG HYDROXIDE-SIMETH 200-200-20 MG/5ML PO SUSP
30.0000 mL | Freq: Four times a day (QID) | ORAL | Status: DC | PRN
  Administered 2019-02-25 – 2019-04-13 (×6): 30 mL via ORAL
  Filled 2019-01-07 (×8): qty 30

## 2019-01-07 MED ORDER — MECLIZINE HCL 12.5 MG PO TABS
12.5000 mg | ORAL_TABLET | Freq: Three times a day (TID) | ORAL | Status: DC | PRN
  Administered 2019-01-07 – 2019-01-09 (×2): 12.5 mg via ORAL
  Filled 2019-01-07 (×3): qty 1

## 2019-01-07 NOTE — Progress Notes (Signed)
PROGRESS NOTE    Marie Woods    Code Status: Full Code  AGT:[id number] DOB: 12-21-1955 DOA: 12/13/2018  PCP: Marcine Matar, MD    Hospital Summary  63 year old female with history of asthma, hypertension, alcohol abuse was brought to the hospital confused and altered.  She was found to have UTI; urine culture showed multiple species.  MRI brain was suggestive of possible Wernicke's encephalopathy.  Treated with thiamine.  PT continues to recommend SNF placement.  A & P   Principal Problem:   AMS (altered mental status) Active Problems:   History of alcohol use   TOBACCO ABUSE   Essential hypertension  Frontal headache without red flags CT head 11/10 unremarkable Her headache has resolved.  BP stable.    Left lower extremity rash, concern for fungal Some improvement with ketoconazole cream -Switched clobetasol for ketoconazole cream 12/1 -Continue Eucerin cream.   Severe sepsis secondary to polymicrobial UTI:  Present on admission, currently resolved with Rocephin   Acute metabolic encephalopathy likely secondary to sepsis and Possible Wernicke's encephalopathy, likely underlying dementia  Seems at baseline mental status.  Per nursing, short-term memory waxes and wanes -CT of the head showed mild atrophic changes without acute intracranial abnormality -MRI of the brain: Symmetric diffusion weighted and T2/FLAIR hyperintense signal abnormality within the medial thalami. Findings favor sequelae of Wernicke's encephalopathy There was question of patient having alcohol intoxication, however alcohol level less than 10. Urine drug screen negative -Patient was started on high-dose thiamine. -Discussed with neurology on 12/16/2018, Dr. Laurence Slate, recommended thiamine 500 mg 3 times daily for 3 days then followed by 100 mg daily. -Vitamin B1 level elevated 2 weeks ago (330) however she has been on thiamine -Discussed with Dr. Otelia Limes on 12/19/2018, recommended continuing  thiamine IV 250 mg twice daily for 3 days and then transition to 100 mg oral daily. She remains on thiamine 100 mg p.o. daily currently.Likely has memory impairment due to [facility]. -Outpatient follow-up with neurology at discharge. -Delirium precautions -Oriented bedside -Needs placement  Essential hypertension BP stable -continue amlodipine   History of alcohol abuse Off CIWA protocol -Continue folate, thiamine and multivitamin.      History of tobacco abuse -Continue nicotine patch  Generalized deconditioning Bedbug -Bedbug found on patient on presentation. -PT/OT recommending SNF.   PT Feel that patient does need supervision as she appears to have short-term memory impairment.  -Child psychotherapist following.  Vertigo.  Patient with reported hx of vertigo, symptomatic today. Started on meclizine prn.   DVT prophylaxis: Lovenox Diet: Heart healthy Family Communication: Patient only Disposition Plan: DC pending bed availability at SNF, difficult placement.  Medically stable for discharge  Consultants  Neurology via phone  Procedures  None  Antibiotics   Anti-infectives (From admission, onward)   Start     Dose/Rate Route Frequency Ordered Stop   12/14/18 0100  cefTRIAXone (ROCEPHIN) 1 g in sodium chloride 0.9 % 100 mL IVPB  Status:  Discontinued     1 g 200 mL/hr over 30 Minutes Intravenous Every 24 hours 12/14/18 0010 12/17/18 0948           Subjective   Denies headache, constipation, diarrhea. Her mood is reported as good. Her appetite is improving.    Objective   Vitals:   01/06/19 0845 01/06/19 1459 01/06/19 2152 01/07/19 0758  BP: 127/76 (!) 141/73 115/83 108/83  Pulse:   92 88  Resp:   18   Temp:  97.9 F (36.6 C) 98.7 F (37.1 C)  98.4 F (36.9 C)  TempSrc:  Oral Oral Oral  SpO2:  98% 93% 95%  Weight:      Height:       No intake or output data in the 24 hours ending 01/07/19 1459 Filed Weights   12/15/18 0101  Weight: 98.4 kg     Examination:  Physical Exam Vitals signs and nursing note reviewed.  HENT:     Head: Normocephalic and atraumatic.     Nose: Nose normal.     Mouth/Throat:     Mouth: Mucous membranes are moist.  Eyes:     Extraocular Movements: Extraocular movements intact.  Neck:     Musculoskeletal: Normal range of motion. No neck rigidity.  Cardiovascular:     Rate and Rhythm: Normal rate and regular rhythm.  Pulmonary:     Effort: Pulmonary effort is normal.     Breath sounds: Normal breath sounds.  Abdominal:     General: Abdomen is flat.     Palpations: Abdomen is soft.  Musculoskeletal: Normal range of motion.  Skin:    General: Skin is warm.     Comments: Left lower extremity maculopapular rash, white with some scales, thickened skin  improved  Neurological:     Mental Status: She is alert.     Comments: Alerted to place,  person, and situation but not to time, continues to have short-term memory impairment   Psychiatric:        Behavior: Behavior normal.        Thought Content: Thought content normal.        Judgment: Judgment normal.     Data Reviewed: I have personally reviewed following labs and imaging studies  CBC: Recent Labs  Lab 01/04/19 0222 01/06/19 0151 01/07/19 0013  WBC 6.9 6.4 6.6  HGB 11.7* 11.7* 12.0  HCT 35.2* 34.7* 35.4*  MCV 87.8 85.9 86.6  PLT 283 270 275   Basic Metabolic Panel: Recent Labs  Lab 01/03/19 0500 01/04/19 0222 01/06/19 0151 01/07/19 0013  NA  --  140 139 139  K  --  3.7 3.5 3.5  CL  --  106 106 105  CO2  --  26 23 22   GLUCOSE  --  88 87 93  BUN  --  12 9 11   CREATININE 0.65 0.64 0.55 0.70  CALCIUM  --  9.3 9.4 9.3  MG  --  1.9 2.0  --    GFR: Estimated Creatinine Clearance: 89 mL/min (by C-G formula based on SCr of 0.7 mg/dL). Liver Function Tests: No results for input(s): AST, ALT, ALKPHOS, BILITOT, PROT, ALBUMIN in the last 168 hours. No results for input(s): LIPASE, AMYLASE in the last 168 hours. No results for  input(s): AMMONIA in the last 168 hours. Coagulation Profile: No results for input(s): INR, PROTIME in the last 168 hours. Cardiac Enzymes: No results for input(s): CKTOTAL, CKMB, CKMBINDEX, TROPONINI in the last 168 hours. BNP (last 3 results) No results for input(s): PROBNP in the last 8760 hours. HbA1C: No results for input(s): HGBA1C in the last 72 hours. CBG: No results for input(s): GLUCAP in the last 168 hours. Lipid Profile: No results for input(s): CHOL, HDL, LDLCALC, TRIG, CHOLHDL, LDLDIRECT in the last 72 hours. Thyroid Function Tests: No results for input(s): TSH, T4TOTAL, FREET4, T3FREE, THYROIDAB in the last 72 hours. Anemia Panel: No results for input(s): VITAMINB12, FOLATE, FERRITIN, TIBC, IRON, RETICCTPCT in the last 72 hours. Sepsis Labs: No results for input(s): PROCALCITON, LATICACIDVEN in the last 168  hours.  No results found for this or any previous visit (from the past 240 hour(s)).       Radiology Studies: No results found.      Scheduled Meds: . amLODipine  5 mg Oral Daily  . enoxaparin (LOVENOX) injection  40 mg Subcutaneous Q24H  . folic acid  1 mg Oral Daily  . ketoconazole   Topical BID  . multivitamin with minerals  1 tablet Oral Daily  . nicotine  21 mg Transdermal Daily  . thiamine  100 mg Oral Daily   Continuous Infusions:   LOS: 25 days    Time spent: 25 minutes with over 50% of the time coordinating the patient's care    Blain Pais, MD [facility]   If 7PM-7AM, please contact night-coverage [email or web address] Password [email or web address] 01/07/2019, 2:59 PM

## 2019-01-07 NOTE — Progress Notes (Signed)
Pt encouraged to eat. Assisted with cutting and setup.

## 2019-01-07 NOTE — Progress Notes (Signed)
MD paged:Pt request med prn for heartburn/reflux.

## 2019-01-08 MED ORDER — ENSURE ENLIVE PO LIQD
237.0000 mL | Freq: Two times a day (BID) | ORAL | Status: DC
  Administered 2019-01-08 – 2019-05-18 (×173): 237 mL via ORAL
  Filled 2019-01-08: qty 237

## 2019-01-08 MED ORDER — SODIUM CHLORIDE 0.9 % IV BOLUS
500.0000 mL | Freq: Once | INTRAVENOUS | Status: AC
  Administered 2019-01-08: 500 mL via INTRAVENOUS

## 2019-01-08 MED ORDER — TRAZODONE HCL 50 MG PO TABS
50.0000 mg | ORAL_TABLET | Freq: Once | ORAL | Status: AC
  Administered 2019-01-08: 50 mg via ORAL
  Filled 2019-01-08: qty 1

## 2019-01-08 NOTE — Progress Notes (Signed)
Md notified of pt decreased output. Bladder scan showed less than 29ml twice. Per MD encourage fluids.

## 2019-01-08 NOTE — Progress Notes (Signed)
500cc bolus complete.  Pt continues to deny urge to void.  Pads dry.  Will notify oncoming nurse due to change of shift.   Pt in no apparent distress.  Responds appropriately. Resting quietly.  Continues to deny needs.

## 2019-01-08 NOTE — Progress Notes (Signed)
Pt with no urine output this shift.  No oral intake as well.  Offered toileting and water/snacks multiple times, pt declines.  Scanned bladder, max reading was 187ml.  Encouraged PO intake with water.  Was able to get pt to drink 1 cup.    Will continue to monitor.

## 2019-01-08 NOTE — Progress Notes (Signed)
Physical Therapy Treatment Patient Details Name: Marie Woods MRN: [id number] DOB: 1955-06-19 Today's Date: 01/08/2019    History of Present Illness Patient is a 63 year old female with medical history significant of asthma, hypertension, alcohol abuse who was brought in by EMS after neighbors called in stating that patient is confused and altered. Patient admitted Severe Sepsis with Acute metabolic encephalopathy secondary to UTI.     PT Comments    Pt received in recliner. Agreeable to participation in therapy. Pt with c/o headache. She required min assist sit to stand and min assist ambulation 200' with RW. Assist/cueing needed to stay close to RW and to maneuver around obstacles. She presents with decreased safety awareness and decreased problem solving.  Pt returned to recliner at end of session.    Follow Up Recommendations  SNF;Supervision/Assistance - 24 hour     Equipment Recommendations  Rolling walker with 5" wheels    Recommendations for Other Services       Precautions / Restrictions Precautions Precautions: Fall    Mobility  Bed Mobility               General bed mobility comments: Pt received in recliner and returned to recliner.  Transfers Overall transfer level: Needs assistance Equipment used: Rolling walker (2 wheeled) Transfers: Sit to/from Stand Sit to Stand: Min assist         General transfer comment: cues for hand placement. Multi attempts to acheive full upright stance.  Ambulation/Gait Ambulation/Gait assistance: Min assist Gait Distance (Feet): 200 Feet Assistive device: Rolling walker (2 wheeled) Gait Pattern/deviations: Step-through pattern;Trunk flexed;Drifts right/left;Wide base of support Gait velocity: WFL Gait velocity interpretation: 1.31 - 2.62 ft/sec, indicative of limited community ambulator General Gait Details: decreased foot clearance RLE. Cues to stay close to RW.   Stairs             Wheelchair Mobility     Modified Rankin (Stroke Patients Only)       Balance Overall balance assessment: Needs assistance Sitting-balance support: Feet supported;No upper extremity supported Sitting balance-Leahy Scale: Good     Standing balance support: Bilateral upper extremity supported;During functional activity Standing balance-Leahy Scale: Fair Standing balance comment: BUE support on RW for amb                            Cognition Arousal/Alertness: Awake/alert Behavior During Therapy: Impulsive Overall Cognitive Status: No family/caregiver present to determine baseline cognitive functioning Area of Impairment: Following commands;Safety/judgement;Orientation;Attention;Memory                 Orientation Level: Disoriented to;Situation;Time;Place Current Attention Level: Sustained Memory: Decreased short-term memory Following Commands: Follows one step commands inconsistently Safety/Judgement: Decreased awareness of safety;Decreased awareness of deficits Awareness: Emergent Problem Solving: Difficulty sequencing;Requires verbal cues;Requires tactile cues General Comments: easily distracted.      Exercises      General Comments        Pertinent Vitals/Pain Pain Assessment: Faces Faces Pain Scale: Hurts little more Pain Location: headache Pain Descriptors / Indicators: Headache Pain Intervention(s): Monitored during session    Home Living                      Prior Function            PT Goals (current goals can now be found in the care plan section) Acute Rehab PT Goals Patient Stated Goal: not stated Time For Goal Achievement: 01/12/19 Progress  towards PT goals: Progressing toward goals    Frequency    Min 3X/week      PT Plan Current plan remains appropriate    Co-evaluation              AM-PAC PT "6 Clicks" Mobility   Outcome Measure  Help needed turning from your back to your side while in a flat bed without using bedrails?:  None Help needed moving from lying on your back to sitting on the side of a flat bed without using bedrails?: None Help needed moving to and from a bed to a chair (including a wheelchair)?: A Little Help needed standing up from a chair using your arms (e.g., wheelchair or bedside chair)?: A Little Help needed to walk in hospital room?: A Little Help needed climbing 3-5 steps with a railing? : A Lot 6 Click Score: 19    End of Session Equipment Utilized During Treatment: Gait belt Activity Tolerance: Patient tolerated treatment well Patient left: in chair;with call bell/phone within reach Nurse Communication: Mobility status PT Visit Diagnosis: Other abnormalities of gait and mobility (R26.89);Muscle weakness (generalized) (M62.81);Difficulty in walking, not elsewhere classified (R26.2)     Time: 2536-6440 PT Time Calculation (min) (ACUTE ONLY): 24 min  Charges:  $Gait Training: 23-37 mins                     Aida Raider, [place]  Office # [phone] Pager [phone]    Ilda Foil 01/08/2019, 12:14 PM

## 2019-01-08 NOTE — Progress Notes (Signed)
PROGRESS NOTE    Marie Woods    Code Status: Full Code  WJX:[id number] DOB: [birth year] DOA: 12/13/2018  PCP: Marcine MatarJohnson, Deborah B, MD    Hospital Summary  63 year old female with history of asthma, hypertension, alcohol abuse was brought to the hospital confused and altered.  She was found to have UTI; urine culture showed multiple species.  MRI brain was suggestive of possible Wernicke's encephalopathy.  Treated with thiamine.  PT continues to recommend SNF placement.  A & P   Principal Problem:   AMS (altered mental status) Active Problems:   History of alcohol use   TOBACCO ABUSE   Essential hypertension  Frontal headache without red flags CT head 11/10 unremarkable Her headache has resolved.  BP stable.    Left lower extremity rash, concern for fungal Some improvement with ketoconazole cream -Switched clobetasol for ketoconazole cream 12/1 -Continue Eucerin cream.   Severe sepsis secondary to polymicrobial UTI:  Present on admission, currently resolved with Rocephin   Acute metabolic encephalopathy likely secondary to sepsis and Possible Wernicke's encephalopathy, likely underlying dementia  Seems at baseline mental status but has short-term impairment requiring assistance with ADLs including constant reminder to eat her meals (needed nursing to remind her to eat x3 for breakfast on 12/6)  Per nursing, short-term memory waxes and wanes -CT of the head showed mild atrophic changes without acute intracranial abnormality -MRI of the brain: Symmetric diffusion weighted and T2/FLAIR hyperintense signal abnormality within the medial thalami. Findings favor sequelae of Wernicke's encephalopathy There was question of patient having alcohol intoxication, however alcohol level less than 10. Urine drug screen negative -Patient was started on high-dose thiamine. -Discussed with neurology on 12/16/2018, Dr. Laurence SlateAroor, recommended thiamine 500 mg 3 times daily for 3 days then followed  by 100 mg daily. -Vitamin B1 level elevated 2 weeks ago (330) however she has been on thiamine -Discussed with Dr. Otelia LimesLindzen on 12/19/2018, recommended continuing thiamine IV 250 mg twice daily for 3 days and then transition to 100 mg oral daily. She remains on thiamine 100 mg p.o. daily currently.Likely has memory impairment [facility] to [facility]. -Outpatient follow-up with neurology at discharge. -Delirium precautions -Oriented bedside -Needs placement -Had long discussion with the patient in regards to her diagnosis and prognosis but unclear if she will remember all this. May need daily reminders of why she is still in the hospital.   Essential hypertension BP stable -continue amlodipine   History of alcohol abuse Off CIWA protocol -Continue folate, thiamine and multivitamin.      History of tobacco abuse -Continue nicotine patch  Generalized deconditioning Bedbug -Bedbug found on patient on presentation. -PT/OT recommending SNF.   PT Feel that patient does need supervision as she appears to have short-term memory impairment.  -Child psychotherapistocial worker following.  Vertigo.  Patient with reported hx of vertigo, symptomatic today. Started on meclizine prn.   DVT prophylaxis: Lovenox Diet: Heart healthy Family Communication: Patient only Disposition Plan: DC pending bed availability at SNF, difficult placement.  Medically stable for discharge  Consultants  Neurology via phone  Procedures  None  Antibiotics   Anti-infectives (From admission, onward)   Start     Dose/Rate Route Frequency Ordered Stop   12/14/18 0100  cefTRIAXone (ROCEPHIN) 1 g in sodium chloride 0.9 % 100 mL IVPB  Status:  Discontinued     1 g 200 mL/hr over 30 Minutes Intravenous Every 24 hours 12/14/18 0010 12/17/18 0948           Subjective  Denies headache, constipation, diarrhea. Her mood is reported as good. Her appetite is improving.    Objective   Vitals:   01/07/19 0758 01/07/19 1636 01/07/19  2253 01/08/19 0751  BP: 108/83 132/88 (!) 120/51 109/75  Pulse: 88 91 80 79  Resp:  16 20   Temp: 98.4 F (36.9 C) 98.5 F (36.9 C) 98.2 F (36.8 C) 98.6 F (37 C)  TempSrc: Oral Oral Oral Oral  SpO2: 95% 98% 99% 96%  Weight:      Height:        Intake/Output Summary (Last 24 hours) at 01/08/2019 1226 Last data filed at 01/08/2019 0542 Gross per 24 hour  Intake 740 ml  Output -  Net 740 ml   Filed Weights   12/15/18 0101  Weight: 98.4 kg    Examination:  Physical Exam Vitals signs and nursing note reviewed.  HENT:     Head: Normocephalic and atraumatic.     Nose: Nose normal.     Mouth/Throat:     Mouth: Mucous membranes are moist.  Eyes:     Extraocular Movements: Extraocular movements intact.  Neck:     Musculoskeletal: Normal range of motion. No neck rigidity.  Cardiovascular:     Rate and Rhythm: Normal rate and regular rhythm.  Pulmonary:     Effort: Pulmonary effort is normal.     Breath sounds: Normal breath sounds.  Abdominal:     General: Abdomen is flat.     Palpations: Abdomen is soft.  Musculoskeletal: Normal range of motion.  Skin:    General: Skin is warm.     Comments: Left lower extremity maculopapular rash, white with some scales, thickened skin  improved  Neurological:     Mental Status: She is alert.     Comments: Alerted to place,  person, and situation but not to time, continues to have short-term memory impairment   Psychiatric:        Behavior: Behavior normal.        Thought Content: Thought content normal.        Judgment: Judgment normal.     Data Reviewed: I have personally reviewed following labs and imaging studies  CBC: Recent Labs  Lab 01/04/19 0222 01/06/19 0151 01/07/19 0013  WBC 6.9 6.4 6.6  HGB 11.7* 11.7* 12.0  HCT 35.2* 34.7* 35.4*  MCV 87.8 85.9 86.6  PLT 283 270 275   Basic Metabolic Panel: Recent Labs  Lab 01/03/19 0500 01/04/19 0222 01/06/19 0151 01/07/19 0013  NA  --  140 139 139  K  --  3.7  3.5 3.5  CL  --  106 106 105  CO2  --  26 23 22   GLUCOSE  --  88 87 93  BUN  --  12 9 11   CREATININE 0.65 0.64 0.55 0.70  CALCIUM  --  9.3 9.4 9.3  MG  --  1.9 2.0  --    GFR: Estimated Creatinine Clearance: 89 mL/min (by C-G formula based on SCr of 0.7 mg/dL). Liver Function Tests: No results for input(s): AST, ALT, ALKPHOS, BILITOT, PROT, ALBUMIN in the last 168 hours. No results for input(s): LIPASE, AMYLASE in the last 168 hours. No results for input(s): AMMONIA in the last 168 hours. Coagulation Profile: No results for input(s): INR, PROTIME in the last 168 hours. Cardiac Enzymes: No results for input(s): CKTOTAL, CKMB, CKMBINDEX, TROPONINI in the last 168 hours. BNP (last 3 results) No results for input(s): PROBNP in the last 8760 hours. HbA1C:  No results for input(s): HGBA1C in the last 72 hours. CBG: No results for input(s): GLUCAP in the last 168 hours. Lipid Profile: No results for input(s): CHOL, HDL, LDLCALC, TRIG, CHOLHDL, LDLDIRECT in the last 72 hours. Thyroid Function Tests: No results for input(s): TSH, T4TOTAL, FREET4, T3FREE, THYROIDAB in the last 72 hours. Anemia Panel: No results for input(s): VITAMINB12, FOLATE, FERRITIN, TIBC, IRON, RETICCTPCT in the last 72 hours. Sepsis Labs: No results for input(s): PROCALCITON, LATICACIDVEN in the last 168 hours.  No results found for this or any previous visit (from the past 240 hour(s)).       Radiology Studies: No results found.      Scheduled Meds: . amLODipine  5 mg Oral Daily  . enoxaparin (LOVENOX) injection  40 mg Subcutaneous Q24H  . folic acid  1 mg Oral Daily  . ketoconazole   Topical BID  . multivitamin with minerals  1 tablet Oral Daily  . nicotine  21 mg Transdermal Daily  . thiamine  100 mg Oral Daily   Continuous Infusions:   LOS: 26 days    Time spent: 25 minutes with over 50% of the time coordinating the patient's care    Blain Pais, MD [facility]   If  7PM-7AM, please contact night-coverage [email or web address] Password [email or web address] 01/08/2019, 12:26 PM

## 2019-01-08 NOTE — Progress Notes (Signed)
Within a period of approximately 30 min pt has called out about 5-6 times. Pt request pain med for h/a-given Pt request cigarette-not given Pt request cigarette-1/2 cup water given Pt request beer-1/2 cup water with ice given Pt request to call job-1 container of apple juice given, phone at bedside, pt reminded they have been in hospital for several days. Bed alarms on for pt safety.

## 2019-01-08 NOTE — TOC Progression Note (Signed)
Transition of Care [facility]) - Progression Note    Patient Details  Name: Marie Woods MRN: [id number] Date of Birth: [birth year]  Transition [facility]) CM/SW [place], Lillian Phone Number: 01/08/2019, 11:52 AM  Clinical Narrative:     Patient currently does not have any bed offers. CSW will continue to follow and assist TOC needs.   Expected Discharge Plan: [place] Barriers to Discharge: Continued Medical Work up, SNF Pending payor source - LOG  Expected Discharge Plan and Services Expected Discharge Plan: [place] In-house Referral: Clinical Social Work Discharge Planning Services: NA Post Acute Care Choice: [place] Living arrangements for the past 2 months: Apartment                 DME Arranged: N/A DME Agency: NA                   Social Determinants of Health (SDOH) Interventions    Readmission Risk Interventions No flowsheet data found.

## 2019-01-08 NOTE — Plan of Care (Signed)
  Problem: Education: Goal: Knowledge of General Education information will improve Description: Including pain rating scale, medication(s)/side effects and non-pharmacologic comfort measures Outcome: Not Progressing   Problem: Health Behavior/Discharge Planning: Goal: Ability to manage health-related needs will improve Outcome: Not Progressing   Problem: Clinical Measurements: Goal: Respiratory complications will improve Outcome: Progressing   Problem: Activity: Goal: Risk for activity intolerance will decrease Outcome: Progressing   Problem: Safety: Goal: Ability to remain free from injury will improve Outcome: Progressing

## 2019-01-09 NOTE — TOC Progression Note (Signed)
Transition of Care [facility]) - Progression Note    Patient Details  Name: Marie Woods MRN: [id number] Date of Birth: May 03, 1955  Transition of Care [facility]) CM/SW Contact  Sharin Mons, RN Phone Number: [phone] 01/09/2019, 10:55 AM  Clinical Narrative:    Patient remains on  SNF difficult to place list with no bed offers. TOC team will continue to monitor.  Expected Discharge Plan: Skilled Nursing Facility Barriers to Discharge: No SNF bed, SNF Pending payor source - LOG  Expected Discharge Plan and Services Expected Discharge Plan: [place] In-house Referral: Clinical Social Work Discharge Planning Services: NA Post Acute Care Choice: [place] Living arrangements for the past 2 months: Apartment                 DME Arranged: N/A DME Agency: NA                   Social Determinants of Health (SDOH) Interventions    Readmission Risk Interventions No flowsheet data found.

## 2019-01-09 NOTE — Progress Notes (Signed)
Occupational Therapy Treatment Patient Details Name: Marie Woods MRN: [id number] DOB: [birth year] Today's Date: 01/09/2019    History of present illness Patient is a 63 year old female with medical history significant of asthma, hypertension, alcohol abuse who was brought in by EMS after neighbors called in stating that patient is confused and altered. Patient admitted Severe Sepsis with Acute metabolic encephalopathy secondary to UTI.    OT comments  Upon entering the room, pt seated on EOB. Pt is oriented to self only. She does not know where she is, why she is in hospital, or date. She thinks she has been in the hospital for 1 week and seems shocked when I tell her she has been here for 4 weeks. Pt declines self care and NT arrived and reports recently assisting pt with self care in which she only needed set up - min guard. Pt standing with min guard and ambulating into hallway with use of RW and no LOB. Pt needing cuing for forward gaze. Pt ambulating 50' and returning back to room with cuing to locate. Pt standing at sink with min guard for grooming tasks before returning to sit on EOB. Bed alarm activated and call bell within reach. Pt continues to benefit from OT intervention and SNF recommendation remains appropriate as pt does not appear to have 24/7 at home.  Follow Up Recommendations  SNF;Supervision/Assistance - 24 hour    Equipment Recommendations  Other (comment)(defer to next venue of care)       Precautions / Restrictions Precautions Precautions: Fall Precaution Comments: was on contact for bed bugs, not on on 11/16       Mobility Bed Mobility   General bed mobility comments: sitting on EOB when entering the room  Transfers Overall transfer level: Needs assistance Equipment used: Rolling walker (2 wheeled) Transfers: Sit to/from Stand Sit to Stand: Min guard Stand pivot transfers: Min guard       General transfer comment: min guard overall with min cuing for  hand placement and technique    Balance Overall balance assessment: Needs assistance Sitting-balance support: Feet supported;No upper extremity supported Sitting balance-Leahy Scale: Good Sitting balance - Comments: static sitting balance EOB without LOB   Standing balance support: Bilateral upper extremity supported;During functional activity Standing balance-Leahy Scale: Fair Standing balance comment: BUE support on RW for amb           ADL either performed or assessed with clinical judgement   ADL       Grooming: Brushing hair;Oral care;Min guard;Standing                          Cognition Arousal/Alertness: Awake/alert Behavior During Therapy: Impulsive Overall Cognitive Status: No family/caregiver present to determine baseline cognitive functioning Area of Impairment: Following commands;Safety/judgement;Orientation;Attention;Memory        Orientation Level: Disoriented to;Situation;Time;Place Current Attention Level: Sustained Memory: Decreased short-term memory Following Commands: Follows one step commands inconsistently Safety/Judgement: Decreased awareness of safety;Decreased awareness of deficits Awareness: Emergent Problem Solving: Difficulty sequencing;Requires verbal cues;Requires tactile cues General Comments: externally distracted                   Pertinent Vitals/ Pain       Pain Assessment: No/denies pain         Frequency  Min 2X/week        Progress Toward Goals  OT Goals(current goals can now be found in the care plan section)  Progress towards OT goals:  Progressing toward goals  Acute Rehab OT Goals Patient Stated Goal: to go home OT Goal Formulation: With patient Time For Goal Achievement: 01/23/19 Potential to Achieve Goals: Good  Plan Discharge plan remains appropriate       AM-PAC OT "6 Clicks" Daily Activity     Outcome Measure     Help from another person taking care of personal grooming?: None Help from  another person toileting, which includes using toliet, bedpan, or urinal?: A Little Help from another person bathing (including washing, rinsing, drying)?: A Little Help from another person to put on and taking off regular upper body clothing?: A Little Help from another person to put on and taking off regular lower body clothing?: A Little 6 Click Score: 16    End of Session Equipment Utilized During Treatment: Rolling walker  OT Visit Diagnosis: Unsteadiness on feet (R26.81);Other abnormalities of gait and mobility (R26.89);Muscle weakness (generalized) (M62.81);Other symptoms and signs involving cognitive function   Activity Tolerance Patient tolerated treatment well   Patient Left in bed;with call bell/phone within reach;with bed alarm set   Nurse Communication Mobility status        Time: 4132-4401 OT Time Calculation (min): 23 min  Charges: OT General Charges $OT Visit: 1 Visit OT Treatments $Self Care/Home Management : 8-22 mins $Therapeutic Activity: 8-22 mins   Gypsy Decant MS, OTR/L 01/09/2019, 12:08 PM

## 2019-01-09 NOTE — Plan of Care (Signed)
  Problem: Education: Goal: Knowledge of General Education information will improve Description: Including pain rating scale, medication(s)/side effects and non-pharmacologic comfort measures 01/09/2019 2247 by Max Fickle, RN Outcome: Progressing 01/09/2019 2247 by Max Fickle, RN Outcome: Progressing   Problem: Health Behavior/Discharge Planning: Goal: Ability to manage health-related needs will improve 01/09/2019 2247 by Max Fickle, RN Outcome: Progressing 01/09/2019 2247 by Max Fickle, RN Outcome: Progressing   Problem: Clinical Measurements: Goal: Ability to maintain clinical measurements within normal limits will improve 01/09/2019 2247 by Max Fickle, RN Outcome: Progressing 01/09/2019 2247 by Max Fickle, RN Outcome: Progressing Goal: Will remain free from infection 01/09/2019 2247 by Max Fickle, RN Outcome: Progressing 01/09/2019 2247 by Max Fickle, RN Outcome: Progressing Goal: Diagnostic test results will improve 01/09/2019 2247 by Max Fickle, RN Outcome: Progressing 01/09/2019 2247 by Max Fickle, RN Outcome: Progressing Goal: Respiratory complications will improve 01/09/2019 2247 by Max Fickle, RN Outcome: Progressing 01/09/2019 2247 by Max Fickle, RN Outcome: Progressing Goal: Cardiovascular complication will be avoided 01/09/2019 2247 by Max Fickle, RN Outcome: Progressing 01/09/2019 2247 by Max Fickle, RN Outcome: Progressing   Problem: Activity: Goal: Risk for activity intolerance will decrease 01/09/2019 2247 by Max Fickle, RN Outcome: Progressing 01/09/2019 2247 by Max Fickle, RN Outcome: Progressing   Problem: Nutrition: Goal: Adequate nutrition will be maintained Outcome: Progressing   Problem: Coping: Goal: Level of anxiety will decrease Outcome: Progressing   Problem: Elimination: Goal: Will not experience complications related to bowel motility Outcome: Progressing Goal: Will  not experience complications related to urinary retention Outcome: Progressing   Problem: Pain Managment: Goal: General experience of comfort will improve Outcome: Progressing   Problem: Safety: Goal: Ability to remain free from injury will improve Outcome: Progressing   Problem: Skin Integrity: Goal: Risk for impaired skin integrity will decrease Outcome: Progressing

## 2019-01-09 NOTE — Progress Notes (Signed)
PROGRESS NOTE    Marie Woods  UKG:[id number] DOB: Oct 10, 1955 DOA: 12/13/2018 PCP: Ladell Pier, MD   Brief Narrative:  63 year old with history of asthma, alcohol abuse, essential hypertension brought to the hospital with complains of confusion and change in mental status.  Patient was found to have urinary tract infection with urine cultures growing multiple species which was treated with Rocephin.  MRI was suggestive of possible Wernicke's encephalopathy treated with IV thiamine and then transition to p.o. thiamine.  Physical therapy recommended skilled nursing facility placement.   Assessment & Plan:   Principal Problem:   AMS (altered mental status) Active Problems:   History of alcohol use   TOBACCO ABUSE   Essential hypertension  Sepsis secondary to polymicrobial urinary tract infection -Resolved, treatment completed with Rocephin  Acute metabolic encephalopathy likely from [place] encephalopathy Alcohol use -CT the head negative for any acute finding.  MRI brain was suggestive of possible Wernicke's encephalopathy -Treated with high doses of thiamine now on p.o. thiamine -Outpatient follow-up with neurology. -Not showing any signs of alcohol withdrawal. -Short-term memory issues. -Continue folate and multivitamin  History of tobacco use -Nicotine patch  Generalized deconditioning -PT/OT recommending skilled nursing facility, working on placement.  Intermittent frontal headache -No focal neurologic deficit.  CT of the head and MRI brain has been negative.  Intermittent vertiginous symptoms treated with meclizine  Essential hypertension -Norvasc 5 mg daily.  Left lower extremity rash, concerns for fungal infection -On ketoconazole.  Eucerin cream.  PT/OT evaluation performed.  SNF recommended.  SNF appropriate as the patient has received 3 days of hospital care (or the 3 days stay Has been made by the patient's insurance company) and is felt to need rehab  services to restore this patient to their prior level of function to achieve safe transition back to home care.  This patient needs rehab services for at least 5 days/week and skilled nursing services daily to facilitate this transition.  Rehab is been requested as the most appropriate discharge option for this patient and is not felt to be custodial care as evidenced by prior independent level of functioning now requiring 24/7 supervision..     DVT prophylaxis: Lovenox Code Status: Full code Family Communication: None at bedside Disposition Plan: Difficult placement, working on placement at skilled nursing facility.  Medically stable at this time.  Consultants:   Curbside neurology by previous provider   Antimicrobials:   Completed treatment with Rocephin   Subjective: Feels okay, does not have any complaints at this time.  Sitting up at the side of the bed.  Review of Systems Otherwise negative except as per HPI, including: General: Denies fever, chills, night sweats or unintended weight loss. Resp: Denies cough, wheezing, shortness of breath. Cardiac: Denies chest pain, palpitations, orthopnea, paroxysmal nocturnal dyspnea. GI: Denies abdominal pain, nausea, vomiting, diarrhea or constipation GU: Denies dysuria, frequency, hesitancy or incontinence MS: Denies muscle aches, joint pain or swelling Neuro: Denies headache, neurologic deficits (focal weakness, numbness, tingling), abnormal gait Psych: Denies anxiety, depression, SI/HI/AVH Skin: Denies new rashes or lesions ID: Denies sick contacts, exotic exposures, travel  Objective: Vitals:   01/07/19 2253 01/08/19 0751 01/08/19 1541 01/08/19 2200  BP: (!) 120/51 109/75 124/79 103/67  Pulse: 80 79 99 97  Resp: 20  16 16   Temp: 98.2 F (36.8 C) 98.6 F (37 C) 97.7 F (36.5 C) 98.3 F (36.8 C)  TempSrc: Oral Oral Oral Oral  SpO2: 99% 96% 97% 97%  Weight:  Height:        Intake/Output Summary (Last 24 hours) at  01/09/2019 0740 Last data filed at 01/09/2019 0000 Gross per 24 hour  Intake 100 ml  Output 100 ml  Net 0 ml   Filed Weights   12/15/18 0101  Weight: 98.4 kg    Examination:  General exam: Appears calm and comfortable  Respiratory system: Clear to auscultation. Respiratory effort normal. Cardiovascular system: S1 & S2 heard, RRR. No JVD, murmurs, rubs, gallops or clicks. No pedal edema. Gastrointestinal system: Abdomen is nondistended, soft and nontender. No organomegaly or masses felt. Normal bowel sounds heard. Central nervous system: Alert and oriented. No focal neurological deficits. Extremities: Symmetric 4 x 5 power. Skin: No rashes, lesions or ulcers Psychiatry: Judgement and insight appear normal. Mood & affect appropriate.     Data Reviewed:   CBC: Recent Labs  Lab 01/04/19 0222 01/06/19 0151 01/07/19 0013  WBC 6.9 6.4 6.6  HGB 11.7* 11.7* 12.0  HCT 35.2* 34.7* 35.4*  MCV 87.8 85.9 86.6  PLT 283 270 275   Basic Metabolic Panel: Recent Labs  Lab 01/03/19 0500 01/04/19 0222 01/06/19 0151 01/07/19 0013  NA  --  140 139 139  K  --  3.7 3.5 3.5  CL  --  106 106 105  CO2  --  26 23 22   GLUCOSE  --  88 87 93  BUN  --  12 9 11   CREATININE 0.65 0.64 0.55 0.70  CALCIUM  --  9.3 9.4 9.3  MG  --  1.9 2.0  --    GFR: Estimated Creatinine Clearance: 89 mL/min (by C-G formula based on SCr of 0.7 mg/dL). Liver Function Tests: No results for input(s): AST, ALT, ALKPHOS, BILITOT, PROT, ALBUMIN in the last 168 hours. No results for input(s): LIPASE, AMYLASE in the last 168 hours. No results for input(s): AMMONIA in the last 168 hours. Coagulation Profile: No results for input(s): INR, PROTIME in the last 168 hours. Cardiac Enzymes: No results for input(s): CKTOTAL, CKMB, CKMBINDEX, TROPONINI in the last 168 hours. BNP (last 3 results) No results for input(s): PROBNP in the last 8760 hours. HbA1C: No results for input(s): HGBA1C in the last 72 hours. CBG: No  results for input(s): GLUCAP in the last 168 hours. Lipid Profile: No results for input(s): CHOL, HDL, LDLCALC, TRIG, CHOLHDL, LDLDIRECT in the last 72 hours. Thyroid Function Tests: No results for input(s): TSH, T4TOTAL, FREET4, T3FREE, THYROIDAB in the last 72 hours. Anemia Panel: No results for input(s): VITAMINB12, FOLATE, FERRITIN, TIBC, IRON, RETICCTPCT in the last 72 hours. Sepsis Labs: No results for input(s): PROCALCITON, LATICACIDVEN in the last 168 hours.  No results found for this or any previous visit (from the past 240 hour(s)).       Radiology Studies: No results found.      Scheduled Meds: . amLODipine  5 mg Oral Daily  . enoxaparin (LOVENOX) injection  40 mg Subcutaneous Q24H  . feeding supplement (ENSURE ENLIVE)  237 mL Oral BID BM  . folic acid  1 mg Oral Daily  . ketoconazole   Topical BID  . multivitamin with minerals  1 tablet Oral Daily  . nicotine  21 mg Transdermal Daily  . thiamine  100 mg Oral Daily   Continuous Infusions:   LOS: 27 days   Time spent= 20 mins    Jesten Cappuccio , MD [facility]  If 7PM-7AM, please contact night-coverage  01/09/2019, 7:40 AM

## 2019-01-10 LAB — CREATININE, SERUM
Creatinine, Ser: 0.61 mg/dL (ref 0.44–1.00)
GFR calc Af Amer: >60 mL/min (ref >60)
GFR calc non Af Amer: >60 mL/min (ref >60)

## 2019-01-10 MED ORDER — DIPHENHYDRAMINE HCL 12.5 MG/5ML PO ELIX
12.5000 mg | ORAL_SOLUTION | Freq: Once | ORAL | Status: AC
  Administered 2019-01-10: 12.5 mg via ORAL
  Filled 2019-01-10: qty 5

## 2019-01-10 MED ORDER — TRAZODONE HCL 50 MG PO TABS
50.0000 mg | ORAL_TABLET | Freq: Every evening | ORAL | Status: DC | PRN
  Administered 2019-01-13 – 2019-05-05 (×61): 50 mg via ORAL
  Filled 2019-01-10 (×63): qty 1

## 2019-01-10 NOTE — Progress Notes (Signed)
Physical Therapy Treatment Patient Details Name: Marie Woods MRN: [id number] DOB: 1955-08-16 Today's Date: 01/10/2019    History of Present Illness Patient is a 63 year old female with medical history significant of asthma, hypertension, alcohol abuse who was brought in by EMS after neighbors called in stating that patient is confused and altered. Patient admitted Severe Sepsis with Acute metabolic encephalopathy secondary to UTI.     PT Comments    Pt OOB in recliner eating upon PT arrival, agreeable to PT session focused on ambulation. The pt was able to demo good stability and safety with transfers and ambulation using RW and min guard (100 ft; gait speed 0.74m/s), so challenge was increased to ambulate without AD but HHA of 1 (250 ft). The pt continues to demo poor clearance bilaterally and absent heel-toe pattern, but demos increased lateral movements without AD. The pt had no LOB and was able to maintain upright through ambulation as well as a few quick turns and some dancing in the hall with only minA. The pt will continue to benefit from skilled PT to address limitations in functional movement and independence.    Follow Up Recommendations  SNF;Supervision/Assistance - 24 hour     Equipment Recommendations  Rolling walker with 5" wheels    Recommendations for Other Services       Precautions / Restrictions Precautions Precautions: Fall Precaution Comments: was on contact for bed bugs, not on on 11/16 Restrictions Weight Bearing Restrictions: No    Mobility  Bed Mobility               General bed mobility comments: OOB in recliner upon arrival  Transfers Overall transfer level: Needs assistance Equipment used: Rolling walker (2 wheeled) Transfers: Sit to/from Stand Sit to Stand: Min guard Stand pivot transfers: Min guard       General transfer comment: min guard overall with min cuing for hand placement and technique  Ambulation/Gait Ambulation/Gait  assistance: Min guard;Min assist(min guard with RW (100 ft), minA HHA of 1 (250 ft)) Gait Distance (Feet): 350 Feet Assistive device: Rolling walker (2 wheeled);1 person hand held assist Gait Pattern/deviations: Step-through pattern;Trunk flexed;Drifts right/left;Wide base of support Gait velocity: 0.57 m/s Gait velocity interpretation: 1.31 - 2.62 ft/sec, indicative of limited community ambulator General Gait Details: decreased foot clearance RLE. Cues to stay close to RW, increased lat sway and assist without RW but no LOB   Stairs             Wheelchair Mobility    Modified Rankin (Stroke Patients Only)       Balance Overall balance assessment: Needs assistance Sitting-balance support: Feet supported;No upper extremity supported Sitting balance-Leahy Scale: Good Sitting balance - Comments: static sitting balance EOB without LOB   Standing balance support: During functional activity;Bilateral upper extremity supported;Single extremity supported Standing balance-Leahy Scale: Fair Standing balance comment: BUE on RW or single UE for HHA during amb                            Cognition Arousal/Alertness: Awake/alert Behavior During Therapy: Impulsive(but redirectable) Overall Cognitive Status: No family/caregiver present to determine baseline cognitive functioning Area of Impairment: Following commands;Safety/judgement;Orientation;Attention;Memory                 Orientation Level: Disoriented to;Situation;Time;Place(pt frequently asking for items she did not bring with her despite being told she does not have them) Current Attention Level: Sustained Memory: Decreased short-term memory Following Commands: Follows  one step commands inconsistently Safety/Judgement: Decreased awareness of safety;Decreased awareness of deficits Awareness: Emergent Problem Solving: Difficulty sequencing;Requires verbal cues;Requires tactile cues General Comments: externally  distracted, but redirectable      Exercises      General Comments        Pertinent Vitals/Pain Pain Assessment: No/denies pain Pain Intervention(s): Monitored during session    Home Living                      Prior Function            PT Goals (current goals can now be found in the care plan section) Acute Rehab PT Goals Patient Stated Goal: to go home PT Goal Formulation: With patient Time For Goal Achievement: 01/12/19 Potential to Achieve Goals: Fair Progress towards PT goals: Progressing toward goals    Frequency    Min 3X/week      PT Plan Current plan remains appropriate    Co-evaluation              AM-PAC PT "6 Clicks" Mobility   Outcome Measure  Help needed turning from your back to your side while in a flat bed without using bedrails?: None Help needed moving from lying on your back to sitting on the side of a flat bed without using bedrails?: None Help needed moving to and from a bed to a chair (including a wheelchair)?: A Little Help needed standing up from a chair using your arms (e.g., wheelchair or bedside chair)?: A Little Help needed to walk in hospital room?: A Little Help needed climbing 3-5 steps with a railing? : A Lot 6 Click Score: 19    End of Session Equipment Utilized During Treatment: Gait belt Activity Tolerance: Patient tolerated treatment well Patient left: in chair;with call bell/phone within reach Nurse Communication: Mobility status PT Visit Diagnosis: Other abnormalities of gait and mobility (R26.89);Muscle weakness (generalized) (M62.81);Difficulty in walking, not elsewhere classified (R26.2)     Time: 1207-1222 PT Time Calculation (min) (ACUTE ONLY): 15 min  Charges:  $Gait Training: 8-22 mins                     Metro Kung, PT, DPT   Acute Rehabilitation Department [phone]   Gaetana Michaelis 01/10/2019, 1:35 PM

## 2019-01-10 NOTE — Progress Notes (Signed)
Nutrition Follow-up  DOCUMENTATION CODES:   Obesity unspecified  INTERVENTION:   - Continue MVI with minerals daily  - Ensure Enlive po BID, each supplement provides 350 kcal and 20 grams of protein  NUTRITION DIAGNOSIS:   Increased nutrient needs related to acute illness as evidenced by estimated needs.  Ongoing.  GOAL:   Patient will meet greater than or equal to 90% of their needs  Progressing.  MONITOR:   PO intake, Supplement acceptance, Labs, Weight trends  ASSESSMENT:   63 year old female who presented to the ED on 11/10 with AMS. PMH of asthma, EtOH abuse, HTN. Pt admitted with severe sepsis secondary to UTI. Pt found to have Wernicke's encephalopathy.  **RD working remotely**  Per MD note, pt continues to be medically stable for discharge. Awaiting placement.  Pt eating and is drinking supplements that are ordered.   Admission weight: 217 lbs. No new weights since 11/12.   Labs reviewed. Medications: Folic acid tablet, Multivitamin with minerals daily, Thiamine tablet  Diet Order:   Diet Order            Diet Heart Room service appropriate? No; Fluid consistency: Thin  Diet effective now              EDUCATION NEEDS:   Not appropriate for education at this time  Skin:  Skin Assessment: Reviewed RN Assessment  Last BM:  12/5  Height:   Ht Readings from Last 1 Encounters:  12/15/18 5\' 7"  (1.702 m)    Weight:   Wt Readings from Last 1 Encounters:  12/15/18 98.4 kg    Ideal Body Weight:  61.4 kg  BMI:  Body mass index is 33.99 kg/m.  Estimated Nutritional Needs:   Kcal:  1900-2100  Protein:  95-110 grams  Fluid:  >/= 1.8 L  Clayton Bibles, MS, RD, LDN Inpatient Clinical Dietitian Pager: [phone] After Hours Pager: [phone]

## 2019-01-10 NOTE — Progress Notes (Signed)
Patient repeatedly asked for Tylenol for a headache, the nurse reassured patient that Tylenol has been administered and she can not receive any more. Patient was taken to the restroom where she urinated. Patient stated she had to go to work at 4 AM and the nurse reassured her that she is in the hospital and will not be going to work. Bed alarm was turned on for patient safety along with night lights in the room. Patient has received a dose of Benadryl to help with sleep. Will continue to monitor.

## 2019-01-10 NOTE — Progress Notes (Signed)
PROGRESS NOTE    Marie Woods  ZOX:[id number] DOB: 05/27/1955 DOA: 12/13/2018 PCP: Marcine MatarJohnson, Deborah B, MD   Brief Narrative:  [age] year old with history of asthma, alcohol abuse, essential hypertension brought to the hospital with complains of confusion and change in mental status.  Patient was found to have urinary tract infection with urine cultures growing multiple species which was treated with Rocephin.  MRI was suggestive of possible Wernicke's encephalopathy treated with IV thiamine and then transition to p.o. thiamine.  Physical therapy recommended skilled nursing facility placement.   Assessment & Plan:   Principal Problem:   AMS (altered mental status) Active Problems:   History of alcohol use   TOBACCO ABUSE   Essential hypertension  Sepsis secondary to polymicrobial urinary tract infection -Sepsis physiology resolved and has been treated with Rocephin.  Acute metabolic encephalopathy likely from Warnicke encephalopathy Alcohol use -CT the head negative for any acute finding.  MRI brain was suggestive of possible Wernicke's encephalopathy -Treated with high doses of thiamine now on p.o. thiamine -Outpatient follow-up with neurology. -Not showing any signs of alcohol withdrawal. -Continues to have short-term memory issues -Continue folate and multivitamin  History of tobacco use -Nicotine patch  Generalized deconditioning -Recommending SNF.  Social worker working on placement  Intermittent frontal headache -No focal neurologic deficit.  CT of the head and MRI brain has been negative.  Intermittent vertiginous symptoms treated with meclizine  Essential hypertension -Norvasc 5 mg daily.  Left lower extremity rash, concerns for fungal infection -On ketoconazole.  Eucerin cream.  Insomnia -Trazodone nightly as needed  PT/OT evaluation performed.  SNF recommended.  SNF appropriate as the patient has received 3 days of hospital care (or the 3 days stay Has been  made by the patient's insurance company) and is felt to need rehab services to restore this patient to their prior level of function to achieve safe transition back to home care.  This patient needs rehab services for at least 5 days/week and skilled nursing services daily to facilitate this transition.  Rehab is been requested as the most appropriate discharge option for this patient and is not felt to be custodial care as evidenced by prior independent level of functioning now requiring 24/7 supervision..     DVT prophylaxis: Lovenox Code Status: Full code Family Communication: None at bedside Disposition Plan: Difficult placement at skilled nursing facility.  Otherwise she is medically stable.  At this time peer monitoring her mentation as she has poor memory and unsafe for discharge for her to live by herself at home.  Physical therapy recommended SNF  Consultants:   Curbside neurology by previous provider   Antimicrobials:   Completed treatment with Rocephin   Subjective: Feels okay.  Overnight she had episode of confusion and was not able to sleep therefore received Benadryl.  No complaints this morning.  Review of Systems Otherwise negative except as per HPI, including: General = no fevers, chills, dizziness, malaise, fatigue HEENT/EYES = negative for pain, redness, loss of vision, double vision, blurred vision, loss of hearing, sore throat, hoarseness, dysphagia Cardiovascular= negative for chest pain, palpitation, murmurs, lower extremity swelling Respiratory/lungs= negative for shortness of breath, cough, hemoptysis, wheezing, mucus production Gastrointestinal= negative for nausea, vomiting,, abdominal pain, melena, hematemesis Genitourinary= negative for Dysuria, Hematuria, Change in Urinary Frequency MSK = Negative for arthralgia, myalgias, Back Pain, Joint swelling  Neurology= Negative for headache, seizures, numbness, tingling  Psychiatry= Negative for anxiety,  depression, suicidal and homocidal ideation Allergy/Immunology= Medication/Food allergy as listed  Skin=  Negative for Rash, lesions, ulcers, itching   Objective: Vitals:   01/09/19 0805 01/09/19 1615 01/09/19 2322 01/10/19 0824  BP: 103/78 125/73 (!) 120/93 116/85  Pulse: 72 92 88 79  Resp: 16 16 20 17   Temp: 98.5 F (36.9 C) 98.3 F (36.8 C) 98.6 F (37 C) 98.3 F (36.8 C)  TempSrc: Oral Oral  Oral  SpO2: 97% 98% 97% 99%  Weight:      Height:        Intake/Output Summary (Last 24 hours) at 01/10/2019 1047 Last data filed at 01/10/2019 0745 Gross per 24 hour  Intake 240 ml  Output -  Net 240 ml   Filed Weights   12/15/18 0101  Weight: 98.4 kg    Examination:  Constitutional: Not in acute distress, poor dentition Respiratory: Clear to auscultation bilaterally Cardiovascular: Normal sinus rhythm, no rubs Abdomen: Nontender nondistended good bowel sounds Musculoskeletal: No edema noted Skin: No rashes seen Neurologic: CN 2-12 grossly intact.  And nonfocal Psychiatric: Poor judgment and insight.  Alert oriented to name and place.  Short-term memory is poor   Data Reviewed:   CBC: Recent Labs  Lab 01/04/19 0222 01/06/19 0151 01/07/19 0013  WBC 6.9 6.4 6.6  HGB 11.7* 11.7* 12.0  HCT 35.2* 34.7* 35.4*  MCV 87.8 85.9 86.6  PLT 283 270 366   Basic Metabolic Panel: Recent Labs  Lab 01/04/19 0222 01/06/19 0151 01/07/19 0013 01/10/19 0311  NA 140 139 139  --   K 3.7 3.5 3.5  --   CL 106 106 105  --   CO2 26 23 22   --   GLUCOSE 88 87 93  --   BUN 12 9 11   --   CREATININE 0.64 0.55 0.70 0.61  CALCIUM 9.3 9.4 9.3  --   MG 1.9 2.0  --   --    GFR: Estimated Creatinine Clearance: 89 mL/min (by C-G formula based on SCr of 0.61 mg/dL). Liver Function Tests: No results for input(s): AST, ALT, ALKPHOS, BILITOT, PROT, ALBUMIN in the last 168 hours. No results for input(s): LIPASE, AMYLASE in the last 168 hours. No results for input(s): AMMONIA in the last 168  hours. Coagulation Profile: No results for input(s): INR, PROTIME in the last 168 hours. Cardiac Enzymes: No results for input(s): CKTOTAL, CKMB, CKMBINDEX, TROPONINI in the last 168 hours. BNP (last 3 results) No results for input(s): PROBNP in the last 8760 hours. HbA1C: No results for input(s): HGBA1C in the last 72 hours. CBG: No results for input(s): GLUCAP in the last 168 hours. Lipid Profile: No results for input(s): CHOL, HDL, LDLCALC, TRIG, CHOLHDL, LDLDIRECT in the last 72 hours. Thyroid Function Tests: No results for input(s): TSH, T4TOTAL, FREET4, T3FREE, THYROIDAB in the last 72 hours. Anemia Panel: No results for input(s): VITAMINB12, FOLATE, FERRITIN, TIBC, IRON, RETICCTPCT in the last 72 hours. Sepsis Labs: No results for input(s): PROCALCITON, LATICACIDVEN in the last 168 hours.  No results found for this or any previous visit (from the past 240 hour(s)).       Radiology Studies: No results found.      Scheduled Meds: . amLODipine  5 mg Oral Daily  . enoxaparin (LOVENOX) injection  40 mg Subcutaneous Q24H  . feeding supplement (ENSURE ENLIVE)  237 mL Oral BID BM  . folic acid  1 mg Oral Daily  . ketoconazole   Topical BID  . multivitamin with minerals  1 tablet Oral Daily  . nicotine  21 mg Transdermal Daily  .  thiamine  100 mg Oral Daily   Continuous Infusions:   LOS: 28 days   Time spent= 20 mins    Ankit Joline Maxcy, MD [facility]  If 7PM-7AM, please contact night-coverage  01/10/2019, 10:47 AM

## 2019-01-11 NOTE — Progress Notes (Signed)
PROGRESS NOTE    Marie Woods  YJE:[id number] DOB: Mar 23, 1955 DOA: 12/13/2018 PCP: Ladell Pier, MD   Brief Narrative:  63 year old with history of asthma, alcohol abuse, essential hypertension brought to the hospital with complains of confusion and change in mental status.  Patient was found to have urinary tract infection with urine cultures growing multiple species which was treated with Rocephin.  MRI was suggestive of possible Wernicke's encephalopathy treated with IV thiamine and then transition to p.o. thiamine.  Physical therapy recommended skilled nursing facility placement.   Assessment & Plan:   Principal Problem:   AMS (altered mental status) Active Problems:   History of alcohol use   TOBACCO ABUSE   Essential hypertension  Sepsis secondary to polymicrobial urinary tract infection, resolved.  Was present on admission -Completed treatment with Rocephin  Acute metabolic encephalopathy likely from [place] encephalopathy Alcohol use -CT the head negative for any acute finding.  MRI brain was suggestive of possible Wernicke's encephalopathy -Was on IV thiamine, normal p.o. thiamine -Outpatient follow-up with neurology. -Not showing any signs of alcohol withdrawal. -Continues to have short-term memory issues -Continue folate and multivitamin  History of tobacco use -Nicotine patch  Generalized deconditioning -PT-SNF, social worker working on placement.  Intermittent frontal headache -No focal neurologic deficit.  CT of the head and MRI brain has been negative.  Intermittent vertiginous symptoms treated with meclizine  Essential hypertension -Norvasc 5 mg daily.  Left lower extremity rash, concerns for fungal infection -On ketoconazole.  Eucerin cream.  Insomnia -Trazodone nightly as needed  PT/OT evaluation performed.  SNF recommended.  SNF appropriate as the patient has received 3 days of hospital care (or the 3 days stay Has been made by the  patient's insurance company) and is felt to need rehab services to restore this patient to their prior level of function to achieve safe transition back to home care.  This patient needs rehab services for at least 5 days/week and skilled nursing services daily to facilitate this transition.  Rehab is been requested as the most appropriate discharge option for this patient and is not felt to be custodial care as evidenced by prior independent level of functioning now requiring 24/7 supervision..     DVT prophylaxis: Lovenox Code Status: Full code Family Communication: None at bedside Disposition Plan: Difficult placement.  Needs continued hospital stay close to be monitored by the nursing staff while she works with physical therapy.  Unsafe for discharge as she lives at home by herself and has very poor short-term memory  Consultants:   Curbside neurology by previous provider   Antimicrobials:   Completed treatment with Rocephin   Subjective: Does not have any complaints.  Very forgetful and does not remember much as I have to review with her about what is going on with her on daily basis.  Review of Systems Otherwise negative except as per HPI, including: General = no fevers, chills, dizziness, malaise, fatigue HEENT/EYES = negative for pain, redness, loss of vision, double vision, blurred vision, loss of hearing, sore throat, hoarseness, dysphagia Cardiovascular= negative for chest pain, palpitation, murmurs, lower extremity swelling Respiratory/lungs= negative for shortness of breath, cough, hemoptysis, wheezing, mucus production Gastrointestinal= negative for nausea, vomiting,, abdominal pain, melena, hematemesis Genitourinary= negative for Dysuria, Hematuria, Change in Urinary Frequency MSK = Negative for arthralgia, myalgias, Back Pain, Joint swelling  Neurology= Negative for headache, seizures, numbness, tingling  Psychiatry= Negative for anxiety, depression, suicidal and  homocidal ideation Allergy/Immunology= Medication/Food allergy as listed  Skin= Negative  for Rash, lesions, ulcers, itching  Objective: Vitals:   01/09/19 2322 01/10/19 0824 01/10/19 2310 01/11/19 0829  BP: (!) 120/93 116/85 100/64 114/81  Pulse: 88 79 83 79  Resp: 20 17 20    Temp: 98.6 F (37 C) 98.3 F (36.8 C) 98.5 F (36.9 C) 98.3 F (36.8 C)  TempSrc:  Oral  Oral  SpO2: 97% 99% 98% 99%  Weight:      Height:        Intake/Output Summary (Last 24 hours) at 01/11/2019 1051 Last data filed at 01/11/2019 0800 Gross per 24 hour  Intake 240 ml  Output -  Net 240 ml   Filed Weights   12/15/18 0101  Weight: 98.4 kg    Examination:  Constitutional: Not in acute distress Respiratory: Clear to auscultation bilaterally Cardiovascular: Normal sinus rhythm, no rubs Abdomen: Nontender nondistended good bowel sounds Musculoskeletal: No edema noted Skin: No rashes seen Neurologic: CN 2-12 grossly intact.  And nonfocal Psychiatric: Poor judgment and insight.  Alert to name and place but otherwise quite forgetful.  Data Reviewed:   CBC: Recent Labs  Lab 01/06/19 0151 01/07/19 0013  WBC 6.4 6.6  HGB 11.7* 12.0  HCT 34.7* 35.4*  MCV 85.9 86.6  PLT 270 275   Basic Metabolic Panel: Recent Labs  Lab 01/06/19 0151 01/07/19 0013 01/10/19 0311  NA 139 139  --   K 3.5 3.5  --   CL 106 105  --   CO2 23 22  --   GLUCOSE 87 93  --   BUN 9 11  --   CREATININE 0.55 0.70 0.61  CALCIUM 9.4 9.3  --   MG 2.0  --   --    GFR: Estimated Creatinine Clearance: 89 mL/min (by C-G formula based on SCr of 0.61 mg/dL). Liver Function Tests: No results for input(s): AST, ALT, ALKPHOS, BILITOT, PROT, ALBUMIN in the last 168 hours. No results for input(s): LIPASE, AMYLASE in the last 168 hours. No results for input(s): AMMONIA in the last 168 hours. Coagulation Profile: No results for input(s): INR, PROTIME in the last 168 hours. Cardiac Enzymes: No results for input(s): CKTOTAL,  CKMB, CKMBINDEX, TROPONINI in the last 168 hours. BNP (last 3 results) No results for input(s): PROBNP in the last 8760 hours. HbA1C: No results for input(s): HGBA1C in the last 72 hours. CBG: No results for input(s): GLUCAP in the last 168 hours. Lipid Profile: No results for input(s): CHOL, HDL, LDLCALC, TRIG, CHOLHDL, LDLDIRECT in the last 72 hours. Thyroid Function Tests: No results for input(s): TSH, T4TOTAL, FREET4, T3FREE, THYROIDAB in the last 72 hours. Anemia Panel: No results for input(s): VITAMINB12, FOLATE, FERRITIN, TIBC, IRON, RETICCTPCT in the last 72 hours. Sepsis Labs: No results for input(s): PROCALCITON, LATICACIDVEN in the last 168 hours.  No results found for this or any previous visit (from the past 240 hour(s)).       Radiology Studies: No results found.      Scheduled Meds: . amLODipine  5 mg Oral Daily  . enoxaparin (LOVENOX) injection  40 mg Subcutaneous Q24H  . feeding supplement (ENSURE ENLIVE)  237 mL Oral BID BM  . folic acid  1 mg Oral Daily  . ketoconazole   Topical BID  . multivitamin with minerals  1 tablet Oral Daily  . nicotine  21 mg Transdermal Daily  . thiamine  100 mg Oral Daily   Continuous Infusions:   LOS: 29 days   Time spent= 20 mins  Mattilyn Crites Joline Maxcyhirag Jamelah Sitzer, MD [facility]  If 7PM-7AM, please contact night-coverage  01/11/2019, 10:51 AM

## 2019-01-12 NOTE — Progress Notes (Signed)
PROGRESS NOTE    Marie Woods  OVZ:[id number] DOB: 03/04/1955 DOA: 12/13/2018 PCP: Ladell Pier, MD   Brief Narrative:  63 year old with history of asthma, alcohol abuse, essential hypertension brought to the hospital with complains of confusion and change in mental status.  Patient was found to have urinary tract infection with urine cultures growing multiple species which was treated with Rocephin.  MRI was suggestive of possible Wernicke's encephalopathy treated with IV thiamine and then transition to p.o. thiamine.  Physical therapy recommended skilled nursing facility placement.   Assessment & Plan:   Principal Problem:   AMS (altered mental status) Active Problems:   History of alcohol use   TOBACCO ABUSE   Essential hypertension  Sepsis secondary to polymicrobial urinary tract infection, resolved.  Was present on admission -Completed Rocephin.  Acute metabolic encephalopathy likely from [place] encephalopathy Alcohol use -CT the head negative for any acute finding.  MRI brain was suggestive of possible Wernicke's encephalopathy -Completed IV thiamine.  P.o. thiamine now. -Outpatient follow-up with neurology. -Not showing any signs of alcohol withdrawal. -Continues to have short-term memory issues -Continue folate and multivitamin  History of tobacco use -Nicotine patch  Generalized deconditioning -PT-SNF, SW working on placement  Intermittent frontal headache -No focal neurologic deficit.  CT of the head and MRI brain has been negative.  Intermittent vertiginous symptoms treated with meclizine  Essential hypertension -Norvasc 5 mg daily.  Left lower extremity rash, concerns for fungal infection -On ketoconazole.  Eucerin cream.  Insomnia -Trazodone nightly as needed  PT/OT evaluation performed.  SNF recommended.  SNF appropriate as the patient has received 3 days of hospital care (or the 3 days stay Has been made by the patient's insurance company)  and is felt to need rehab services to restore this patient to their prior level of function to achieve safe transition back to home care.  This patient needs rehab services for at least 5 days/week and skilled nursing services daily to facilitate this transition.  Rehab is been requested as the most appropriate discharge option for this patient and is not felt to be custodial care as evidenced by prior independent level of functioning now requiring 24/7 supervision..     DVT prophylaxis: Lovenox Code Status: Full code Family Communication: None at bedside Disposition Plan: Difficult placement, social worker working on this.  Needs continued hospital stay close to be monitored by the nursing staff while she works with physical therapy.  Unsafe for discharge as she lives at home by herself and has very poor short-term memory  Consultants:   Curbside neurology by previous provider   Antimicrobials:   Completed treatment with Rocephin   Subjective: Sitting on chair, no complaints.  No acute events overnight.  Review of Systems Otherwise negative except as per HPI, including: General = no fevers, chills, dizziness, malaise, fatigue HEENT/EYES = negative for pain, redness, loss of vision, double vision, blurred vision, loss of hearing, sore throat, hoarseness, dysphagia Cardiovascular= negative for chest pain, palpitation, murmurs, lower extremity swelling Respiratory/lungs= negative for shortness of breath, cough, hemoptysis, wheezing, mucus production Gastrointestinal= negative for nausea, vomiting,, abdominal pain, melena, hematemesis Genitourinary= negative for Dysuria, Hematuria, Change in Urinary Frequency MSK = Negative for arthralgia, myalgias, Back Pain, Joint swelling  Neurology= Negative for headache, seizures, numbness, tingling  Psychiatry= Negative for anxiety, depression, suicidal and homocidal ideation Allergy/Immunology= Medication/Food allergy as listed  Skin= Negative  for Rash, lesions, ulcers, itching   Objective: Vitals:   01/11/19 1624 01/11/19 2016 01/11/19 2300 01/12/19  0832  BP: 117/88 116/86 120/80 111/75  Pulse: 88 90 87 77  Resp:  17 16 16   Temp: (!) 97 F (36.1 C) 97.6 F (36.4 C) 97.7 F (36.5 C) 98 F (36.7 C)  TempSrc: Oral Oral Oral Oral  SpO2: 100% 97% 97% 98%  Weight:      Height:       No intake or output data in the 24 hours ending 01/12/19 1236 Filed Weights   12/15/18 0101  Weight: 98.4 kg    Examination:  Constitutional: Not in acute distress, poor dentition Respiratory: Clear to auscultation bilaterally Cardiovascular: Normal sinus rhythm, no rubs Abdomen: Nontender nondistended good bowel sounds Musculoskeletal: No edema noted Skin: No rashes seen Neurologic: CN 2-12 grossly intact.  And nonfocal Psychiatric: Alert to name and place.  Oriented, pleasant with overall poor judgment and insight.   Data Reviewed:   CBC: Recent Labs  Lab 01/06/19 0151 01/07/19 0013  WBC 6.4 6.6  HGB 11.7* 12.0  HCT 34.7* 35.4*  MCV 85.9 86.6  PLT 270 275   Basic Metabolic Panel: Recent Labs  Lab 01/06/19 0151 01/07/19 0013 01/10/19 0311  NA 139 139  --   K 3.5 3.5  --   CL 106 105  --   CO2 23 22  --   GLUCOSE 87 93  --   BUN 9 11  --   CREATININE 0.55 0.70 0.61  CALCIUM 9.4 9.3  --   MG 2.0  --   --    GFR: Estimated Creatinine Clearance: 89 mL/min (by C-G formula based on SCr of 0.61 mg/dL). Liver Function Tests: No results for input(s): AST, ALT, ALKPHOS, BILITOT, PROT, ALBUMIN in the last 168 hours. No results for input(s): LIPASE, AMYLASE in the last 168 hours. No results for input(s): AMMONIA in the last 168 hours. Coagulation Profile: No results for input(s): INR, PROTIME in the last 168 hours. Cardiac Enzymes: No results for input(s): CKTOTAL, CKMB, CKMBINDEX, TROPONINI in the last 168 hours. BNP (last 3 results) No results for input(s): PROBNP in the last 8760 hours. HbA1C: No results for  input(s): HGBA1C in the last 72 hours. CBG: No results for input(s): GLUCAP in the last 168 hours. Lipid Profile: No results for input(s): CHOL, HDL, LDLCALC, TRIG, CHOLHDL, LDLDIRECT in the last 72 hours. Thyroid Function Tests: No results for input(s): TSH, T4TOTAL, FREET4, T3FREE, THYROIDAB in the last 72 hours. Anemia Panel: No results for input(s): VITAMINB12, FOLATE, FERRITIN, TIBC, IRON, RETICCTPCT in the last 72 hours. Sepsis Labs: No results for input(s): PROCALCITON, LATICACIDVEN in the last 168 hours.  No results found for this or any previous visit (from the past 240 hour(s)).       Radiology Studies: No results found.      Scheduled Meds: . amLODipine  5 mg Oral Daily  . enoxaparin (LOVENOX) injection  40 mg Subcutaneous Q24H  . feeding supplement (ENSURE ENLIVE)  237 mL Oral BID BM  . folic acid  1 mg Oral Daily  . ketoconazole   Topical BID  . multivitamin with minerals  1 tablet Oral Daily  . nicotine  21 mg Transdermal Daily  . thiamine  100 mg Oral Daily   Continuous Infusions:   LOS: 30 days   Time spent= 20 mins    Ankit 14/08/20, MD [facility]  If 7PM-7AM, please contact night-coverage  01/12/2019, 12:36 PM

## 2019-01-12 NOTE — Progress Notes (Signed)
Physical Therapy Treatment Patient Details Name: Marie Woods MRN: [id number] DOB: [birth year] Today's Date: 01/12/2019    History of Present Illness Patient is a 63 year old female with medical history significant of asthma, hypertension, alcohol abuse who was brought in by EMS after neighbors called in stating that patient is confused and altered. Patient admitted Severe Sepsis with Acute metabolic encephalopathy secondary to UTI.     PT Comments    Patient received up in chair, very pleasant and motivated to work with therapy but continues to ask "where are my keys/wallet/phone?", very easily distracted but also very easily redirected. Able to perform functional transfers and gait with min guard, requested to use RW today and continues to tolerate gait distance of approximately 374f although has very poor safety awareness and required constant cuing for safe use of device especially on turns. She was left up in the chair with all needs met this morning. Continue to recommend SNF due to poor safety awareness and cognition deficits.     Follow Up Recommendations  SNF;Supervision/Assistance - 24 hour     Equipment Recommendations  Rolling walker with 5" wheels    Recommendations for Other Services       Precautions / Restrictions Precautions Precautions: Fall Precaution Comments: was on contact for bed bugs, not on on 11/16 Restrictions Weight Bearing Restrictions: No    Mobility  Bed Mobility               General bed mobility comments: OOB in recliner upon arrival  Transfers Overall transfer level: Needs assistance Equipment used: None Transfers: Sit to/from Stand Sit to Stand: Min guard         General transfer comment: min guard for safety due to mild unsteadiness without B UE support  Ambulation/Gait Ambulation/Gait assistance: Min guard Gait Distance (Feet): 350 Feet Assistive device: Rolling walker (2 wheeled) Gait Pattern/deviations: Step-through  pattern;Trunk flexed;Drifts right/left;Wide base of support Gait velocity: decreased   General Gait Details: flexed posture with ongoing poor foot clearance; furniture walks without RW. Cues to stay close to walker, even so tends to push RW far away from her especially when turning   Stairs             Wheelchair Mobility    Modified Rankin (Stroke Patients Only)       Balance Overall balance assessment: Needs assistance Sitting-balance support: Feet supported;No upper extremity supported Sitting balance-Leahy Scale: Good     Standing balance support: Bilateral upper extremity supported;Single extremity supported Standing balance-Leahy Scale: Fair Standing balance comment: benefits from BUE support on RW                            Cognition Arousal/Alertness: Awake/alert Behavior During Therapy: Impulsive Overall Cognitive Status: No family/caregiver present to determine baseline cognitive functioning Area of Impairment: Following commands;Safety/judgement;Orientation;Attention;Memory                 Orientation Level: Disoriented to;Situation;Time;Place Current Attention Level: Sustained Memory: Decreased short-term memory Following Commands: Follows one step commands consistently;Follows one step commands with increased time Safety/Judgement: Decreased awareness of safety;Decreased awareness of deficits Awareness: Emergent Problem Solving: Difficulty sequencing;Requires verbal cues;Requires tactile cues General Comments: very easily distracted but also very easily redirected      Exercises      General Comments        Pertinent Vitals/Pain Pain Assessment: No/denies pain Pain Score: 0-No pain Pain Intervention(s): Limited activity within patient's tolerance;Monitored during  session    Home Living                      Prior Function            PT Goals (current goals can now be found in the care plan section) Acute Rehab  PT Goals Patient Stated Goal: to go home PT Goal Formulation: With patient Time For Goal Achievement: 01/26/19 Potential to Achieve Goals: Fair Progress towards PT goals: Progressing toward goals    Frequency    Min 3X/week      PT Plan Current plan remains appropriate    Co-evaluation              AM-PAC PT "6 Clicks" Mobility   Outcome Measure  Help needed turning from your back to your side while in a flat bed without using bedrails?: None Help needed moving from lying on your back to sitting on the side of a flat bed without using bedrails?: None Help needed moving to and from a bed to a chair (including a wheelchair)?: A Little Help needed standing up from a chair using your arms (e.g., wheelchair or bedside chair)?: A Little Help needed to walk in hospital room?: A Little Help needed climbing 3-5 steps with a railing? : A Little 6 Click Score: 20    End of Session   Activity Tolerance: Patient tolerated treatment well Patient left: in chair;with call bell/phone within reach   PT Visit Diagnosis: Other abnormalities of gait and mobility (R26.89);Muscle weakness (generalized) (M62.81);Difficulty in walking, not elsewhere classified (R26.2)     Time: 0735-4301 PT Time Calculation (min) (ACUTE ONLY): 12 min  Charges:  $Gait Training: 8-22 mins                     Windell Norfolk, DPT, PN1   Supplemental Physical Therapist [place]    Pager [phone] Acute Rehab Office [phone]

## 2019-01-13 MED ORDER — METHYLPREDNISOLONE SODIUM SUCC 40 MG IJ SOLR
40.0000 mg | Freq: Once | INTRAMUSCULAR | Status: AC
  Administered 2019-01-13: 40 mg via INTRAVENOUS
  Filled 2019-01-13: qty 1

## 2019-01-13 MED ORDER — FAMOTIDINE IN NACL 20-0.9 MG/50ML-% IV SOLN
20.0000 mg | Freq: Once | INTRAVENOUS | Status: DC
  Filled 2019-01-13: qty 50

## 2019-01-13 MED ORDER — DIPHENHYDRAMINE HCL 50 MG/ML IJ SOLN
25.0000 mg | Freq: Once | INTRAMUSCULAR | Status: AC
  Administered 2019-01-13: 25 mg via INTRAVENOUS
  Filled 2019-01-13: qty 1

## 2019-01-13 NOTE — Progress Notes (Signed)
Some nonspecific facial erythema, itching and questionable swelling after eating lunch. Unknown exact trigger.    Ordered Solumedrol IV, Pepcid and Benadryl. Will cont to monitor.   Marie Woods

## 2019-01-13 NOTE — Progress Notes (Signed)
PROGRESS NOTE    Marie Woods  ZOX:[id number] DOB: 18-May-1955 DOA: 12/13/2018 PCP: Ladell Pier, MD   Brief Narrative:  63 year old with history of asthma, alcohol abuse, essential hypertension brought to the hospital with complains of confusion and change in mental status.  Patient was found to have urinary tract infection with urine cultures growing multiple species which was treated with Rocephin.  MRI was suggestive of possible Wernicke's encephalopathy treated with IV thiamine and then transition to p.o. thiamine.  Physical therapy recommended skilled nursing facility placement.  Social worker working on Office manager & Plan:   Principal Problem:   AMS (altered mental status) Active Problems:   History of alcohol use   TOBACCO ABUSE   Essential hypertension  Sepsis secondary to polymicrobial urinary tract infection, resolved.  Was present on admission -Resolved completed Rocephin course  Acute metabolic encephalopathy likely from [place] encephalopathy, greatly improved Alcohol use -CT the head negative for any acute finding.  MRI brain was suggestive of possible Wernicke's encephalopathy -Completed IV thiamine.  P.o. thiamine now. -Outpatient follow-up with neurology. -Not showing any signs of alcohol withdrawal. -Continues to have short-term memory issues. -Continue folate and multivitamin  History of tobacco use -Nicotine patch  Generalized deconditioning -PT-SNF, social worker working on placement  Intermittent frontal headache -No focal neurologic deficit.  CT of the head and MRI brain has been negative.  Intermittent vertiginous symptoms treated with meclizine  Essential hypertension -Norvasc 5 mg daily.  Left lower extremity rash, concerns for fungal infection -On ketoconazole.  Eucerin cream.  Insomnia -Trazodone nightly as needed  PT/OT evaluation performed.  SNF recommended.  SNF appropriate as the patient has received 3 days of  hospital care (or the 3 days stay Has been made by the patient's insurance company) and is felt to need rehab services to restore this patient to their prior level of function to achieve safe transition back to home care.  This patient needs rehab services for at least 5 days/week and skilled nursing services daily to facilitate this transition.  Rehab is been requested as the most appropriate discharge option for this patient and is not felt to be custodial care as evidenced by prior independent level of functioning now requiring 24/7 supervision..     DVT prophylaxis: Lovenox Code Status: Full code Family Communication: None at bedside Disposition Plan: Difficult placement but social worker aware.  Due to her poor memory, lack of social support and in need for rehab-unsafe for discharge at home.  We are hopeful that with therapy she can return to pre-existing living condition.  Currently requires nursing supervision as she rehabilitates.  Consultants:   Curbside neurology by previous provider   Antimicrobials:   Completed treatment with Rocephin   Subjective: Sitting up in the chair no complaints.  Continues to ask me for her keys/wallet and fullness.  Follows all the commands.  Review of Systems Otherwise negative except as per HPI, including: General = no fevers, chills, dizziness, malaise, fatigue HEENT/EYES = negative for pain, redness, loss of vision, double vision, blurred vision, loss of hearing, sore throat, hoarseness, dysphagia Cardiovascular= negative for chest pain, palpitation, murmurs, lower extremity swelling Respiratory/lungs= negative for shortness of breath, cough, hemoptysis, wheezing, mucus production Gastrointestinal= negative for nausea, vomiting,, abdominal pain, melena, hematemesis Genitourinary= negative for Dysuria, Hematuria, Change in Urinary Frequency MSK = Negative for arthralgia, myalgias, Back Pain, Joint swelling  Neurology= Negative for headache,  seizures, numbness, tingling  Psychiatry= Negative for anxiety, depression, suicidal and homocidal  ideation Allergy/Immunology= Medication/Food allergy as listed  Skin= Negative for Rash, lesions, ulcers, itching   Objective: Vitals:   01/12/19 0832 01/12/19 1634 01/12/19 2258 01/13/19 0846  BP: 111/75 134/78 140/68 114/85  Pulse: 77 87 78 93  Resp: 16 18 19 18   Temp: 98 F (36.7 C) 98.3 F (36.8 C) 98.4 F (36.9 C) 98 F (36.7 C)  TempSrc: Oral Oral Oral Oral  SpO2: 98% 100% 100% 98%  Weight:      Height:       No intake or output data in the 24 hours ending 01/13/19 1054 Filed Weights   12/15/18 0101  Weight: 98.4 kg    Examination:  Constitutional: Not in acute distress, poor dentition Respiratory: Clear to auscultation bilaterally Cardiovascular: Normal sinus rhythm, no rubs Abdomen: Nontender nondistended good bowel sounds Musculoskeletal: No edema noted Skin: No rashes seen Neurologic: CN 2-12 grossly intact.  And nonfocal.  Poor short-term memory. Psychiatric: Poor judgment and insight.  Alert to name and place.    Data Reviewed:   CBC: Recent Labs  Lab 01/07/19 0013  WBC 6.6  HGB 12.0  HCT 35.4*  MCV 86.6  PLT 275   Basic Metabolic Panel: Recent Labs  Lab 01/07/19 0013 01/10/19 0311  NA 139  --   K 3.5  --   CL 105  --   CO2 22  --   GLUCOSE 93  --   BUN 11  --   CREATININE 0.70 0.61  CALCIUM 9.3  --    GFR: Estimated Creatinine Clearance: 89 mL/min (by C-G formula based on SCr of 0.61 mg/dL). Liver Function Tests: No results for input(s): AST, ALT, ALKPHOS, BILITOT, PROT, ALBUMIN in the last 168 hours. No results for input(s): LIPASE, AMYLASE in the last 168 hours. No results for input(s): AMMONIA in the last 168 hours. Coagulation Profile: No results for input(s): INR, PROTIME in the last 168 hours. Cardiac Enzymes: No results for input(s): CKTOTAL, CKMB, CKMBINDEX, TROPONINI in the last 168 hours. BNP (last 3 results) No results  for input(s): PROBNP in the last 8760 hours. HbA1C: No results for input(s): HGBA1C in the last 72 hours. CBG: No results for input(s): GLUCAP in the last 168 hours. Lipid Profile: No results for input(s): CHOL, HDL, LDLCALC, TRIG, CHOLHDL, LDLDIRECT in the last 72 hours. Thyroid Function Tests: No results for input(s): TSH, T4TOTAL, FREET4, T3FREE, THYROIDAB in the last 72 hours. Anemia Panel: No results for input(s): VITAMINB12, FOLATE, FERRITIN, TIBC, IRON, RETICCTPCT in the last 72 hours. Sepsis Labs: No results for input(s): PROCALCITON, LATICACIDVEN in the last 168 hours.  No results found for this or any previous visit (from the past 240 hour(s)).       Radiology Studies: No results found.      Scheduled Meds: . amLODipine  5 mg Oral Daily  . enoxaparin (LOVENOX) injection  40 mg Subcutaneous Q24H  . feeding supplement (ENSURE ENLIVE)  237 mL Oral BID BM  . folic acid  1 mg Oral Daily  . ketoconazole   Topical BID  . multivitamin with minerals  1 tablet Oral Daily  . nicotine  21 mg Transdermal Daily  . thiamine  100 mg Oral Daily   Continuous Infusions:   LOS: 31 days   Time spent= 15 mins    Torrie Namba 14/08/20, MD [facility]  If 7PM-7AM, please contact night-coverage  01/13/2019, 10:54 AM

## 2019-01-13 NOTE — TOC Progression Note (Signed)
Transition of Care [facility]) - Progression Note    Patient Details  Name: Marie Woods MRN: [id number] Date of Birth: Apr 10, 1955  Transition of Care [facility]) CM/SW [place], LCSW Phone Number: 01/13/2019, 11:12 AM  Clinical Narrative:    Patient remains on [facility] SNF waitlist.   Expected Discharge Plan: [place] Barriers to Discharge: No SNF bed, SNF Pending payor source - LOG  Expected Discharge Plan and Services Expected Discharge Plan: [place] In-house Referral: Clinical Social Work Discharge Planning Services: NA Post Acute Care Choice: [place] Living arrangements for the past 2 months: Apartment                 DME Arranged: N/A DME Agency: NA                   Social Determinants of Health (SDOH) Interventions    Readmission Risk Interventions No flowsheet data found.

## 2019-01-14 NOTE — Progress Notes (Signed)
PROGRESS NOTE    Marie Woods  VHQ:[id number] DOB: [birth year] DOA: 12/13/2018 PCP: Ladell Pier, MD   Brief Narrative:  63 year old with history of asthma, alcohol abuse, essential hypertension brought to the hospital with complains of confusion and change in mental status.  Patient was found to have urinary tract infection with urine cultures growing multiple species which was treated with Rocephin.  MRI was suggestive of possible Wernicke's encephalopathy treated with IV thiamine and then transition to p.o. thiamine.  Physical therapy recommended skilled nursing facility placement.  Social worker working on Office manager & Plan:   Principal Problem:   AMS (altered mental status) Active Problems:   History of alcohol use   TOBACCO ABUSE   Essential hypertension  Sepsis secondary to polymicrobial urinary tract infection, resolved.  Was present on admission -Resolved completed Rocephin course  Acute metabolic encephalopathy likely from [place] encephalopathy, greatly improved Alcohol use -CT the head negative for any acute finding.  MRI brain was suggestive of possible Wernicke's encephalopathy -Completed IV thiamine.  P.o. thiamine now. -Outpatient follow-up with neurology. -Not showing any signs of alcohol withdrawal. -Continues to have short-term memory issues. -Folic acid, multivitamin  Allergic reaction, resolved -Patient had allergic reaction yesterday days after lunch.  Appears to have resolved.  This was treated with IV Solu-Medrol, Benadryl and Pepcid  History of tobacco use -Nicotine patch  Generalized deconditioning -PT-SNF, social worker working on placement  Intermittent frontal headache -No focal neurologic deficit.  CT of the head and MRI brain has been negative.  Intermittent vertiginous symptoms treated with meclizine  Essential hypertension -Norvasc 5 mg daily.  Left lower extremity rash, concerns for fungal infection -On  ketoconazole.  Eucerin cream.  Insomnia -Trazodone nightly as needed  PT/OT evaluation performed.  SNF recommended.  SNF appropriate as the patient has received 3 days of hospital care (or the 3 days stay Has been made by the patient's insurance company) and is felt to need rehab services to restore this patient to their prior level of function to achieve safe transition back to home care.  This patient needs rehab services for at least 5 days/week and skilled nursing services daily to facilitate this transition.  Rehab is been requested as the most appropriate discharge option for this patient and is not felt to be custodial care as evidenced by prior independent level of functioning now requiring 24/7 supervision..     DVT prophylaxis: Lovenox Code Status: Full code Family Communication: None at bedside Disposition Plan: Difficult placement but social worker aware.  Due to her poor memory, lack of social support and in need for rehab-unsafe for discharge at home.  We are hopeful that with therapy she can return to pre-existing living condition.  Currently requires nursing supervision as she rehabilitates.  Consultants:   Curbside neurology by previous provider   Antimicrobials:   Completed treatment with Rocephin   Subjective: Yesterday afternoon after her lunch she had allergic reaction on her face with itching and erythema which was treated with IV Solu-Medrol, Benadryl and Pepcid no identified triggers. This morning she is doing well does not have any complaints..  Review of Systems Otherwise negative except as per HPI, including: General = no fevers, chills, dizziness, malaise, fatigue HEENT/EYES = negative for pain, redness, loss of vision, double vision, blurred vision, loss of hearing, sore throat, hoarseness, dysphagia Cardiovascular= negative for chest pain, palpitation, murmurs, lower extremity swelling Respiratory/lungs= negative for shortness of breath, cough,  hemoptysis, wheezing, mucus production Gastrointestinal= negative  for nausea, vomiting,, abdominal pain, melena, hematemesis Genitourinary= negative for Dysuria, Hematuria, Change in Urinary Frequency MSK = Negative for arthralgia, myalgias, Back Pain, Joint swelling  Neurology= Negative for headache, seizures, numbness, tingling  Psychiatry= Negative for anxiety, depression, suicidal and homocidal ideation Allergy/Immunology= Medication/Food allergy as listed  Skin= Negative for Rash, lesions, ulcers, itching   Objective: Vitals:   01/13/19 0846 01/13/19 1718 01/13/19 2324 01/14/19 0832  BP: 114/85 117/84 105/68 117/83  Pulse: 93 93 93 91  Resp: 18 20 18 18   Temp: 98 F (36.7 C) 98.2 F (36.8 C) 97.8 F (36.6 C) 97.7 F (36.5 C)  TempSrc: Oral Oral Oral Oral  SpO2: 98% 99% 99% 100%  Weight:      Height:       No intake or output data in the 24 hours ending 01/14/19 1102 Filed Weights   12/15/18 0101  Weight: 98.4 kg    Examination:  Constitutional: Not in acute distress, poor dentition Respiratory: Clear to auscultation bilaterally Cardiovascular: Normal sinus rhythm, no rubs Abdomen: Nontender nondistended good bowel sounds Musculoskeletal: No edema noted Skin: No rashes seen Neurologic: CN 2-12 grossly intact.  And nonfocal Psychiatric: Poor judgment and insight, very forgetful but alert to name and place.   Data Reviewed:   CBC: No results for input(s): WBC, NEUTROABS, HGB, HCT, MCV, PLT in the last 168 hours. Basic Metabolic Panel: Recent Labs  Lab 01/10/19 0311  CREATININE 0.61   GFR: Estimated Creatinine Clearance: 89 mL/min (by C-G formula based on SCr of 0.61 mg/dL). Liver Function Tests: No results for input(s): AST, ALT, ALKPHOS, BILITOT, PROT, ALBUMIN in the last 168 hours. No results for input(s): LIPASE, AMYLASE in the last 168 hours. No results for input(s): AMMONIA in the last 168 hours. Coagulation Profile: No results for input(s): INR,  PROTIME in the last 168 hours. Cardiac Enzymes: No results for input(s): CKTOTAL, CKMB, CKMBINDEX, TROPONINI in the last 168 hours. BNP (last 3 results) No results for input(s): PROBNP in the last 8760 hours. HbA1C: No results for input(s): HGBA1C in the last 72 hours. CBG: No results for input(s): GLUCAP in the last 168 hours. Lipid Profile: No results for input(s): CHOL, HDL, LDLCALC, TRIG, CHOLHDL, LDLDIRECT in the last 72 hours. Thyroid Function Tests: No results for input(s): TSH, T4TOTAL, FREET4, T3FREE, THYROIDAB in the last 72 hours. Anemia Panel: No results for input(s): VITAMINB12, FOLATE, FERRITIN, TIBC, IRON, RETICCTPCT in the last 72 hours. Sepsis Labs: No results for input(s): PROCALCITON, LATICACIDVEN in the last 168 hours.  No results found for this or any previous visit (from the past 240 hour(s)).       Radiology Studies: No results found.      Scheduled Meds: . amLODipine  5 mg Oral Daily  . enoxaparin (LOVENOX) injection  40 mg Subcutaneous Q24H  . feeding supplement (ENSURE ENLIVE)  237 mL Oral BID BM  . folic acid  1 mg Oral Daily  . ketoconazole   Topical BID  . multivitamin with minerals  1 tablet Oral Daily  . nicotine  21 mg Transdermal Daily  . thiamine  100 mg Oral Daily   Continuous Infusions: . famotidine (PEPCID) IV       LOS: 32 days   Time spent= 15 mins    Marie Woods 14/08/20, MD [facility]  If 7PM-7AM, please contact night-coverage  01/14/2019, 11:02 AM

## 2019-01-15 NOTE — Progress Notes (Signed)
PROGRESS NOTE    Marie Woods  [id number] DOB: Jun 04, 1955 DOA: 12/13/2018 PCP: Marcine Matar, MD   Brief Narrative:  63 year old with history of asthma, alcohol abuse, essential hypertension brought to the hospital with complains of confusion and change in mental status.  Patient was found to have urinary tract infection with urine cultures growing multiple species which was treated with Rocephin.  MRI was suggestive of possible Wernicke's encephalopathy treated with IV thiamine and then transition to p.o. thiamine.  Physical therapy recommended skilled nursing facility placement.  Social worker working on [employer] & Plan:   Principal Problem:   AMS (altered mental status) Active Problems:   History of alcohol use   TOBACCO ABUSE   Essential hypertension  Sepsis secondary to polymicrobial urinary tract infection, resolved.  Was present on admission -Completed Rocephin course  Acute metabolic encephalopathy likely from [place] encephalopathy, stable. Alcohol use -Lots of short-term memory issues.  CT the head negative for any acute finding.  MRI brain was suggestive of possible Wernicke's encephalopathy -Completed IV thiamine.  P.o. thiamine now. -Outpatient follow-up with neurology. -Not showing any signs of alcohol withdrawal. -Folic acid, multivitamin  Allergic reaction, resolved -Patient had allergic reaction yesterday days after lunch.  Appears to have resolved.  This was treated with IV Solu-Medrol, Benadryl and Pepcid  History of tobacco use -Nicotine patch  Generalized deconditioning -PT-SNF, social worker working on placement  Intermittent frontal headache -No focal neurologic deficit.  CT of the head and MRI brain has been negative.  Intermittent vertiginous symptoms treated with meclizine  Essential hypertension -Norvasc 5 mg daily.  Left lower extremity rash, concerns for fungal infection -On ketoconazole.  Eucerin  cream.  Insomnia -Trazodone nightly as needed  PT/OT evaluation performed.  SNF recommended.  SNF appropriate as the patient has received 3 days of hospital care (or the 3 days stay Has been made by the patient's insurance company) and is felt to need rehab services to restore this patient to their prior level of function to achieve safe transition back to home care.  This patient needs rehab services for at least 5 days/week and skilled nursing services daily to facilitate this transition.  Rehab is been requested as the most appropriate discharge option for this patient and is not felt to be custodial care as evidenced by prior independent level of functioning now requiring 24/7 supervision..     DVT prophylaxis: Lovenox Code Status: Full code Family Communication: None at bedside Disposition Plan: Difficult placement, working on safe disposition.  Consultants:   Curbside neurology by previous provider   Antimicrobials:   Completed treatment with Rocephin   Subjective: No complaints this morning, keeps asking for her cell phone despite of there being signs around his but she did not bring 1. Pleasantly confused.  Review of Systems Otherwise negative except as per HPI, including: General = no fevers, chills, dizziness, malaise, fatigue HEENT/EYES = negative for pain, redness, loss of vision, double vision, blurred vision, loss of hearing, sore throat, hoarseness, dysphagia Cardiovascular= negative for chest pain, palpitation, murmurs, lower extremity swelling Respiratory/lungs= negative for shortness of breath, cough, hemoptysis, wheezing, mucus production Gastrointestinal= negative for nausea, vomiting,, abdominal pain, melena, hematemesis Genitourinary= negative for Dysuria, Hematuria, Change in Urinary Frequency MSK = Negative for arthralgia, myalgias, Back Pain, Joint swelling  Neurology= Negative for headache, seizures, numbness, tingling  Psychiatry= Negative for anxiety,  depression, suicidal and homocidal ideation Allergy/Immunology= Medication/Food allergy as listed  Skin= Negative for Rash, lesions, ulcers, itching  Objective: Vitals:   01/14/19 0832 01/14/19 1652 01/14/19 2248 01/15/19 0754  BP: 117/83 122/83 113/82 111/86  Pulse: 91 87 83 85  Resp: 18 19 18    Temp: 97.7 F (36.5 C) 98.3 F (36.8 C) 98.5 F (36.9 C) 98.4 F (36.9 C)  TempSrc: Oral  Oral Oral  SpO2: 100% 99% 98% 99%  Weight:      Height:        Intake/Output Summary (Last 24 hours) at 01/15/2019 1230 Last data filed at 01/15/2019 1000 Gross per 24 hour  Intake 360 ml  Output --  Net 360 ml   Filed Weights   12/15/18 0101  Weight: 98.4 kg    Examination: Constitutional: Not in acute distress, poor dentition Respiratory: Clear to auscultation bilaterally Cardiovascular: Normal sinus rhythm, no rubs Abdomen: Nontender nondistended good bowel sounds Musculoskeletal: No edema noted Skin: No rashes seen Neurologic: CN 2-12 grossly intact.  And nonfocal Psychiatric: Poor judgment and insight.  Alert to name and place only   Data Reviewed:   CBC: No results for input(s): WBC, NEUTROABS, HGB, HCT, MCV, PLT in the last 168 hours. Basic Metabolic Panel: Recent Labs  Lab 01/10/19 0311  CREATININE 0.61   GFR: Estimated Creatinine Clearance: 89 mL/min (by C-G formula based on SCr of 0.61 mg/dL). Liver Function Tests: No results for input(s): AST, ALT, ALKPHOS, BILITOT, PROT, ALBUMIN in the last 168 hours. No results for input(s): LIPASE, AMYLASE in the last 168 hours. No results for input(s): AMMONIA in the last 168 hours. Coagulation Profile: No results for input(s): INR, PROTIME in the last 168 hours. Cardiac Enzymes: No results for input(s): CKTOTAL, CKMB, CKMBINDEX, TROPONINI in the last 168 hours. BNP (last 3 results) No results for input(s): PROBNP in the last 8760 hours. HbA1C: No results for input(s): HGBA1C in the last 72 hours. CBG: No results for  input(s): GLUCAP in the last 168 hours. Lipid Profile: No results for input(s): CHOL, HDL, LDLCALC, TRIG, CHOLHDL, LDLDIRECT in the last 72 hours. Thyroid Function Tests: No results for input(s): TSH, T4TOTAL, FREET4, T3FREE, THYROIDAB in the last 72 hours. Anemia Panel: No results for input(s): VITAMINB12, FOLATE, FERRITIN, TIBC, IRON, RETICCTPCT in the last 72 hours. Sepsis Labs: No results for input(s): PROCALCITON, LATICACIDVEN in the last 168 hours.  No results found for this or any previous visit (from the past 240 hour(s)).       Radiology Studies: No results found.      Scheduled Meds: . amLODipine  5 mg Oral Daily  . enoxaparin (LOVENOX) injection  40 mg Subcutaneous Q24H  . feeding supplement (ENSURE ENLIVE)  237 mL Oral BID BM  . folic acid  1 mg Oral Daily  . ketoconazole   Topical BID  . multivitamin with minerals  1 tablet Oral Daily  . nicotine  21 mg Transdermal Daily  . thiamine  100 mg Oral Daily   Continuous Infusions: . famotidine (PEPCID) IV       LOS: 33 days   Time spent= 15 mins    Samiha Denapoli Arsenio Loader, MD [facility]  If 7PM-7AM, please contact night-coverage  01/15/2019, 12:30 PM

## 2019-01-15 NOTE — Progress Notes (Signed)
Pt removed her peripheral IV, refuses a new IV. MD notified

## 2019-01-16 NOTE — Progress Notes (Signed)
Occupational Therapy Treatment Patient Details Name: Marie Woods MRN: [id number] DOB: Jun 14, 1955 Today's Date: 01/16/2019    History of present illness Patient is a 63 year old female with medical history significant of asthma, hypertension, alcohol abuse who was brought in by EMS after neighbors called in stating that patient is confused and altered. Patient admitted Severe Sepsis with Acute metabolic encephalopathy secondary to UTI.    OT comments  Pt continues to make very slow progress toward goals.  Goals now updated to reflect new time frame. Overall, pt requires min guard to supervision with basic adls and adl mobility. Pt is safe in standing but not safe once she begins walking with the walker. Gilford Rile is often too far in front of her or she forgets to use it.  Pt is very limited by her decreased STM which affects her ability to be alone at home at this time.  Pt does not recall things from one minute to the next.  Goals are set for Mod I and those goals are to be met in two weeks, but this pt may never reach mod I at this point due to this persistent memory loss and cognitive deficits. Pt may be someone that functions at the min guard to S level for awhile but will given pt time to attempt to reach modified independent.  Focus will continue to be on safety when on her feet and may implement use of a memory book her in room if she is interested in recalling what her days are like in hospital.  Pt could not believe she had been here since 11/10.  Will continue to follow.  Follow Up Recommendations  SNF;Supervision/Assistance - 24 hour    Equipment Recommendations       Recommendations for Other Services      Precautions / Restrictions Precautions Precautions: Fall Restrictions Weight Bearing Restrictions: No       Mobility Bed Mobility Overal bed mobility: Needs Assistance Bed Mobility: Supine to Sit;Sit to Supine     Supine to sit: Supervision Sit to supine: Supervision       Transfers Overall transfer level: Needs assistance Equipment used: None Transfers: Sit to/from Stand Sit to Stand: Supervision         General transfer comment: supervision and cues for safety    Balance Overall balance assessment: Needs assistance Sitting-balance support: Feet supported;No upper extremity supported Sitting balance-Leahy Scale: Good Sitting balance - Comments: static sitting balance EOB without LOB   Standing balance support: Bilateral upper extremity supported;Single extremity supported Standing balance-Leahy Scale: Fair Standing balance comment: benefits from BUE support on RW                           ADL either performed or assessed with clinical judgement   ADL Overall ADL's : Needs assistance/impaired Eating/Feeding: Independent;Sitting   Grooming: Wash/dry hands;Wash/dry face;Oral care;Brushing hair;Supervision/safety;Standing Grooming Details (indicate cue type and reason): Pt stood at sink for 5 minutes with no LoB to groom.  Pt safe when standing but becomes unsafe when she ambulates.         Upper Body Dressing : Minimal assistance;Sitting Upper Body Dressing Details (indicate cue type and reason): Pt found with gown on open in the front and nothing on underneath.  Pt stated this is how she wears her gowns at home but she could not figure out why she was not covered.  pt with poor problem solving skills. Lower Body Dressing: Supervision/safety;Sit  to/from stand;Cueing for compensatory techniques Lower Body Dressing Details (indicate cue type and reason): no physical assist needed. VCs given to stay on task. Toilet Transfer: Min guard;Ambulation;Cueing for safety;RW Toilet Transfer Details (indicate cue type and reason): pt walked to bathroom and physical assist not required but moderate verbal cues needed for safety.   Toileting- Water quality scientist and Hygiene: Min guard;Sit to/from stand;Cueing for safety       Functional  mobility during ADLs: Min guard;Rolling walker General ADL Comments: decreased safety with use of rolling walker     Vision   Vision Assessment?: No apparent visual deficits   Perception     Praxis      Cognition Arousal/Alertness: Awake/alert Behavior During Therapy: Impulsive Overall Cognitive Status: No family/caregiver present to determine baseline cognitive functioning Area of Impairment: Orientation;Memory;Safety/judgement;Awareness;Problem solving                 Orientation Level: Time;Situation Current Attention Level: Sustained Memory: Decreased short-term memory Following Commands: Follows one step commands consistently;Follows multi-step commands with increased time Safety/Judgement: Decreased awareness of safety;Decreased awareness of deficits Awareness: Emergent Problem Solving: Difficulty sequencing;Requires verbal cues;Requires tactile cues General Comments: Pt asked 6 times why she was in the hospital and how long she has been here.  Pt with almost no STM at this point.  Explained to her why she was here and why it was unsafe for her to return home and she agreed.        Exercises     Shoulder Instructions       General Comments Pt has reached somewhat of a plateau with functional tasks in this setting.      Pertinent Vitals/ Pain       Pain Assessment: No/denies pain  Home Living                                          Prior Functioning/Environment              Frequency  Min 2X/week        Progress Toward Goals  OT Goals(current goals can now be found in the care plan section)  Progress towards OT goals: Progressing toward goals  Acute Rehab OT Goals Patient Stated Goal: to go home OT Goal Formulation: With patient Time For Goal Achievement: 01/23/19 Potential to Achieve Goals: Good ADL Goals Pt Will Perform Lower Body Dressing: with modified independence;sit to/from stand Pt Will Transfer to Toilet: with  modified independence;ambulating;regular height toilet Pt Will Perform Toileting - Clothing Manipulation and hygiene: with modified independence;sit to/from stand Additional ADL Goal #1: Patient will complete medication management tasks with zero errors in 2/3 trials.  Plan Discharge plan remains appropriate    Co-evaluation                 AM-PAC OT "6 Clicks" Daily Activity     Outcome Measure   Help from another person eating meals?: None Help from another person taking care of personal grooming?: None Help from another person toileting, which includes using toliet, bedpan, or urinal?: A Little Help from another person bathing (including washing, rinsing, drying)?: A Little Help from another person to put on and taking off regular upper body clothing?: A Little Help from another person to put on and taking off regular lower body clothing?: A Little 6 Click Score: 20    End of Session Equipment Utilized During  Treatment: Rolling walker  OT Visit Diagnosis: Unsteadiness on feet (R26.81);Other abnormalities of gait and mobility (R26.89);Muscle weakness (generalized) (M62.81);Other symptoms and signs involving cognitive function   Activity Tolerance Patient tolerated treatment well   Patient Left in chair;with call bell/phone within reach   Nurse Communication Mobility status        Time: 1140-1200 OT Time Calculation (min): 20 min  Charges: OT General Charges $OT Visit: 1 Visit OT Treatments $Self Care/Home Management : 8-22 mins   Glenford Peers 01/16/2019, 12:09 PM

## 2019-01-16 NOTE — Progress Notes (Signed)
VAST consulted to place PIV access. Pt currently has no IV meds/fluids or studies ordered. Per best practice, no IV access will be placed at this time to allow for vein preservation and reduce the risk of infection from unneccessary needle sticks. Pt's nurse, [place] notified and verbalized understanding. If pt's condition changes and IV access is needed, unit RN to place new IVT consult.

## 2019-01-16 NOTE — Progress Notes (Signed)
Physical Therapy Treatment Patient Details Name: Marie Woods MRN: [id number] DOB: 1955-11-01 Today's Date: 01/16/2019    History of Present Illness Patient is a 63 year old female with medical history significant of asthma, hypertension, alcohol abuse who was brought in by EMS after neighbors called in stating that patient is confused and altered. Patient admitted Severe Sepsis with Acute metabolic encephalopathy secondary to UTI.     PT Comments    Pt was seen for mobility and progression of exercises.  Has a good tolerance for activity, but is quite confused and requires repeated answers to questions about her length of stay and reason for hosp.  Follow for acute therapy strengthening and balance training, focus on endurance and her safety.  Follow Up Recommendations  SNF;Supervision/Assistance - 24 hour     Equipment Recommendations  Rolling walker with 5" wheels    Recommendations for Other Services       Precautions / Restrictions Precautions Precautions: Fall Precaution Comments: mild confusion Restrictions Weight Bearing Restrictions: No    Mobility  Bed Mobility               General bed mobility comments: up in chair when PT arrived  Transfers Overall transfer level: Needs assistance Equipment used: None Transfers: Sit to/from Stand Sit to Stand: Min guard         General transfer comment: reminders for hand placement, sitting in unlocked chair when PT arrived  Ambulation/Gait Ambulation/Gait assistance: Min guard Gait Distance (Feet): 325 Feet Assistive device: Rolling walker (2 wheeled) Gait Pattern/deviations: Step-through pattern;Trunk flexed;Drifts right/left;Wide base of support Gait velocity: decreased   General Gait Details: flexed posture with ongoing poor foot clearance; furniture walks without RW. Cues to stay close to walker, even so tends to push RW far away from her especially when turning   Stairs             Wheelchair  Mobility    Modified Rankin (Stroke Patients Only)       Balance Overall balance assessment: Needs assistance Sitting-balance support: Feet supported Sitting balance-Leahy Scale: Good     Standing balance support: Bilateral upper extremity supported;During functional activity Standing balance-Leahy Scale: Poor                              Cognition Arousal/Alertness: Awake/alert Behavior During Therapy: Impulsive Overall Cognitive Status: No family/caregiver present to determine baseline cognitive functioning Area of Impairment: Awareness;Problem solving;Safety/judgement;Following commands;Memory;Attention;Orientation                 Orientation Level: Time;Situation Current Attention Level: Selective Memory: Decreased recall of precautions;Decreased short-term memory Following Commands: Follows one step commands inconsistently;Follows one step commands with increased time Safety/Judgement: Decreased awareness of safety;Decreased awareness of deficits Awareness: Intellectual Problem Solving: Slow processing;Requires verbal cues General Comments: Pt asked 6 times why she was in the hospital and how long she has been here.  Pt with almost no STM at this point.  Explained to her why she was here and why it was unsafe for her to return home and she agreed.      Exercises  ROM to ankles and hips with active movement, encouraged with sitting posture.    General Comments General comments (skin integrity, edema, etc.): has expressed interest in walking but needs to be assisted      Pertinent Vitals/Pain Pain Assessment: No/denies pain    Home Living  Prior Function            PT Goals (current goals can now be found in the care plan section) Acute Rehab PT Goals Patient Stated Goal: to go home Progress towards PT goals: Progressing toward goals    Frequency    Min 3X/week      PT Plan Current plan remains  appropriate    Co-evaluation              AM-PAC PT "6 Clicks" Mobility   Outcome Measure  Help needed turning from your back to your side while in a flat bed without using bedrails?: None Help needed moving from lying on your back to sitting on the side of a flat bed without using bedrails?: None Help needed moving to and from a bed to a chair (including a wheelchair)?: A Little Help needed standing up from a chair using your arms (e.g., wheelchair or bedside chair)?: A Little Help needed to walk in hospital room?: A Little Help needed climbing 3-5 steps with a railing? : A Little 6 Click Score: 20    End of Session Equipment Utilized During Treatment: Gait belt Activity Tolerance: Patient tolerated treatment well Patient left: in chair;with call bell/phone within reach Nurse Communication: Mobility status PT Visit Diagnosis: Other abnormalities of gait and mobility (R26.89);Muscle weakness (generalized) (M62.81);Difficulty in walking, not elsewhere classified (R26.2)     Time: 8592-9244 PT Time Calculation (min) (ACUTE ONLY): 32 min  Charges:  $Gait Training: 8-22 mins $Therapeutic Exercise: 8-22 mins                    Ramond Dial 01/16/2019, 5:12 PM   Mee Hives, PT MS Acute Rehab Dept. Number: [place] and [place]

## 2019-01-16 NOTE — Progress Notes (Signed)
PROGRESS NOTE    Marie Woods  WUJ:[id number] DOB: Jun 03, 1955 DOA: 12/13/2018 PCP: Ladell Pier, MD   Brief Narrative:  63 year old with history of asthma, alcohol abuse, essential hypertension brought to the hospital with complains of confusion and change in mental status.  Patient was found to have urinary tract infection with urine cultures growing multiple species which was treated with Rocephin.  MRI was suggestive of possible Wernicke's encephalopathy treated with IV thiamine and then transition to p.o. thiamine.  Physical therapy recommended skilled nursing facility placement.  Social worker working on Office manager & Plan:   Principal Problem:   AMS (altered mental status) Active Problems:   History of alcohol use   TOBACCO ABUSE   Essential hypertension  Sepsis secondary to polymicrobial urinary tract infection, resolved.  Was present on admission -Completed course of Rocephin  Acute metabolic encephalopathy likely from [place] encephalopathy, stable. Alcohol use -Persistent issue-lots of short-term memory issues.  CT the head negative for any acute finding.  MRI brain was suggestive of possible Wernicke's encephalopathy -Completed IV thiamine.  P.o. thiamine now. -Outpatient follow-up with neurology. -Not showing any signs of alcohol withdrawal. -Folic acid, multivitamin  Allergic reaction, resolved -Patient had allergic reaction yesterday days after lunch.  Appears to have resolved.  This was treated with IV Solu-Medrol, Benadryl and Pepcid  History of tobacco use -Nicotine patch  Generalized deconditioning -PT-SNF, social worker working on placement  Intermittent frontal headache, resolved -No focal neurologic deficit.  CT of the head and MRI brain has been negative.  Intermittent vertiginous symptoms treated with meclizine  Essential hypertension -Norvasc 5 mg daily.  Left lower extremity rash, concerns for fungal infection -On  ketoconazole.  Eucerin cream.  Insomnia -Trazodone nightly as needed  PT/OT evaluation performed.  SNF recommended.  SNF appropriate as the patient has received 3 days of hospital care (or the 3 days stay Has been made by the patient's insurance company) and is felt to need rehab services to restore this patient to their prior level of function to achieve safe transition back to home care.  This patient needs rehab services for at least 5 days/week and skilled nursing services daily to facilitate this transition.  Rehab is been requested as the most appropriate discharge option for this patient and is not felt to be custodial care as evidenced by prior independent level of functioning now requiring 24/7 supervision..     DVT prophylaxis: Lovenox Code Status: Full code Family Communication: None at bedside Disposition Plan: Difficult placement, social worker working on safe disposition  Consultants:   Curbside neurology by previous provider   Antimicrobials:   Completed treatment with Rocephin   Subjective: No complaints this morning.  Pleasantly confused.  She thinks she is going to work. Review of Systems Otherwise negative except as per HPI, including: General = no fevers, chills, dizziness, malaise, fatigue HEENT/EYES = negative for pain, redness, loss of vision, double vision, blurred vision, loss of hearing, sore throat, hoarseness, dysphagia Cardiovascular= negative for chest pain, palpitation, murmurs, lower extremity swelling Respiratory/lungs= negative for shortness of breath, cough, hemoptysis, wheezing, mucus production Gastrointestinal= negative for nausea, vomiting,, abdominal pain, melena, hematemesis Genitourinary= negative for Dysuria, Hematuria, Change in Urinary Frequency MSK = Negative for arthralgia, myalgias, Back Pain, Joint swelling  Neurology= Negative for headache, seizures, numbness, tingling  Psychiatry= Negative for anxiety, depression, suicidal and  homocidal ideation Allergy/Immunology= Medication/Food allergy as listed  Skin= Negative for Rash, lesions, ulcers, itching  Objective: Vitals:   01/15/19  9163 01/15/19 1532 01/15/19 2100 01/16/19 0746  BP: 111/86 127/80 115/79 (!) 107/93  Pulse: 85 90 93 89  Resp:      Temp: 98.4 F (36.9 C) 97.7 F (36.5 C) 98.1 F (36.7 C) 98.8 F (37.1 C)  TempSrc: Oral Oral Oral Oral  SpO2: 99% 100% 98% 97%  Weight:      Height:        Intake/Output Summary (Last 24 hours) at 01/16/2019 1235 Last data filed at 01/16/2019 1100 Gross per 24 hour  Intake 460 ml  Output --  Net 460 ml   Filed Weights   12/15/18 0101  Weight: 98.4 kg    Examination: Constitutional: Not in acute distress, poor dentition Respiratory: Clear to auscultation bilaterally Cardiovascular: Normal sinus rhythm, no rubs Abdomen: Nontender nondistended good bowel sounds Musculoskeletal: No edema noted Skin: No rashes seen Neurologic: CN 2-12 grossly intact.  And nonfocal Psychiatric: Poor judgment and insight.  Alert to name only.     Data Reviewed:   CBC: No results for input(s): WBC, NEUTROABS, HGB, HCT, MCV, PLT in the last 168 hours. Basic Metabolic Panel: Recent Labs  Lab 01/10/19 0311  CREATININE 0.61   GFR: Estimated Creatinine Clearance: 89 mL/min (by C-G formula based on SCr of 0.61 mg/dL). Liver Function Tests: No results for input(s): AST, ALT, ALKPHOS, BILITOT, PROT, ALBUMIN in the last 168 hours. No results for input(s): LIPASE, AMYLASE in the last 168 hours. No results for input(s): AMMONIA in the last 168 hours. Coagulation Profile: No results for input(s): INR, PROTIME in the last 168 hours. Cardiac Enzymes: No results for input(s): CKTOTAL, CKMB, CKMBINDEX, TROPONINI in the last 168 hours. BNP (last 3 results) No results for input(s): PROBNP in the last 8760 hours. HbA1C: No results for input(s): HGBA1C in the last 72 hours. CBG: No results for input(s): GLUCAP in the last  168 hours. Lipid Profile: No results for input(s): CHOL, HDL, LDLCALC, TRIG, CHOLHDL, LDLDIRECT in the last 72 hours. Thyroid Function Tests: No results for input(s): TSH, T4TOTAL, FREET4, T3FREE, THYROIDAB in the last 72 hours. Anemia Panel: No results for input(s): VITAMINB12, FOLATE, FERRITIN, TIBC, IRON, RETICCTPCT in the last 72 hours. Sepsis Labs: No results for input(s): PROCALCITON, LATICACIDVEN in the last 168 hours.  No results found for this or any previous visit (from the past 240 hour(s)).       Radiology Studies: No results found.      Scheduled Meds: . amLODipine  5 mg Oral Daily  . enoxaparin (LOVENOX) injection  40 mg Subcutaneous Q24H  . feeding supplement (ENSURE ENLIVE)  237 mL Oral BID BM  . folic acid  1 mg Oral Daily  . ketoconazole   Topical BID  . multivitamin with minerals  1 tablet Oral Daily  . nicotine  21 mg Transdermal Daily  . thiamine  100 mg Oral Daily   Continuous Infusions:    LOS: 34 days   Time spent= 15 mins    Landy Mace Joline Maxcy, MD [facility]  If 7PM-7AM, please contact night-coverage  01/16/2019, 12:35 PM

## 2019-01-17 LAB — CREATININE, SERUM
Creatinine, Ser: 0.73 mg/dL (ref 0.44–1.00)
GFR calc Af Amer: >60 mL/min (ref >60)
GFR calc non Af Amer: >60 mL/min (ref >60)

## 2019-01-17 NOTE — Progress Notes (Signed)
Nutrition Follow-up  DOCUMENTATION CODES:   Obesity unspecified  INTERVENTION:   - Continue MVI with minerals daily  -Ensure Enlive po BID, each supplement provides 350 kcal and 20 grams of protein  NUTRITION DIAGNOSIS:   Increased nutrient needs related to acute illness as evidenced by estimated needs.  Ongoing.  GOAL:   Patient will meet greater than or equal to 90% of their needs  Progressing.  MONITOR:   PO intake, Supplement acceptance, Labs, Weight trends  ASSESSMENT:   63 year old female who presented to the ED on 11/10 with AMS. PMH of asthma, EtOH abuse, HTN. Pt admitted with severe sepsis secondary to UTI. Pt found to have Wernicke's encephalopathy.  **RD working remotely**  Per MD note, pt continues to be medically stable for discharge. Awaiting placement.  Pt currently consuming 60% of meals and is drinking supplements that are ordered.   Admission weight: 217 lbs. No new weights since 11/12.   Labs reviewed. Medications: Folic acid tablet, Multivitamin with minerals daily, Thiamine tablet  Diet Order:   Diet Order            Diet Heart Room service appropriate? No; Fluid consistency: Thin  Diet effective now              EDUCATION NEEDS:   Not appropriate for education at this time  Skin:  Skin Assessment: Reviewed RN Assessment  Last BM:  12/13  Height:   Ht Readings from Last 1 Encounters:  12/15/18 5\' 7"  (1.702 m)    Weight:   Wt Readings from Last 1 Encounters:  12/15/18 98.4 kg    Ideal Body Weight:  61.4 kg  BMI:  Body mass index is 33.99 kg/m.  Estimated Nutritional Needs:   Kcal:  1900-2100  Protein:  95-110 grams  Fluid:  >/= 1.8 L  Clayton Bibles, MS, RD, LDN Inpatient Clinical Dietitian Pager: [phone] After Hours Pager: [phone]

## 2019-01-17 NOTE — Progress Notes (Signed)
PROGRESS NOTE    Marie Woods  KNL:[id number] DOB: January 16, 1956 DOA: 12/13/2018 PCP: Marcine Matar, MD   Brief Narrative:  63 year old with history of asthma, alcohol abuse, essential hypertension brought to the hospital with complains of confusion and change in mental status.  Patient was found to have urinary tract infection with urine cultures growing multiple species which was treated with Rocephin.  MRI was suggestive of possible Wernicke's encephalopathy treated with IV thiamine and then transition to p.o. thiamine.  Physical therapy recommended skilled nursing facility placement.  Social worker working on [place] & Plan:   Principal Problem:   AMS (altered mental status) Active Problems:   History of alcohol use   TOBACCO ABUSE   Essential hypertension  Sepsis secondary to polymicrobial urinary tract infection, resolved.  Was present on admission -Completed course of Rocephin  Acute metabolic encephalopathy likely from Warnicke encephalopathy, stable. Alcohol use -Continues to have short-term memory issues.  CT the head negative for any acute finding.  MRI brain was suggestive of possible Wernicke's encephalopathy -Completed IV thiamine.  P.o. thiamine now. -Outpatient follow-up with neurology. -Not showing any signs of alcohol withdrawal. -Folic acid, multivitamin  History of tobacco use -Nicotine patch  Generalized deconditioning -PT-SNF, social worker working on placement  Intermittent frontal headache, resolved -No focal neurologic deficit.  CT of the head and MRI brain has been negative.  Intermittent vertiginous symptoms treated with meclizine  Essential hypertension -Norvasc 5 mg daily.  Left lower extremity rash, concerns for fungal infection -On ketoconazole.  Eucerin cream.  Insomnia -Trazodone nightly as needed  PT/OT evaluation performed.  SNF recommended.  SNF appropriate as the patient has received 3 days of hospital care (or  the 3 days stay Has been made by the patient's insurance company) and is felt to need rehab services to restore this patient to their prior level of function to achieve safe transition back to home care.  This patient needs rehab services for at least 5 days/week and skilled nursing services daily to facilitate this transition.  Rehab is been requested as the most appropriate discharge option for this patient and is not felt to be custodial care as evidenced by prior independent level of functioning now requiring 24/7 supervision..     DVT prophylaxis: Lovenox Code Status: Full code Family Communication: None at bedside Disposition Plan: Difficult placement, social worker working on placement at skilled nursing facility  Consultants:   Curbside neurology by previous provider   Antimicrobials:   Completed treatment with Rocephin   Subjective: No complaints, no acute events overnight.  Pleasantly confused.  Review of Systems Otherwise negative except as per HPI, including: General = no fevers, chills, dizziness, malaise, fatigue HEENT/EYES = negative for pain, redness, loss of vision, double vision, blurred vision, loss of hearing, sore throat, hoarseness, dysphagia Cardiovascular= negative for chest pain, palpitation, murmurs, lower extremity swelling Respiratory/lungs= negative for shortness of breath, cough, hemoptysis, wheezing, mucus production Gastrointestinal= negative for nausea, vomiting,, abdominal pain, melena, hematemesis Genitourinary= negative for Dysuria, Hematuria, Change in Urinary Frequency MSK = Negative for arthralgia, myalgias, Back Pain, Joint swelling  Neurology= Negative for headache, seizures, numbness, tingling  Psychiatry= Negative for anxiety, depression, suicidal and homocidal ideation Allergy/Immunology= Medication/Food allergy as listed  Skin= Negative for Rash, lesions, ulcers, itching   Objective: Vitals:   01/16/19 1604 01/16/19 2300 01/17/19  0339 01/17/19 0749  BP: 113/81 103/87 119/87 99/66  Pulse: 93 89 93 89  Resp:    16  Temp: 98.1  F (36.7 C) 98 F (36.7 C) 97.8 F (36.6 C) 98.5 F (36.9 C)  TempSrc: Oral Oral Oral Oral  SpO2: 98% 98% 100% 99%  Weight:      Height:        Intake/Output Summary (Last 24 hours) at 01/17/2019 1404 Last data filed at 01/17/2019 0200 Gross per 24 hour  Intake 720 ml  Output --  Net 720 ml   Filed Weights   12/15/18 0101  Weight: 98.4 kg    Examination: Constitutional: Not in acute distress, poor dentition Respiratory: Clear to auscultation bilaterally Cardiovascular: Normal sinus rhythm, no rubs Abdomen: Nontender nondistended good bowel sounds Musculoskeletal: No edema noted Skin: No rashes seen Neurologic: CN 2-12 grossly intact.  And nonfocal Psychiatric: Poor judgment and insight     Data Reviewed:   CBC: No results for input(s): WBC, NEUTROABS, HGB, HCT, MCV, PLT in the last 168 hours. Basic Metabolic Panel: Recent Labs  Lab 01/17/19 0309  CREATININE 0.73   GFR: Estimated Creatinine Clearance: 89 mL/min (by C-G formula based on SCr of 0.73 mg/dL). Liver Function Tests: No results for input(s): AST, ALT, ALKPHOS, BILITOT, PROT, ALBUMIN in the last 168 hours. No results for input(s): LIPASE, AMYLASE in the last 168 hours. No results for input(s): AMMONIA in the last 168 hours. Coagulation Profile: No results for input(s): INR, PROTIME in the last 168 hours. Cardiac Enzymes: No results for input(s): CKTOTAL, CKMB, CKMBINDEX, TROPONINI in the last 168 hours. BNP (last 3 results) No results for input(s): PROBNP in the last 8760 hours. HbA1C: No results for input(s): HGBA1C in the last 72 hours. CBG: No results for input(s): GLUCAP in the last 168 hours. Lipid Profile: No results for input(s): CHOL, HDL, LDLCALC, TRIG, CHOLHDL, LDLDIRECT in the last 72 hours. Thyroid Function Tests: No results for input(s): TSH, T4TOTAL, FREET4, T3FREE, THYROIDAB in the  last 72 hours. Anemia Panel: No results for input(s): VITAMINB12, FOLATE, FERRITIN, TIBC, IRON, RETICCTPCT in the last 72 hours. Sepsis Labs: No results for input(s): PROCALCITON, LATICACIDVEN in the last 168 hours.  No results found for this or any previous visit (from the past 240 hour(s)).       Radiology Studies: No results found.      Scheduled Meds: . amLODipine  5 mg Oral Daily  . enoxaparin (LOVENOX) injection  40 mg Subcutaneous Q24H  . feeding supplement (ENSURE ENLIVE)  237 mL Oral BID BM  . folic acid  1 mg Oral Daily  . ketoconazole   Topical BID  . multivitamin with minerals  1 tablet Oral Daily  . nicotine  21 mg Transdermal Daily  . thiamine  100 mg Oral Daily   Continuous Infusions:    LOS: 35 days   Time spent= 15 mins    Taevin Mcferran Arsenio Loader, MD [facility]  If 7PM-7AM, please contact night-coverage  01/17/2019, 2:04 PM

## 2019-01-18 NOTE — TOC Progression Note (Signed)
Transition of Care [facility]) - Progression Note    Patient Details  Name: Marie Woods MRN: [id number] Date of Birth: [birth year]  Transition of Care [facility]) CM/SW Katie, LCSW Phone Number: 01/18/2019, 8:57 AM  Clinical Narrative:    Patient continues to have no SNF bed offers. Patient remains on [facility] waitlist.    Expected Discharge Plan: [place] Barriers to Discharge: No SNF bed, SNF Pending payor source - LOG  Expected Discharge Plan and Services Expected Discharge Plan: [place] In-house Referral: Clinical Social Work Discharge Planning Services: NA Post Acute Care Choice: [place] Living arrangements for the past 2 months: Apartment                 DME Arranged: N/A DME Agency: NA                   Social Determinants of Health (SDOH) Interventions    Readmission Risk Interventions No flowsheet data found.

## 2019-01-18 NOTE — Progress Notes (Signed)
[name], female   DOB: Apr 21, 1955, 63 y.o.   MRN: [id number]  PROGRESS NOTE    [name]  EHM:[id number] DOB: Feb 03, 1955 DOA: 12/13/2018 PCP: Ladell Pier, MD   Brief Narrative:  63 year old female with history of asthma, hypertension, alcohol abuse was brought to the hospital confused and altered.  She was found to have UTI; urine culture showed multiple species.  MRI brain was suggestive of possible Wernicke's encephalopathy.  Treated with thiamine.  PT recommended SNF placement.  Assessment & Plan:   Severe sepsis: Present on admission UTI -Urine culture showed multiple species.  Repeat culture showed less than 10,000 colonies.  Was initially treated with IV Rocephin and fluids.  Subsequently antibiotics have been discontinued. -Currently afebrile and hemodynamically stable.  Sepsis has resolved.  Chest x-ray was negative for infection.  Acute metabolic encephalopathy Possible Wernicke's encephalopathy -CT of the head showed mild atrophic changes without acute intracranial abnormality -MRI of the brain: Symmetric diffusion weighted and T2/FLAIR hyperintense signal abnormality within the medial thalami.  Findings favor sequelae of Wernicke's encephalopathy There was question of patient having alcohol abuse however alcohol level less than 10.  Urine drug screen negative -Completed IV thiamine.  Currently on oral thiamine now.  -Continue folic acid, multivitamin. -Outpatient follow-up with neurology. -Mental status slowly improving.  Difficult to keep patient focused and needing to be reoriented frequently.  Essential hypertension -Continue Norvasc.  History of alcohol abuse -Continue folate, thiamine and multivitamin.  Continue CIWA protocol  History of tobacco abuse -Continue nicotine patch  Left lower extremity rash, concerns for fungal infection -On ketoconazole.  Eucerin cream.  Generalized deconditioning Bedbug -Bedbug found on patient on  presentation. -PT/OT recommending SNF.  Social worker following. -Feel that patient does need supervision as she appears to have short-term memory impairment.  We discuss the reason for her hospitalization on a daily basis but she does not remember the next day.  DVT prophylaxis: Lovenox Code Status: Full Family Communication: None at bedside Disposition Plan: SNF once bed is available  Consultants: Neurology via phone  Procedures: None  Antimicrobials:  Anti-infectives (From admission, onward)   Start     Dose/Rate Route Frequency Ordered Stop   12/14/18 0100  cefTRIAXone (ROCEPHIN) 1 g in sodium chloride 0.9 % 100 mL IVPB  Status:  Discontinued     1 g 200 mL/hr over 30 Minutes Intravenous Every 24 hours 12/14/18 0010 12/17/18 0948       Subjective: Patient seen and examined at bedside.  Very poor historian.  Pleasantly confused.  No overnight fever or vomiting reported  objective: Vitals:   01/17/19 0749 01/17/19 1615 01/17/19 2305 01/18/19 0801  BP: 99/66 135/81 131/90 120/90  Pulse: 89 84 82 85  Resp: 16 16 20 16   Temp: 98.5 F (36.9 C) 98.3 F (36.8 C) 98.5 F (36.9 C) 98.6 F (37 C)  TempSrc: Oral Oral  Oral  SpO2: 99% 100% 98% 100%  Weight:      Height:       No intake or output data in the 24 hours ending 01/18/19 1125 Filed Weights   12/15/18 0101  Weight: 98.4 kg    Examination:  General exam: No acute distress distress.  Looks chronically ill.  Looks older than stated age.  Pleasantly confused.  Awake. Respiratory system: Bilateral decreased breath sounds at bases with scattered crackles  cardiovascular system: Rate controlled, S1-S2 heard Gastrointestinal system: Abdomen is nondistended, soft and nontender. Normal bowel sounds heard. Extremities: No  cyanosis, edema   Data Reviewed: I have personally reviewed following labs and imaging studies  CBC: No results for input(s): WBC, NEUTROABS, HGB, HCT, MCV, PLT in the last 168 hours. Basic  Metabolic Panel: Recent Labs  Lab 01/17/19 0309  CREATININE 0.73   GFR: Estimated Creatinine Clearance: 89 mL/min (by C-G formula based on SCr of 0.73 mg/dL). Liver Function Tests: No results for input(s): AST, ALT, ALKPHOS, BILITOT, PROT, ALBUMIN in the last 168 hours. No results for input(s): LIPASE, AMYLASE in the last 168 hours. No results for input(s): AMMONIA in the last 168 hours. Coagulation Profile: No results for input(s): INR, PROTIME in the last 168 hours. Cardiac Enzymes: No results for input(s): CKTOTAL, CKMB, CKMBINDEX, TROPONINI in the last 168 hours. BNP (last 3 results) No results for input(s): PROBNP in the last 8760 hours. HbA1C: No results for input(s): HGBA1C in the last 72 hours. CBG: No results for input(s): GLUCAP in the last 168 hours. Lipid Profile: No results for input(s): CHOL, HDL, LDLCALC, TRIG, CHOLHDL, LDLDIRECT in the last 72 hours. Thyroid Function Tests: No results for input(s): TSH, T4TOTAL, FREET4, T3FREE, THYROIDAB in the last 72 hours. Anemia Panel: No results for input(s): VITAMINB12, FOLATE, FERRITIN, TIBC, IRON, RETICCTPCT in the last 72 hours. Sepsis Labs: No results for input(s): PROCALCITON, LATICACIDVEN in the last 168 hours.  No results found for this or any previous visit (from the past 240 hour(s)).       Radiology Studies: No results found.      Scheduled Meds: . amLODipine  5 mg Oral Daily  . enoxaparin (LOVENOX) injection  40 mg Subcutaneous Q24H  . feeding supplement (ENSURE ENLIVE)  237 mL Oral BID BM  . folic acid  1 mg Oral Daily  . ketoconazole   Topical BID  . multivitamin with minerals  1 tablet Oral Daily  . nicotine  21 mg Transdermal Daily  . thiamine  100 mg Oral Daily   Continuous Infusions:         Glade Lloyd, MD [facility] 01/18/2019, 11:25 AM

## 2019-01-19 NOTE — Progress Notes (Signed)
Physical Therapy Treatment Patient Details Name: Marie Woods MRN: [id number] DOB: [birth year] Today's Date: 01/19/2019    History of Present Illness Patient is a 64 year old female with medical history significant of asthma, hypertension, alcohol abuse who was brought in by EMS after neighbors called in stating that patient is confused and altered. Patient admitted Severe Sepsis with Acute metabolic encephalopathy secondary to UTI.     PT Comments    Patient received sitting at EOB, very pleasant and willing to work with PT, seems more aware of her STM deficits and confusion and asked lots of questions about why/how long she had been here; participated in cognitive remediation tasks during gait and needed Mod cues to come up with correct answers to A&O questions. Tolerated progression of gait with RW today, continues to display deviations as below. Also required mod cues for dressing prior to going into hallway as she had put her gown on backwards, unable to recognize what was wrong without cues from PT. She was left sitting at EOB with all needs met, bed alarm active. Continue to recommend SNF and 24/7A moving forward.     Follow Up Recommendations  SNF;Supervision/Assistance - 24 hour     Equipment Recommendations  Rolling walker with 5" wheels    Recommendations for Other Services       Precautions / Restrictions Precautions Precautions: Fall Precaution Comments: mild confusion Restrictions Weight Bearing Restrictions: No    Mobility  Bed Mobility               General bed mobility comments: at EOB upon arrival  Transfers Overall transfer level: Needs assistance Equipment used: None Transfers: Sit to/from Stand Sit to Stand: Supervision         General transfer comment: S for safety, Mod VC provided to maintain safe envirionment and prevent impulsive movements  Ambulation/Gait Ambulation/Gait assistance: Supervision Gait Distance (Feet): 400 Feet Assistive  device: Rolling walker (2 wheeled) Gait Pattern/deviations: Step-through pattern;Trunk flexed;Drifts right/left;Wide base of support Gait velocity: decreased   General Gait Details: flexed posture, wide BOS and ongoing difficulty with RW management especially on turns; remains internally distracted, less externally distracted today   Stairs             Wheelchair Mobility    Modified Rankin (Stroke Patients Only)       Balance Overall balance assessment: Needs assistance Sitting-balance support: Feet supported Sitting balance-Leahy Scale: Normal     Standing balance support: Bilateral upper extremity supported;During functional activity Standing balance-Leahy Scale: Fair Standing balance comment: benefits from BUE support on RW                            Cognition Arousal/Alertness: Awake/alert Behavior During Therapy: Impulsive Overall Cognitive Status: No family/caregiver present to determine baseline cognitive functioning Area of Impairment: Awareness;Problem solving;Safety/judgement;Following commands;Memory;Attention;Orientation                 Orientation Level: Disoriented to;Situation;Time Current Attention Level: Selective Memory: Decreased recall of precautions;Decreased short-term memory Following Commands: Follows one step commands consistently;Follows one step commands with increased time;Follows multi-step commands inconsistently Safety/Judgement: Decreased awareness of safety;Decreased awareness of deficits Awareness: Emergent Problem Solving: Slow processing;Requires verbal cues General Comments: ongoing STM deficits, asked lots of questions about why and how long she was her, seems more aware of safety issues due to her memory and is agreeable that its not safe for her to live alone  Exercises      General Comments        Pertinent Vitals/Pain Pain Assessment: No/denies pain Pain Score: 0-No pain Pain Intervention(s):  Limited activity within patient's tolerance;Monitored during session    Home Living                      Prior Function            PT Goals (current goals can now be found in the care plan section) Acute Rehab PT Goals Patient Stated Goal: to go home PT Goal Formulation: With patient Time For Goal Achievement: 01/26/19 Potential to Achieve Goals: Fair Progress towards PT goals: Progressing toward goals    Frequency    Min 3X/week      PT Plan Current plan remains appropriate    Co-evaluation              AM-PAC PT "6 Clicks" Mobility   Outcome Measure  Help needed turning from your back to your side while in a flat bed without using bedrails?: None Help needed moving from lying on your back to sitting on the side of a flat bed without using bedrails?: None Help needed moving to and from a bed to a chair (including a wheelchair)?: A Little Help needed standing up from a chair using your arms (e.g., wheelchair or bedside chair)?: A Little Help needed to walk in hospital room?: A Little Help needed climbing 3-5 steps with a railing? : A Little 6 Click Score: 20    End of Session   Activity Tolerance: Patient tolerated treatment well Patient left: in bed;with call bell/phone within reach;with bed alarm set(sitting at EOB)   PT Visit Diagnosis: Other abnormalities of gait and mobility (R26.89);Muscle weakness (generalized) (M62.81);Difficulty in walking, not elsewhere classified (R26.2)     Time: 8563-1497 PT Time Calculation (min) (ACUTE ONLY): 17 min  Charges:  $Gait Training: 8-22 mins                     Windell Norfolk, DPT, PN1   Supplemental Physical Therapist [place]    Pager [phone] Acute Rehab Office [phone]

## 2019-01-19 NOTE — Progress Notes (Signed)
Occupational Therapy Treatment Patient Details Name: Marie Woods MRN: [id number] DOB: 30-Jul-1955 Today's Date: 01/19/2019    History of present illness Patient is a 63 year old female with medical history significant of asthma, hypertension, alcohol abuse who was brought in by EMS after neighbors called in stating that patient is confused and altered. Patient admitted Severe Sepsis with Acute metabolic encephalopathy secondary to UTI.    OT comments  Pt making steady progress towards OT goals this session. Pt limited by [employer] memory deficits and balance impairments. Pt requested to walk in hallway at start of session. Instructed pt in multitasking activity where pt was asked to recall colors/ animals during ambulation. Pt required cues to not repeat colors and was only able to name 3 animals. Pt easily distracted during functional mobility with pt requiring cues to redirect back to task. Pt complete higher level IADL tasks to challenge dynamic balance for safe transfers. Pt able to complete task with no AD and close min guard. DC plan currently remains appropriate, will continue to follow acutely per POC.    Follow Up Recommendations  SNF;Supervision/Assistance - 24 hour    Equipment Recommendations  Other (comment)(defer to next venue of care)    Recommendations for Other Services      Precautions / Restrictions Precautions Precautions: Fall Precaution Comments: confusion, STM deficits Restrictions Weight Bearing Restrictions: No       Mobility Bed Mobility Overal bed mobility: Modified Independent Bed Mobility: Supine to Sit     Supine to sit: Modified independent (Device/Increase time);HOB elevated     General bed mobility comments: use of bed rails wtih HOB elevated  Transfers Overall transfer level: Needs assistance Equipment used: None Transfers: Sit to/from Stand Sit to Stand: Min assist;Min guard         General transfer comment: initial min A to steady upon  initial stand, pt requesting to use RW d/t feeling "unsteady"    Balance Overall balance assessment: Needs assistance Sitting-balance support: Feet supported Sitting balance-Leahy Scale: Normal     Standing balance support: Bilateral upper extremity supported;During functional activity Standing balance-Leahy Scale: Fair Standing balance comment: benefits from BUE support on RW                           ADL either performed or assessed with clinical judgement   ADL Overall ADL's : Needs assistance/impaired                     Lower Body Dressing: Min guard;Sit to/from stand;Supervision/safety Lower Body Dressing Details (indicate cue type and reason): able to pull up socks from EOB and also in standing with close min guard Toilet Transfer: Min guard;Ambulation;RW Toilet Transfer Details (indicate cue type and reason): simulated via functional mobility, minguard for safety but managing RW better this session compared to last session         Functional mobility during ADLs: Min guard;Rolling walker;Supervision/safety General ADL Comments: session focus on functional mobility/ transfers and higher level IADL tasks     Vision Baseline Vision/History: Wears glasses Wears Glasses: Reading only Vision Assessment?: No apparent visual deficits   Perception     Praxis      Cognition Arousal/Alertness: Awake/alert Behavior During Therapy: WFL for tasks assessed/performed Overall Cognitive Status: No family/caregiver present to determine baseline cognitive functioning Area of Impairment: Awareness;Problem solving;Safety/judgement;Following commands;Memory;Attention;Orientation                 Orientation Level:  Disoriented to;Time;Situation Current Attention Level: Selective Memory: Decreased recall of precautions;Decreased short-term memory Following Commands: Follows one step commands consistently;Follows one step commands with increased time;Follows  multi-step commands inconsistently Safety/Judgement: Decreased awareness of safety;Decreased awareness of deficits Awareness: Emergent Problem Solving: Slow processing;Requires verbal cues General Comments: pt requried cues to redirect back to task throughout session as pt easily distracted.noted to pass room 2x before locating. unable to name more than 3 animals during multitasking activity. Pt asking lots of questions about PLOF and why she is in the hospital.        Exercises     Shoulder Instructions       General Comments provided education on STM compensatory strategies such as keeping a notebook to assist with memory    Pertinent Vitals/ Pain       Pain Assessment: No/denies pain  Home Living                                          Prior Functioning/Environment              Frequency  Min 2X/week        Progress Toward Goals  OT Goals(current goals can now be found in the care plan section)  Progress towards OT goals: Progressing toward goals  Acute Rehab OT Goals Patient Stated Goal: to go home OT Goal Formulation: With patient Time For Goal Achievement: 01/23/19 Potential to Achieve Goals: Good  Plan Discharge plan remains appropriate    Co-evaluation                 AM-PAC OT "6 Clicks" Daily Activity     Outcome Measure   Help from another person eating meals?: None Help from another person taking care of personal grooming?: None Help from another person toileting, which includes using toliet, bedpan, or urinal?: A Little Help from another person bathing (including washing, rinsing, drying)?: A Little Help from another person to put on and taking off regular upper body clothing?: A Little Help from another person to put on and taking off regular lower body clothing?: A Little 6 Click Score: 20    End of Session Equipment Utilized During Treatment: Rolling walker;Gait belt  OT Visit Diagnosis: Unsteadiness on feet  (R26.81);Other abnormalities of gait and mobility (R26.89);Muscle weakness (generalized) (M62.81);Other symptoms and signs involving cognitive function   Activity Tolerance Patient tolerated treatment well   Patient Left with call bell/phone within reach   Nurse Communication Mobility status;Other (comment)(in chair, continues to ask questions about why she is here)        Time: 5643-3295 OT Time Calculation (min): 23 min  Charges: OT General Charges $OT Visit: 1 Visit OT Treatments $Self Care/Home Management : 23-37 mins  Lanier Clam., COTA/L Acute Rehabilitation Services [phone] [place] 01/19/2019, 4:13 PM

## 2019-01-19 NOTE — Progress Notes (Signed)
[name], female   DOB: [birth year], 63 y.o.   MRN: [id number]  PROGRESS NOTE    Marie Woods  KXF:[id number] DOB: Nov 22, 1955 DOA: 12/13/2018 PCP: Marcine Matar, MD   Brief Narrative:  63 year old female with history of asthma, hypertension, alcohol abuse was brought to the hospital confused and altered.  She was found to have UTI; urine culture showed multiple species.  MRI brain was suggestive of possible Wernicke's encephalopathy.  Treated with thiamine.  PT recommended SNF placement.  Assessment & Plan:   Severe sepsis: Present on admission UTI -Urine culture showed multiple species.  Repeat culture showed less than 10,000 colonies.  Was initially treated with IV Rocephin and fluids.  Subsequently antibiotics have been discontinued. -Currently afebrile and hemodynamically stable.  Sepsis has resolved.  Chest x-ray was negative for infection.  Acute metabolic encephalopathy Possible Wernicke's encephalopathy -CT of the head showed mild atrophic changes without acute intracranial abnormality -MRI of the brain: Symmetric diffusion weighted and T2/FLAIR hyperintense signal abnormality within the medial thalami.  Findings favor sequelae of Wernicke's encephalopathy There was question of patient having alcohol abuse however alcohol level less than 10.  Urine drug screen negative -Completed IV thiamine.  Currently on oral thiamine now.  -Continue folic acid, multivitamin. -Outpatient follow-up with neurology. -Mental status still fluctuating.  Essential hypertension -Continue Norvasc.  History of alcohol abuse -Continue folate, thiamine and multivitamin.  Continue CIWA protocol  History of tobacco abuse -Continue nicotine patch  Left lower extremity rash, concerns for fungal infection -On ketoconazole.  Eucerin cream.  Generalized deconditioning Bedbug -Bedbug found on patient on presentation. -PT/OT recommending SNF.  Social worker following. -Feel that  patient does need supervision as she appears to have short-term memory impairment.  We discuss the reason for her hospitalization on a daily basis but she does not remember the next day.  DVT prophylaxis: Lovenox Code Status: Full Family Communication: None at bedside Disposition Plan: SNF once bed is available  Consultants: Neurology via phone  Procedures: None  Antimicrobials:  Anti-infectives (From admission, onward)   Start     Dose/Rate Route Frequency Ordered Stop   12/14/18 0100  cefTRIAXone (ROCEPHIN) 1 g in sodium chloride 0.9 % 100 mL IVPB  Status:  Discontinued     1 g 200 mL/hr over 30 Minutes Intravenous Every 24 hours 12/14/18 0010 12/17/18 0948       Subjective: Patient seen and examined at bedside.  Very poor historian.  Still intermittently pleasantly confused.  No fever, vomiting or worsening shortness of breath reported.  objective: Vitals:   01/17/19 2305 01/18/19 0801 01/18/19 1528 01/18/19 2309  BP: 131/90 120/90 113/76 103/76  Pulse: 82 85 87 72  Resp: 20 16 16 20   Temp: 98.5 F (36.9 C) 98.6 F (37 C) 98.4 F (36.9 C) 98.7 F (37.1 C)  TempSrc:  Oral Oral   SpO2: 98% 100% 100% 98%  Weight:      Height:       No intake or output data in the 24 hours ending 01/19/19 0809 Filed Weights   12/15/18 0101  Weight: 98.4 kg    Examination:  General exam: No distress.  Looks chronically ill.  Looks older than stated age.  Awake but pleasantly confused. Respiratory system: Bilateral decreased breath sounds at bases   cardiovascular system: Rate controlled, S1-S2 heard Gastrointestinal system: Abdomen is nondistended, soft and nontender. Normal bowel sounds heard. Extremities: No cyanosis, edema   Data Reviewed: I have personally reviewed following  labs and imaging studies  CBC: No results for input(s): WBC, NEUTROABS, HGB, HCT, MCV, PLT in the last 168 hours. Basic Metabolic Panel: Recent Labs  Lab 01/17/19 0309  CREATININE 0.73   GFR:  Estimated Creatinine Clearance: 89 mL/min (by C-G formula based on SCr of 0.73 mg/dL). Liver Function Tests: No results for input(s): AST, ALT, ALKPHOS, BILITOT, PROT, ALBUMIN in the last 168 hours. No results for input(s): LIPASE, AMYLASE in the last 168 hours. No results for input(s): AMMONIA in the last 168 hours. Coagulation Profile: No results for input(s): INR, PROTIME in the last 168 hours. Cardiac Enzymes: No results for input(s): CKTOTAL, CKMB, CKMBINDEX, TROPONINI in the last 168 hours. BNP (last 3 results) No results for input(s): PROBNP in the last 8760 hours. HbA1C: No results for input(s): HGBA1C in the last 72 hours. CBG: No results for input(s): GLUCAP in the last 168 hours. Lipid Profile: No results for input(s): CHOL, HDL, LDLCALC, TRIG, CHOLHDL, LDLDIRECT in the last 72 hours. Thyroid Function Tests: No results for input(s): TSH, T4TOTAL, FREET4, T3FREE, THYROIDAB in the last 72 hours. Anemia Panel: No results for input(s): VITAMINB12, FOLATE, FERRITIN, TIBC, IRON, RETICCTPCT in the last 72 hours. Sepsis Labs: No results for input(s): PROCALCITON, LATICACIDVEN in the last 168 hours.  No results found for this or any previous visit (from the past 240 hour(s)).       Radiology Studies: No results found.      Scheduled Meds: . amLODipine  5 mg Oral Daily  . enoxaparin (LOVENOX) injection  40 mg Subcutaneous Q24H  . feeding supplement (ENSURE ENLIVE)  237 mL Oral BID BM  . folic acid  1 mg Oral Daily  . ketoconazole   Topical BID  . multivitamin with minerals  1 tablet Oral Daily  . nicotine  21 mg Transdermal Daily  . thiamine  100 mg Oral Daily   Continuous Infusions:         Aline August, MD [facility] 01/19/2019, 8:09 AM

## 2019-01-20 MED ORDER — LORAZEPAM 2 MG/ML IJ SOLN
1.0000 mg | Freq: Once | INTRAMUSCULAR | Status: AC
  Administered 2019-01-20: 1 mg via INTRAMUSCULAR
  Filled 2019-01-20: qty 1

## 2019-01-20 MED ORDER — HALOPERIDOL LACTATE 5 MG/ML IJ SOLN
5.0000 mg | Freq: Once | INTRAMUSCULAR | Status: AC | PRN
  Administered 2019-02-03: 5 mg via INTRAMUSCULAR
  Filled 2019-01-20: qty 1

## 2019-01-20 NOTE — Progress Notes (Signed)
[name], female   DOB: [birth year], 63 y.o.   MRN: [id number]  PROGRESS NOTE    TARHONDA HOLLENBERG  JIR:[id number] DOB: December 13, 1955 DOA: 12/13/2018 PCP: Ladell Pier, MD   Brief Narrative:  63 year old female with history of asthma, hypertension, alcohol abuse was brought to the hospital confused and altered.  She was found to have UTI; urine culture showed multiple species.  MRI brain was suggestive of possible Wernicke's encephalopathy.  Treated with thiamine.  PT recommended SNF placement.  Assessment & Plan:   Severe sepsis: Present on admission UTI -Urine culture showed multiple species.  Repeat culture showed less than 10,000 colonies.  Was initially treated with IV Rocephin and fluids.  Subsequently antibiotics have been discontinued. -Currently afebrile and hemodynamically stable.  Sepsis has resolved.  Chest x-ray was negative for infection.  Acute metabolic encephalopathy Possible Wernicke's encephalopathy -CT of the head showed mild atrophic changes without acute intracranial abnormality -MRI of the brain: Symmetric diffusion weighted and T2/FLAIR hyperintense signal abnormality within the medial thalami.  Findings favor sequelae of Wernicke's encephalopathy There was question of patient having alcohol abuse however alcohol level less than 10.  Urine drug screen negative -Completed IV thiamine.  Currently on oral thiamine now.  -Continue folic acid, multivitamin. -Outpatient follow-up with neurology. -Mental status still fluctuating.  Essential hypertension -Continue Norvasc.  History of alcohol abuse -Continue folate, thiamine and multivitamin.  Continue CIWA protocol  History of tobacco abuse -Continue nicotine patch  Left lower extremity rash, concerns for fungal infection -On ketoconazole.  Eucerin cream.  Generalized deconditioning Bedbug -Bedbug found on patient on presentation. -PT/OT recommending SNF.  Social worker following. -Feel that  patient does need supervision as she appears to have short-term memory impairment.  We discuss the reason for her hospitalization on a daily basis but she does not remember the next day.  DVT prophylaxis: Lovenox Code Status: Full Family Communication: None at bedside Disposition Plan: SNF once bed is available  Consultants: Neurology via phone  Procedures: None  Antimicrobials:  Anti-infectives (From admission, onward)   Start     Dose/Rate Route Frequency Ordered Stop   12/14/18 0100  cefTRIAXone (ROCEPHIN) 1 g in sodium chloride 0.9 % 100 mL IVPB  Status:  Discontinued     1 g 200 mL/hr over 30 Minutes Intravenous Every 24 hours 12/14/18 0010 12/17/18 0948       Subjective: Patient seen and examined at bedside.  Poor historian.  Remains intermittently pleasantly confused.  No vomiting or fever or worsening shortness of breath reported.   Objective: Vitals:   01/19/19 0810 01/19/19 1641 01/19/19 2309 01/20/19 0742  BP: 123/89 123/74 (!) 126/93 98/66  Pulse: 81 100 88 78  Resp: 15 17 16    Temp: 98.6 F (37 C) 98.6 F (37 C) (!) 97.5 F (36.4 C) 98 F (36.7 C)  TempSrc: Oral Oral Oral Oral  SpO2: 100% 100% 100% 97%  Weight:      Height:        Intake/Output Summary (Last 24 hours) at 01/20/2019 0759 Last data filed at 01/19/2019 1826 Gross per 24 hour  Intake 960 ml  Output --  Net 960 ml   Filed Weights   12/15/18 0101  Weight: 98.4 kg    Examination:  General exam: No acute distress.  Looks chronically ill.  Looks older than stated age.  Awake but still pleasantly confused. Respiratory system: Bilateral decreased breath sounds at bases   cardiovascular system: Rate controlled, S1-S2 heard  Gastrointestinal system: Abdomen is nondistended, soft and nontender. Normal bowel sounds heard. Extremities: No cyanosis, edema   Data Reviewed: I have personally reviewed following labs and imaging studies  CBC: No results for input(s): WBC, NEUTROABS, HGB, HCT,  MCV, PLT in the last 168 hours. Basic Metabolic Panel: Recent Labs  Lab 01/17/19 0309  CREATININE 0.73   GFR: Estimated Creatinine Clearance: 89 mL/min (by C-G formula based on SCr of 0.73 mg/dL). Liver Function Tests: No results for input(s): AST, ALT, ALKPHOS, BILITOT, PROT, ALBUMIN in the last 168 hours. No results for input(s): LIPASE, AMYLASE in the last 168 hours. No results for input(s): AMMONIA in the last 168 hours. Coagulation Profile: No results for input(s): INR, PROTIME in the last 168 hours. Cardiac Enzymes: No results for input(s): CKTOTAL, CKMB, CKMBINDEX, TROPONINI in the last 168 hours. BNP (last 3 results) No results for input(s): PROBNP in the last 8760 hours. HbA1C: No results for input(s): HGBA1C in the last 72 hours. CBG: No results for input(s): GLUCAP in the last 168 hours. Lipid Profile: No results for input(s): CHOL, HDL, LDLCALC, TRIG, CHOLHDL, LDLDIRECT in the last 72 hours. Thyroid Function Tests: No results for input(s): TSH, T4TOTAL, FREET4, T3FREE, THYROIDAB in the last 72 hours. Anemia Panel: No results for input(s): VITAMINB12, FOLATE, FERRITIN, TIBC, IRON, RETICCTPCT in the last 72 hours. Sepsis Labs: No results for input(s): PROCALCITON, LATICACIDVEN in the last 168 hours.  No results found for this or any previous visit (from the past 240 hour(s)).       Radiology Studies: No results found.      Scheduled Meds: . amLODipine  5 mg Oral Daily  . enoxaparin (LOVENOX) injection  40 mg Subcutaneous Q24H  . feeding supplement (ENSURE ENLIVE)  237 mL Oral BID BM  . folic acid  1 mg Oral Daily  . ketoconazole   Topical BID  . multivitamin with minerals  1 tablet Oral Daily  . nicotine  21 mg Transdermal Daily  . thiamine  100 mg Oral Daily   Continuous Infusions:         Glade Lloyd, MD [facility] 01/20/2019, 7:59 AM

## 2019-01-20 NOTE — Progress Notes (Signed)
Found patient walking in the hall on 2W unit with RW without supervision or a mask. Provided a mask and assisted her back to her room. Staff at nurses station aware.   Windell Norfolk, DPT, PN1   Supplemental Physical Therapist [facility]    Pager [phone] Acute Rehab Office [phone]

## 2019-01-20 NOTE — Progress Notes (Signed)
Physical Therapy Treatment Patient Details Name: Marie Woods MRN: [id number] DOB: July 07, 1955 Today's Date: 01/20/2019    History of Present Illness Patient is a 63 year old female with medical history significant of asthma, hypertension, alcohol abuse who was brought in by EMS after neighbors called in stating that patient is confused and altered. Patient admitted Severe Sepsis with Acute metabolic encephalopathy secondary to UTI.     PT Comments    Patient received up in chair, pleasant and willing to participate in PT. Cognition continues to fluctuate and she is significantly less oriented than in yesterday's PT session. Able to perform functional transfers with S and no device, tolerated gait training with cues for safety with RW 415f with S as well. Continued to work on cognitive remediation tasks during gait with patient requiring Mod-Max cues and leading questions to determine what facility/city she is in, what month/year it is, why and how long she is here, who the president-elect is, and where her room is on the unit. Asked lots of repeated questions, clearly has poor retention of information. Was able to use room numbers to correctly find her room when given the number, however. Encouraged her to use compensatory strategies like keeping notes, also gave her simple exercise of trying to remember what date it is every day. She was left up in the chair with all needs met this morning.    Follow Up Recommendations  SNF;Supervision/Assistance - 24 hour     Equipment Recommendations  Rolling walker with 5" wheels    Recommendations for Other Services       Precautions / Restrictions Precautions Precautions: Fall Precaution Comments: confusion, STM deficits Restrictions Weight Bearing Restrictions: No    Mobility  Bed Mobility               General bed mobility comments: OOB in chair  Transfers Overall transfer level: Needs assistance Equipment used: None Transfers:  Sit to/from Stand Sit to Stand: Supervision         General transfer comment: S for safety and hand placement, wide BOS and able to reach dynamically, change out gowns in standing  Ambulation/Gait Ambulation/Gait assistance: Supervision Gait Distance (Feet): 400 Feet Assistive device: Rolling walker (2 wheeled) Gait Pattern/deviations: Step-through pattern;Trunk flexed;Drifts right/left;Wide base of support Gait velocity: decreased   General Gait Details: flexed posture, wide BOS and ongoing difficulty with RW management especially on turns; continues to have poor safety awareness and at one point said "watch this!" and made abrupt pivot with RW. Easily distracted by environment   Stairs             Wheelchair Mobility    Modified Rankin (Stroke Patients Only)       Balance Overall balance assessment: Needs assistance Sitting-balance support: Feet supported Sitting balance-Leahy Scale: Normal     Standing balance support: Bilateral upper extremity supported;During functional activity Standing balance-Leahy Scale: Fair Standing balance comment: benefits from BUE support on RW                            Cognition Arousal/Alertness: Awake/alert Behavior During Therapy: WFL for tasks assessed/performed Overall Cognitive Status: No family/caregiver present to determine baseline cognitive functioning                   Orientation Level: Disoriented to;Place;Time;Situation Current Attention Level: Selective Memory: Decreased recall of precautions;Decreased short-term memory Following Commands: Follows one step commands consistently;Follows one step commands with increased time;Follows  multi-step commands inconsistently Safety/Judgement: Decreased awareness of safety;Decreased awareness of deficits Awareness: Emergent Problem Solving: Slow processing;Requires verbal cues General Comments: easily distracted but also easy to redirect; difficulty with  cognitive remediation tasks today and required Mod cues, also asked questions she had previously asked over and over. Asking lots of questions about why she is here and what she can work on to help her memory. Able to use room numbers to figure out she is 2W unit and able to find room once told room number.      Exercises      General Comments General comments (skin integrity, edema, etc.): encouraged STM compensatory strategies including keeping a notebook, also gave her exercise of trying to remember the day and date on a daily basis      Pertinent Vitals/Pain Pain Assessment: No/denies pain Pain Score: 0-No pain Pain Intervention(s): Limited activity within patient's tolerance;Monitored during session    Home Living                      Prior Function            PT Goals (current goals can now be found in the care plan section) Acute Rehab PT Goals Patient Stated Goal: to go home PT Goal Formulation: With patient Time For Goal Achievement: 01/26/19 Potential to Achieve Goals: Fair Progress towards PT goals: Progressing toward goals    Frequency    Min 3X/week      PT Plan Current plan remains appropriate    Co-evaluation              AM-PAC PT "6 Clicks" Mobility   Outcome Measure  Help needed turning from your back to your side while in a flat bed without using bedrails?: None Help needed moving from lying on your back to sitting on the side of a flat bed without using bedrails?: None Help needed moving to and from a bed to a chair (including a wheelchair)?: A Little Help needed standing up from a chair using your arms (e.g., wheelchair or bedside chair)?: A Little Help needed to walk in hospital room?: A Little Help needed climbing 3-5 steps with a railing? : A Little 6 Click Score: 20    End of Session   Activity Tolerance: Patient tolerated treatment well Patient left: in chair;with call bell/phone within reach   PT Visit Diagnosis: Other  abnormalities of gait and mobility (R26.89);Muscle weakness (generalized) (M62.81);Difficulty in walking, not elsewhere classified (R26.2)     Time: 5726-2035 PT Time Calculation (min) (ACUTE ONLY): 23 min  Charges:  $Gait Training: 8-22 mins $Self Care/Home Management: 8-22                     Windell Norfolk, DPT, PN1   Supplemental Physical Therapist [place]    Pager [phone] Acute Rehab Office [phone]

## 2019-01-20 NOTE — Progress Notes (Signed)
Occupational Therapy Treatment Patient Details Name: Marie Woods MRN: [id number] DOB: 20-Nov-1955 Today's Date: 01/20/2019    History of present illness Patient is a 63 year old female with medical history significant of asthma, hypertension, alcohol abuse who was brought in by EMS after neighbors called in stating that patient is confused and altered. Patient admitted Severe Sepsis with Acute metabolic encephalopathy secondary to UTI.    OT comments  Pt not remembering to stop at 2W20 during ambulatory task, but after OTR repeating command, pt following all commands thereafter to stop at a certain room, name the colors on the decorated tree, countbackwards 20-1, and say the months of the year in reverse order. Pt not aware of exact date, but able to use contextual clues that it was December.  Pt standing for grooming tasks with set-upA only and able to pick items from floor with 1 UE supported with no LOB episodes. Pt would benefit from continued OT skilled services for ADL, mobility and safety. OT following acutely for medication management and self care.  Pt appears getting closer to baseline. Pt reports that her sister was living in the same apartment complex. OTR inquiring if pt could stay with her.    Follow Up Recommendations  SNF;Supervision/Assistance - 24 hour    Equipment Recommendations  3 in 1 bedside commode    Recommendations for Other Services      Precautions / Restrictions Precautions Precautions: Fall Precaution Comments: confusion, STM deficits Restrictions Weight Bearing Restrictions: No       Mobility Bed Mobility               General bed mobility comments: OOB in chair  Transfers Overall transfer level: Needs assistance Equipment used: None Transfers: Sit to/from Stand Sit to Stand: Supervision         General transfer comment: Pt standing for grooming tasks and able to pick items from floor with 1 UE supported with no LOB episodes.     Balance Overall balance assessment: Needs assistance Sitting-balance support: Feet supported Sitting balance-Leahy Scale: Normal     Standing balance support: Bilateral upper extremity supported;During functional activity Standing balance-Leahy Scale: Fair Standing balance comment: benefits from BUE support on RW               High Level Balance Comments: picking items from floor           ADL either performed or assessed with clinical judgement   ADL Overall ADL's : Needs assistance/impaired Eating/Feeding: Independent;Sitting   Grooming: Supervision/safety;Standing                   Toilet Transfer: Min guard;Ambulation;RW           Functional mobility during ADLs: Min guard;Supervision/safety;Rolling walker General ADL Comments: Focusing on higher level ADL tasks and multi-step commands     Vision   Vision Assessment?: No apparent visual deficits   Perception     Praxis      Cognition Arousal/Alertness: Awake/alert Behavior During Therapy: WFL for tasks assessed/performed Overall Cognitive Status: No family/caregiver present to determine baseline cognitive functioning Area of Impairment: Safety/judgement;Problem solving;Memory;Orientation                 Orientation Level: Disoriented to;Situation   Memory: Decreased short-term memory Following Commands: Follows multi-step commands with increased time Safety/Judgement: Decreased awareness of safety;Decreased awareness of deficits   Problem Solving: Slow processing;Requires verbal cues General Comments: Pt not remembering to stop at 2W20 during ambulatory task, but  after OTR repeating command, pt following all commands thereafter to stop at a certain room, name the colors on the decorated tree, countbackwards 20-1, and say the months of the year in reverse order. Pt not aware of exact date, but able to use contextual clues that it was December.         Exercises     Shoulder  Instructions       General Comments Pt ambulating in hallways 200' with supervisionA mostly. No verbal cues required for safety.    Pertinent Vitals/ Pain       Pain Assessment: No/denies pain Pain Score: 0-No pain Pain Intervention(s): Monitored during session  Home Living                                          Prior Functioning/Environment              Frequency  Min 2X/week        Progress Toward Goals  OT Goals(current goals can now be found in the care plan section)  Progress towards OT goals: Progressing toward goals  Acute Rehab OT Goals Patient Stated Goal: to go home OT Goal Formulation: With patient Time For Goal Achievement: 02/03/19 Potential to Achieve Goals: Good ADL Goals Pt Will Perform Lower Body Dressing: with modified independence;sit to/from stand Pt Will Transfer to Toilet: with modified independence;ambulating;regular height toilet Pt Will Perform Toileting - Clothing Manipulation and hygiene: with modified independence;sit to/from stand Additional ADL Goal #1: Patient will complete medication management tasks with zero errors in 2/3 trials.  Plan Discharge plan remains appropriate    Co-evaluation                 AM-PAC OT "6 Clicks" Daily Activity     Outcome Measure   Help from another person eating meals?: None Help from another person taking care of personal grooming?: None Help from another person toileting, which includes using toliet, bedpan, or urinal?: A Little Help from another person bathing (including washing, rinsing, drying)?: A Little Help from another person to put on and taking off regular upper body clothing?: None Help from another person to put on and taking off regular lower body clothing?: A Little 6 Click Score: 21    End of Session Equipment Utilized During Treatment: Rolling walker;Gait belt  OT Visit Diagnosis: Unsteadiness on feet (R26.81);Other abnormalities of gait and mobility  (R26.89);Muscle weakness (generalized) (M62.81);Other symptoms and signs involving cognitive function   Activity Tolerance Patient tolerated treatment well   Patient Left in chair;with call bell/phone within reach   Nurse Communication Mobility status        Time: 8185-6314 OT Time Calculation (min): 17 min  Charges: OT General Charges $OT Visit: 1 Visit OT Treatments $Self Care/Home Management : 8-22 mins  Darryl Nestle) Marsa Aris OTR/L Acute Rehabilitation Services Pager: [phone] Office: [phone]    Jenene Slicker Andriea Hasegawa 01/20/2019, 4:06 PM

## 2019-01-21 NOTE — Plan of Care (Signed)

## 2019-01-21 NOTE — Progress Notes (Signed)
[name], female   DOB: 1955-10-14, 63 y.o.   MRN: [id number]  PROGRESS NOTE    Marie Woods  BDZ:[id number] DOB: [birth year] DOA: 12/13/2018 PCP: Ladell Pier, MD   Brief Narrative:  63 year old female with history of asthma, hypertension, alcohol abuse was brought to the hospital confused and altered.  She was found to have UTI; urine culture showed multiple species.  MRI brain was suggestive of possible Wernicke's encephalopathy.  Treated with thiamine.  PT recommended SNF placement.  Assessment & Plan:   Severe sepsis: Present on admission UTI -Urine culture showed multiple species.  Repeat culture showed less than 10,000 colonies.  Was initially treated with IV Rocephin and fluids.  Subsequently antibiotics have been discontinued. -Currently afebrile and hemodynamically stable.  Sepsis has resolved.  Chest x-ray was negative for infection.  Acute metabolic encephalopathy Possible Wernicke's encephalopathy -CT of the head showed mild atrophic changes without acute intracranial abnormality -MRI of the brain: Symmetric diffusion weighted and T2/FLAIR hyperintense signal abnormality within the medial thalami.  Findings favor sequelae of Wernicke's encephalopathy There was question of patient having alcohol abuse however alcohol level less than 10.  Urine drug screen negative -Completed IV thiamine.  Currently on oral thiamine now.  -Continue folic acid, multivitamin. -Outpatient follow-up with neurology. -Mental status still fluctuating.  Essential hypertension -Continue Norvasc.  History of alcohol abuse -Continue folate, thiamine and multivitamin.  Continue CIWA protocol  History of tobacco abuse -Continue nicotine patch  Left lower extremity rash, concerns for fungal infection -On ketoconazole.  Eucerin cream.  Generalized deconditioning Bedbug -Bedbug found on patient on presentation. -PT/OT recommending SNF.  Social worker following. -Feel that  patient does need supervision as she appears to have short-term memory impairment.  We discuss the reason for her hospitalization on a daily basis but she does not remember the next day.  DVT prophylaxis: Lovenox Code Status: Full Family Communication: None at bedside Disposition Plan: SNF once bed is available  Consultants: Neurology via phone  Procedures: None  Antimicrobials:  Anti-infectives (From admission, onward)   Start     Dose/Rate Route Frequency Ordered Stop   12/14/18 0100  cefTRIAXone (ROCEPHIN) 1 g in sodium chloride 0.9 % 100 mL IVPB  Status:  Discontinued     1 g 200 mL/hr over 30 Minutes Intravenous Every 24 hours 12/14/18 0010 12/17/18 0948       Subjective: Patient seen and examined at bedside.  Very poor historian.  Sleepy, wakes up only very slightly.  No fever or vomiting reported.  Objective: Vitals:   01/19/19 2309 01/20/19 0742 01/20/19 1700 01/20/19 2236  BP: (!) 126/93 98/66 130/85 113/74  Pulse: 88 78  84  Resp: 16   16  Temp: (!) 97.5 F (36.4 C) 98 F (36.7 C) 98.5 F (36.9 C) 97.9 F (36.6 C)  TempSrc: Oral Oral Oral Oral  SpO2: 100% 97% 98% 98%  Weight:      Height:        Intake/Output Summary (Last 24 hours) at 01/21/2019 0815 Last data filed at 01/20/2019 2100 Gross per 24 hour  Intake 30 ml  Output --  Net 30 ml   Filed Weights   12/15/18 0101  Weight: 98.4 kg    Examination:  General exam: No distress.  Looks chronically ill.  Looks older than stated age.  Sleepy, wakes up only very slightly. Respiratory system: Bilateral decreased breath sounds at bases   cardiovascular system: Rate controlled, S1-S2 heard Gastrointestinal system: Abdomen  is nondistended, soft and nontender. Normal bowel sounds heard. Extremities: No cyanosis, edema   Data Reviewed: I have personally reviewed following labs and imaging studies  CBC: No results for input(s): WBC, NEUTROABS, HGB, HCT, MCV, PLT in the last 168 hours. Basic Metabolic  Panel: Recent Labs  Lab 01/17/19 0309  CREATININE 0.73   GFR: Estimated Creatinine Clearance: 89 mL/min (by C-G formula based on SCr of 0.73 mg/dL). Liver Function Tests: No results for input(s): AST, ALT, ALKPHOS, BILITOT, PROT, ALBUMIN in the last 168 hours. No results for input(s): LIPASE, AMYLASE in the last 168 hours. No results for input(s): AMMONIA in the last 168 hours. Coagulation Profile: No results for input(s): INR, PROTIME in the last 168 hours. Cardiac Enzymes: No results for input(s): CKTOTAL, CKMB, CKMBINDEX, TROPONINI in the last 168 hours. BNP (last 3 results) No results for input(s): PROBNP in the last 8760 hours. HbA1C: No results for input(s): HGBA1C in the last 72 hours. CBG: No results for input(s): GLUCAP in the last 168 hours. Lipid Profile: No results for input(s): CHOL, HDL, LDLCALC, TRIG, CHOLHDL, LDLDIRECT in the last 72 hours. Thyroid Function Tests: No results for input(s): TSH, T4TOTAL, FREET4, T3FREE, THYROIDAB in the last 72 hours. Anemia Panel: No results for input(s): VITAMINB12, FOLATE, FERRITIN, TIBC, IRON, RETICCTPCT in the last 72 hours. Sepsis Labs: No results for input(s): PROCALCITON, LATICACIDVEN in the last 168 hours.  No results found for this or any previous visit (from the past 240 hour(s)).       Radiology Studies: No results found.      Scheduled Meds: . amLODipine  5 mg Oral Daily  . enoxaparin (LOVENOX) injection  40 mg Subcutaneous Q24H  . feeding supplement (ENSURE ENLIVE)  237 mL Oral BID BM  . folic acid  1 mg Oral Daily  . ketoconazole   Topical BID  . multivitamin with minerals  1 tablet Oral Daily  . nicotine  21 mg Transdermal Daily  . thiamine  100 mg Oral Daily   Continuous Infusions:         Glade Lloyd, MD [facility] 01/21/2019, 8:15 AM

## 2019-01-21 NOTE — TOC Progression Note (Signed)
Transition of Care [facility]) - Progression Note    Patient Details  Name: Marie Woods MRN: [id number] Date of Birth: Mar 04, 1955  Transition of Care [facility]) CM/SW [place], LCSW Phone Number: 01/21/2019, 8:58 AM  Clinical Narrative:    CSW contacted financial counseling to get a status update on patient's Medicaid application as requested by [facility] Supervisor.    Expected Discharge Plan: Skilled Nursing Facility Barriers to Discharge: No SNF bed, SNF Pending payor source - LOG  Expected Discharge Plan and Services Expected Discharge Plan: [place] In-house Referral: Clinical Social Work Discharge Planning Services: NA Post Acute Care Choice: [place] Living arrangements for the past 2 months: Apartment                 DME Arranged: N/A DME Agency: NA                   Social Determinants of Health (SDOH) Interventions    Readmission Risk Interventions No flowsheet data found.

## 2019-01-22 NOTE — Progress Notes (Signed)
[name], female   DOB: May 24, 1955, 63 y.o.   MRN: [id number]  PROGRESS NOTE    Marie Woods  FVC:[id number] DOB: 1955-09-13 DOA: 12/13/2018 PCP: Marcine Matar, MD   Brief Narrative:  63 year old female with history of asthma, hypertension, alcohol abuse was brought to the hospital confused and altered.  She was found to have UTI; urine culture showed multiple species.  MRI brain was suggestive of possible Wernicke's encephalopathy.  Treated with thiamine.  PT recommended SNF placement.  Assessment & Plan:   Severe sepsis: Present on admission UTI -Urine culture showed multiple species.  Repeat culture showed less than 10,000 colonies.  Was initially treated with IV Rocephin and fluids.  Subsequently antibiotics have been discontinued. -Currently afebrile and hemodynamically stable.  Sepsis has resolved.  Chest x-ray was negative for infection.  Acute metabolic encephalopathy Possible Wernicke's encephalopathy -CT of the head showed mild atrophic changes without acute intracranial abnormality -MRI of the brain: Symmetric diffusion weighted and T2/FLAIR hyperintense signal abnormality within the medial thalami.  Findings favor sequelae of Wernicke's encephalopathy There was question of patient having alcohol abuse however alcohol level less than 10.  Urine drug screen negative -Completed IV thiamine.  Currently on oral thiamine now.  -Continue folic acid, multivitamin. -Outpatient follow-up with neurology. -Mental status still fluctuating.  Essential hypertension -Continue Norvasc.  History of alcohol abuse -Continue folate, thiamine and multivitamin.  Continue CIWA protocol  History of tobacco abuse -Continue nicotine patch  Left lower extremity rash, concerns for fungal infection -On ketoconazole.  Eucerin cream.  Generalized deconditioning Bedbug -Bedbug found on patient on presentation. -PT/OT recommending SNF.  Social worker following. -Feel that  patient does need supervision as she appears to have short-term memory impairment.  We discuss the reason for her hospitalization on a daily basis but she does not remember the next day.  DVT prophylaxis: Lovenox Code Status: Full Family Communication: None at bedside Disposition Plan: SNF once bed is available  Consultants: Neurology via phone  Procedures: None  Antimicrobials:  Anti-infectives (From admission, onward)   Start     Dose/Rate Route Frequency Ordered Stop   12/14/18 0100  cefTRIAXone (ROCEPHIN) 1 g in sodium chloride 0.9 % 100 mL IVPB  Status:  Discontinued     1 g 200 mL/hr over 30 Minutes Intravenous Every 24 hours 12/14/18 0010 12/17/18 0948       Subjective: Patient seen and examined at bedside.  Very poor historian.  No overnight fever, vomiting or agitation reported.  Objective: Vitals:   01/20/19 2236 01/21/19 0700 01/21/19 1500 01/21/19 2249  BP: 113/74 115/75 120/75 116/79  Pulse: 84 86 88 79  Resp: 16 15  18   Temp: 97.9 F (36.6 C) 98 F (36.7 C) 97.8 F (36.6 C) 98.4 F (36.9 C)  TempSrc: Oral Oral Oral Oral  SpO2: 98% 99% 99% 100%  Weight:      Height:        Intake/Output Summary (Last 24 hours) at 01/22/2019 0753 Last data filed at 01/21/2019 1300 Gross per 24 hour  Intake 720 ml  Output --  Net 720 ml   Filed Weights   12/15/18 0101  Weight: 98.4 kg    Examination:  General exam: No acute distress.  Looks chronically ill.  Looks older than stated age. Awake but pleasantly confused. Respiratory system: Bilateral decreased breath sounds at bases   cardiovascular system: Rate controlled, S1-S2 heard Gastrointestinal system: Abdomen is nondistended, soft and nontender. Normal bowel sounds heard.  Extremities: No cyanosis, edema   Data Reviewed: I have personally reviewed following labs and imaging studies  CBC: No results for input(s): WBC, NEUTROABS, HGB, HCT, MCV, PLT in the last 168 hours. Basic Metabolic Panel: Recent Labs   Lab 01/17/19 0309  CREATININE 0.73   GFR: Estimated Creatinine Clearance: 89 mL/min (by C-G formula based on SCr of 0.73 mg/dL). Liver Function Tests: No results for input(s): AST, ALT, ALKPHOS, BILITOT, PROT, ALBUMIN in the last 168 hours. No results for input(s): LIPASE, AMYLASE in the last 168 hours. No results for input(s): AMMONIA in the last 168 hours. Coagulation Profile: No results for input(s): INR, PROTIME in the last 168 hours. Cardiac Enzymes: No results for input(s): CKTOTAL, CKMB, CKMBINDEX, TROPONINI in the last 168 hours. BNP (last 3 results) No results for input(s): PROBNP in the last 8760 hours. HbA1C: No results for input(s): HGBA1C in the last 72 hours. CBG: No results for input(s): GLUCAP in the last 168 hours. Lipid Profile: No results for input(s): CHOL, HDL, LDLCALC, TRIG, CHOLHDL, LDLDIRECT in the last 72 hours. Thyroid Function Tests: No results for input(s): TSH, T4TOTAL, FREET4, T3FREE, THYROIDAB in the last 72 hours. Anemia Panel: No results for input(s): VITAMINB12, FOLATE, FERRITIN, TIBC, IRON, RETICCTPCT in the last 72 hours. Sepsis Labs: No results for input(s): PROCALCITON, LATICACIDVEN in the last 168 hours.  No results found for this or any previous visit (from the past 240 hour(s)).       Radiology Studies: No results found.      Scheduled Meds: . amLODipine  5 mg Oral Daily  . enoxaparin (LOVENOX) injection  40 mg Subcutaneous Q24H  . feeding supplement (ENSURE ENLIVE)  237 mL Oral BID BM  . folic acid  1 mg Oral Daily  . ketoconazole   Topical BID  . multivitamin with minerals  1 tablet Oral Daily  . nicotine  21 mg Transdermal Daily  . thiamine  100 mg Oral Daily   Continuous Infusions:         Aline August, MD [facility] 01/22/2019, 7:53 AM

## 2019-01-23 MED ORDER — ALBUTEROL SULFATE (2.5 MG/3ML) 0.083% IN NEBU
2.5000 mg | INHALATION_SOLUTION | RESPIRATORY_TRACT | Status: DC | PRN
  Administered 2019-02-24 – 2019-03-05 (×5): 2.5 mg via RESPIRATORY_TRACT
  Filled 2019-01-23 (×6): qty 3

## 2019-01-23 MED ORDER — ALBUTEROL SULFATE HFA 108 (90 BASE) MCG/ACT IN AERS: 2.0000 | INHALATION_SPRAY | RESPIRATORY_TRACT | Status: DC | PRN

## 2019-01-23 NOTE — Progress Notes (Signed)
Late entry due to missed treatment note.   01/23/19 1300  OT Visit Information  Last OT Received On 01/23/19  Assistance Needed +1  History of Present Illness Patient is a 63 year old female with medical history significant of asthma, hypertension, alcohol abuse who was brought in by EMS after neighbors called in stating that patient is confused and altered. Patient admitted Severe Sepsis with Acute metabolic encephalopathy secondary to UTI.   Precautions  Precautions Fall  Precaution Comments confusion, STM deficits  Pain Assessment  Pain Assessment No/denies pain  Cognition  Arousal/Alertness Awake/alert  Behavior During Therapy WFL for tasks assessed/performed  Overall Cognitive Status No family/caregiver present to determine baseline cognitive functioning  Area of Impairment Safety/judgement;Problem solving;Memory;Following commands;Awareness;Attention  Orientation Level Disoriented to;Situation  Memory Decreased short-term memory  Following Commands Follows multi-step commands inconsistently  Safety/Judgement Decreased awareness of deficits  General Comments pt asking multiple times where her wallet and phone were, although there was a note on her closet door reminding her that she left them at home  ADL  Grooming Brushing hair;Supervision/safety;Standing  Upper Body Dressing  Minimal assistance;Sitting  Upper Body Dressing Details (indicate cue type and reason) assist to orient front opening gown prior to ambulating in hall  Toilet Transfer Min guard;RW;Ambulation  Toileting- Water quality scientist and Hygiene Min guard;Sit to/from stand  Functional mobility during ADLs Min guard;Rolling walker  General ADL Comments Educated pt in possible safety issues related to her poor memory ie: locking the doors of her home, turning off the stove, recalling MD appointments. Also educated in compensatory strategies--pill box use, calendar, making lists.  Bed Mobility  General bed mobility  comments pt seated at EOB upon arrival  Balance  Overall balance assessment Needs assistance  Sitting balance-Leahy Scale Normal  Standing balance-Leahy Scale Fair  Standing balance comment fair statically at sink  Transfers  Overall transfer level Needs assistance  Equipment used Rolling walker (2 wheeled)  Transfers Sit to/from Stand  Sit to Stand Supervision  General transfer comment for safety  OT - End of Session  Equipment Utilized During Treatment Rolling walker;Gait belt  Activity Tolerance Patient tolerated treatment well  Patient left in bed;with call bell/phone within reach;with bed alarm set  OT Assessment/Plan  OT Plan Discharge plan remains appropriate;Frequency remains appropriate  OT Visit Diagnosis Unsteadiness on feet (R26.81);Other abnormalities of gait and mobility (R26.89);Muscle weakness (generalized) (M62.81);Other symptoms and signs involving cognitive function  OT Frequency (ACUTE ONLY) Min 2X/week  Follow Up Recommendations SNF;Supervision/Assistance - 24 hour  OT Equipment 3 in 1 bedside commode  AM-PAC OT "6 Clicks" Daily Activity Outcome Measure (Version 2)  Help from another person eating meals? 4  Help from another person taking care of personal grooming? 3  Help from another person toileting, which includes using toliet, bedpan, or urinal? 3  Help from another person bathing (including washing, rinsing, drying)? 3  Help from another person to put on and taking off regular upper body clothing? 4  Help from another person to put on and taking off regular lower body clothing? 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

## 2019-01-23 NOTE — Progress Notes (Signed)
Occupational Therapy Treatment Patient Details Name: Marie Woods MRN: [id number] DOB: 05/14/1955 Today's Date: 01/23/2019    History of present illness Patient is a 63 year old female with medical history significant of asthma, hypertension, alcohol abuse who was brought in by EMS after neighbors called in stating that patient is confused and altered. Patient admitted Severe Sepsis with Acute metabolic encephalopathy secondary to UTI.    OT comments  Patient agreeable to OT, reports "I need to wash my face first".  Transitioned to EOB without assist, reports mild dizziness; educated on safety techniques with dizziness.  Supervision sit to stand, and min guard to close supervision for in room mobility using RW; min guard without RW, as pt pushes it away to complete mobility into restroom. Patient requires cueing for safety, recall and problem solving during session, requires re-direction to task and recall of tasks already completed during session.  Functionally completing ADLs with min guard to supervision, but limited by cognition.  Will follow acutely.    Follow Up Recommendations  SNF;Supervision/Assistance - 24 hour    Equipment Recommendations  3 in 1 bedside commode    Recommendations for Other Services      Precautions / Restrictions Precautions Precautions: Fall Precaution Comments: confusion, STM deficits Restrictions Weight Bearing Restrictions: No       Mobility Bed Mobility Overal bed mobility: Modified Independent                Transfers Overall transfer level: Needs assistance Equipment used: None Transfers: Sit to/from Stand Sit to Stand: Supervision         General transfer comment: sit to stand with supervision, min guard for safety during toilet transfer; benefits from RW     Balance Overall balance assessment: Needs assistance Sitting-balance support: Feet supported Sitting balance-Leahy Scale: Normal     Standing balance support:  Bilateral upper extremity supported;No upper extremity supported;During functional activity Standing balance-Leahy Scale: Fair Standing balance comment: benefits from BUE support on RW                           ADL either performed or assessed with clinical judgement   ADL Overall ADL's : Needs assistance/impaired     Grooming: Supervision/safety;Standing Grooming Details (indicate cue type and reason): pt stood at sink to engage in washing face and brushing teeth with close supervision, reports mild dizziness        Lower Body Bathing Details (indicate cue type and reason): seated at sink to apply lotion to legs with setup assist          Toilet Transfer: Min guard;Ambulation;Regular Toilet;Grab bars Toilet Transfer Details (indicate cue type and reason): pt pushed walker out of the way reporting "I can get there faster without the walker"  Toileting- Clothing Manipulation and Hygiene: Min guard;Sit to/from stand;Cueing for safety       Functional mobility during ADLs: Min guard;Rolling walker(or no AD) General ADL Comments: pt engaged in ADLs today, requires min guard to supervision for safety using RW at times; she has poor recall of tasks completed during session     Vision       Perception     Praxis      Cognition Arousal/Alertness: Awake/alert Behavior During Therapy: WFL for tasks assessed/performed Overall Cognitive Status: No family/caregiver present to determine baseline cognitive functioning Area of Impairment: Safety/judgement;Problem solving;Memory;Following commands;Awareness;Attention  Current Attention Level: Sustained Memory: Decreased short-term memory Following Commands: Follows one step commands consistently;Follows one step commands with increased time;Follows multi-step commands inconsistently Safety/Judgement: Decreased awareness of safety;Decreased awareness of deficits Awareness: Emergent Problem Solving:  Slow processing;Requires verbal cues;Difficulty sequencing General Comments: patient with poor recall throughout ADL session, patient asking multiple times if she had her medication or ate breakfast, then requires cueing to recall having already washed her face, drank orange juice during session; oriented today         Exercises     Shoulder Instructions       General Comments session limited due to reports of "dizziness" during session, appears fatigued    Pertinent Vitals/ Pain       Pain Assessment: No/denies pain  Home Living                                          Prior Functioning/Environment              Frequency  Min 2X/week        Progress Toward Goals  OT Goals(current goals can now be found in the care plan section)  Progress towards OT goals: Progressing toward goals  Acute Rehab OT Goals Patient Stated Goal: to go home OT Goal Formulation: With patient  Plan Discharge plan remains appropriate;Frequency remains appropriate    Co-evaluation                 AM-PAC OT "6 Clicks" Daily Activity     Outcome Measure   Help from another person eating meals?: None Help from another person taking care of personal grooming?: A Little Help from another person toileting, which includes using toliet, bedpan, or urinal?: A Little Help from another person bathing (including washing, rinsing, drying)?: A Little Help from another person to put on and taking off regular upper body clothing?: None Help from another person to put on and taking off regular lower body clothing?: A Little 6 Click Score: 20    End of Session Equipment Utilized During Treatment: Gait belt;Rolling walker  OT Visit Diagnosis: Unsteadiness on feet (R26.81);Other abnormalities of gait and mobility (R26.89);Muscle weakness (generalized) (M62.81);Other symptoms and signs involving cognitive function   Activity Tolerance (limited by dizziness and confusion )    Patient Left with call bell/phone within reach;with bed alarm set;Other (comment)(seated EOB )   Nurse Communication Mobility status        Time: 0981-1914 OT Time Calculation (min): 20 min  Charges: OT General Charges $OT Visit: 1 Visit OT Treatments $Self Care/Home Management : 8-22 mins  Barry Brunner, OT Acute Rehabilitation Services Pager [phone] Office [phone]    Chancy Milroy 01/23/2019, 10:43 AM

## 2019-01-23 NOTE — Progress Notes (Signed)
[name], female   DOB: 08/16/1955, 63 y.o.   MRN: [id number]  PROGRESS NOTE    Marie Woods  TDD:[id number] DOB: Jun 22, 1955 DOA: 12/13/2018 PCP: Marcine Matar, MD   Brief Narrative:  63 year old female with history of asthma, hypertension, alcohol abuse was brought to the hospital confused and altered.  She was found to have UTI; urine culture showed multiple species.  MRI brain was suggestive of possible Wernicke's encephalopathy.  Treated with thiamine.  PT recommended SNF placement.  Assessment & Plan:   Severe sepsis: Present on admission UTI -Urine culture showed multiple species.  Repeat culture showed less than 10,000 colonies.  Was initially treated with IV Rocephin and fluids.  Subsequently antibiotics have been discontinued. -Currently afebrile and hemodynamically stable.  Sepsis has resolved.  Chest x-ray was negative for infection.  Acute metabolic encephalopathy Possible Wernicke's encephalopathy -CT of the head showed mild atrophic changes without acute intracranial abnormality -MRI of the brain: Symmetric diffusion weighted and T2/FLAIR hyperintense signal abnormality within the medial thalami.  Findings favor sequelae of Wernicke's encephalopathy There was question of patient having alcohol abuse however alcohol level less than 10.  Urine drug screen negative -Completed IV thiamine.  Currently on oral thiamine now.  -Continue folic acid, multivitamin. -Outpatient follow-up with neurology. -Mental status still fluctuating.  Essential hypertension -Continue Norvasc.  History of alcohol abuse -Continue folate, thiamine and multivitamin.  Continue CIWA protocol  History of tobacco abuse -Continue nicotine patch  Left lower extremity rash, concerns for fungal infection -On ketoconazole.  Eucerin cream.  Generalized deconditioning Bedbug -Bedbug found on patient on presentation. -PT/OT recommending SNF.  Social worker following. -Feel that  patient does need supervision as she appears to have short-term memory impairment.  We discuss the reason for her hospitalization on a daily basis but she does not remember the next day.  DVT prophylaxis: Lovenox Code Status: Full Family Communication: None at bedside Disposition Plan: SNF once bed is available  Consultants: Neurology via phone  Procedures: None  Antimicrobials:  Anti-infectives (From admission, onward)   Start     Dose/Rate Route Frequency Ordered Stop   12/14/18 0100  cefTRIAXone (ROCEPHIN) 1 g in sodium chloride 0.9 % 100 mL IVPB  Status:  Discontinued     1 g 200 mL/hr over 30 Minutes Intravenous Every 24 hours 12/14/18 0010 12/17/18 0948       Subjective: Patient seen and examined at bedside.  Very poor historian.  No fever, vomiting or worsening shortness of breath reported  objective: Vitals:   01/21/19 2249 01/22/19 0804 01/22/19 1547 01/22/19 2100  BP: 116/79 (!) 107/54 116/70 (!) 126/92  Pulse: 79 93 96 88  Resp: 18   18  Temp: 98.4 F (36.9 C) 98.1 F (36.7 C) 98.6 F (37 C) 98.1 F (36.7 C)  TempSrc: Oral Oral Oral Oral  SpO2: 100% 100% 99% 99%  Weight:      Height:        Intake/Output Summary (Last 24 hours) at 01/23/2019 0740 Last data filed at 01/22/2019 1400 Gross per 24 hour  Intake 720 ml  Output --  Net 720 ml   Filed Weights   12/15/18 0101  Weight: 98.4 kg    Examination:  General exam: No distress.  Looks chronically ill.  Looks older than stated age.  Sleepy, wakes up only very slightly, hardly answers any questions. Respiratory system: Bilateral decreased breath sounds at bases   cardiovascular system: Rate controlled, S1-S2 heard Gastrointestinal system:  Abdomen is nondistended, soft and nontender. Normal bowel sounds heard. Extremities: No cyanosis, edema   Data Reviewed: I have personally reviewed following labs and imaging studies  CBC: No results for input(s): WBC, NEUTROABS, HGB, HCT, MCV, PLT in the last  168 hours. Basic Metabolic Panel: Recent Labs  Lab 01/17/19 0309  CREATININE 0.73   GFR: Estimated Creatinine Clearance: 89 mL/min (by C-G formula based on SCr of 0.73 mg/dL). Liver Function Tests: No results for input(s): AST, ALT, ALKPHOS, BILITOT, PROT, ALBUMIN in the last 168 hours. No results for input(s): LIPASE, AMYLASE in the last 168 hours. No results for input(s): AMMONIA in the last 168 hours. Coagulation Profile: No results for input(s): INR, PROTIME in the last 168 hours. Cardiac Enzymes: No results for input(s): CKTOTAL, CKMB, CKMBINDEX, TROPONINI in the last 168 hours. BNP (last 3 results) No results for input(s): PROBNP in the last 8760 hours. HbA1C: No results for input(s): HGBA1C in the last 72 hours. CBG: No results for input(s): GLUCAP in the last 168 hours. Lipid Profile: No results for input(s): CHOL, HDL, LDLCALC, TRIG, CHOLHDL, LDLDIRECT in the last 72 hours. Thyroid Function Tests: No results for input(s): TSH, T4TOTAL, FREET4, T3FREE, THYROIDAB in the last 72 hours. Anemia Panel: No results for input(s): VITAMINB12, FOLATE, FERRITIN, TIBC, IRON, RETICCTPCT in the last 72 hours. Sepsis Labs: No results for input(s): PROCALCITON, LATICACIDVEN in the last 168 hours.  No results found for this or any previous visit (from the past 240 hour(s)).       Radiology Studies: No results found.      Scheduled Meds: . amLODipine  5 mg Oral Daily  . enoxaparin (LOVENOX) injection  40 mg Subcutaneous Q24H  . feeding supplement (ENSURE ENLIVE)  237 mL Oral BID BM  . folic acid  1 mg Oral Daily  . ketoconazole   Topical BID  . multivitamin with minerals  1 tablet Oral Daily  . nicotine  21 mg Transdermal Daily  . thiamine  100 mg Oral Daily   Continuous Infusions:         Aline August, MD [facility] 01/23/2019, 7:40 AM

## 2019-01-23 NOTE — Progress Notes (Signed)
Physical Therapy Treatment Patient Details Name: Marie Woods MRN: [id number] DOB: [birth year] Today's Date: 01/23/2019    History of Present Illness Patient is a 63 year old female with medical history significant of asthma, hypertension, alcohol abuse who was brought in by EMS after neighbors called in stating that patient is confused and altered. Patient admitted Severe Sepsis with Acute metabolic encephalopathy secondary to UTI.     PT Comments    Pt was seen for mobility with RW, unsafe with her use at times.  Today was at the end of her walk, with no warning pushed the walker quickly away from her and made a joke that PT did not get her sense of humor.  Pt is repetitively asking questions about her nurse helping her with a task that could not be done until PT finished, and pt was reminded mult times about this.  Continue on with acute therapy to manage her safety and balance with gait and transfers as she moves on to rehab setting eventually.     Follow Up Recommendations  SNF     Equipment Recommendations  Rolling walker with 5" wheels    Recommendations for Other Services       Precautions / Restrictions Precautions Precautions: Fall Precaution Comments: confused, perseverates Restrictions Weight Bearing Restrictions: No    Mobility  Bed Mobility Overal bed mobility: Modified Independent Bed Mobility: Supine to Sit;Sit to Supine     Supine to sit: Modified independent (Device/Increase time) Sit to supine: Modified independent (Device/Increase time)      Transfers Overall transfer level: Needs assistance Equipment used: Rolling walker (2 wheeled);1 person hand held assist Transfers: Sit to/from Stand Sit to Stand: Min guard         General transfer comment: pt is impulsively standing, reports she is up to walk alone  Ambulation/Gait Ambulation/Gait assistance: Min guard Gait Distance (Feet): 300 Feet Assistive device: Rolling walker (2 wheeled);1 person  hand held assist Gait Pattern/deviations: Step-through pattern;Narrow base of support;Drifts right/left;Decreased stride length Gait velocity: decreased Gait velocity interpretation: <1.31 ft/sec, indicative of household ambulator General Gait Details: pt was unsafe with walker at end, suddenly pushed the walker away from her and said lets just walk without it.  Returned it to in front of her, and she stated she did not understand why that was not amusing   Marine scientist Rankin (Stroke Patients Only)       Balance     Sitting balance-Leahy Scale: Normal       Standing balance-Leahy Scale: Fair                              Cognition Arousal/Alertness: Awake/alert Behavior During Therapy: Impulsive Overall Cognitive Status: No family/caregiver present to determine baseline cognitive functioning Area of Impairment: Problem solving;Awareness;Safety/judgement;Memory;Orientation                 Orientation Level: Situation Current Attention Level: Selective Memory: Decreased recall of precautions;Decreased short-term memory Following Commands: Follows one step commands inconsistently;Follows one step commands with increased time Safety/Judgement: Decreased awareness of safety;Decreased awareness of deficits Awareness: Intellectual Problem Solving: Slow processing;Requires verbal cues General Comments: frequent repetitive questions      Exercises General Exercises - Lower Extremity Long Arc Quad: Strengthening;10 reps Heel Slides: Strengthening;10 reps    General Comments General comments (skin integrity, edema, etc.): no balance  related symptoms, but is impulsive and unsafe at times      Pertinent Vitals/Pain Pain Assessment: No/denies pain    Home Living                      Prior Function            PT Goals (current goals can now be found in the care plan section) Acute Rehab PT  Goals Patient Stated Goal: return to PLOF Progress towards PT goals: Progressing toward goals    Frequency    Min 3X/week      PT Plan Current plan remains appropriate    Co-evaluation              AM-PAC PT "6 Clicks" Mobility   Outcome Measure  Help needed turning from your back to your side while in a flat bed without using bedrails?: None Help needed moving from lying on your back to sitting on the side of a flat bed without using bedrails?: A Little Help needed moving to and from a bed to a chair (including a wheelchair)?: A Little Help needed standing up from a chair using your arms (e.g., wheelchair or bedside chair)?: A Little Help needed to walk in hospital room?: A Little Help needed climbing 3-5 steps with a railing? : A Lot 6 Click Score: 18    End of Session Equipment Utilized During Treatment: Gait belt Activity Tolerance: Patient tolerated treatment well Patient left: in chair;with call bell/phone within reach Nurse Communication: Mobility status PT Visit Diagnosis: Other abnormalities of gait and mobility (R26.89);Muscle weakness (generalized) (M62.81);Difficulty in walking, not elsewhere classified (R26.2)     Time: 3614-4315 PT Time Calculation (min) (ACUTE ONLY): 32 min  Charges:  $Gait Training: 8-22 mins $Therapeutic Exercise: 8-22 mins                   Ivar Drape 01/23/2019, 7:23 PM   Samul Dada, PT MS Acute Rehab Dept. Number: [facility] [phone] and [facility] [phone]

## 2019-01-24 LAB — CREATININE, SERUM
Creatinine, Ser: 0.98 mg/dL (ref 0.44–1.00)
GFR calc Af Amer: >60 mL/min (ref >60)
GFR calc non Af Amer: >60 mL/min (ref >60)

## 2019-01-24 MED ORDER — ALPRAZOLAM 0.25 MG PO TABS
0.2500 mg | ORAL_TABLET | Freq: Three times a day (TID) | ORAL | Status: DC | PRN
  Administered 2019-01-24 – 2019-01-28 (×4): 0.25 mg via ORAL
  Filled 2019-01-24 (×5): qty 1

## 2019-01-24 NOTE — Progress Notes (Signed)
Physical Therapy Treatment Patient Details Name: Marie Woods MRN: [id number] DOB: 17-Jan-1956 Today's Date: 01/24/2019    History of Present Illness Patient is a 63 year old female with medical history significant of asthma, hypertension, alcohol abuse who was brought in by EMS after neighbors called in stating that patient is confused and altered. Patient admitted Severe Sepsis with Acute metabolic encephalopathy secondary to UTI.     PT Comments    Pt was seen for mobility with a monitoring of symptoms, and with the exception of brief light headed feeling she was comfortable with an increased walking distance.  Her plan is to progressw ith all her goals of therapy, and expect her to be discharged to skilled care for continued work with residual feelings from Wernicke's encephalopathy.  Pt is other wise making good progress, more stable gait and more consistent with  performance of all ex and with use of RW.  See acutely for these needs.  Follow Up Recommendations  SNF     Equipment Recommendations  Rolling walker with 5" wheels    Recommendations for Other Services       Precautions / Restrictions Precautions Precautions: Fall Precaution Comments: mild confusion Restrictions Weight Bearing Restrictions: No    Mobility  Bed Mobility Overal bed mobility: Modified Independent Bed Mobility: Supine to Sit     Supine to sit: Modified independent (Device/Increase time)        Transfers Overall transfer level: Needs assistance Equipment used: Rolling walker (2 wheeled);1 person hand held assist Transfers: Sit to/from Stand Sit to Stand: Supervision         General transfer comment: requires no help to stand directly but supervised for safety  Ambulation/Gait Ambulation/Gait assistance: Min guard Gait Distance (Feet): 500 Feet Assistive device: Rolling walker (2 wheeled) Gait Pattern/deviations: Step-through pattern;Wide base of support;Drifts right/left;Decreased  stride length Gait velocity: decreased Gait velocity interpretation: <1.31 ft/sec, indicative of household ambulator General Gait Details: more controlled today, calmer, able to use walker with minor consistent reminders to keep it away from [employer] Mobility    Modified Rankin (Stroke Patients Only)       Balance Overall balance assessment: Needs assistance Sitting-balance support: Feet supported Sitting balance-Leahy Scale: Good     Standing balance support: Bilateral upper extremity supported;During functional activity Standing balance-Leahy Scale: Fair Standing balance comment: fair with no walker                             Cognition Arousal/Alertness: Awake/alert Behavior During Therapy: Impulsive Overall Cognitive Status: No family/caregiver present to determine baseline cognitive functioning Area of Impairment: Problem solving;Awareness;Safety/judgement;Following commands;Attention                   Current Attention Level: Selective Memory: Decreased short-term memory Following Commands: Follows one step commands inconsistently;Follows one step commands with increased time Safety/Judgement: Decreased awareness of safety;Decreased awareness of deficits Awareness: Intellectual Problem Solving: Slow processing;Requires verbal cues General Comments: requires help for walking on the hall since she is not observant consistently to avoid obstacles      Exercises      General Comments General comments (skin integrity, edema, etc.): pt reported brief moment of lightheaded feeling on the hall and stopped near the end of walk, passed and continued to her room with no issue      Pertinent Vitals/Pain Pain Assessment: No/denies  pain    Home Living                      Prior Function            PT Goals (current goals can now be found in the care plan section) Acute Rehab PT Goals Patient Stated  Goal: return to PLOF Progress towards PT goals: Progressing toward goals    Frequency    Min 3X/week      PT Plan Current plan remains appropriate    Co-evaluation              AM-PAC PT "6 Clicks" Mobility   Outcome Measure  Help needed turning from your back to your side while in a flat bed without using bedrails?: None Help needed moving from lying on your back to sitting on the side of a flat bed without using bedrails?: None Help needed moving to and from a bed to a chair (including a wheelchair)?: A Little Help needed standing up from a chair using your arms (e.g., wheelchair or bedside chair)?: A Little Help needed to walk in hospital room?: A Little Help needed climbing 3-5 steps with a railing? : A Lot 6 Click Score: 19    End of Session Equipment Utilized During Treatment: Gait belt Activity Tolerance: Patient tolerated treatment well Patient left: in bed;with call bell/phone within reach(sitting side of bed to get her hair done) Nurse Communication: Mobility status PT Visit Diagnosis: Other abnormalities of gait and mobility (R26.89);Muscle weakness (generalized) (M62.81);Difficulty in walking, not elsewhere classified (R26.2)     Time: 0630-1601 PT Time Calculation (min) (ACUTE ONLY): 14 min  Charges:  $Gait Training: 8-22 mins                    Ramond Dial 01/24/2019, 1:07 PM   Mee Hives, PT MS Acute Rehab Dept. Number: [place] [facility] [place]

## 2019-01-24 NOTE — TOC Progression Note (Signed)
Transition of Care [facility]) - Progression Note    Patient Details  Name: Marie Woods MRN: [id number] Date of Birth: [birth year]  Transition of Care [facility] CM/SW [place], LCSW Phone Number: 01/24/2019, 2:56 PM  Clinical Narrative:    Per Financial Counseling, patient is not currently eligible for Medicaid. CSW to follow up with [facility] Director.    Expected Discharge Plan: Skilled Nursing Facility Barriers to Discharge: No SNF bed, SNF Pending payor source - LOG  Expected Discharge Plan and Services Expected Discharge Plan: [place] In-house Referral: Clinical Social Work Discharge Planning Services: NA Post Acute Care Choice: [place] Living arrangements for the past 2 months: Apartment                 DME Arranged: N/A DME Agency: NA                   Social Determinants of Health (SDOH) Interventions    Readmission Risk Interventions No flowsheet data found.

## 2019-01-24 NOTE — Progress Notes (Signed)
Nutrition Follow-up  DOCUMENTATION CODES:   Obesity unspecified  INTERVENTION:   - Continue MVI with minerals daily  -Ensure Enlive po BID, each supplement provides 350 kcal and 20 grams of protein  NUTRITION DIAGNOSIS:   Increased nutrient needs related to acute illness as evidenced by estimated needs.  Ongoing.  GOAL:   Patient will meet greater than or equal to 90% of their needs  Progressing.  MONITOR:   PO intake, Supplement acceptance, Labs, Weight trends  ASSESSMENT:   63 year old female who presented to the ED on 11/10 with AMS. PMH of asthma, EtOH abuse, HTN. Pt admitted with severe sepsis secondary to UTI. Pt found to have Wernicke's encephalopathy.  **RD working remotely**  Per MD note, pt continues to be medically stable for discharge. Awaiting placement.  Pt currently consuming 75-100% of meals and is drinking supplements that are ordered.   Admission weight: 217 lbs. No new weights since 11/12.   Labs reviewed. Medications: Folic acid tablet,Multivitamin with minerals daily, Thiamine tablet  Diet Order:   Diet Order            Diet Heart Room service appropriate? No; Fluid consistency: Thin  Diet effective now              EDUCATION NEEDS:   Not appropriate for education at this time  Skin:  Skin Assessment: Reviewed RN Assessment  Last BM:  12/19  Height:   Ht Readings from Last 1 Encounters:  12/15/18 5\' 7"  (1.702 m)    Weight:   Wt Readings from Last 1 Encounters:  12/15/18 98.4 kg    Ideal Body Weight:  61.4 kg  BMI:  Body mass index is 33.99 kg/m.  Estimated Nutritional Needs:   Kcal:  1900-2100  Protein:  95-110 grams  Fluid:  >/= 1.8 L  Clayton Bibles, MS, RD, LDN Inpatient Clinical Dietitian Pager: [phone] After Hours Pager: [phone]

## 2019-01-24 NOTE — Progress Notes (Signed)
[name], female   DOB: May 23, 1955, 63 y.o.   MRN: [id number]  PROGRESS NOTE    Marie Woods  PNT:[id number] DOB: 12/31/1955 DOA: 12/13/2018 PCP: Ladell Pier, MD   Brief Narrative:  63 year old female with history of asthma, hypertension, alcohol abuse was brought to the hospital confused and altered.  She was found to have UTI; urine culture showed multiple species.  MRI brain was suggestive of possible Wernicke's encephalopathy.  Treated with thiamine.  PT recommended SNF placement.  Assessment & Plan:   Severe sepsis: Present on admission UTI -Urine culture showed multiple species.  Repeat culture showed less than 10,000 colonies.  Was initially treated with IV Rocephin and fluids.  Subsequently antibiotics have been discontinued. -Currently afebrile and hemodynamically stable.  Sepsis has resolved.  Chest x-ray was negative for infection.  Acute metabolic encephalopathy Possible Wernicke's encephalopathy -CT of the head showed mild atrophic changes without acute intracranial abnormality -MRI of the brain: Symmetric diffusion weighted and T2/FLAIR hyperintense signal abnormality within the medial thalami.  Findings favor sequelae of Wernicke's encephalopathy There was question of patient having alcohol abuse however alcohol level less than 10.  Urine drug screen negative -Completed IV thiamine.  Currently on oral thiamine now.  -Continue folic acid, multivitamin. -Outpatient follow-up with neurology. -Mental status still fluctuating.  Essential hypertension -Continue Norvasc.  History of alcohol abuse -Continue folate, thiamine and multivitamin.  Continue CIWA protocol  History of tobacco abuse -Continue nicotine patch  Left lower extremity rash, concerns for fungal infection -On ketoconazole.  Eucerin cream.  Generalized deconditioning Bedbug -Bedbug found on patient on presentation. -PT/OT recommending SNF.  Social worker following. -Feel that  patient does need supervision as she appears to have short-term memory impairment.  We discuss the reason for her hospitalization on a daily basis but she does not remember the next day.  DVT prophylaxis: Lovenox Code Status: Full Family Communication: None at bedside Disposition Plan: SNF once bed is available  Consultants: Neurology via phone  Procedures: None  Antimicrobials:  Anti-infectives (From admission, onward)   Start     Dose/Rate Route Frequency Ordered Stop   12/14/18 0100  cefTRIAXone (ROCEPHIN) 1 g in sodium chloride 0.9 % 100 mL IVPB  Status:  Discontinued     1 g 200 mL/hr over 30 Minutes Intravenous Every 24 hours 12/14/18 0010 12/17/18 0948       Subjective: Patient seen and examined at bedside.  Very poor historian.  No agitation, fever reported by nursing staff.   Objective: Vitals:   01/23/19 1200 01/23/19 1556 01/23/19 2230 01/24/19 0813  BP: 104/74 103/76 111/81 106/74  Pulse:  79 78 72  Resp:   20 19  Temp:  98.7 F (37.1 C) (!) 97.5 F (36.4 C) 97.9 F (36.6 C)  TempSrc:  Oral  Oral  SpO2:  97% 98% 98%  Weight:      Height:       No intake or output data in the 24 hours ending 01/24/19 0921 Filed Weights   12/15/18 0101  Weight: 98.4 kg    Examination:  General exam: No acute distress.  Looks chronically ill.  Looks older than stated age.  Wakes up slightly, pleasantly confused. Respiratory system: Bilateral decreased breath sounds at bases   cardiovascular system: Rate controlled, S1-S2 heard Gastrointestinal system: Abdomen is nondistended, soft and nontender. Normal bowel sounds heard.  Data Reviewed: I have personally reviewed following labs and imaging studies  CBC: No results for input(s): WBC,  NEUTROABS, HGB, HCT, MCV, PLT in the last 168 hours. Basic Metabolic Panel: Recent Labs  Lab 01/24/19 0525  CREATININE 0.98   GFR: Estimated Creatinine Clearance: 72.6 mL/min (by C-G formula based on SCr of 0.98 mg/dL). Liver  Function Tests: No results for input(s): AST, ALT, ALKPHOS, BILITOT, PROT, ALBUMIN in the last 168 hours. No results for input(s): LIPASE, AMYLASE in the last 168 hours. No results for input(s): AMMONIA in the last 168 hours. Coagulation Profile: No results for input(s): INR, PROTIME in the last 168 hours. Cardiac Enzymes: No results for input(s): CKTOTAL, CKMB, CKMBINDEX, TROPONINI in the last 168 hours. BNP (last 3 results) No results for input(s): PROBNP in the last 8760 hours. HbA1C: No results for input(s): HGBA1C in the last 72 hours. CBG: No results for input(s): GLUCAP in the last 168 hours. Lipid Profile: No results for input(s): CHOL, HDL, LDLCALC, TRIG, CHOLHDL, LDLDIRECT in the last 72 hours. Thyroid Function Tests: No results for input(s): TSH, T4TOTAL, FREET4, T3FREE, THYROIDAB in the last 72 hours. Anemia Panel: No results for input(s): VITAMINB12, FOLATE, FERRITIN, TIBC, IRON, RETICCTPCT in the last 72 hours. Sepsis Labs: No results for input(s): PROCALCITON, LATICACIDVEN in the last 168 hours.  No results found for this or any previous visit (from the past 240 hour(s)).       Radiology Studies: No results found.      Scheduled Meds: . amLODipine  5 mg Oral Daily  . enoxaparin (LOVENOX) injection  40 mg Subcutaneous Q24H  . feeding supplement (ENSURE ENLIVE)  237 mL Oral BID BM  . folic acid  1 mg Oral Daily  . ketoconazole   Topical BID  . multivitamin with minerals  1 tablet Oral Daily  . nicotine  21 mg Transdermal Daily  . thiamine  100 mg Oral Daily   Continuous Infusions:         Glade Lloyd, MD [facility] 01/24/2019, 9:21 AM

## 2019-01-24 NOTE — Progress Notes (Signed)
Daily Progress Note   Patient calm cooperative, remains confused and needs to be reoriented often. Patient is still awaiting placement and dc orders

## 2019-01-25 NOTE — Progress Notes (Signed)
Physical Therapy Treatment Patient Details Name: Marie Woods MRN: [id number] DOB: [birth year] Today's Date: 01/25/2019    History of Present Illness Patient is a 63 year old female with medical history significant of asthma, hypertension, alcohol abuse who was brought in by EMS after neighbors called in stating that patient is confused and altered. Patient admitted Severe Sepsis with Acute metabolic encephalopathy secondary to UTI.     PT Comments    Pt was seen for movement on RW, with cues and reminders for safety with RW and with awareness of obstacles.  Pt is expecting to go to rehab but continues to need reminding, will not be able to walk in the room presently without cues.  When PT returned pt to room, lifted bedrails and used bed alarm to avoid safety issues. Telemonitor in room as well.   Follow Up Recommendations  SNF     Equipment Recommendations  Rolling walker with 5" wheels    Recommendations for Other Services       Precautions / Restrictions Precautions Precautions: Fall Precaution Comments: mild confusion Restrictions Weight Bearing Restrictions: No    Mobility  Bed Mobility Overal bed mobility: Modified Independent Bed Mobility: Supine to Sit     Supine to sit: Modified independent (Device/Increase time)     General bed mobility comments: up to EOB with no help  Transfers Overall transfer level: Needs assistance Equipment used: Rolling walker (2 wheeled);1 person hand held assist Transfers: Sit to/from Stand Sit to Stand: Supervision;Min guard         General transfer comment: requires no help to stand directly but supervised for safety  Ambulation/Gait Ambulation/Gait assistance: Min guard Gait Distance (Feet): 450 Feet Assistive device: Rolling walker (2 wheeled) Gait Pattern/deviations: Step-through pattern;Wide base of support;Drifts right/left;Decreased stride length Gait velocity: decreased   General Gait Details: more controlled  today, calmer, able to use walker with minor consistent reminders to keep it away from [employer]             Wheelchair Mobility    Modified Rankin (Stroke Patients Only)       Balance Overall balance assessment: Needs assistance Sitting-balance support: Feet supported Sitting balance-Leahy Scale: Good     Standing balance support: Bilateral upper extremity supported;During functional activity Standing balance-Leahy Scale: Fair Standing balance comment: fair with no walker                             Cognition Arousal/Alertness: Awake/alert Behavior During Therapy: Impulsive Overall Cognitive Status: No family/caregiver present to determine baseline cognitive functioning Area of Impairment: Problem solving;Awareness;Safety/judgement;Following commands;Attention                   Current Attention Level: Selective Memory: Decreased short-term memory Following Commands: Follows one step commands inconsistently;Follows one step commands with increased time Safety/Judgement: Decreased awareness of safety;Decreased awareness of deficits Awareness: Intellectual Problem Solving: Slow processing;Requires verbal cues General Comments: more aware of obstacles today      Exercises General Exercises - Lower Extremity Long Arc Quad: Strengthening;10 reps Heel Slides: Strengthening;10 reps    General Comments General comments (skin integrity, edema, etc.): pt is demonstrating one incidence of unsafe movement on walker, with a fast turn and awkward maneuvering on RW      Pertinent Vitals/Pain Pain Assessment: No/denies pain    Home Living  Prior Function            PT Goals (current goals can now be found in the care plan section) Acute Rehab PT Goals Patient Stated Goal: return to PLOF Progress towards PT goals: Progressing toward goals    Frequency    Min 3X/week      PT Plan Current plan remains  appropriate    Co-evaluation              AM-PAC PT "6 Clicks" Mobility   Outcome Measure  Help needed turning from your back to your side while in a flat bed without using bedrails?: None Help needed moving from lying on your back to sitting on the side of a flat bed without using bedrails?: None Help needed moving to and from a bed to a chair (including a wheelchair)?: A Little Help needed standing up from a chair using your arms (e.g., wheelchair or bedside chair)?: A Little Help needed to walk in hospital room?: A Little Help needed climbing 3-5 steps with a railing? : A Lot 6 Click Score: 19    End of Session Equipment Utilized During Treatment: Gait belt Activity Tolerance: Patient tolerated treatment well Patient left: in bed;with call bell/phone within reach(sitting side of bed to get her hair done) Nurse Communication: Mobility status PT Visit Diagnosis: Other abnormalities of gait and mobility (R26.89);Muscle weakness (generalized) (M62.81);Difficulty in walking, not elsewhere classified (R26.2)     Time: 0347-4259 PT Time Calculation (min) (ACUTE ONLY): 27 min  Charges:  $Gait Training: 8-22 mins $Therapeutic Exercise: 8-22 mins                   Ivar Drape 01/25/2019, 3:22 PM   Samul Dada, PT MS Acute Rehab Dept. Number: [facility] [phone] and [facility] [phone]

## 2019-01-25 NOTE — Progress Notes (Signed)
[facility] PROGRESS NOTE  Marie Woods GGY:[id number] DOB: 1955-09-09 DOA: 12/13/2018 PCP: Ladell Pier, MD  Brief summary   63 year old female with history of asthma, hypertension, alcohol abuse was brought to the hospital confused and altered.  She was found to have UTI; urine culture showed multiple species.  MRI brain was suggestive of possible Wernicke's encephalopathy.  Treated with thiamine.  PT recommended SNF placement.  Assessment/Plan:  sSpsis: Present on admission. UTI -Urine culture showed multiple species.  Repeat culture showed less than 10,000 colonies.  Was initially treated with IV Rocephin and fluids.  Subsequently antibiotics have been discontinued. -Currently afebrile and hemodynamically stable.  Sepsis has resolved.  Chest x-ray was negative for infection.  Acute metabolic encephalopathy. Possible Wernicke's encephalopathy.  -CT of the head showed mild atrophic changes without acute intracranial abnormality -MRI of the brain: Symmetric diffusion weighted and T2/FLAIR hyperintense signal abnormality within the medial thalami. Findings favor sequelae of Wernicke's encephalopathy There was question of patient having alcohol abuse however alcohol level less than 10. Urine drug screen negative -Completed IV thiamine.  Currently on oral thiamine now.  -Continue folic acid, multivitamin. -Outpatient follow-up with neurology. -Mental status still fluctuating. Suspect underlying dementia with memory issues, brain atrophy on imaging   Essential hypertension -Continue Norvasc.  History of alcohol abuse -Continue folate, thiamine and multivitamin.  Continue CIWA protocol  History of tobacco abuse -Continue nicotine patch  Left lower extremity rash, concerns for fungal infection -On ketoconazole. Eucerin cream.  Generalized deconditioning. Bedbug -Bedbug found on patient on presentation. -PT/OT recommending SNF.  Social worker following. -Feel  that patient does need supervision as she appears to have short-term memory impairment. We discuss the reason for her hospitalization on a daily basis but she does not remember the next day.  Code Status: full Family Communication: d/w patient, RN (indicate person spoken with, relationship, and if by phone, the number) Disposition Plan: needs placement    Consultants:  Neurology   Procedures:  none  Antibiotics: Anti-infectives (From admission, onward)   Start     Dose/Rate Route Frequency Ordered Stop   12/14/18 0100  cefTRIAXone (ROCEPHIN) 1 g in sodium chloride 0.9 % 100 mL IVPB  Status:  Discontinued     1 g 200 mL/hr over 30 Minutes Intravenous Every 24 hours 12/14/18 0010 12/17/18 0948        (indicate start date, and stop date if known)  HPI/Subjective: No acute distress. Poor historian. Suspect underlying dementia   Objective: Vitals:   01/24/19 2116 01/25/19 0728  BP: 105/74 114/73  Pulse: 79 84  Resp:    Temp: 98.1 F (36.7 C) 97.8 F (36.6 C)  SpO2: 99% 99%   No intake or output data in the 24 hours ending 01/25/19 1247 Filed Weights   12/15/18 0101  Weight: 98.4 kg    Exam:   General:  No distress   Cardiovascular: s1,s2 rrr  Respiratory: no wheezing   Abdomen: soft, nt   Musculoskeletal: no leg edema    Data Reviewed: Basic Metabolic Panel: Recent Labs  Lab 01/24/19 0525  CREATININE 0.98   Liver Function Tests: No results for input(s): AST, ALT, ALKPHOS, BILITOT, PROT, ALBUMIN in the last 168 hours. No results for input(s): LIPASE, AMYLASE in the last 168 hours. No results for input(s): AMMONIA in the last 168 hours. CBC: No results for input(s): WBC, NEUTROABS, HGB, HCT, MCV, PLT in the last 168 hours. Cardiac Enzymes: No results for input(s): CKTOTAL, CKMB, CKMBINDEX, TROPONINI in the  last 168 hours. BNP (last 3 results) No results for input(s): BNP in the last 8760 hours.  ProBNP (last 3 results) No results for input(s):  PROBNP in the last 8760 hours.  CBG: No results for input(s): GLUCAP in the last 168 hours.  No results found for this or any previous visit (from the past 240 hour(s)).   Studies: No results found.  Scheduled Meds: . amLODipine  5 mg Oral Daily  . enoxaparin (LOVENOX) injection  40 mg Subcutaneous Q24H  . feeding supplement (ENSURE ENLIVE)  237 mL Oral BID BM  . folic acid  1 mg Oral Daily  . ketoconazole   Topical BID  . multivitamin with minerals  1 tablet Oral Daily  . nicotine  21 mg Transdermal Daily  . thiamine  100 mg Oral Daily   Continuous Infusions:  Principal Problem:   AMS (altered mental status) Active Problems:   History of alcohol use   TOBACCO ABUSE   Essential hypertension    Time spent: >25 minutes     Esperanza Sheets  [facility] Pager [phone]. If 7PM-7AM, please contact night-coverage at [email or web address], password [facility] 01/25/2019, 12:47 PM  LOS: 43 days

## 2019-01-26 DIAGNOSIS — R410 Disorientation, unspecified: Secondary | ICD-10-CM

## 2019-01-26 NOTE — TOC Progression Note (Signed)
Transition of Care [facility]) - Progression Note    Patient Details  Name: Marie Woods MRN: [id number] Date of Birth: 08/13/1955  [facility]) CM/SW [place], LCSW Phone Number: 01/26/2019, 9:16 AM  Clinical Narrative:    CSW discussed case with [facility] Director and Physician Advisor. They are reaching out to Financial Counseling to see if there is any other diagnosis that could qualify patient for Medicaid/Disability, which is the barrier for SNF placement.    Expected Discharge Plan: Skilled Nursing Facility Barriers to Discharge: No SNF bed, SNF Pending payor source - LOG  Expected Discharge Plan and Services Expected Discharge Plan: [place] In-house Referral: Clinical Social Work Discharge Planning Services: NA Post Acute Care Choice: [place] Living arrangements for the past 2 months: Apartment                 DME Arranged: N/A DME Agency: NA                   Social Determinants of Health (SDOH) Interventions    Readmission Risk Interventions No flowsheet data found.

## 2019-01-26 NOTE — Progress Notes (Signed)
[facility] PROGRESS NOTE  Marie Woods NID:[id number] DOB: 09-03-1955 DOA: 12/13/2018 PCP: Marcine Matar, MD  Brief summary   63 year old female with history of asthma, hypertension, alcohol abuse was brought to the hospital confused and altered.  She was found to have UTI; urine culture showed multiple species.  MRI brain was suggestive of possible Wernicke's encephalopathy.  Treated with thiamine.  PT recommended SNF placement.  Assessment/Plan:  Sepsis: Present on admission. UTI -Urine culture showed multiple species.  Repeat culture showed less than 10,000 colonies.  Was initially treated with IV Rocephin and fluids.  Subsequently antibiotics have been discontinued. -Currently afebrile and hemodynamically stable.  Sepsis has resolved.  Chest x-ray was negative for infection.  Acute metabolic encephalopathy. Possible Wernicke's encephalopathy.  -CT of the head showed mild atrophic changes without acute intracranial abnormality -MRI of the brain: Symmetric diffusion weighted and T2/FLAIR hyperintense signal abnormality within the medial thalami. Findings favor sequelae of Wernicke's encephalopathy There was question of patient having alcohol abuse however alcohol level less than 10. Urine drug screen negative -Completed IV thiamine.  Currently on oral thiamine now.  -Continue folic acid, multivitamin. -Outpatient follow-up with neurology. -Mental status still fluctuating. Suspect underlying dementia with memory issues, brain atrophy on imaging   Essential hypertension -Continue Norvasc.  History of alcohol abuse -Continue folate, thiamine and multivitamin.  Continue CIWA protocol  History of tobacco abuse -Continue nicotine patch  Left lower extremity rash, concerns for fungal infection -On ketoconazole. Eucerin cream.  Generalized deconditioning. -PT/OT recommending SNF.  Social worker following. -Feel that patient does need supervision as she appears  to have short-term memory impairment. Reason for her hospitalization is discussed w/ patient on a daily basis but she reportedly does not remember the next day.  Code Status: full Family Communication: d/w patient, RN (indicate person spoken with, relationship, and if by phone, the number) Disposition Plan: needs placement    Consultants:  Neurology   Procedures:  none  Antibiotics: Anti-infectives (From admission, onward)   Start     Dose/Rate Route Frequency Ordered Stop   12/14/18 0100  cefTRIAXone (ROCEPHIN) 1 g in sodium chloride 0.9 % 100 mL IVPB  Status:  Discontinued     1 g 200 mL/hr over 30 Minutes Intravenous Every 24 hours 12/14/18 0010 12/17/18 0948       (indicate start date, and stop date if known)  HPI/Subjective: No acute distress. Poor historian. Suspect underlying dementia   Objective: Vitals:   01/25/19 2301 01/26/19 0835  BP: 101/74 112/81  Pulse: 84 76  Resp: 20 18  Temp: 98.2 F (36.8 C) 98.4 F (36.9 C)  SpO2: 98% 100%    Intake/Output Summary (Last 24 hours) at 01/26/2019 1212 Last data filed at 01/25/2019 2200 Gross per 24 hour  Intake 240 ml  Output --  Net 240 ml   Filed Weights   12/15/18 0101  Weight: 98.4 kg    Exam:   General:  No distress   Cardiovascular: s1,s2 rrr  Respiratory: no wheezing   Abdomen: soft, nt   Musculoskeletal: no leg edema    Data Reviewed: Basic Metabolic Panel: Recent Labs  Lab 01/24/19 0525  CREATININE 0.98   Liver Function Tests: No results for input(s): AST, ALT, ALKPHOS, BILITOT, PROT, ALBUMIN in the last 168 hours. No results for input(s): LIPASE, AMYLASE in the last 168 hours. No results for input(s): AMMONIA in the last 168 hours. CBC: No results for input(s): WBC, NEUTROABS, HGB, HCT, MCV, PLT in the last  168 hours. Cardiac Enzymes: No results for input(s): CKTOTAL, CKMB, CKMBINDEX, TROPONINI in the last 168 hours. BNP (last 3 results) No results for input(s): BNP in the  last 8760 hours.  ProBNP (last 3 results) No results for input(s): PROBNP in the last 8760 hours.  CBG: No results for input(s): GLUCAP in the last 168 hours.  No results found for this or any previous visit (from the past 240 hour(s)).   Studies: No results found.  Scheduled Meds: . amLODipine  5 mg Oral Daily  . enoxaparin (LOVENOX) injection  40 mg Subcutaneous Q24H  . feeding supplement (ENSURE ENLIVE)  237 mL Oral BID BM  . folic acid  1 mg Oral Daily  . ketoconazole   Topical BID  . multivitamin with minerals  1 tablet Oral Daily  . nicotine  21 mg Transdermal Daily  . thiamine  100 mg Oral Daily   Continuous Infusions:  Principal Problem:   AMS (altered mental status) Active Problems:   History of alcohol use   TOBACCO ABUSE   Essential hypertension    Time spent: >25 minutes     Emeterio Reeve  [facility]  01/26/2019, 12:12 PM  LOS: 44 days

## 2019-01-27 MED ORDER — DIPHENHYDRAMINE HCL 25 MG PO CAPS
25.0000 mg | ORAL_CAPSULE | Freq: Three times a day (TID) | ORAL | Status: DC | PRN
  Administered 2019-01-30 – 2019-02-15 (×5): 25 mg via ORAL
  Filled 2019-01-27 (×5): qty 1

## 2019-01-27 MED ORDER — CAMPHOR-MENTHOL 0.5-0.5 % EX LOTN
TOPICAL_LOTION | CUTANEOUS | Status: DC | PRN
  Filled 2019-01-27 (×3): qty 222

## 2019-01-27 NOTE — Progress Notes (Addendum)
[facility] PROGRESS NOTE  Marie Woods VOH:[id number] DOB: [birth year] DOA: 12/13/2018 PCP: Ladell Pier, MD  Brief summary   63 year old female with history of asthma, hypertension, alcohol abuse was brought to the hospital confused and altered.  She was found to have UTI; urine culture showed multiple species.  MRI brain was suggestive of possible Wernicke's encephalopathy.  Treated with thiamine.  PT recommended SNF placement.  Today 01/27/19 patient concerned about dry itching skin. Camphor-menthol lotion ordered.    Assessment/Plan:  Sepsis: Present on admission. UTI -Urine culture showed multiple species.  Repeat culture showed less than 10,000 colonies.  Was initially treated with IV Rocephin and fluids.  Subsequently antibiotics have been discontinued. -Currently afebrile and hemodynamically stable.  Sepsis has resolved.  Chest x-ray was negative for infection.  Acute metabolic encephalopathy. Possible Wernicke's encephalopathy.  -CT of the head showed mild atrophic changes without acute intracranial abnormality -MRI of the brain: Symmetric diffusion weighted and T2/FLAIR hyperintense signal abnormality within the medial thalami. Findings favor sequelae of Wernicke's encephalopathy There was question of patient having alcohol abuse however alcohol level less than 10. Urine drug screen negative -Completed IV thiamine.  Currently on oral thiamine now.  -Continue folic acid, multivitamin. -Outpatient follow-up with neurology. -Mental status still fluctuating. Suspect underlying dementia with memory issues, brain atrophy on imaging   Essential hypertension -Continue Norvasc.  History of alcohol abuse -Continue folate, thiamine and multivitamin.  Continue CIWA protocol  History of tobacco abuse -Continue nicotine patch  Left lower extremity rash, concerns for fungal infection -On ketoconazole. Eucerin cream. -Today 01/27/19 patient concerned about dry  itching skin. Camphor-menthol lotion ordered.    Generalized deconditioning. -PT/OT recommending SNF.  Social worker following. -Feel that patient does need supervision as she appears to have short-term memory impairment. Reason for her hospitalization is discussed w/ patient on a daily basis but she does not remember the next day.  Code Status: full Family Communication: d/w patient, RN (indicate person spoken with, relationship, and if by phone, the number) Disposition Plan: needs placement    Consultants:  Neurology   Procedures:  none  Antibiotics: Anti-infectives (From admission, onward)   Start     Dose/Rate Route Frequency Ordered Stop   12/14/18 0100  cefTRIAXone (ROCEPHIN) 1 g in sodium chloride 0.9 % 100 mL IVPB  Status:  Discontinued     1 g 200 mL/hr over 30 Minutes Intravenous Every 24 hours 12/14/18 0010 12/17/18 0948       (indicate start date, and stop date if known)  HPI/Subjective: No acute distress. Poor historian. Suspect underlying dementia   Objective: Vitals:   01/27/19 0834 01/27/19 1646  BP: 108/73 96/76  Pulse: 69 82  Resp: 16 18  Temp: (!) 97.5 F (36.4 C) 98.2 F (36.8 C)  SpO2: 100% 100%    Intake/Output Summary (Last 24 hours) at 01/27/2019 1717 Last data filed at 01/27/2019 0500 Gross per 24 hour  Intake 240 ml  Output --  Net 240 ml   Filed Weights   12/15/18 0101  Weight: 98.4 kg    Exam:   General:  No distress   Cardiovascular: s1,s2 rrr  Respiratory: no wheezing   Abdomen: soft, nt   Musculoskeletal: no leg edema    Data Reviewed: Basic Metabolic Panel: Recent Labs  Lab 01/24/19 0525  CREATININE 0.98   Liver Function Tests: No results for input(s): AST, ALT, ALKPHOS, BILITOT, PROT, ALBUMIN in the last 168 hours. No results for input(s): LIPASE, AMYLASE in the  last 168 hours. No results for input(s): AMMONIA in the last 168 hours. CBC: No results for input(s): WBC, NEUTROABS, HGB, HCT, MCV, PLT in  the last 168 hours. Cardiac Enzymes: No results for input(s): CKTOTAL, CKMB, CKMBINDEX, TROPONINI in the last 168 hours. BNP (last 3 results) No results for input(s): BNP in the last 8760 hours.  ProBNP (last 3 results) No results for input(s): PROBNP in the last 8760 hours.  CBG: No results for input(s): GLUCAP in the last 168 hours.  No results found for this or any previous visit (from the past 240 hour(s)).   Studies: No results found.  Scheduled Meds: . amLODipine  5 mg Oral Daily  . enoxaparin (LOVENOX) injection  40 mg Subcutaneous Q24H  . feeding supplement (ENSURE ENLIVE)  237 mL Oral BID BM  . folic acid  1 mg Oral Daily  . ketoconazole   Topical BID  . multivitamin with minerals  1 tablet Oral Daily  . nicotine  21 mg Transdermal Daily  . thiamine  100 mg Oral Daily   Continuous Infusions:  Principal Problem:   AMS (altered mental status) Active Problems:   History of alcohol use   TOBACCO ABUSE   Essential hypertension   Confusion    Time spent: >15 minutes     Marie Woods  [facility]  01/27/2019, 5:17 PM  LOS: 45 days

## 2019-01-27 NOTE — Progress Notes (Signed)
Pt asked for coffee, also brought pt cream and sugar, cereal with milk to encourage eating. Pt states I'm allergic to milk after drinking half bottle of ensure. This nurse reminded pt that no allergies were listed and did patient feel that was a new allergy. Pt stated that the chart could say whatever it wanted to say. Pt stated she eats a lot of products with milk in it but when she drinks straight milk it breaks her out into bumps. Ask patient if she would like this nurse to add it in as an allergy, pt stated yes.

## 2019-01-28 NOTE — Progress Notes (Signed)
PROGRESS NOTE    Marie Woods  GQQ:[id number] DOB: [birth year] DOA: 12/13/2018 PCP: Marcine Matar, MD   Brief Narrative:  63 year old female with history of asthma, hypertension, alcohol abuse was brought to the hospital confused and altered. She was found to have UTI; urine culture showed multiple species. MRI brain was suggestive of possible Wernicke's encephalopathy. Treated with thiamine. PT recommended SNF placement.  Today 01/28/19 patient concerned about room temperature being too cold and she wants to go home. The food has no taste.  Assessment & Plan:   Sepsis: Present on admission. UTI -Urine culture showed multiple species. Repeat culture showed less than 10,000 colonies. Was initially treated with IV Rocephin and fluids. Subsequently antibiotics have been discontinued. -Currentlyafebrile andhemodynamically stable. Sepsis has resolved. Chest x-ray was negative for infection.  Acute metabolic encephalopathy. Possible Wernicke's encephalopathy.  -CT of the head showed mild atrophic changes without acute intracranial abnormality -MRI of the brain: Symmetric diffusion weighted and T2/FLAIR hyperintense signal abnormality within the medial thalami. Findings favor sequelae of Wernicke's encephalopathy There was question of patient having alcohol abuse however alcohol level less than 10. Urine drug screen negative -Completed IV thiamine. Currently on oral thiamine now.  -Continue folic acid, multivitamin. -Outpatient follow-up with neurology. -Mental status still fluctuating. Suspect underlying dementia with memory issues, brain atrophy on imaging   Essential hypertension -Continue Norvasc.  History of alcohol abuse -Continue folate, thiamine and multivitamin. Continue CIWA protocol  History of tobacco abuse -Continue nicotine patch  Left lower extremity rash, concerns for fungal infection -On ketoconazole. Eucerin cream. -Today 01/27/19 patient  concerned about dry itching skin. Camphor-menthol lotion ordered.    Generalized deconditioning. -PT/OT recommending SNF. Social worker following. -Feel that patient does need supervision as she appears to have short-term memory impairment. Reason for her hospitalization is discussed w/ patient on a daily basis but she does not remember the next day.  DVT prophylaxis: Lovenox Code Status: Full Family Communication: patient Disposition Plan: awaiting placement  Consultants:   Neurology  Procedures:   none  Antimicrobials: Anti-infectives (From admission, onward)   Start     Dose/Rate Route Frequency Ordered Stop   12/14/18 0100  cefTRIAXone (ROCEPHIN) 1 g in sodium chloride 0.9 % 100 mL IVPB  Status:  Discontinued     1 g 200 mL/hr over 30 Minutes Intravenous Every 24 hours 12/14/18 0010 12/17/18 0948     Subjective: Patient is stable. No new issues.  Objective: Vitals:   01/27/19 0834 01/27/19 1646 01/27/19 2308 01/28/19 0749  BP: 108/73 96/76 104/70 101/73  Pulse: 69 82 79 78  Resp: 16 18 13 19   Temp: (!) 97.5 F (36.4 C) 98.2 F (36.8 C) 98.5 F (36.9 C) 98 F (36.7 C)  TempSrc: Oral Oral Oral Oral  SpO2: 100% 100% 97% 96%  Weight:      Height:       No intake or output data in the 24 hours ending 01/28/19 1634 Filed Weights   12/15/18 0101  Weight: 98.4 kg    Examination:  General exam: Appears calm and comfortable  Respiratory system: Clear to auscultation. Respiratory effort normal. Cardiovascular system: S1 & S2 heard, RRR.  Gastrointestinal system: Abdomen is nondistended, soft and nontender.  Central nervous system: Alert and confused Extremities: Symmetric 5 x 5 power. Skin: No rashes, lesions or ulcers Psychiatry: Judgement and insight appear poor    Data Reviewed: I have personally reviewed following labs and imaging studies  CBC: No results for input(s): WBC, NEUTROABS, HGB,  HCT, MCV, PLT in the last 168 hours. Basic Metabolic  Panel: Recent Labs  Lab 01/24/19 0525  CREATININE 0.98   GFR: Estimated Creatinine Clearance: 72.6 mL/min (by C-G formula based on SCr of 0.98 mg/dL). Urine analysis:    Component Value Date/Time   COLORURINE AMBER (A) 12/13/2018 2218   APPEARANCEUR HAZY (A) 12/13/2018 2218   LABSPEC 1.027 12/13/2018 2218   PHURINE 5.0 12/13/2018 2218   GLUCOSEU NEGATIVE 12/13/2018 2218   HGBUR SMALL (A) 12/13/2018 2218   HGBUR small 06/28/2009 1504   BILIRUBINUR SMALL (A) 12/13/2018 2218   KETONESUR 5 (A) 12/13/2018 2218   PROTEINUR 30 (A) 12/13/2018 2218   UROBILINOGEN 1.0 09/28/2014 1426   NITRITE NEGATIVE 12/13/2018 2218   LEUKOCYTESUR TRACE (A) 12/13/2018 2218    Scheduled Meds: . amLODipine  5 mg Oral Daily  . enoxaparin (LOVENOX) injection  40 mg Subcutaneous Q24H  . feeding supplement (ENSURE ENLIVE)  237 mL Oral BID BM  . folic acid  1 mg Oral Daily  . ketoconazole   Topical BID  . multivitamin with minerals  1 tablet Oral Daily  . nicotine  21 mg Transdermal Daily  . thiamine  100 mg Oral Daily     LOS: 46 days   Time spent: 14 minutes  Donnamae Jude, MD [facility] Pager [phone]  If 7PM-7AM, please contact night-coverage [email or web address] Password [facility] 01/28/2019, 4:34 PM

## 2019-01-29 MED ORDER — HYDROXYZINE HCL 25 MG PO TABS
25.0000 mg | ORAL_TABLET | Freq: Two times a day (BID) | ORAL | Status: DC | PRN
  Administered 2019-01-29 – 2019-05-06 (×27): 25 mg via ORAL
  Filled 2019-01-29 (×30): qty 1

## 2019-01-29 MED ORDER — HYDROXYZINE PAMOATE 25 MG PO CAPS: 25.0000 mg | ORAL_CAPSULE | Freq: Two times a day (BID) | ORAL | Status: DC | PRN

## 2019-01-29 NOTE — Progress Notes (Signed)
PROGRESS NOTE    Marie Woods  TTS:[id number] DOB: July 22, 1955 DOA: 12/13/2018 PCP: Marcine Matar, MD   Brief Narrative:  63 year old female with history of asthma, hypertension, alcohol abuse was brought to the hospital confused and altered. She was found to have UTI; urine culture showed multiple species. MRI brain was suggestive of possible Wernicke's encephalopathy. Treated with thiamine. PT recommended SNF placement.   Assessment & Plan:   Sepsis: Present on admission. UTI -Urine culture showed multiple species. Repeat culture showed less than 10,000 colonies. Was initially treated with IV Rocephin and fluids. Subsequently antibiotics have been discontinued. -Currentlyafebrile andhemodynamically stable. Sepsis has resolved. Chest x-ray was negative for infection.  Acute metabolic encephalopathy. Possible Wernicke's encephalopathy.  -CT of the head showed mild atrophic changes without acute intracranial abnormality -MRI of the brain: Symmetric diffusion weighted and T2/FLAIR hyperintense signal abnormality within the medial thalami. Findings favor sequelae of Wernicke's encephalopathy There was question of patient having alcohol abuse however alcohol level less than 10. Urine drug screen negative -Completed IV thiamine. Currently on oral thiamine now.  -Continue folic acid, multivitamin. -Outpatient follow-up with neurology. -Mental status still fluctuating. Suspect underlying dementia with memory issues, brain atrophy on imaging   Essential hypertension -Continue Norvasc  History of alcohol abuse -Continue folate, thiamine and multivitamin -at 46 days clearly past the window for withdrawal  History of tobacco abuse -Continue nicotine patch  Generalized deconditioning. -PT/OT recommending SNF. Social worker following. -Feel that patient does need supervision as she appears to have short-term memory impairment. Reason for her hospitalization is  discussed w/ patient on a daily basis but she does not remember the next day.   DVT prophylaxis: Lovenox Code Status: Full Disposition Plan: awaiting placement/safe d/c plan   Consultants:   Neurology    Antimicrobials: Anti-infectives (From admission, onward)   Start     Dose/Rate Route Frequency Ordered Stop   12/14/18 0100  cefTRIAXone (ROCEPHIN) 1 g in sodium chloride 0.9 % 100 mL IVPB  Status:  Discontinued     1 g 200 mL/hr over 30 Minutes Intravenous Every 24 hours 12/14/18 0010 12/17/18 0948     Subjective: No overnight events  Objective: Vitals:   01/28/19 0749 01/28/19 1930 01/28/19 2300 01/29/19 0711  BP: 101/73 102/70 112/68 107/74  Pulse: 78 78 76 80  Resp: 19 14 18 17   Temp: 98 F (36.7 C) 97.8 F (36.6 C) 97.8 F (36.6 C) 98.4 F (36.9 C)  TempSrc: Oral Oral Oral Oral  SpO2: 96% 97% 97% 98%  Weight:      Height:       No intake or output data in the 24 hours ending 01/29/19 1129 Filed Weights   12/15/18 0101  Weight: 98.4 kg    Examination:  General exam: in bed, NAD Respiratory system: clear, no wheezing Cardiovascular system: rrr Gastrointestinal system: +BS, soft, NT Extremities: moves all 4 ext Psychiatry: cooperative but limited understanding of medical issues    Data Reviewed: I have personally reviewed following labs and imaging studies  CBC: No results for input(s): WBC, NEUTROABS, HGB, HCT, MCV, PLT in the last 168 hours. Basic Metabolic Panel: Recent Labs  Lab 01/24/19 0525  CREATININE 0.98   GFR: Estimated Creatinine Clearance: 72.6 mL/min (by C-G formula based on SCr of 0.98 mg/dL). Urine analysis:    Component Value Date/Time   COLORURINE AMBER (A) 12/13/2018 2218   APPEARANCEUR HAZY (A) 12/13/2018 2218   LABSPEC 1.027 12/13/2018 2218   PHURINE 5.0 12/13/2018 2218  GLUCOSEU NEGATIVE 12/13/2018 2218   HGBUR SMALL (A) 12/13/2018 2218   HGBUR small 06/28/2009 1504   BILIRUBINUR SMALL (A) 12/13/2018 2218    KETONESUR 5 (A) 12/13/2018 2218   PROTEINUR 30 (A) 12/13/2018 2218   UROBILINOGEN 1.0 09/28/2014 1426   NITRITE NEGATIVE 12/13/2018 2218   LEUKOCYTESUR TRACE (A) 12/13/2018 2218    Scheduled Meds: . amLODipine  5 mg Oral Daily  . enoxaparin (LOVENOX) injection  40 mg Subcutaneous Q24H  . feeding supplement (ENSURE ENLIVE)  237 mL Oral BID BM  . folic acid  1 mg Oral Daily  . ketoconazole   Topical BID  . multivitamin with minerals  1 tablet Oral Daily  . nicotine  21 mg Transdermal Daily  . thiamine  100 mg Oral Daily     LOS: 47 days   Time spent: 14 minutes  Geradine Girt, DO [facility]   If 7PM-7AM, please contact night-coverage [email or web address] Password [facility] 01/29/2019, 11:29 AM

## 2019-01-30 NOTE — Progress Notes (Addendum)
PROGRESS NOTE    Marie Woods  ASN:[id number] DOB: [birth year] DOA: 12/13/2018 PCP: Ladell Pier, MD   Brief Narrative:  63 year old female with history of asthma, hypertension, alcohol abuse was brought to the hospital confused and altered. She was found to have UTI; urine culture showed multiple species. MRI brain was suggestive of possible Wernicke's encephalopathy. Treated with thiamine. PT recommended SNF placement.   Assessment & Plan:   Sepsis: Present on admission. UTI/resolved -Urine culture showed multiple species. Repeat culture showed less than 10,000 colonies. Was initially treated with IV Rocephin and fluids. Subsequently antibiotics have been discontinued. -Currentlyafebrile andhemodynamically stable. Sepsis has resolved. Chest x-ray was negative for infection.  Acute metabolic encephalopathy. Possible Wernicke's encephalopathy.  -CT of the head showed mild atrophic changes without acute intracranial abnormality -MRI of the brain: Symmetric diffusion weighted and T2/FLAIR hyperintense signal abnormality within the medial thalami. Findings favor sequelae of Wernicke's encephalopathy There was question of patient having alcohol abuse however alcohol level less than 10. Urine drug screen negative -Completed IV thiamine. Currently on oral thiamine now.  -Continue folic acid, multivitamin. -Outpatient follow-up with neurology. -Mental status still fluctuating. Suspect underlying dementia with memory issues, brain atrophy on imaging   Essential hypertension -Continue Norvasc  History of alcohol abuse -Continue folate, thiamine and multivitamin -at 46 days clearly past the window for withdrawal  History of tobacco abuse -Continue nicotine patch  Generalized deconditioning. -PT/OT recommending SNF. Social worker following. -Feel that patient does need supervision as she appears to have short-term memory impairment. Reason for her hospitalization  is discussed w/ patient on a daily basis but she does not remember the next day.   DVT prophylaxis: Lovenox Code Status: Full Disposition Plan: awaiting placement/safe d/c plan   Consultants:   Neurology    Antimicrobials: Anti-infectives (From admission, onward)   Start     Dose/Rate Route Frequency Ordered Stop   12/14/18 0100  cefTRIAXone (ROCEPHIN) 1 g in sodium chloride 0.9 % 100 mL IVPB  Status:  Discontinued     1 g 200 mL/hr over 30 Minutes Intravenous Every 24 hours 12/14/18 0010 12/17/18 0948     Subjective: No overnight events  Objective: Vitals:   01/29/19 1429 01/29/19 1759 01/29/19 2334 01/30/19 0809  BP: 109/87 116/84 102/69 99/73  Pulse: 90 87 82 84  Resp: 19 19 14 18   Temp: 98.1 F (36.7 C) 98.7 F (37.1 C) 98.4 F (36.9 C) 97.9 F (36.6 C)  TempSrc: Oral  Oral Oral  SpO2: 100% 97% 100% 95%  Weight:      Height:        Intake/Output Summary (Last 24 hours) at 01/30/2019 1325 Last data filed at 01/30/2019 1100 Gross per 24 hour  Intake 240 ml  Output --  Net 240 ml   Filed Weights   12/15/18 0101  Weight: 98.4 kg    Examination:  Sitting on side of bed, NAD   Data Reviewed: I have personally reviewed following labs and imaging studies  CBC: No results for input(s): WBC, NEUTROABS, HGB, HCT, MCV, PLT in the last 168 hours. Basic Metabolic Panel: Recent Labs  Lab 01/24/19 0525  CREATININE 0.98   GFR: Estimated Creatinine Clearance: 72.6 mL/min (by C-G formula based on SCr of 0.98 mg/dL). Urine analysis:    Component Value Date/Time   COLORURINE AMBER (A) 12/13/2018 2218   APPEARANCEUR HAZY (A) 12/13/2018 2218   LABSPEC 1.027 12/13/2018 2218   PHURINE 5.0 12/13/2018 [place] 12/13/2018 2218  HGBUR SMALL (A) 12/13/2018 2218   HGBUR small 06/28/2009 1504   BILIRUBINUR SMALL (A) 12/13/2018 2218   KETONESUR 5 (A) 12/13/2018 2218   PROTEINUR 30 (A) 12/13/2018 2218   UROBILINOGEN 1.0 09/28/2014 1426   NITRITE  NEGATIVE 12/13/2018 2218   LEUKOCYTESUR TRACE (A) 12/13/2018 2218    Scheduled Meds: . amLODipine  5 mg Oral Daily  . enoxaparin (LOVENOX) injection  40 mg Subcutaneous Q24H  . feeding supplement (ENSURE ENLIVE)  237 mL Oral BID BM  . folic acid  1 mg Oral Daily  . ketoconazole   Topical BID  . multivitamin with minerals  1 tablet Oral Daily  . nicotine  21 mg Transdermal Daily  . thiamine  100 mg Oral Daily     LOS: 48 days   Time spent: 10 minutes  Joseph Art, DO [facility]   If 7PM-7AM, please contact night-coverage [email or web address] Password [email or web address] 01/30/2019, 1:25 PM

## 2019-01-30 NOTE — Progress Notes (Signed)
Physical Therapy Treatment Patient Details Name: Marie Woods MRN: [id number] DOB: Mar 02, 1955 Today's Date: 01/30/2019    History of Present Illness Patient is a 63 year old female with medical history significant of asthma, hypertension, alcohol abuse who was brought in by EMS after neighbors called in stating that patient is confused and altered. Patient admitted Severe Sepsis with Acute metabolic encephalopathy secondary to UTI.     PT Comments    Pt remains to have decreased insight to safety, poor short term memory, and impulsivity requiring pt to have 24/7 assist upon d/c. Pt did amb with RW today and was better at navigating around objects in hallways. Pt performed ADLs at sink without difficulty. Acute PT to cont to monitor.   Follow Up Recommendations  SNF     Equipment Recommendations  Rolling walker with 5" wheels    Recommendations for Other Services       Precautions / Restrictions Precautions Precautions: Fall Restrictions Weight Bearing Restrictions: No    Mobility  Bed Mobility Overal bed mobility: Modified Independent Bed Mobility: Supine to Sit;Sit to Supine     Supine to sit: Modified independent (Device/Increase time) Sit to supine: Modified independent (Device/Increase time)   General bed mobility comments: no difficulty, EOB with no help  Transfers Overall transfer level: Needs assistance Equipment used: None Transfers: Sit to/from Stand Sit to Stand: Min guard         General transfer comment: pt reaching for counter & objects to hold onto  Ambulation/Gait Ambulation/Gait assistance: Min guard Gait Distance (Feet): 450 Feet Assistive device: Rolling walker (2 wheeled) Gait Pattern/deviations: Step-through pattern;Wide base of support;Drifts right/left;Decreased stride length Gait velocity: decreased   General Gait Details: pt amb short distance in room without RW however pt with R drop foot and difficulty clearing so reaching for  things to hold onto. pt given RW and pt more controlled, steady, and safe. verbal cues to stay in walker and stand up straight, pt did better with navigating around obstacles in hallway   Stairs             Wheelchair Mobility    Modified Rankin (Stroke Patients Only)       Balance Overall balance assessment: Needs assistance Sitting-balance support: Feet supported Sitting balance-Leahy Scale: Good Sitting balance - Comments: static sitting balance EOB without LOB   Standing balance support: Bilateral upper extremity supported;During functional activity Standing balance-Leahy Scale: Fair Standing balance comment: pt stood at sink to wash face and brush teeth, pt did not need to hold onto sink or brace self up against sink.                             Cognition Arousal/Alertness: Awake/alert Behavior During Therapy: Impulsive Overall Cognitive Status: Impaired/Different from baseline                     Current Attention Level: Selective Memory: Decreased short-term memory Following Commands: Follows one step commands with increased time;Follows one step commands consistently;Follows multi-step commands inconsistently Safety/Judgement: Decreased awareness of safety Awareness: Emergent Problem Solving: Requires verbal cues;Requires tactile cues General Comments: pt with h/o bipolar and noted short term memory deficits which inhibits pt's ability to be indep in room      Exercises      General Comments General comments (skin integrity, edema, etc.): pt with noted dry skin, pt given lotion      Pertinent Vitals/Pain Pain Assessment: No/denies  pain    Home Living                      Prior Function            PT Goals (current goals can now be found in the care plan section) Progress towards PT goals: Progressing toward goals    Frequency    Min 3X/week      PT Plan Current plan remains appropriate    Co-evaluation               AM-PAC PT "6 Clicks" Mobility   Outcome Measure  Help needed turning from your back to your side while in a flat bed without using bedrails?: None Help needed moving from lying on your back to sitting on the side of a flat bed without using bedrails?: None Help needed moving to and from a bed to a chair (including a wheelchair)?: A Little Help needed standing up from a chair using your arms (e.g., wheelchair or bedside chair)?: A Little Help needed to walk in hospital room?: A Little Help needed climbing 3-5 steps with a railing? : A Little 6 Click Score: 20    End of Session Equipment Utilized During Treatment: Gait belt Activity Tolerance: Patient tolerated treatment well Patient left: in bed;with call bell/phone within reach;with bed alarm set Nurse Communication: Mobility status PT Visit Diagnosis: Other abnormalities of gait and mobility (R26.89);Muscle weakness (generalized) (M62.81);Difficulty in walking, not elsewhere classified (R26.2)     Time: 3244-0102 PT Time Calculation (min) (ACUTE ONLY): 16 min  Charges:  $Gait Training: 8-22 mins                     Lewis Shock, PT, DPT Acute Rehabilitation Services Pager #: [phone] Office #: [phone]    Iona Hansen 01/30/2019, 1:05 PM

## 2019-01-31 LAB — CREATININE, SERUM
Creatinine, Ser: 0.67 mg/dL (ref 0.44–1.00)
GFR calc Af Amer: >60 mL/min (ref >60)
GFR calc non Af Amer: >60 mL/min (ref >60)

## 2019-01-31 NOTE — Progress Notes (Signed)
Nutrition Follow-up  DOCUMENTATION CODES:   Obesity unspecified  INTERVENTION:   - Continue MVI with minerals daily  -Ensure Enlive po BID, each supplement provides 350 kcal and 20 grams of protein  NUTRITION DIAGNOSIS:   Increased nutrient needs related to acute illness as evidenced by estimated needs.  Ongoing.  GOAL:   Patient will meet greater than or equal to 90% of their needs  Progressing.  MONITOR:   PO intake, Supplement acceptance, Labs, Weight trends  ASSESSMENT:   63 year old female who presented to the ED on 11/10 with AMS. PMH of asthma, EtOH abuse, HTN. Pt admitted with severe sepsis secondary to UTI. Pt found to have Wernicke's encephalopathy.   **RD working remotely**  Per MD note, pt continues to be medically stable for discharge. Awaiting placement.  Ptcurrently consuming 50-60% of mealsand is drinking supplements that are ordered.   Admission weight: 217 lbs. No new weights since 11/12.   Labs reviewed. Medications: Folic acid tablet,Multivitamin with minerals daily, Thiamine tablet  Diet Order:   Diet Order            Diet Heart Room service appropriate? No; Fluid consistency: Thin  Diet effective now              EDUCATION NEEDS:   Not appropriate for education at this time  Skin:  Skin Assessment: Reviewed RN Assessment  Last BM:  12/19  Height:   Ht Readings from Last 1 Encounters:  12/15/18 5\' 7"  (1.702 m)    Weight:   Wt Readings from Last 1 Encounters:  12/15/18 98.4 kg    Ideal Body Weight:  61.4 kg  BMI:  Body mass index is 33.99 kg/m.  Estimated Nutritional Needs:   Kcal:  1900-2100  Protein:  95-110 grams  Fluid:  >/= 1.8 L  Clayton Bibles, MS, RD, LDN Inpatient Clinical Dietitian Pager: [phone] After Hours Pager: [phone]

## 2019-01-31 NOTE — Plan of Care (Signed)

## 2019-01-31 NOTE — Progress Notes (Signed)
PROGRESS NOTE    Marie Woods  DXA:[id number] DOB: 05-20-1955 DOA: 12/13/2018 PCP: Ladell Pier, MD   Brief Narrative:  63 year old female with history of asthma, hypertension, alcohol abuse was brought to the hospital confused and altered. She was found to have UTI; urine culture showed multiple species. MRI brain was suggestive of possible Wernicke's encephalopathy. Treated with thiamine. PT recommended SNF placement.  Now waiting for payor source/LOG.   Assessment & Plan:   Sepsis: Present on admission. UTI -Urine culture showed multiple species. Repeat culture showed less than 10,000 colonies. Was initially treated with IV Rocephin and fluids. Subsequently antibiotics have been discontinued. -Currentlyafebrile andhemodynamically stable. Sepsis has resolved. Chest x-ray was negative for infection.  Acute metabolic encephalopathy. Possible Wernicke's encephalopathy.  -CT of the head showed mild atrophic changes without acute intracranial abnormality -MRI of the brain: Symmetric diffusion weighted and T2/FLAIR hyperintense signal abnormality within the medial thalami. Findings favor sequelae of Wernicke's encephalopathy There was question of patient having alcohol abuse however alcohol level less than 10. Urine drug screen negative -Completed IV thiamine. Currently on oral thiamine now.  -Continue folic acid, multivitamin. -Outpatient follow-up with neurology. -Mental status still fluctuating. Suspect underlying dementia with memory issues, brain atrophy on imaging   Essential hypertension -Continue Norvasc  History of alcohol abuse -Continue folate, thiamine and multivitamin -at 46 days clearly past the window for withdrawal  History of tobacco abuse -Continue nicotine patch  Generalized deconditioning. -PT/OT recommending SNF. Social worker following. -Feel that patient does need supervision as she appears to have short-term memory impairment.  Reason for her hospitalization is discussed w/ patient on a daily basis but she does not remember the next day.   DVT prophylaxis: Lovenox Code Status: Full Disposition Plan: awaiting placement/safe d/c plan   Consultants:   Neurology    Antimicrobials: Anti-infectives (From admission, onward)   Start     Dose/Rate Route Frequency Ordered Stop   12/14/18 0100  cefTRIAXone (ROCEPHIN) 1 g in sodium chloride 0.9 % 100 mL IVPB  Status:  Discontinued     1 g 200 mL/hr over 30 Minutes Intravenous Every 24 hours 12/14/18 0010 12/17/18 0948     Subjective: No overnight events  Objective: Vitals:   01/30/19 0809 01/30/19 1804 01/30/19 2127 01/31/19 0859  BP: 99/73 122/87 122/85 112/79  Pulse: 84 84 82 96  Resp: 18 16 20    Temp: 97.9 F (36.6 C) 98 F (36.7 C) (!) 97.5 F (36.4 C) 98.2 F (36.8 C)  TempSrc: Oral Oral Oral Oral  SpO2: 95% 100% 98% 97%  Weight:      Height:        Intake/Output Summary (Last 24 hours) at 01/31/2019 1355 Last data filed at 01/31/2019 2094 Gross per 24 hour  Intake 600 ml  Output --  Net 600 ml   Filed Weights   12/15/18 0101  Weight: 98.4 kg    Examination:  Sitting on side of bed, talking with housekeeping   Data Reviewed: I have personally reviewed following labs and imaging studies  CBC: No results for input(s): WBC, NEUTROABS, HGB, HCT, MCV, PLT in the last 168 hours. Basic Metabolic Panel: Recent Labs  Lab 01/31/19 0415  CREATININE 0.67   GFR: Estimated Creatinine Clearance: 89 mL/min (by C-G formula based on SCr of 0.67 mg/dL). Urine analysis:    Component Value Date/Time   COLORURINE AMBER (A) 12/13/2018 2218   APPEARANCEUR HAZY (A) 12/13/2018 2218   LABSPEC 1.027 12/13/2018 2218   PHURINE 5.0  12/13/2018 2218   GLUCOSEU NEGATIVE 12/13/2018 2218   HGBUR SMALL (A) 12/13/2018 2218   HGBUR small 06/28/2009 1504   BILIRUBINUR SMALL (A) 12/13/2018 2218   KETONESUR 5 (A) 12/13/2018 2218   PROTEINUR 30 (A)  12/13/2018 2218   UROBILINOGEN 1.0 09/28/2014 1426   NITRITE NEGATIVE 12/13/2018 2218   LEUKOCYTESUR TRACE (A) 12/13/2018 2218    Scheduled Meds: . amLODipine  5 mg Oral Daily  . enoxaparin (LOVENOX) injection  40 mg Subcutaneous Q24H  . feeding supplement (ENSURE ENLIVE)  237 mL Oral BID BM  . folic acid  1 mg Oral Daily  . ketoconazole   Topical BID  . multivitamin with minerals  1 tablet Oral Daily  . nicotine  21 mg Transdermal Daily  . thiamine  100 mg Oral Daily     LOS: 49 days   Time spent: 10 minutes  Joseph Art, DO [facility]   If 7PM-7AM, please contact night-coverage [email or web address] Password [email or web address] 01/31/2019, 1:55 PM

## 2019-01-31 NOTE — TOC Progression Note (Signed)
Transition of Care [facility]) - Progression Note    Patient Details  Name: ROJEAN IGE MRN: [id number] Date of Birth: 22-Jun-1955  Transition of Care [facility]) CM/SW [place], LCSW Phone Number: 01/31/2019, 8:40 AM  Clinical Narrative:    CSW continuing to follow for needs.    Expected Discharge Plan: Skilled Nursing Facility Barriers to Discharge: No SNF bed, SNF Pending payor source - LOG  Expected Discharge Plan and Services Expected Discharge Plan: [place] In-house Referral: Clinical Social Work Discharge Planning Services: NA Post Acute Care Choice: [place] Living arrangements for the past 2 months: Apartment                 DME Arranged: N/A DME Agency: NA                   Social Determinants of Health (SDOH) Interventions    Readmission Risk Interventions No flowsheet data found.

## 2019-02-01 NOTE — Progress Notes (Signed)
[name], female   DOB: 1955-09-06, 63 y.o.   MRN: [id number]  PROGRESS NOTE    Marie Woods  CBS:[id number] DOB: 1955/04/23 DOA: 12/13/2018 PCP: Marcine Matar, MD   Brief Narrative:  63 year old female with history of asthma, hypertension, alcohol abuse was brought to the hospital confused and altered.  She was found to have UTI; urine culture showed multiple species.  MRI brain was suggestive of possible Wernicke's encephalopathy.  Treated with thiamine.  PT recommended SNF placement.  Assessment & Plan:   Severe sepsis: Present on admission UTI -Urine culture showed multiple species.  Repeat culture showed less than 10,000 colonies.  Was initially treated with IV Rocephin and fluids.  Subsequently antibiotics have been discontinued. -Currently afebrile and hemodynamically stable.  Sepsis has resolved.  Chest x-ray was negative for infection.  Acute metabolic encephalopathy Possible Wernicke's encephalopathy -CT of the head showed mild atrophic changes without acute intracranial abnormality -MRI of the brain: Symmetric diffusion weighted and T2/FLAIR hyperintense signal abnormality within the medial thalami.  Findings favor sequelae of Wernicke's encephalopathy There was question of patient having alcohol abuse however alcohol level less than 10.  Urine drug screen negative -Completed IV thiamine.  Currently on oral thiamine now.  -Continue folic acid, multivitamin. -Outpatient follow-up with neurology. -Mental status still fluctuating.  Suspect underlying dementia with memory issues.    Essential hypertension -Continue Norvasc.  History of alcohol abuse -Treated with CIWA protocol.  Continue folate, thiamine and multivitamin.   History of tobacco abuse -Continue nicotine patch   Generalized deconditioning -PT/OT recommending SNF.  Social worker following. -Feel that patient does need supervision as she appears to have short-term memory impairment.  We  discuss the reason for her hospitalization on a daily basis but she does not remember the next day.  DVT prophylaxis: Lovenox Code Status: Full Family Communication: None at bedside Disposition Plan: SNF once bed is available  Consultants: Neurology via phone  Procedures: None  Antimicrobials:  Anti-infectives (From admission, onward)   Start     Dose/Rate Route Frequency Ordered Stop   12/14/18 0100  cefTRIAXone (ROCEPHIN) 1 g in sodium chloride 0.9 % 100 mL IVPB  Status:  Discontinued     1 g 200 mL/hr over 30 Minutes Intravenous Every 24 hours 12/14/18 0010 12/17/18 0948       Subjective: Patient seen and examined at bedside.  Poor historian.  No overnight fever, vomiting reported by nursing staff.  Objective: Vitals:   01/31/19 0859 01/31/19 1747 01/31/19 2253 02/01/19 0842  BP: 112/79 130/85 122/83 (!) 101/57  Pulse: 96 79 83 88  Resp:   20   Temp: 98.2 F (36.8 C) (!) 97.2 F (36.2 C) 98.4 F (36.9 C) 98.5 F (36.9 C)  TempSrc: Oral Oral  Oral  SpO2: 97% 100% 99% 97%  Weight:      Height:       No intake or output data in the 24 hours ending 02/01/19 1144 Filed Weights   12/15/18 0101  Weight: 98.4 kg    Examination:  General exam: No distress.  Looks chronically ill.  Looks older than stated age.  Sleepy, hardly wakes up on calling her name. Respiratory system: Bilateral decreased breath sounds at bases   cardiovascular system: Rate controlled, S1-S2 heard Gastrointestinal system: Abdomen is nondistended, soft and nontender. Normal bowel sounds heard.  Data Reviewed: I have personally reviewed following labs and imaging studies  CBC: No results for input(s): WBC, NEUTROABS, HGB, HCT, MCV,  PLT in the last 168 hours. Basic Metabolic Panel: Recent Labs  Lab 01/31/19 0415  CREATININE 0.67   GFR: Estimated Creatinine Clearance: 89 mL/min (by C-G formula based on SCr of 0.67 mg/dL). Liver Function Tests: No results for input(s): AST, ALT, ALKPHOS,  BILITOT, PROT, ALBUMIN in the last 168 hours. No results for input(s): LIPASE, AMYLASE in the last 168 hours. No results for input(s): AMMONIA in the last 168 hours. Coagulation Profile: No results for input(s): INR, PROTIME in the last 168 hours. Cardiac Enzymes: No results for input(s): CKTOTAL, CKMB, CKMBINDEX, TROPONINI in the last 168 hours. BNP (last 3 results) No results for input(s): PROBNP in the last 8760 hours. HbA1C: No results for input(s): HGBA1C in the last 72 hours. CBG: No results for input(s): GLUCAP in the last 168 hours. Lipid Profile: No results for input(s): CHOL, HDL, LDLCALC, TRIG, CHOLHDL, LDLDIRECT in the last 72 hours. Thyroid Function Tests: No results for input(s): TSH, T4TOTAL, FREET4, T3FREE, THYROIDAB in the last 72 hours. Anemia Panel: No results for input(s): VITAMINB12, FOLATE, FERRITIN, TIBC, IRON, RETICCTPCT in the last 72 hours. Sepsis Labs: No results for input(s): PROCALCITON, LATICACIDVEN in the last 168 hours.  No results found for this or any previous visit (from the past 240 hour(s)).       Radiology Studies: No results found.      Scheduled Meds: . amLODipine  5 mg Oral Daily  . enoxaparin (LOVENOX) injection  40 mg Subcutaneous Q24H  . feeding supplement (ENSURE ENLIVE)  237 mL Oral BID BM  . folic acid  1 mg Oral Daily  . ketoconazole   Topical BID  . multivitamin with minerals  1 tablet Oral Daily  . nicotine  21 mg Transdermal Daily  . thiamine  100 mg Oral Daily   Continuous Infusions:         Aline August, MD [facility] 02/01/2019, 11:44 AM

## 2019-02-01 NOTE — Progress Notes (Signed)
Occupational Therapy Treatment Patient Details Name: Marie Woods MRN: [id number] DOB: [birth year] Today's Date: 02/01/2019    History of present illness Patient is a 63 year old female with medical history significant of asthma, hypertension, alcohol abuse who was brought in by EMS after neighbors called in stating that patient is confused and altered. Patient admitted Severe Sepsis with Acute metabolic encephalopathy secondary to UTI.    OT comments  Pt making steady progress towards OT goals this session. Session focus on functional mobility, LB dressing and STM compensatory strategies to aid in BADL participation. Issued pt notebook to write in as STM compensatory method as pt continues to be disoriented to location and reason for hospital stay. Pt complete functional mobility greater than a household distance with RW and supervision. Instructed pt to recall all months of year and holidays during functional mobilitu with pt able to complete with no cues. Pt with difficulty naming various animals during functional mobility as pt noted to repeat "elephant" at least five times. DC plan remains appropriate, will continue to follow acutely per POC.   Follow Up Recommendations  SNF;Supervision/Assistance - 24 hour    Equipment Recommendations  3 in 1 bedside commode    Recommendations for Other Services      Precautions / Restrictions Precautions Precautions: Fall Restrictions Weight Bearing Restrictions: No       Mobility Bed Mobility               General bed mobility comments: OOB in recliner  Transfers Overall transfer level: Needs assistance Equipment used: Rolling walker (2 wheeled)   Sit to Stand: Supervision         General transfer comment: supervision for safety    Balance Overall balance assessment: Needs assistance Sitting-balance support: Feet supported Sitting balance-Leahy Scale: Good     Standing balance support: No upper extremity  supported;During functional activity Standing balance-Leahy Scale: Fair                             ADL either performed or assessed with clinical judgement   ADL Overall ADL's : Modified independent;Needs assistance/impaired             Lower Body Bathing: Sitting/lateral leans;Supervison/ safety;Set up Lower Body Bathing Details (indicate cue type and reason): simulated from recliner Upper Body Dressing : Minimal assistance;Sitting Upper Body Dressing Details (indicate cue type and reason): to don hospital gown as back side cover Lower Body Dressing: Set up;Supervision/safety;Sitting/lateral leans Lower Body Dressing Details (indicate cue type and reason): to don socks from recliner Toilet Transfer: Supervision/safety;RW;Ambulation Toilet Transfer Details (indicate cue type and reason): simulated via functional mobility         Functional mobility during ADLs: Supervision/safety;Rolling walker General ADL Comments: session focus on functional mobility and STM memory activities. issued pt notebook to assist with STM deficits     Vision Baseline Vision/History: Wears glasses Wears Glasses: Reading only Vision Assessment?: No apparent visual deficits   Perception     Praxis      Cognition Arousal/Alertness: Awake/alert Behavior During Therapy: WFL for tasks assessed/performed Overall Cognitive Status: Impaired/Different from baseline Area of Impairment: Orientation;Attention;Memory;Following commands;Awareness;Problem solving                 Orientation Level: Disoriented to;Place;Time;Situation Current Attention Level: Selective Memory: Decreased short-term memory Following Commands: Follows one step commands with increased time;Follows one step commands consistently;Follows multi-step commands inconsistently   Awareness: Emergent Problem Solving: Requires  verbal cues;Difficulty sequencing General Comments: pt continues to be limited by STM  deficits. continues to ask questions about location and length of stay        Exercises     Shoulder Instructions       General Comments pt reports SOB however O2 100% on RA    Pertinent Vitals/ Pain       Pain Assessment: No/denies pain  Home Living                                          Prior Functioning/Environment              Frequency  Min 2X/week        Progress Toward Goals  OT Goals(current goals can now be found in the care plan section)  Progress towards OT goals: Progressing toward goals  Acute Rehab OT Goals Patient Stated Goal: return to PLOF OT Goal Formulation: With patient Time For Goal Achievement: 02/03/19 Potential to Achieve Goals: Good  Plan Discharge plan remains appropriate;Frequency remains appropriate    Co-evaluation                 AM-PAC OT "6 Clicks" Daily Activity     Outcome Measure   Help from another person eating meals?: None Help from another person taking care of personal grooming?: A Little Help from another person toileting, which includes using toliet, bedpan, or urinal?: A Little Help from another person bathing (including washing, rinsing, drying)?: A Little Help from another person to put on and taking off regular upper body clothing?: None Help from another person to put on and taking off regular lower body clothing?: A Little 6 Click Score: 20    End of Session Equipment Utilized During Treatment: Rolling walker  OT Visit Diagnosis: Unsteadiness on feet (R26.81);Other abnormalities of gait and mobility (R26.89);Muscle weakness (generalized) (M62.81);Other symptoms and signs involving cognitive function   Activity Tolerance Patient tolerated treatment well   Patient Left in chair;with call bell/phone within reach   Nurse Communication Mobility status        Time: 3825-0539 OT Time Calculation (min): 25 min  Charges: OT General Charges $OT Visit: 1 Visit OT  Treatments $Self Care/Home Management : 8-22 mins $Therapeutic Activity: 8-22 mins  Audery Amel., COTA/L Acute Rehabilitation Services [phone]    Angelina Pih 02/01/2019, 2:55 PM

## 2019-02-02 NOTE — Progress Notes (Signed)
[name], female   DOB: 1955/10/29, 63 y.o.   MRN: [id number]  PROGRESS NOTE    Marie Woods  [id number] DOB: August 09, 1955 DOA: 12/13/2018 PCP: Ladell Pier, MD   Brief Narrative:  63 year old female with history of asthma, hypertension, alcohol abuse was brought to the hospital confused and altered.  She was found to have UTI; urine culture showed multiple species.  MRI brain was suggestive of possible Wernicke's encephalopathy.  Treated with thiamine.  PT recommended SNF placement.  Assessment & Plan:   Severe sepsis: Present on admission UTI -Urine culture showed multiple species.  Repeat culture showed less than 10,000 colonies.  Was initially treated with IV Rocephin and fluids.  Subsequently antibiotics have been discontinued. -Currently afebrile and hemodynamically stable.  Sepsis has resolved.  Chest x-ray was negative for infection.  Acute metabolic encephalopathy Possible Wernicke's encephalopathy -CT of the head showed mild atrophic changes without acute intracranial abnormality -MRI of the brain: Symmetric diffusion weighted and T2/FLAIR hyperintense signal abnormality within the medial thalami.  Findings favor sequelae of Wernicke's encephalopathy There was question of patient having alcohol abuse however alcohol level less than 10.  Urine drug screen negative -Completed IV thiamine.  Currently on oral thiamine now.  -Continue folic acid, multivitamin. -Outpatient follow-up with neurology. -Mental status still fluctuating.  Suspect underlying dementia with memory issues.    Essential hypertension -Continue Norvasc.  History of alcohol abuse -Treated with CIWA protocol.  Continue folate, thiamine and multivitamin.   History of tobacco abuse -Continue nicotine patch   Generalized deconditioning -PT/OT recommending SNF.  Social worker following. -Feel that patient does need supervision as she appears to have short-term memory impairment.  We  discuss the reason for her hospitalization on a daily basis but she does not remember the next day.  DVT prophylaxis: Lovenox Code Status: Full Family Communication: None at bedside Disposition Plan: SNF once bed is available  Consultants: Neurology via phone  Procedures: None  Antimicrobials:  Anti-infectives (From admission, onward)   Start     Dose/Rate Route Frequency Ordered Stop   12/14/18 0100  cefTRIAXone (ROCEPHIN) 1 g in sodium chloride 0.9 % 100 mL IVPB  Status:  Discontinued     1 g 200 mL/hr over 30 Minutes Intravenous Every 24 hours 12/14/18 0010 12/17/18 0948       Subjective: Patient seen and examined at bedside.  Poor historian.  No overnight with history of dysphagia, fever or vomiting reported.   Objective: Vitals:   01/31/19 2253 02/01/19 0842 02/01/19 1634 02/01/19 2318  BP: 122/83 (!) 101/57 (!) 130/95 131/85  Pulse: 83 88 78 90  Resp: 20   20  Temp: 98.4 F (36.9 C) 98.5 F (36.9 C) 98.4 F (36.9 C) 98.5 F (36.9 C)  TempSrc:  Oral Oral   SpO2: 99% 97% 98% 98%  Weight:      Height:        Intake/Output Summary (Last 24 hours) at 02/02/2019 0758 Last data filed at 02/01/2019 2012 Gross per 24 hour  Intake 120 ml  Output --  Net 120 ml   Filed Weights   12/15/18 0101  Weight: 98.4 kg    Examination:  General exam: No acute distress.  Looks chronically ill.  Looks older than stated age.  Sleepy, wakes up slightly, hardly answers any questions. Respiratory system: Bilateral decreased breath sounds at bases   cardiovascular system: Rate controlled, S1-S2 heard Gastrointestinal system: Abdomen is nondistended, soft and nontender. Normal bowel sounds  heard.  Data Reviewed: I have personally reviewed following labs and imaging studies  CBC: No results for input(s): WBC, NEUTROABS, HGB, HCT, MCV, PLT in the last 168 hours. Basic Metabolic Panel: Recent Labs  Lab 01/31/19 0415  CREATININE 0.67   GFR: Estimated Creatinine Clearance:  89 mL/min (by C-G formula based on SCr of 0.67 mg/dL). Liver Function Tests: No results for input(s): AST, ALT, ALKPHOS, BILITOT, PROT, ALBUMIN in the last 168 hours. No results for input(s): LIPASE, AMYLASE in the last 168 hours. No results for input(s): AMMONIA in the last 168 hours. Coagulation Profile: No results for input(s): INR, PROTIME in the last 168 hours. Cardiac Enzymes: No results for input(s): CKTOTAL, CKMB, CKMBINDEX, TROPONINI in the last 168 hours. BNP (last 3 results) No results for input(s): PROBNP in the last 8760 hours. HbA1C: No results for input(s): HGBA1C in the last 72 hours. CBG: No results for input(s): GLUCAP in the last 168 hours. Lipid Profile: No results for input(s): CHOL, HDL, LDLCALC, TRIG, CHOLHDL, LDLDIRECT in the last 72 hours. Thyroid Function Tests: No results for input(s): TSH, T4TOTAL, FREET4, T3FREE, THYROIDAB in the last 72 hours. Anemia Panel: No results for input(s): VITAMINB12, FOLATE, FERRITIN, TIBC, IRON, RETICCTPCT in the last 72 hours. Sepsis Labs: No results for input(s): PROCALCITON, LATICACIDVEN in the last 168 hours.  No results found for this or any previous visit (from the past 240 hour(s)).       Radiology Studies: No results found.      Scheduled Meds: . amLODipine  5 mg Oral Daily  . enoxaparin (LOVENOX) injection  40 mg Subcutaneous Q24H  . feeding supplement (ENSURE ENLIVE)  237 mL Oral BID BM  . folic acid  1 mg Oral Daily  . ketoconazole   Topical BID  . multivitamin with minerals  1 tablet Oral Daily  . nicotine  21 mg Transdermal Daily  . thiamine  100 mg Oral Daily   Continuous Infusions:         Glade Lloyd, MD [facility] 02/02/2019, 7:58 AM

## 2019-02-02 NOTE — Plan of Care (Signed)
  Problem: Clinical Measurements: Goal: Ability to maintain clinical measurements within normal limits will improve Outcome: Progressing   Problem: Pain Managment: Goal: General experience of comfort will improve Outcome: Progressing   Problem: Skin Integrity: Goal: Risk for impaired skin integrity will decrease Outcome: Progressing   

## 2019-02-03 MED ORDER — LORAZEPAM 2 MG/ML IJ SOLN: 1.0000 mg | Freq: Four times a day (QID) | INTRAMUSCULAR | Status: DC | PRN

## 2019-02-03 MED ORDER — HALOPERIDOL LACTATE 5 MG/ML IJ SOLN
5.0000 mg | Freq: Four times a day (QID) | INTRAMUSCULAR | Status: DC | PRN
  Filled 2019-02-03: qty 1

## 2019-02-03 NOTE — Progress Notes (Signed)
Pt was dressed in personal clothes and was found walking down the hallway with her walker. Pt stated that she was leaving and she did not need to be here anymore. Pt was redirected by staff to her room and agreed to stay in her room. Pt stated "This place is jail if I cant walk around." Patient now believes that she is in jail and that her TV will turn off at a certain time and keeps asking permission to keep the TV on longer. Pt is now resting comfortably. Will continue to monitor.

## 2019-02-03 NOTE — Progress Notes (Signed)
2220: Pt stated that she was not staying here anymore and will not stay the night. Trazodone was administered around 2100. PRN IM Haldol was administered in Right Gluteal muscle. Patient has now calmed down and is resting at the moment. Will continue to monitor.

## 2019-02-03 NOTE — Progress Notes (Signed)
This afternoon patient walked out into the hallway with a walker claiming that her sister was picking her up at the emergency exit and she was leaving. Patient became agitated when staff tried to redirect patient to try and get her to return to her room. CN went to confirm that there was not a family member in the emergency department to pick up patient. MD notified and new PRN orders were written. No PRN meds were given as patient calmed down after speaking with a friend on the phone. Rn explained the situation to the close friend and the friend explained to the patient that she was not ready to leave the hospital at this time. Patient has been reminded several times today that she has been in the hospital for over 50 days. Patient resting comfortably in chair now. Will continue to monitor.

## 2019-02-03 NOTE — Plan of Care (Signed)

## 2019-02-03 NOTE — Progress Notes (Signed)
[name], female   DOB: 10/05/1955, 64 y.o.   MRN: [id number]  PROGRESS NOTE    Marie Woods  [id number]:[id number] DOB: [birth year] DOA: 12/13/2018 PCP: Ladell Pier, MD   Brief Narrative:  64 year old female with history of asthma, hypertension, alcohol abuse was brought to the hospital confused and altered.  She was found to have UTI; urine culture showed multiple species.  MRI brain was suggestive of possible Wernicke's encephalopathy.  Treated with thiamine.  PT recommended SNF placement.  Assessment & Plan:   Severe sepsis: Present on admission UTI -Urine culture showed multiple species.  Repeat culture showed less than 10,000 colonies.  Was initially treated with IV Rocephin and fluids.  Subsequently antibiotics have been discontinued. -Currently afebrile and hemodynamically stable.  Sepsis has resolved.  Chest x-ray was negative for infection.  Acute metabolic encephalopathy Possible Wernicke's encephalopathy -CT of the head showed mild atrophic changes without acute intracranial abnormality -MRI of the brain: Symmetric diffusion weighted and T2/FLAIR hyperintense signal abnormality within the medial thalami.  Findings favor sequelae of Wernicke's encephalopathy There was question of patient having alcohol abuse however alcohol level less than 10.  Urine drug screen negative -Completed IV thiamine.  Currently on oral thiamine now.  -Continue folic acid, multivitamin. -Outpatient follow-up with neurology. -Mental status still fluctuating.  Suspect underlying dementia with memory issues.    Essential hypertension -Continue Norvasc.  History of alcohol abuse -Treated with CIWA protocol.  Continue folate, thiamine and multivitamin.   History of tobacco abuse -Continue nicotine patch   Generalized deconditioning -PT/OT recommending SNF.  Social worker following. -Feel that patient does need supervision as she appears to have short-term memory impairment.  We  discuss the reason for her hospitalization on a daily basis but she does not remember the next day.  DVT prophylaxis: Lovenox Code Status: Full Family Communication: None at bedside Disposition Plan: SNF once bed is available  Consultants: Neurology via phone  Procedures: None  Antimicrobials:  Anti-infectives (From admission, onward)   Start     Dose/Rate Route Frequency Ordered Stop   12/14/18 0100  cefTRIAXone (ROCEPHIN) 1 g in sodium chloride 0.9 % 100 mL IVPB  Status:  Discontinued     1 g 200 mL/hr over 30 Minutes Intravenous Every 24 hours 12/14/18 0010 12/17/18 0948       Subjective: Patient seen and examined at bedside.  Poor historian.  No fever, vomiting or agitation reported.   Objective: Vitals:   02/01/19 2318 02/02/19 0817 02/02/19 1655 02/02/19 1928  BP: 131/85 93/66 128/66 105/78  Pulse: 90 73 82 71  Resp: 20     Temp: 98.5 F (36.9 C) 98.2 F (36.8 C) 98.9 F (37.2 C) 97.8 F (36.6 C)  TempSrc:  Oral Oral Oral  SpO2: 98% 99% 100% 100%  Weight:      Height:       No intake or output data in the 24 hours ending 02/03/19 0753 Filed Weights   12/15/18 0101  Weight: 98.4 kg    Examination:  General exam: No distress.  Looks chronically ill.  Looks older than stated age.  Sleepy. Respiratory system: Bilateral decreased breath sounds at bases   cardiovascular system: Rate controlled, S1-S2 heard Gastrointestinal system: Abdomen is nondistended, soft and nontender. Normal bowel sounds heard.  Data Reviewed: I have personally reviewed following labs and imaging studies  CBC: No results for input(s): WBC, NEUTROABS, HGB, HCT, MCV, PLT in the last 168 hours. Basic Metabolic Panel: Recent  Labs  Lab 01/31/19 0415  CREATININE 0.67   GFR: Estimated Creatinine Clearance: 89 mL/min (by C-G formula based on SCr of 0.67 mg/dL). Liver Function Tests: No results for input(s): AST, ALT, ALKPHOS, BILITOT, PROT, ALBUMIN in the last 168 hours. No results  for input(s): LIPASE, AMYLASE in the last 168 hours. No results for input(s): AMMONIA in the last 168 hours. Coagulation Profile: No results for input(s): INR, PROTIME in the last 168 hours. Cardiac Enzymes: No results for input(s): CKTOTAL, CKMB, CKMBINDEX, TROPONINI in the last 168 hours. BNP (last 3 results) No results for input(s): PROBNP in the last 8760 hours. HbA1C: No results for input(s): HGBA1C in the last 72 hours. CBG: No results for input(s): GLUCAP in the last 168 hours. Lipid Profile: No results for input(s): CHOL, HDL, LDLCALC, TRIG, CHOLHDL, LDLDIRECT in the last 72 hours. Thyroid Function Tests: No results for input(s): TSH, T4TOTAL, FREET4, T3FREE, THYROIDAB in the last 72 hours. Anemia Panel: No results for input(s): VITAMINB12, FOLATE, FERRITIN, TIBC, IRON, RETICCTPCT in the last 72 hours. Sepsis Labs: No results for input(s): PROCALCITON, LATICACIDVEN in the last 168 hours.  No results found for this or any previous visit (from the past 240 hour(s)).       Radiology Studies: No results found.      Scheduled Meds: . amLODipine  5 mg Oral Daily  . enoxaparin (LOVENOX) injection  40 mg Subcutaneous Q24H  . feeding supplement (ENSURE ENLIVE)  237 mL Oral BID BM  . folic acid  1 mg Oral Daily  . ketoconazole   Topical BID  . multivitamin with minerals  1 tablet Oral Daily  . nicotine  21 mg Transdermal Daily  . thiamine  100 mg Oral Daily   Continuous Infusions:         Glade Lloyd, MD [facility] 02/03/2019, 7:53 AM

## 2019-02-04 NOTE — Progress Notes (Signed)
[name], female   DOB: [birth year], 64 y.o.   MRN: [id number]  PROGRESS NOTE    Marie Woods  [id number] DOB: April 25, 1955 DOA: 12/13/2018 PCP: Ladell Pier, MD   Brief Narrative:  64 year old female with history of asthma, hypertension, alcohol abuse was brought to the hospital confused and altered.  She was found to have UTI; urine culture showed multiple species.  MRI brain was suggestive of possible Wernicke's encephalopathy.  Treated with thiamine.  PT recommended SNF placement.  Assessment & Plan:   Severe sepsis: Present on admission UTI -Urine culture showed multiple species.  Repeat culture showed less than 10,000 colonies.  Was initially treated with IV Rocephin and fluids.  Subsequently antibiotics have been discontinued. -Currently afebrile and hemodynamically stable.  Sepsis has resolved.  Chest x-ray was negative for infection.  Acute metabolic encephalopathy Possible Wernicke's encephalopathy -CT of the head showed mild atrophic changes without acute intracranial abnormality -MRI of the brain: Symmetric diffusion weighted and T2/FLAIR hyperintense signal abnormality within the medial thalami.  Findings favor sequelae of Wernicke's encephalopathy There was question of patient having alcohol abuse however alcohol level less than 10.  Urine drug screen negative -Completed IV thiamine.  Currently on oral thiamine now.  -Continue folic acid, multivitamin. -Outpatient follow-up with neurology. -Mental status still fluctuating.  Suspect underlying dementia with memory issues.    Agitation -Patient was extremely agitated yesterday and was threatening to leave his medical advice.  Use as needed Haldol/Ativan.  Psych consult  Essential hypertension -Continue Norvasc.  History of alcohol abuse -Treated with CIWA protocol.  Continue folate, thiamine and multivitamin.   History of tobacco abuse -Continue nicotine patch   Generalized  deconditioning -PT/OT recommending SNF.  Social worker following. -Feel that patient does need supervision as she appears to have short-term memory impairment.  We discuss the reason for her hospitalization on a daily basis but she does not remember the next day.  DVT prophylaxis: Lovenox Code Status: Full Family Communication: None at bedside Disposition Plan: SNF once bed is available  Consultants: Neurology via phone  Procedures: None  Antimicrobials:  Anti-infectives (From admission, onward)   Start     Dose/Rate Route Frequency Ordered Stop   12/14/18 0100  cefTRIAXone (ROCEPHIN) 1 g in sodium chloride 0.9 % 100 mL IVPB  Status:  Discontinued     1 g 200 mL/hr over 30 Minutes Intravenous Every 24 hours 12/14/18 0010 12/17/18 0948       Subjective: Patient seen and examined at bedside.  Poor historian.  Currently appears calm.  Nursing staff reports increasing episodes of agitation overnight and patient was threatening to leave [place].    Objective: Vitals:   02/03/19 0810 02/03/19 1623 02/03/19 2328 02/04/19 0747  BP: 119/76 101/81 102/71 110/67  Pulse: 86 80 84 75  Resp: 19 20 12    Temp: 97.9 F (36.6 C) 98.7 F (37.1 C) 97.8 F (36.6 C) 98.5 F (36.9 C)  TempSrc:    Oral  SpO2: 100% 100% 95% 99%  Weight:      Height:        Intake/Output Summary (Last 24 hours) at 02/04/2019 0759 Last data filed at 02/03/2019 0900 Gross per 24 hour  Intake 120 ml  Output --  Net 120 ml   Filed Weights   12/15/18 0101  Weight: 98.4 kg    Examination:  General exam: No acute distress.  Looks chronically ill.  Looks older than stated age.  Sleepy. Respiratory  system: Bilateral decreased breath sounds at bases with some scattered crackles  cardiovascular system: S1-S2 heard, rate controlled Gastrointestinal system: Abdomen is nondistended, soft and nontender. Normal bowel sounds heard. Extremities: No cyanosis or clubbing.  Trace lower extremity  edema.  Data Reviewed: I have personally reviewed following labs and imaging studies  CBC: No results for input(s): WBC, NEUTROABS, HGB, HCT, MCV, PLT in the last 168 hours. Basic Metabolic Panel: Recent Labs  Lab 01/31/19 0415  CREATININE 0.67   GFR: Estimated Creatinine Clearance: 89 mL/min (by C-G formula based on SCr of 0.67 mg/dL). Liver Function Tests: No results for input(s): AST, ALT, ALKPHOS, BILITOT, PROT, ALBUMIN in the last 168 hours. No results for input(s): LIPASE, AMYLASE in the last 168 hours. No results for input(s): AMMONIA in the last 168 hours. Coagulation Profile: No results for input(s): INR, PROTIME in the last 168 hours. Cardiac Enzymes: No results for input(s): CKTOTAL, CKMB, CKMBINDEX, TROPONINI in the last 168 hours. BNP (last 3 results) No results for input(s): PROBNP in the last 8760 hours. HbA1C: No results for input(s): HGBA1C in the last 72 hours. CBG: No results for input(s): GLUCAP in the last 168 hours. Lipid Profile: No results for input(s): CHOL, HDL, LDLCALC, TRIG, CHOLHDL, LDLDIRECT in the last 72 hours. Thyroid Function Tests: No results for input(s): TSH, T4TOTAL, FREET4, T3FREE, THYROIDAB in the last 72 hours. Anemia Panel: No results for input(s): VITAMINB12, FOLATE, FERRITIN, TIBC, IRON, RETICCTPCT in the last 72 hours. Sepsis Labs: No results for input(s): PROCALCITON, LATICACIDVEN in the last 168 hours.  No results found for this or any previous visit (from the past 240 hour(s)).       Radiology Studies: No results found.      Scheduled Meds: . amLODipine  5 mg Oral Daily  . enoxaparin (LOVENOX) injection  40 mg Subcutaneous Q24H  . feeding supplement (ENSURE ENLIVE)  237 mL Oral BID BM  . folic acid  1 mg Oral Daily  . ketoconazole   Topical BID  . multivitamin with minerals  1 tablet Oral Daily  . nicotine  21 mg Transdermal Daily  . thiamine  100 mg Oral Daily   Continuous Infusions:         Glade Lloyd, MD [facility] 02/04/2019, 7:59 AM

## 2019-02-04 NOTE — Progress Notes (Signed)
Physical Therapy Treatment Patient Details Name: Marie Woods MRN: [id number] DOB: [birth year] Today's Date: 02/04/2019    History of Present Illness Patient is a 64 year old female with medical history significant of asthma, hypertension, alcohol abuse who was brought in by EMS after neighbors called in stating that patient is confused and altered. Patient admitted Severe Sepsis with Acute metabolic encephalopathy secondary to UTI.     PT Comments    Pt tolerated treatment well. Pt demonstrates improved gait quality, not requiring physical assistance, although maintaining forward trunk lean due to strength and endurance deficits. Pt also remains confused, disoriented to time by over a week. Due to pt instability during OOB activity as well as confusion and safety awareness deficits she is at an increased falls risk. Pt will benefit from continued acute PT POC to improve dynamic balance and gait quality.   Follow Up Recommendations  SNF     Equipment Recommendations  Rolling walker with 5" wheels    Recommendations for Other Services       Precautions / Restrictions Precautions Precautions: Fall Precaution Comments: mild confusion Restrictions Weight Bearing Restrictions: No    Mobility  Bed Mobility                  Transfers Overall transfer level: Needs assistance Equipment used: Rolling walker (2 wheeled) Transfers: Sit to/from Stand Sit to Stand: Supervision            Ambulation/Gait Ambulation/Gait assistance: Supervision Gait Distance (Feet): 400 Feet Assistive device: Rolling walker (2 wheeled) Gait Pattern/deviations: Step-through pattern;Trunk flexed Gait velocity: decreased Gait velocity interpretation: 1.31 - 2.62 ft/sec, indicative of limited community ambulator General Gait Details: PT providing verbal cues to maintain trunk and BOS inside frame of RW to reduce trunk flexion. Pt is able to temproarily do this and recognizes when she is flexed  forward, however requires forward lean to increase UE support due to LE endruance and strength deficits   Stairs             Wheelchair Mobility    Modified Rankin (Stroke Patients Only)       Balance Overall balance assessment: Needs assistance Sitting-balance support: No upper extremity supported;Feet supported Sitting balance-Leahy Scale: Normal     Standing balance support: Single extremity supported;During functional activity Standing balance-Leahy Scale: Good Standing balance comment: close supervision                            Cognition Arousal/Alertness: Awake/alert Behavior During Therapy: WFL for tasks assessed/performed Overall Cognitive Status: Impaired/Different from baseline                   Orientation Level: Disoriented to;Time   Memory: Decreased short-term memory   Safety/Judgement: Decreased awareness of safety            Exercises      General Comments General comments (skin integrity, edema, etc.): VSS      Pertinent Vitals/Pain Pain Assessment: No/denies pain    Home Living                      Prior Function            PT Goals (current goals can now be found in the care plan section) Acute Rehab PT Goals Patient Stated Goal: return to PLOF Progress towards PT goals: Progressing toward goals    Frequency    Min 3X/week  PT Plan Current plan remains appropriate    Co-evaluation              AM-PAC PT "6 Clicks" Mobility   Outcome Measure  Help needed turning from your back to your side while in a flat bed without using bedrails?: None Help needed moving from lying on your back to sitting on the side of a flat bed without using bedrails?: None Help needed moving to and from a bed to a chair (including a wheelchair)?: None Help needed standing up from a chair using your arms (e.g., wheelchair or bedside chair)?: None Help needed to walk in hospital room?: A Little Help  needed climbing 3-5 steps with a railing? : A Little 6 Click Score: 22    End of Session Equipment Utilized During Treatment: (none) Activity Tolerance: Patient tolerated treatment well Patient left: in bed;with call bell/phone within reach;with bed alarm set Nurse Communication: Mobility status PT Visit Diagnosis: Other abnormalities of gait and mobility (R26.89);Muscle weakness (generalized) (M62.81);Difficulty in walking, not elsewhere classified (R26.2)     Time: 1600-1610 PT Time Calculation (min) (ACUTE ONLY): 10 min  Charges:  $Gait Training: 8-22 mins                     Arlyss Gandy, PT, DPT Acute Rehabilitation Pager: [phone]    Arlyss Gandy 02/04/2019, 4:34 PM

## 2019-02-04 NOTE — Consult Note (Signed)
Telepsych Consultation   Reason for Consult:  ''Extreme agitation'' Referring Physician: Glade Lloyd, MD Location of Patient: [facility] Location of Provider: [facility]  Patient Identification: Marie Woods MRN:  [id number] Principal Diagnosis: AMS (altered mental status) Diagnosis:  Principal Problem:   AMS (altered mental status) Active Problems:   History of alcohol use   TOBACCO ABUSE   Essential hypertension   Confusion   Total Time spent with patient: 45 minutes  Subjective:   Marie Woods is a 64 y.o. female patient admitted due to confusion and altered mental status.  HPI: Patient  with history of asthma, hypertension, alcohol use disorder who  was brought to the hospital few days ago due to being confused and altered. Patient denies any recent diagnosis of mental illness but states that she received counseling for depression about 10 years ago. Patient reports that she was brought to the hospital by the EMS after she was found by her neighbor incoherent and confused. Per chart review, patient was found to have UTI; urine culture showed multiple species and MRI brain suggestive of possible Wernicke's encephalopathy-treated with thiamine. Today, patient is alert, cooperative, calm, oriented x3 and denies depression, psychosis, agitation or self harming thoughts. Patient reports extensive history of alcohol use, last use about 2009/2010 and denies drug abuse.  Past Psychiatric History: as above  Risk to Self:  denies Risk to Others:  denies Prior Inpatient Therapy:  none reported Prior Outpatient Therapy:  none reported  Past Medical History:  Past Medical History:  Diagnosis Date  . Asthma   . Eczema   . Hypertension    History reviewed. No pertinent surgical history. Family History:  Family History  Problem Relation Age of Onset  . Cancer Mother   . Heart disease Mother   . Heart disease Father    Family Psychiatric  History:  Social History:   Social History   Substance and Sexual Activity  Alcohol Use Yes   Comment: 1 beer daily after work     Social History   Substance and Sexual Activity  Drug Use No    Social History   Socioeconomic History  . Marital status: Single    Spouse name: Not on file  . Number of children: Not on file  . Years of education: Not on file  . Highest education level: Not on file  Occupational History  . Not on file  Tobacco Use  . Smoking status: Current Every Day Smoker    Packs/day: 0.25    Types: Cigarettes  . Smokeless tobacco: Current User  Substance and Sexual Activity  . Alcohol use: Yes    Comment: 1 beer daily after work  . Drug use: No  . Sexual activity: Not on file  Other Topics Concern  . Not on file  Social History Narrative  . Not on file   Social Determinants of Health   Financial Resource Strain:   . Difficulty of Paying Living Expenses: Not on file  Food Insecurity:   . Worried About Programme researcher, broadcasting/film/video in the Last Year: Not on file  . Ran Out of Food in the Last Year: Not on file  Transportation Needs:   . Lack of Transportation (Medical): Not on file  . Lack of Transportation (Non-Medical): Not on file  Physical Activity:   . Days of Exercise per Week: Not on file  . Minutes of Exercise per Session: Not on file  Stress:   . Feeling of Stress : Not  on file  Social Connections:   . Frequency of Communication with Friends and Family: Not on file  . Frequency of Social Gatherings with Friends and Family: Not on file  . Attends Religious Services: Not on file  . Active Member of Clubs or Organizations: Not on file  . Attends Archivist Meetings: Not on file  . Marital Status: Not on file   Additional Social History:    Allergies:   Allergies  Allergen Reactions  . Milk-Related Compounds Rash    Pt states she eats a lot of products with milk in it, but straight milk breaks her out into bumps.    Labs: No results found for this or any  previous visit (from the past 48 hour(s)).  Medications:  Current Facility-Administered Medications  Medication Dose Route Frequency Provider Last Rate Last Admin  . acetaminophen (TYLENOL) tablet 650 mg  650 mg Oral Q6H PRN Harold Hedge, MD   650 mg at 02/03/19 1510   Or  . acetaminophen (TYLENOL) suppository 650 mg  650 mg Rectal Q6H PRN Harold Hedge, MD      . albuterol (PROVENTIL) (2.5 MG/3ML) 0.083% nebulizer solution 2.5 mg  2.5 mg Nebulization Q4H PRN Alekh, Kshitiz, MD      . alum & mag hydroxide-simeth (MAALOX/MYLANTA) 200-200-20 MG/5ML suspension 30 mL  30 mL Oral Q6H PRN Adele Barthel D, MD      . amLODipine (NORVASC) tablet 5 mg  5 mg Oral Daily Harold Hedge, MD   5 mg at 02/04/19 1025  . camphor-menthol (SARNA) lotion   Topical PRN Radene Gunning, NP   Given at 01/28/19 [phone]  . diphenhydrAMINE (BENADRYL) capsule 25 mg  25 mg Oral Q8H PRN Radene Gunning, NP   25 mg at 01/30/19 1645  . enoxaparin (LOVENOX) injection 40 mg  40 mg Subcutaneous Q24H Gala Romney L, MD   40 mg at 02/04/19 0537  . feeding supplement (ENSURE ENLIVE) (ENSURE ENLIVE) liquid 237 mL  237 mL Oral BID BM Adele Barthel D, MD   237 mL at 02/03/19 1315  . folic acid (FOLVITE) tablet 1 mg  1 mg Oral Daily Mikhail, [place], DO   1 mg at 02/04/19 1025  . haloperidol lactate (HALDOL) injection 5 mg  5 mg Intravenous Q6H PRN Aline August, MD      . hydrocerin (EUCERIN) cream 2 application  2 application Topical PRN Blain Pais, MD   2 application at 56/21/30 2100  . hydrOXYzine (ATARAX/VISTARIL) tablet 25 mg  25 mg Oral BID PRN Eulogio Bear U, DO   25 mg at 02/02/19 1406  . ketoconazole (NIZORAL) 2 % cream   Topical BID Harold Hedge, MD   Given at 02/03/19 2041  . loratadine (CLARITIN) tablet 10 mg  10 mg Oral Daily PRN Aline August, MD   10 mg at 02/04/19 1025  . LORazepam (ATIVAN) injection 1 mg  1 mg Intravenous Q6H PRN Alekh, Kshitiz, MD      . meclizine (ANTIVERT) tablet 12.5 mg   12.5 mg Oral TID PRN Adele Barthel D, MD   12.5 mg at 01/09/19 1218  . multivitamin with minerals tablet 1 tablet  1 tablet Oral Daily Cristal Ford, DO   1 tablet at 02/04/19 1025  . nicotine (NICODERM CQ - dosed in mg/24 hours) patch 21 mg  21 mg Transdermal Daily Elwyn Reach, MD   21 mg at 02/03/19 0848  . ondansetron (ZOFRAN) tablet 4 mg  4 mg Oral Q6H PRN Rometta Emery, MD       Or  . ondansetron (ZOFRAN) injection 4 mg  4 mg Intravenous Q6H PRN Earlie Lou L, MD      . phenol (CHLORASEPTIC) mouth spray 1 spray  1 spray Mouth/Throat PRN Edsel Petrin, DO   1 spray at 12/19/18 0818  . thiamine (VITAMIN B-1) tablet 100 mg  100 mg Oral Daily Hanley Ben, Kshitiz, MD   100 mg at 02/04/19 1025  . traMADol (ULTRAM) tablet 50 mg  50 mg Oral Q6H PRN Glade Lloyd, MD   50 mg at 02/02/19 1204  . traZODone (DESYREL) tablet 50 mg  50 mg Oral QHS PRN Dimple Nanas, MD   50 mg at 02/03/19 2041    Musculoskeletal: Strength & Muscle Tone: not tested-patient assessed via tele health Gait & Station: not tested-patient assessed via tele health Patient leans: N/A  Psychiatric Specialty Exam: Physical Exam  Psychiatric: She has a normal mood and affect. Her speech is normal and behavior is normal. Judgment and thought content normal. Cognition and memory are normal.    Review of Systems  Constitutional: Negative.   HENT: Negative.   Eyes: Negative.   Respiratory: Negative.   Cardiovascular: Negative.   Gastrointestinal: Negative.   Endocrine: Negative.   Genitourinary: Negative.   Allergic/Immunologic: Negative.   Neurological: Negative.   Hematological: Negative.   Psychiatric/Behavioral: Negative.     Blood pressure 110/67, pulse 75, temperature 98.5 F (36.9 C), temperature source Oral, resp. rate 12, height 5\' 7"  (1.702 m), weight 98.4 kg, SpO2 99 %.Body mass index is 33.99 kg/m.  General Appearance: Casual  Eye Contact:  Good  Speech:  Clear and Coherent   Volume:  Normal  Mood:  Euthymic  Affect:  Congruent  Thought Process:  Coherent and Linear  Orientation:  Full (Time, Place, and Person)  Thought Content:  Logical  Suicidal Thoughts:  No  Homicidal Thoughts:  No  Memory:  Immediate;   Good Recent;   Good Remote;   Good  Judgement:  Intact  Insight:  Fair  Psychomotor Activity:  Normal  Concentration:  Concentration: Good and Attention Span: Good  Recall:  Good  Fund of Knowledge:  Good  Language:  Good  Akathisia:  No  Handed:  Right  AIMS (if indicated):     Assets:  Communication Skills Desire for Improvement  ADL's:  marginal  Cognition:  WNL  Sleep:   fair     Treatment Plan Summary: 64 year old female with history of alcohol abuse but denies mental illness. She was admitted due to being confused and altered. Chart review and collateral information from the nursing staff revealed that patient has been calm and cooperative today. Patient is alert, oriented, denies depression, psychosis, delusions or self harming thoughts. Base on my evaluation today, patient does not need any psychiatric intervention at this point.   Disposition: No evidence of imminent risk to self or others at present.   Patient does not meet criteria for psychiatric inpatient admission. Supportive therapy provided about ongoing stressors. Psychiatric service signing out. Re-consult as needed  This service was provided via telemedicine using a 2-way, interactive audio and video technology.  Names of all persons participating in this telemedicine service and their role in this encounter. Name: Marie Woods Role: Patient  Name: Thayer Dallas Role: RN  Name: Florentina Addison, MD Role: Psychiatrist  Name:  Role:     Thedore Mins, MD 02/04/2019 11:16 AM

## 2019-02-05 LAB — CBC WITH DIFFERENTIAL/PLATELET
Abs Immature Granulocytes: 0.04 10*3/uL (ref 0.00–0.07)
Basophils Absolute: 0 10*3/uL (ref 0.0–0.1)
Basophils Relative: 1 %
Eosinophils Absolute: 0.3 10*3/uL (ref 0.0–0.5)
Eosinophils Relative: 4 %
HCT: 34.7 % — ABNORMAL LOW (ref 36.0–46.0)
Hemoglobin: 11.4 g/dL — ABNORMAL LOW (ref 12.0–15.0)
Immature Granulocytes: 1 %
Lymphocytes Relative: 38 %
Lymphs Abs: 2.6 10*3/uL (ref 0.7–4.0)
MCH: 29.2 pg (ref 26.0–34.0)
MCHC: 32.9 g/dL (ref 30.0–36.0)
MCV: 88.7 fL (ref 80.0–100.0)
Monocytes Absolute: 0.9 10*3/uL (ref 0.1–1.0)
Monocytes Relative: 13 %
Neutro Abs: 2.9 10*3/uL (ref 1.7–7.7)
Neutrophils Relative %: 43 %
Platelets: 284 10*3/uL (ref 150–400)
RBC: 3.91 MIL/uL (ref 3.87–5.11)
RDW: 15.4 % (ref 11.5–15.5)
WBC: 6.7 10*3/uL (ref 4.0–10.5)
nRBC: 0 % (ref 0.0–0.2)

## 2019-02-05 LAB — COMPREHENSIVE METABOLIC PANEL
ALT: 15 U/L (ref 0–44)
AST: 15 U/L (ref 15–41)
Albumin: 3 g/dL — ABNORMAL LOW (ref 3.5–5.0)
Alkaline Phosphatase: 36 U/L — ABNORMAL LOW (ref 38–126)
Anion gap: 9 (ref 5–15)
BUN: 13 mg/dL (ref 8–23)
CO2: 23 mmol/L (ref 22–32)
Calcium: 9.4 mg/dL (ref 8.9–10.3)
Chloride: 108 mmol/L (ref 98–111)
Creatinine, Ser: 0.68 mg/dL (ref 0.44–1.00)
GFR calc Af Amer: >60 mL/min (ref >60)
GFR calc non Af Amer: >60 mL/min (ref >60)
Glucose, Bld: 96 mg/dL (ref 70–99)
Potassium: 3.9 mmol/L (ref 3.5–5.1)
Sodium: 140 mmol/L (ref 135–145)
Total Bilirubin: 0.6 mg/dL (ref 0.3–1.2)
Total Protein: 6.3 g/dL — ABNORMAL LOW (ref 6.5–8.1)

## 2019-02-05 LAB — MAGNESIUM: Magnesium: 1.7 mg/dL (ref 1.7–2.4)

## 2019-02-05 NOTE — Progress Notes (Signed)
[name], female   DOB: 1955/07/08, 64 y.o.   MRN: [id number]  PROGRESS NOTE    Marie Woods  DUK:[id number] DOB: [birth year] DOA: 12/13/2018 PCP: Marcine Matar, MD   Brief Narrative:  64 year old female with history of asthma, hypertension, alcohol abuse was brought to the hospital confused and altered.  She was found to have UTI; urine culture showed multiple species.  MRI brain was suggestive of possible Wernicke's encephalopathy.  Treated with thiamine.  PT recommended SNF placement.  Assessment & Plan:   Severe sepsis: Present on admission UTI -Urine culture showed multiple species.  Repeat culture showed less than 10,000 colonies.  Was initially treated with IV Rocephin and fluids.  Subsequently antibiotics have been discontinued. -Currently afebrile and hemodynamically stable.  Sepsis has resolved.  Chest x-ray was negative for infection.  Acute metabolic encephalopathy Possible Wernicke's encephalopathy -CT of the head showed mild atrophic changes without acute intracranial abnormality -MRI of the brain: Symmetric diffusion weighted and T2/FLAIR hyperintense signal abnormality within the medial thalami.  Findings favor sequelae of Wernicke's encephalopathy There was question of patient having alcohol abuse however alcohol level less than 10.  Urine drug screen negative -Completed IV thiamine.  Currently on oral thiamine now.  -Continue folic acid, multivitamin. -Outpatient follow-up with neurology. -Mental status still fluctuating.  Suspect underlying dementia with memory issues.    Agitation -Patient was extremely agitated 2 days ago and was threatening to leave his medical advice.  Use as needed Haldol/Ativan.  Psych consult appreciated.  No need for inpatient psychiatric hospitalization.  Essential hypertension -Continue Norvasc.  History of alcohol abuse -Treated with CIWA protocol.  Continue folate, thiamine and multivitamin.   History of tobacco  abuse -Continue nicotine patch   Generalized deconditioning -PT/OT recommending SNF.  Social worker following. -Feel that patient does need supervision as she appears to have short-term memory impairment.  We discuss the reason for her hospitalization on a daily basis but she does not remember the next day.  DVT prophylaxis: Lovenox Code Status: Full Family Communication: None at bedside Disposition Plan: SNF once bed is available  Consultants: Neurology via phone  Procedures: None  Antimicrobials:  Anti-infectives (From admission, onward)   Start     Dose/Rate Route Frequency Ordered Stop   12/14/18 0100  cefTRIAXone (ROCEPHIN) 1 g in sodium chloride 0.9 % 100 mL IVPB  Status:  Discontinued     1 g 200 mL/hr over 30 Minutes Intravenous Every 24 hours 12/14/18 0010 12/17/18 0948       Subjective: Patient seen and examined at bedside.  Poor historian.  No overnight fever or vomiting reported. Objective: Vitals:   02/03/19 2328 02/04/19 0747 02/04/19 1645 02/04/19 2313  BP: 102/71 110/67 98/64 106/78  Pulse: 84 75 97 79  Resp: 12  16 14   Temp: 97.8 F (36.6 C) 98.5 F (36.9 C) 98.3 F (36.8 C) 97.7 F (36.5 C)  TempSrc:  Oral Oral   SpO2: 95% 99% 100% 100%  Weight:      Height:        Intake/Output Summary (Last 24 hours) at 02/05/2019 0751 Last data filed at 02/04/2019 1325 Gross per 24 hour  Intake 640 ml  Output -  Net 640 ml   Filed Weights   12/15/18 0101  Weight: 98.4 kg    Examination:  General exam: No distress.  Looks chronically ill.  Looks older than stated age.  Currently no signs of agitation. Respiratory system: Bilateral decreased breath sounds  at bases with scattered crackles.   Cardiovascular system: S1-S2 heard, rate controlled Gastrointestinal system: Abdomen is nondistended, soft and nontender. Normal bowel sounds heard. Extremities: No cyanosis or clubbing.  Trace lower extremity edema.  Data Reviewed: I have personally reviewed  following labs and imaging studies  CBC: Recent Labs  Lab 02/05/19 0514  WBC 6.7  NEUTROABS 2.9  HGB 11.4*  HCT 34.7*  MCV 88.7  PLT 098   Basic Metabolic Panel: Recent Labs  Lab 01/31/19 0415 02/05/19 0514  NA  --  140  K  --  3.9  CL  --  108  CO2  --  23  GLUCOSE  --  96  BUN  --  13  CREATININE 0.67 0.68  CALCIUM  --  9.4  MG  --  1.7   GFR: Estimated Creatinine Clearance: 89 mL/min (by C-G formula based on SCr of 0.68 mg/dL). Liver Function Tests: Recent Labs  Lab 02/05/19 0514  AST 15  ALT 15  ALKPHOS 36*  BILITOT 0.6  PROT 6.3*  ALBUMIN 3.0*   No results for input(s): LIPASE, AMYLASE in the last 168 hours. No results for input(s): AMMONIA in the last 168 hours. Coagulation Profile: No results for input(s): INR, PROTIME in the last 168 hours. Cardiac Enzymes: No results for input(s): CKTOTAL, CKMB, CKMBINDEX, TROPONINI in the last 168 hours. BNP (last 3 results) No results for input(s): PROBNP in the last 8760 hours. HbA1C: No results for input(s): HGBA1C in the last 72 hours. CBG: No results for input(s): GLUCAP in the last 168 hours. Lipid Profile: No results for input(s): CHOL, HDL, LDLCALC, TRIG, CHOLHDL, LDLDIRECT in the last 72 hours. Thyroid Function Tests: No results for input(s): TSH, T4TOTAL, FREET4, T3FREE, THYROIDAB in the last 72 hours. Anemia Panel: No results for input(s): VITAMINB12, FOLATE, FERRITIN, TIBC, IRON, RETICCTPCT in the last 72 hours. Sepsis Labs: No results for input(s): PROCALCITON, LATICACIDVEN in the last 168 hours.  No results found for this or any previous visit (from the past 240 hour(s)).       Radiology Studies: No results found.      Scheduled Meds: . amLODipine  5 mg Oral Daily  . enoxaparin (LOVENOX) injection  40 mg Subcutaneous Q24H  . feeding supplement (ENSURE ENLIVE)  237 mL Oral BID BM  . folic acid  1 mg Oral Daily  . ketoconazole   Topical BID  . multivitamin with minerals  1 tablet  Oral Daily  . nicotine  21 mg Transdermal Daily  . thiamine  100 mg Oral Daily   Continuous Infusions:         Aline August, MD [facility] 02/05/2019, 7:51 AM

## 2019-02-05 NOTE — Plan of Care (Signed)

## 2019-02-06 MED ORDER — HALOPERIDOL LACTATE 5 MG/ML IJ SOLN
5.0000 mg | Freq: Four times a day (QID) | INTRAMUSCULAR | Status: DC | PRN
  Administered 2019-02-06 – 2019-02-18 (×4): 5 mg via INTRAMUSCULAR
  Filled 2019-02-06 (×3): qty 1

## 2019-02-06 MED ORDER — LORAZEPAM 2 MG/ML IJ SOLN
1.0000 mg | Freq: Four times a day (QID) | INTRAMUSCULAR | Status: DC | PRN
  Filled 2019-02-06: qty 1

## 2019-02-06 NOTE — TOC Progression Note (Signed)
Transition of Care [facility]) - Progression Note    Patient Details  Name: Marie Woods MRN: [id number] Date of Birth: Nov 16, 1955  [facility]) CM/SW Contact  Kermit Balo, RN Phone Number: 02/06/2019, 3:09 PM  Clinical Narrative:    No bed offers. TOC continuing to follow.   Expected Discharge Plan: Skilled Nursing Facility Barriers to Discharge: No SNF bed, SNF Pending payor source - LOG  Expected Discharge Plan and Services Expected Discharge Plan: Skilled Nursing Facility In-house Referral: Clinical Social Work Discharge Planning Services: NA Post Acute Care Choice: Skilled Nursing Facility Living arrangements for the past 2 months: Apartment                 DME Arranged: N/A DME Agency: NA                   Social Determinants of Health (SDOH) Interventions    Readmission Risk Interventions No flowsheet data found.

## 2019-02-06 NOTE — Progress Notes (Signed)
[name], female   DOB: 09/30/1955, 64 y.o.   MRN: [id number]  PROGRESS NOTE    Marie Woods  PIR:[id number] DOB: 05/28/1955 DOA: 12/13/2018 PCP: Ladell Pier, MD   Brief Narrative:  64 year old female with history of asthma, hypertension, alcohol abuse was brought to the hospital confused and altered.  She was found to have UTI; urine culture showed multiple species.  MRI brain was suggestive of possible Wernicke's encephalopathy.  Treated with thiamine.  PT recommended SNF placement.  Assessment & Plan:   Severe sepsis: Present on admission UTI -Urine culture showed multiple species.  Repeat culture showed less than 10,000 colonies.  Was initially treated with IV Rocephin and fluids.  Subsequently antibiotics have been discontinued. -Sepsis has resolved.  Chest x-ray was negative for infection. -Currently hemodynamically stable  Acute metabolic encephalopathy Possible Wernicke's encephalopathy -CT of the head showed mild atrophic changes without acute intracranial abnormality -MRI of the brain: Symmetric diffusion weighted and T2/FLAIR hyperintense signal abnormality within the medial thalami.  Findings favor sequelae of Wernicke's encephalopathy There was question of patient having alcohol abuse however alcohol level less than 10.  Urine drug screen negative -Completed IV thiamine.  Currently on oral thiamine now.  -Continue folic acid, multivitamin. -Outpatient follow-up with neurology. -Mental status still fluctuating.  Suspect underlying dementia with memory issues.    Agitation -Currently calm.  Use as needed Haldol/Ativan.  Psych consult appreciated.  No need for inpatient psychiatric hospitalization.  Essential hypertension -Continue Norvasc.  History of alcohol abuse -Treated with CIWA protocol.  Continue folate, thiamine and multivitamin.   History of tobacco abuse -Continue nicotine patch   Generalized deconditioning -PT/OT recommending  SNF.  Social worker following. -Feel that patient does need supervision as she appears to have short-term memory impairment.  We discuss the reason for her hospitalization on a daily basis but she does not remember the next day.  DVT prophylaxis: Lovenox Code Status: Full Family Communication: None at bedside Disposition Plan: SNF once bed is available.  Currently medically stable for discharge  Consultants: Neurology via phone  Procedures: None  Antimicrobials:  Anti-infectives (From admission, onward)   Start     Dose/Rate Route Frequency Ordered Stop   12/14/18 0100  cefTRIAXone (ROCEPHIN) 1 g in sodium chloride 0.9 % 100 mL IVPB  Status:  Discontinued     1 g 200 mL/hr over 30 Minutes Intravenous Every 24 hours 12/14/18 0010 12/17/18 0948       Subjective: Patient seen and examined at bedside.  Poor historian.  No agitation, fever or vomiting reported on nursing staff.   Objective: Vitals:   02/04/19 2313 02/05/19 0820 02/05/19 1719 02/05/19 2228  BP: 106/78 106/71 113/79 127/90  Pulse: 79 78 83 84  Resp: 14 19 20 20   Temp: 97.7 F (36.5 C) 98.2 F (36.8 C) 97.7 F (36.5 C) 98.3 F (36.8 C)  TempSrc:  Oral Oral   SpO2: 100% 98% 100% 99%  Weight:      Height:       No intake or output data in the 24 hours ending 02/06/19 0739 Filed Weights   12/15/18 0101  Weight: 98.4 kg    Examination:  General exam: No acute distress.  Looks chronically ill.  64 year old female  Currently not agitated.  Awake but pleasantly confused. Respiratory system: Bilateral decreased breath sounds at bases with scattered crackles.   Cardiovascular system: S1-S2 heard, rate controlled Gastrointestinal system: Abdomen is nondistended, soft and nontender.  Normal bowel sounds heard. Extremities: No cyanosis or clubbing.  Trace lower extremity edema.  Data Reviewed: I have personally reviewed following labs and imaging studies  CBC: Recent Labs  Lab 02/05/19 0514  WBC 6.7    NEUTROABS 2.9  HGB 11.4*  HCT 34.7*  MCV 88.7  PLT 284   Basic Metabolic Panel: Recent Labs  Lab 01/31/19 0415 02/05/19 0514  NA  --  140  K  --  3.9  CL  --  108  CO2  --  23  GLUCOSE  --  96  BUN  --  13  CREATININE 0.67 0.68  CALCIUM  --  9.4  MG  --  1.7   GFR: Estimated Creatinine Clearance: 89 mL/min (by C-G formula based on SCr of 0.68 mg/dL). Liver Function Tests: Recent Labs  Lab 02/05/19 0514  AST 15  ALT 15  ALKPHOS 36*  BILITOT 0.6  PROT 6.3*  ALBUMIN 3.0*   No results for input(s): LIPASE, AMYLASE in the last 168 hours. No results for input(s): AMMONIA in the last 168 hours. Coagulation Profile: No results for input(s): INR, PROTIME in the last 168 hours. Cardiac Enzymes: No results for input(s): CKTOTAL, CKMB, CKMBINDEX, TROPONINI in the last 168 hours. BNP (last 3 results) No results for input(s): PROBNP in the last 8760 hours. HbA1C: No results for input(s): HGBA1C in the last 72 hours. CBG: No results for input(s): GLUCAP in the last 168 hours. Lipid Profile: No results for input(s): CHOL, HDL, LDLCALC, TRIG, CHOLHDL, LDLDIRECT in the last 72 hours. Thyroid Function Tests: No results for input(s): TSH, T4TOTAL, FREET4, T3FREE, THYROIDAB in the last 72 hours. Anemia Panel: No results for input(s): VITAMINB12, FOLATE, FERRITIN, TIBC, IRON, RETICCTPCT in the last 72 hours. Sepsis Labs: No results for input(s): PROCALCITON, LATICACIDVEN in the last 168 hours.  No results found for this or any previous visit (from the past 240 hour(s)).       Radiology Studies: No results found.      Scheduled Meds: . amLODipine  5 mg Oral Daily  . enoxaparin (LOVENOX) injection  40 mg Subcutaneous Q24H  . feeding supplement (ENSURE ENLIVE)  237 mL Oral BID BM  . folic acid  1 mg Oral Daily  . ketoconazole   Topical BID  . multivitamin with minerals  1 tablet Oral Daily  . nicotine  21 mg Transdermal Daily  . thiamine  100 mg Oral Daily    Continuous Infusions:         Glade Lloyd, MD [facility] 02/06/2019, 7:39 AM

## 2019-02-07 DIAGNOSIS — R451 Restlessness and agitation: Secondary | ICD-10-CM

## 2019-02-07 LAB — CREATININE, SERUM
Creatinine, Ser: 0.59 mg/dL (ref 0.44–1.00)
GFR calc Af Amer: >60 mL/min (ref >60)
GFR calc non Af Amer: >60 mL/min (ref >60)

## 2019-02-07 NOTE — TOC Progression Note (Addendum)
Transition of Care [facility]) - Progression Note    Patient Details  Name: ROLA LENNON MRN: [id number] Date of Birth: 15-Aug-1955  Transition of Care [facility]) CM/SW Contact  Mearl Latin, LCSW Phone Number: 02/07/2019, 5:08 PM  Clinical Narrative:    5pm-CSW completed Medicaid/Disability consent forms with patient as requested by Arline Asp with [employer]. She is submitting patient's Medicaid and Disability application. Patient asked CSW when she could go home. She provided CSW with permission to speak with her sister, Jamesetta So, to see if patient would have supervision at home. CSW left voicemail for Bray.   5:18pm-Phyllis reports that she is patient's half-sister and is not in good physical health and she lives with her mother so she would not even be able to go and check on patient periodically. She expresses concern for patient's mental status as she is often confused. CSW to continue search for ALF.     Expected Discharge Plan: Skilled Nursing Facility Barriers to Discharge: No SNF bed, SNF Pending payor source - LOG  Expected Discharge Plan and Services Expected Discharge Plan: Skilled Nursing Facility In-house Referral: Clinical Social Work Discharge Planning Services: NA Post Acute Care Choice: Skilled Nursing Facility Living arrangements for the past 2 months: Apartment                 DME Arranged: N/A DME Agency: NA                   Social Determinants of Health (SDOH) Interventions    Readmission Risk Interventions No flowsheet data found.

## 2019-02-07 NOTE — Progress Notes (Signed)
[name], female   DOB: 1956/02/01, 64 y.o.   MRN: [id number]  PROGRESS NOTE    Marie Woods  ZDG:[id number] DOB: Aug 13, 1955 DOA: 12/13/2018 PCP: Marcine Matar, MD   Brief Narrative:  64 year old female with history of asthma, hypertension, alcohol abuse was brought to the hospital confused and altered.  She was found to have UTI; urine culture showed multiple species.  MRI brain was suggestive of possible Wernicke's encephalopathy.  Treated with thiamine.  PT recommended SNF placement.  Assessment & Plan:   Severe sepsis: Present on admission UTI -Urine culture showed multiple species.  Repeat culture showed less than 10,000 colonies.  Was initially treated with IV Rocephin and fluids.  Subsequently antibiotics have been discontinued. -Sepsis has resolved.  Chest x-ray was negative for infection. -Currently hemodynamically stable  Acute metabolic encephalopathy Possible Wernicke's encephalopathy -CT of the head showed mild atrophic changes without acute intracranial abnormality -MRI of the brain: Symmetric diffusion weighted and T2/FLAIR hyperintense signal abnormality within the medial thalami.  Findings favor sequelae of Wernicke's encephalopathy There was question of patient having alcohol abuse however alcohol level less than 10.  Urine drug screen negative -Completed IV thiamine.  Currently on oral thiamine now.  -Continue folic acid, multivitamin. -Outpatient follow-up with neurology. -Mental status still fluctuating.  Suspect underlying dementia with memory issues.    Agitation -Currently calm.  Patient required IM Haldol on 421.  Use as needed Haldol/Ativan.  Psych had evaluated the patient few days ago and stated that there was no need for inpatient psychiatric hospitalization.  Psychiatry subsequently signed off.  Essential hypertension -Continue Norvasc.  History of alcohol abuse -Treated with CIWA protocol.  Continue folate, thiamine and  multivitamin.   History of tobacco abuse -Continue nicotine patch   Generalized deconditioning -PT/OT recommending SNF.  Social worker following. -Feel that patient does need supervision as she appears to have short-term memory impairment.  We discuss the reason for her hospitalization on a daily basis but she does not remember the next day.  DVT prophylaxis: Lovenox Code Status: Full Family Communication: None at bedside Disposition Plan: SNF once bed is available.  Currently medically stable for discharge  Consultants: Neurology via phone  Procedures: None  Antimicrobials:  Anti-infectives (From admission, onward)   Start     Dose/Rate Route Frequency Ordered Stop   12/14/18 0100  cefTRIAXone (ROCEPHIN) 1 g in sodium chloride 0.9 % 100 mL IVPB  Status:  Discontinued     1 g 200 mL/hr over 30 Minutes Intravenous Every 24 hours 12/14/18 0010 12/17/18 0948       Subjective: Patient seen and examined at bedside.  Extremely poor historian.  Patient was agitated yesterday afternoon as per nursing staff.  No overnight fever or vomiting reported.   Objective: Vitals:   02/05/19 2228 02/06/19 0824 02/06/19 1632 02/06/19 2316  BP: 127/90 108/81 108/77 109/80  Pulse: 84 73 90 (!) 56  Resp: 20 19 19 20   Temp: 98.3 F (36.8 C) 98.1 F (36.7 C) 98.6 F (37 C) 98.2 F (36.8 C)  TempSrc:  Oral    SpO2: 99% 98% 100% 100%  Weight:      Height:        Intake/Output Summary (Last 24 hours) at 02/07/2019 0744 Last data filed at 02/06/2019 0745 Gross per 24 hour  Intake 240 ml  Output --  Net 240 ml   Filed Weights   12/15/18 0101  Weight: 98.4 kg    Examination:  General  exam: No distress.  Looks chronically ill.  Looks older than stated age.  Currently calm.  Sleepy, wakes up slightly, hardly answers any questions. Respiratory system: Bilateral decreased breath sounds at bases with some crackles.  No wheezing.   Cardiovascular system: Rate controlled, S1-S2  heard Gastrointestinal system: Abdomen is nondistended, soft and nontender. Normal bowel sounds heard. Extremities: No clubbing or cyanosis.  Trace lower extremity edema.  Data Reviewed: I have personally reviewed following labs and imaging studies  CBC: Recent Labs  Lab 02/05/19 0514  WBC 6.7  NEUTROABS 2.9  HGB 11.4*  HCT 34.7*  MCV 88.7  PLT 332   Basic Metabolic Panel: Recent Labs  Lab 02/05/19 0514 02/07/19 0444  NA 140  --   K 3.9  --   CL 108  --   CO2 23  --   GLUCOSE 96  --   BUN 13  --   CREATININE 0.68 0.59  CALCIUM 9.4  --   MG 1.7  --    GFR: Estimated Creatinine Clearance: 89 mL/min (by C-G formula based on SCr of 0.59 mg/dL). Liver Function Tests: Recent Labs  Lab 02/05/19 0514  AST 15  ALT 15  ALKPHOS 36*  BILITOT 0.6  PROT 6.3*  ALBUMIN 3.0*   No results for input(s): LIPASE, AMYLASE in the last 168 hours. No results for input(s): AMMONIA in the last 168 hours. Coagulation Profile: No results for input(s): INR, PROTIME in the last 168 hours. Cardiac Enzymes: No results for input(s): CKTOTAL, CKMB, CKMBINDEX, TROPONINI in the last 168 hours. BNP (last 3 results) No results for input(s): PROBNP in the last 8760 hours. HbA1C: No results for input(s): HGBA1C in the last 72 hours. CBG: No results for input(s): GLUCAP in the last 168 hours. Lipid Profile: No results for input(s): CHOL, HDL, LDLCALC, TRIG, CHOLHDL, LDLDIRECT in the last 72 hours. Thyroid Function Tests: No results for input(s): TSH, T4TOTAL, FREET4, T3FREE, THYROIDAB in the last 72 hours. Anemia Panel: No results for input(s): VITAMINB12, FOLATE, FERRITIN, TIBC, IRON, RETICCTPCT in the last 72 hours. Sepsis Labs: No results for input(s): PROCALCITON, LATICACIDVEN in the last 168 hours.  No results found for this or any previous visit (from the past 240 hour(s)).       Radiology Studies: No results found.      Scheduled Meds: . amLODipine  5 mg Oral Daily  .  enoxaparin (LOVENOX) injection  40 mg Subcutaneous Q24H  . feeding supplement (ENSURE ENLIVE)  237 mL Oral BID BM  . folic acid  1 mg Oral Daily  . ketoconazole   Topical BID  . multivitamin with minerals  1 tablet Oral Daily  . nicotine  21 mg Transdermal Daily  . thiamine  100 mg Oral Daily   Continuous Infusions:         Aline August, MD [facility] 02/07/2019, 7:44 AM

## 2019-02-07 NOTE — Progress Notes (Signed)
Nutrition Follow-up  DOCUMENTATION CODES:   Obesity unspecified  INTERVENTION:   -Continue Ensure supplements and daily MVI  -RD to sign off at this time as interventions have not changed and pt deemed stable for discharge  NUTRITION DIAGNOSIS:   Increased nutrient needs related to acute illness as evidenced by estimated needs.  Improved. Per MD, stable for discharge.  GOAL:   Patient will meet greater than or equal to 90% of their needs  PO intakes: 100%  MONITOR:   PO intake, Supplement acceptance, Labs, Weight trends  ASSESSMENT:   64 year old female who presented to the ED on 11/10 with AMS. PMH of asthma, EtOH abuse, HTN. Pt admitted with severe sepsis secondary to UTI. Pt found to have Wernicke's encephalopathy.  **RD working remotely**  Per MD note, pt continues to be medically stable for discharge. Awaiting placement.  Ptcurrently consuming100% of mealsand is drinking supplements that are ordered.   No further nutrition interventions identified at this time.  Admission weight: 217 lbs. No new weights since 11/12.   Medications: Folic acid tablet, Multivitamin with minerals daily, Thiamine tablet Labs reviewed: Mg WNL  Diet Order:   Diet Order            Diet Heart Room service appropriate? No; Fluid consistency: Thin  Diet effective now              EDUCATION NEEDS:   Not appropriate for education at this time  Skin:  Skin Assessment: Reviewed RN Assessment  Last BM:  1/3  Height:   Ht Readings from Last 1 Encounters:  12/15/18 5\' 7"  (1.702 m)    Weight:   Wt Readings from Last 1 Encounters:  12/15/18 98.4 kg    Ideal Body Weight:  61.4 kg  BMI:  Body mass index is 33.99 kg/m.  Estimated Nutritional Needs:   Kcal:  1900-2100  Protein:  95-110 grams  Fluid:  >/= 1.8 L  13/12/20, MS, RD, LDN Inpatient Clinical Dietitian Pager: [phone] After Hours Pager: [phone]

## 2019-02-07 NOTE — Progress Notes (Signed)
Physical Therapy Treatment and DISCHARGE  Patient Details Name: Marie Woods MRN: [id number] DOB: 1955/08/23 Today's Date: 02/07/2019    History of Present Illness Patient is a 64 year old female with medical history significant of asthma, hypertension, alcohol abuse who was brought in by EMS after neighbors called in stating that patient is confused and altered. Patient admitted Severe Sepsis with Acute metabolic encephalopathy secondary to UTI.     PT Comments    Pt has met all goals and is at her baseline function. Pt awaiting SNF bed placement via LOG. Due to cognitive impairments suspect patient at new baseline. Pt functioning at supervision level with RW but can amb in room without AD as pt furniture walks. Pt with decreased insight to safety and deficits with mood changes at night. At this time acute PT as progressed her to baseline. Pt with no further acute PT needs at this time. PT SIGNING OFF. Please re-consult in future if needed.   Follow Up Recommendations  SNF     Equipment Recommendations  Rolling walker with 5" wheels    Recommendations for Other Services       Precautions / Restrictions Precautions Precautions: Fall Precaution Comments: RN reports worse confusion and agitation at night, s/p 5pm Restrictions Weight Bearing Restrictions: No    Mobility  Bed Mobility               General bed mobility comments: OOB in recliner  Transfers Overall transfer level: Needs assistance Equipment used: Rolling walker (2 wheeled) Transfers: Sit to/from Stand Sit to Stand: Supervision         General transfer comment: verbal cues for safe hand placement and manuevering of walker when returning to sit, pt pushing walker to the side and then furniture walking to chair  Ambulation/Gait Ambulation/Gait assistance: Supervision Gait Distance (Feet): 350 Feet Assistive device: Rolling walker (2 wheeled) Gait Pattern/deviations: Step-through pattern Gait  velocity: dec Gait velocity interpretation: 1.31 - 2.62 ft/sec, indicative of limited community ambulator General Gait Details: pt more upright today, pt continues with R knee instability but able to amb without LOB   Stairs             Wheelchair Mobility    Modified Rankin (Stroke Patients Only)       Balance Overall balance assessment: Needs assistance Sitting-balance support: No upper extremity supported;Feet supported Sitting balance-Leahy Scale: Normal     Standing balance support: No upper extremity supported Standing balance-Leahy Scale: Fair                              Cognition Arousal/Alertness: Awake/alert Behavior During Therapy: WFL for tasks assessed/performed Overall Cognitive Status: History of cognitive impairments - at baseline                                 General Comments: Pt with psych history. Pt participatory in therapy and followed commands. per RN the last few nights pt has become agressive and trying to leave the hospital      Exercises      General Comments General comments (skin integrity, edema, etc.): VSS      Pertinent Vitals/Pain Pain Assessment: No/denies pain    Home Living                      Prior Function  PT Goals (current goals can now be found in the care plan section) Progress towards PT goals: Goals met/education completed, patient discharged from PT    Frequency           PT Plan Other (comment)(pt d/c from PT services)    Co-evaluation              AM-PAC PT "6 Clicks" Mobility   Outcome Measure  Help needed turning from your back to your side while in a flat bed without using bedrails?: None Help needed moving from lying on your back to sitting on the side of a flat bed without using bedrails?: None Help needed moving to and from a bed to a chair (including a wheelchair)?: None Help needed standing up from a chair using your arms (e.g.,  wheelchair or bedside chair)?: None Help needed to walk in hospital room?: A Little Help needed climbing 3-5 steps with a railing? : A Little 6 Click Score: 22    End of Session Equipment Utilized During Treatment: Gait belt Activity Tolerance: Patient tolerated treatment well Patient left: in chair;with call bell/phone within reach Nurse Communication: Mobility status PT Visit Diagnosis: Other abnormalities of gait and mobility (R26.89);Muscle weakness (generalized) (M62.81);Difficulty in walking, not elsewhere classified (R26.2)     Time: 0086-7619 PT Time Calculation (min) (ACUTE ONLY): 10 min  Charges:  $Gait Training: 8-22 mins                     Kittie Plater, PT, DPT Acute Rehabilitation Services Pager #: [phone] Office #: [phone]    Berline Lopes 02/07/2019, 1:45 PM

## 2019-02-08 DIAGNOSIS — R451 Restlessness and agitation: Secondary | ICD-10-CM

## 2019-02-08 DIAGNOSIS — G9341 Metabolic encephalopathy: Secondary | ICD-10-CM

## 2019-02-08 DIAGNOSIS — N390 Urinary tract infection, site not specified: Secondary | ICD-10-CM

## 2019-02-08 DIAGNOSIS — A419 Sepsis, unspecified organism: Principal | ICD-10-CM

## 2019-02-08 NOTE — TOC Progression Note (Addendum)
Transition of Care [facility]) - Progression Note    Patient Details  Name: Marie Woods MRN: [id number] Date of Birth: Jun 24, 1955  [facility]) CM/SW Contact  Mearl Latin, LCSW Phone Number: 02/08/2019, 12:15 PM  Clinical Narrative:    CSW discussed case at [facility]. Per Medical Advisor, a diagnosis of Dementia may assist with Medicaid approval. CSW spoke with [facility]. They are a Memory Care only facility and require a Dementia diagnosis. Faxed clinicals to [facility]. CSW to continue to follow.    Expected Discharge Plan: Skilled Nursing Facility Barriers to Discharge: No SNF bed, SNF Pending payor source - LOG  Expected Discharge Plan and Services Expected Discharge Plan: Skilled Nursing Facility In-house Referral: Clinical Social Work Discharge Planning Services: NA Post Acute Care Choice: Skilled Nursing Facility Living arrangements for the past 2 months: Apartment                 DME Arranged: N/A DME Agency: NA                   Social Determinants of Health (SDOH) Interventions    Readmission Risk Interventions No flowsheet data found.

## 2019-02-08 NOTE — Progress Notes (Signed)
Occupational Therapy Treatment Patient Details Name: Marie Woods MRN: [id number] DOB: January 29, 1956 Today's Date: 02/08/2019    History of present illness Patient is a 64 year old female with medical history significant of asthma, hypertension, alcohol abuse who was brought in by EMS after neighbors called in stating that patient is confused and altered. Patient admitted Severe Sepsis with Acute metabolic encephalopathy secondary to UTI.    OT comments  Patient agreeable for OT session.  Completing transfer with min guard to recliner, as pt reaching out for UE support.  Engaged in pill box test to assess functional cognition. Pt failed the assessment, demonstrating poor planning, attention, sequencing, mental flexibility, suboptimal search strategies, concrete thinking and the inability to multitask. Pt had a total of 13 errors, where more than 3 errors is considered a fail.  Patient verbalized understanding and agreeable to recommendations of assist with all IADLs at this time, demonstrating improving awareness to deficits voicing "My attention is just not where it was". Will follow acutely.     Follow Up Recommendations  SNF;Supervision/Assistance - 24 hour    Equipment Recommendations  3 in 1 bedside commode    Recommendations for Other Services      Precautions / Restrictions Precautions Precautions: Fall Precaution Comments: RN reports worse confusion and agitation at night, s/p 5pm Restrictions Weight Bearing Restrictions: No       Mobility Bed Mobility Overal bed mobility: Modified Independent                Transfers Overall transfer level: Needs assistance Equipment used: 1 person hand held assist Transfers: Sit to/from Stand Sit to Stand: Supervision Stand pivot transfers: Min guard       General transfer comment: reachign for 1 UE support with stand pivot to recliner     Balance Overall balance assessment: Needs assistance Sitting-balance support: No  upper extremity supported;Feet supported Sitting balance-Leahy Scale: Normal     Standing balance support: Single extremity supported;During functional activity Standing balance-Leahy Scale: Fair                             ADL either performed or assessed with clinical judgement   ADL                           Toilet Transfer: Min Designer, jewellery Details (indicate cue type and reason): patient reaching out for 1 UE support during pivot to recliner          Functional mobility during ADLs: Min guard General ADL Comments: session focused on pill box test for cognition     Vision       Perception     Praxis      Cognition Arousal/Alertness: Awake/alert Behavior During Therapy: WFL for tasks assessed/performed Overall Cognitive Status: History of cognitive impairments - at baseline Area of Impairment: Orientation;Attention;Memory;Following commands;Safety/judgement;Awareness;Problem solving                 Orientation Level: Disoriented to;Time Current Attention Level: Selective Memory: Decreased short-term memory Following Commands: Follows one step commands consistently;Follows one step commands with increased time;Follows multi-step commands inconsistently Safety/Judgement: Decreased awareness of safety;Decreased awareness of deficits Awareness: Emergent Problem Solving: Requires verbal cues;Difficulty sequencing General Comments: patient engaged in pill box test, see below for details    Assessed using the Pill Box Test. Pt failed the assessment, demonstrating poor planning, mental flexibility, suboptimal search strategies, concrete thinking  and the inability to multitask. Pt had a total of 13 errors, where more than 3 errors is considered a fail.  Errors: One tablet 3x/day (yellow) - 2 errors (omission/misplacement) One tablet 2x/day with breakfast and dinner (green) - 0 errors (omission/misplacement) One tablet in the  morning (Blue)- 0 errors (omission/misplacement) One tablet daily at bedtime (orange) - 0 errors(omission/misplacement) One tablet every other day (red) - 0 errors (omission/misplacement)   Number of misplaced movement errors (pills placed in incorrect compartment)- 11 Number of total errors (sum of omissions; misplacements) - 13 Total time to complete task (allowed 5 min) - 8 min  Discussed errors with patient, who verbalized understanding.  She demonstrated difficulty attending to task, organizing medication and problem solving as seen through repeating 1 pill twice, partially completing 1 pill then returning to it at the end and running out of pills to place in pill box.  Pt agreeable to recommendations of having assist with medication mgmt and IADLs as this time.       Exercises     Shoulder Instructions       General Comments      Pertinent Vitals/ Pain       Pain Assessment: No/denies pain  Home Living                                          Prior Functioning/Environment              Frequency  Min 2X/week        Progress Toward Goals  OT Goals(current goals can now be found in the care plan section)  Progress towards OT goals: Progressing toward goals  Acute Rehab OT Goals Patient Stated Goal: return to PLOF OT Goal Formulation: With patient  Plan Frequency remains appropriate;Discharge plan remains appropriate    Co-evaluation                 AM-PAC OT "6 Clicks" Daily Activity     Outcome Measure   Help from another person eating meals?: None Help from another person taking care of personal grooming?: A Little Help from another person toileting, which includes using toliet, bedpan, or urinal?: A Little Help from another person bathing (including washing, rinsing, drying)?: A Little Help from another person to put on and taking off regular upper body clothing?: None Help from another person to put on and taking off regular  lower body clothing?: A Little 6 Click Score: 20    End of Session    OT Visit Diagnosis: Unsteadiness on feet (R26.81);Other abnormalities of gait and mobility (R26.89);Muscle weakness (generalized) (M62.81);Other symptoms and signs involving cognitive function   Activity Tolerance Patient tolerated treatment well   Patient Left in chair;with call bell/phone within reach;with chair alarm set   Nurse Communication Mobility status        Time: 2119-4174 OT Time Calculation (min): 21 min  Charges: OT General Charges $OT Visit: 1 Visit OT Treatments $Cognitive Funtion inital: Initial 15 mins $Cognitive Funtion additional: Additional15 mins  Barry Brunner, OT Acute Rehabilitation Services Pager [phone] Office [phone]    Chancy Milroy 02/08/2019, 12:34 PM

## 2019-02-08 NOTE — [place] FL2 (Signed)
[place] MEDICAID FL2 LEVEL OF CARE SCREENING TOOL     IDENTIFICATION  Patient Name: Marie Woods Birthdate: Mar 26, 1955 Sex: female Admission Date (Current Location): 12/13/2018  [facility] and [place] Number:  [place] Medicaid Pending Facility and Address:  [facility], [address] [place], [place] [address]      Provider Number: [id number]  Attending Physician Name and Address:  Rodolph Bong, MD  Relative Name and Phone Number:  Sheria Lang, Niece, [phone]    Current Level of Care: Hospital Recommended Level of Care: Assisted Living Facility Prior Approval Number:    Date Approved/Denied:   PASRR Number: Under Manual Review  Discharge Plan: Other (Comment)(ALF)    Current Diagnoses: Patient Active Problem List   Diagnosis Date Noted  . Confusion   . AMS (altered mental status) 12/13/2018  . Chronic pain of left knee 04/12/2017  . Chronic pain of right knee 04/12/2017  . ASCUS with positive high risk HPV cervical 05/23/2015  . Healthcare maintenance 03/28/2015  . Alopecia 06/13/2014  . DEPRESSION 04/03/2010  . TOBACCO ABUSE 06/28/2009  . ALLERGIC RHINITIS 06/28/2009  . History of alcohol use 01/10/2009  . Essential hypertension 01/10/2009  . Asthma 01/10/2009  . Dermatitis, atopic 01/10/2009    Orientation RESPIRATION BLADDER Height & Weight     Self, Situation, Place(Fluctuates)  Normal Continent Weight: 217 lb (98.4 kg) Height:  5\' 7"  (170.2 cm)  BEHAVIORAL SYMPTOMS/MOOD NEUROLOGICAL BOWEL NUTRITION STATUS  (None)   Continent Diet(Regular)  AMBULATORY STATUS COMMUNICATION OF NEEDS Skin   Supervision Verbally Normal                       Personal Care Assistance Level of Assistance  Bathing, Feeding, Dressing Bathing Assistance: Limited assistance Feeding assistance: Independent Dressing Assistance: Limited assistance Total Care Assistance: Limited assistance   Functional Limitations Info  Sight,  Hearing, Speech Sight Info: Adequate Hearing Info: Adequate Speech Info: Adequate    SPECIAL CARE FACTORS FREQUENCY  PT (By licensed PT)     PT Frequency: 3x/week OT Frequency: 5x/wk            Contractures Contractures Info: Not present    Additional Factors Info  Code Status, Allergies Code Status Info: Full Code Allergies Info: No Known Allergies           Current Medications (02/08/2019):  This is the current hospital active medication list Current Facility-Administered Medications  Medication Dose Route Frequency Provider Last Rate Last Admin  . acetaminophen (TYLENOL) tablet 650 mg  650 mg Oral Q6H PRN 04/08/2019, MD   650 mg at 02/08/19 1027   Or  . acetaminophen (TYLENOL) suppository 650 mg  650 mg Rectal Q6H PRN 04/08/19, MD      . albuterol (PROVENTIL) (2.5 MG/3ML) 0.083% nebulizer solution 2.5 mg  2.5 mg Nebulization Q4H PRN Alekh, Kshitiz, MD      . alum & mag hydroxide-simeth (MAALOX/MYLANTA) 200-200-20 MG/5ML suspension 30 mL  30 mL Oral Q6H PRN 08-11-2000 D, MD      . amLODipine (NORVASC) tablet 5 mg  5 mg Oral Daily Sara Chu, MD   5 mg at 02/08/19 0839  . camphor-menthol (SARNA) lotion   Topical PRN 04/08/19, NP   Given at 01/28/19 [phone]  . diphenhydrAMINE (BENADRYL) capsule 25 mg  25 mg Oral Q8H PRN [id number], NP   25 mg at 02/06/19 0123  . enoxaparin (LOVENOX) injection 40  mg  40 mg Subcutaneous Q24H Gala Romney L, MD   40 mg at 02/08/19 0546  . feeding supplement (ENSURE ENLIVE) (ENSURE ENLIVE) liquid 237 mL  237 mL Oral BID BM Adele Barthel D, MD   237 mL at 02/08/19 0842  . folic acid (FOLVITE) tablet 1 mg  1 mg Oral Daily Mikhail, Bushyhead, DO   1 mg at 02/08/19 0840  . haloperidol lactate (HALDOL) injection 5 mg  5 mg Intramuscular Q6H PRN Aline August, MD   5 mg at 02/06/19 1748  . hydrocerin (EUCERIN) cream 2 application  2 application Topical PRN Blain Pais, MD   2 application at 25/95/63 2100  .  hydrOXYzine (ATARAX/VISTARIL) tablet 25 mg  25 mg Oral BID PRN Eulogio Bear U, DO   25 mg at 02/07/19 1232  . ketoconazole (NIZORAL) 2 % cream   Topical BID Harold Hedge, MD   Given at 02/08/19 [phone]  . loratadine (CLARITIN) tablet 10 mg  10 mg Oral Daily PRN Aline August, MD   10 mg at 02/04/19 1025  . LORazepam (ATIVAN) injection 1 mg  1 mg Intramuscular Q6H PRN Aline August, MD      . meclizine (ANTIVERT) tablet 12.5 mg  12.5 mg Oral TID PRN Adele Barthel D, MD   12.5 mg at 01/09/19 1218  . multivitamin with minerals tablet 1 tablet  1 tablet Oral Daily Cristal Ford, DO   1 tablet at 02/08/19 0840  . nicotine (NICODERM CQ - dosed in mg/24 hours) patch 21 mg  21 mg Transdermal Daily Elwyn Reach, MD   21 mg at 02/08/19 4332  . ondansetron (ZOFRAN) tablet 4 mg  4 mg Oral Q6H PRN Elwyn Reach, MD       Or  . ondansetron (ZOFRAN) injection 4 mg  4 mg Intravenous Q6H PRN Gala Romney L, MD      . phenol (CHLORASEPTIC) mouth spray 1 spray  1 spray Mouth/Throat PRN Cristal Ford, DO   1 spray at 12/19/18 0818  . thiamine (VITAMIN B-1) tablet 100 mg  100 mg Oral Daily Starla Link, Kshitiz, MD   100 mg at 02/08/19 0840  . traMADol (ULTRAM) tablet 50 mg  50 mg Oral Q6H PRN Aline August, MD   50 mg at 02/07/19 0829  . traZODone (DESYREL) tablet 50 mg  50 mg Oral QHS PRN Damita Lack, MD   50 mg at 02/06/19 2027     Discharge Medications: Please see discharge summary for a list of discharge medications.  Relevant Imaging Results:  Relevant Lab Results:   Additional Information SSN: [id number]; COVID negative on 12/13/2018  Lissa Morales Deckard Stuber, LCSW

## 2019-02-08 NOTE — Progress Notes (Signed)
PROGRESS NOTE    Marie Woods  VOJ:[id number] DOB: Apr 07, 1955 DOA: 12/13/2018 PCP: Marcine Matar, MD    Brief Narrative:  64 year old female with history of asthma, hypertension, alcohol abuse was brought to the hospital confused and altered.  She was found to have UTI; urine culture showed multiple species.  MRI brain was suggestive of possible Wernicke's encephalopathy.  Treated with thiamine.  PT recommended SNF placement.    Assessment & Plan:   Principal Problem:   AMS (altered mental status) Active Problems:   History of alcohol use   TOBACCO ABUSE   Essential hypertension   Confusion  1 severe sepsis, POA/UTI Urine cultures which were done showed multiple species.  Repeat culture showed less than 10,000 colonies.  Patient initially being treated with IV Rocephin and fluids.  Completed course of antibiotics.  Sepsis resolved.  Chest x-ray negative for any acute infection.  Patient currently hemodynamically stable.  2.  Acute metabolic encephalopathy likely secondary to Wernicke's encephalopathy CT head which was done showed mild atrophic changes without acute intracranial abnormality.  MRI of the brain done showed symmetric diffusion weighted and T2/flair hyperintense signal abnormality within the medial thalami.  Findings favored sequelae of 1 acute encephalopathy.  There was a question of alcohol abuse.  UDS negative.  Status post completion of IV thiamine.  Continue oral thiamine for now, folic acid, multivitamin.  Will need outpatient follow-up with neurology.  Mental status fluctuating and concerned that patient might have a underlying dementia with some memory issues.  Outpatient follow-up.  3.  Agitation Currently calm.  Patient noted to have required IM Haldol during this hospitalization.  Continue Haldol/Ativan as needed.  Psych evaluated patient and was felt patient did not meet requirements/criteria for inpatient psychiatric hospitalization.  Psychiatry  subsequently signed off.  4.  Hypertension Stable.  Continue Norvasc.  5.  History of alcohol abuse Status post Ativan withdrawal protocol.  Continue thiamine, folate, multivitamin.  6.  History of tobacco abuse Tobacco cessation.  Continue nicotine patch.  7.  Generalized deconditioning Patient seen by PT OT who are recommending SNF.  Patient needs supervision as appears to have short-term memory impairment.  Per Dr. Hanley Ben he discussed reason for patient's hospitalization on a daily basis but does not remember the next day.    DVT prophylaxis: Lovenox Code Status: Full Family Communication: No family at bedside. Disposition Plan: SNF when bed available.   Consultants:   Dr. Jannifer Franklin 02/04/2019  Procedures:   CT head 12/13/2018  Abdominal films 12/15/2018  Chest x-ray 12/14/2018  MRI brain 12/15/2018  Antimicrobials:   IV Rocephin 12/14/2018>>>:>> 12/17/2018   Subjective: Patient sitting up in bed about to eat breakfast.  Denies any chest pain.  No shortness of breath.  States hopes SNF she is going to is in [place] and needed to be in [place].   Objective: Vitals:   02/07/19 0809 02/07/19 1836 02/07/19 2224 02/08/19 0824  BP: 121/85 103/72 129/86 129/87  Pulse: 79 77 83 82  Resp: 15 15 20 19   Temp: 97.9 F (36.6 C) 98 F (36.7 C) (!) 97.4 F (36.3 C) 98.2 F (36.8 C)  TempSrc: Oral Oral Oral Oral  SpO2: 100% 100% 100% 96%  Weight:      Height:        Intake/Output Summary (Last 24 hours) at 02/08/2019 1050 Last data filed at 02/08/2019 1020 Gross per 24 hour  Intake 240 ml  Output --  Net 240 ml   04/08/2019  12/15/18 0101  Weight: 98.4 kg    Examination:  General exam: Appears calm and comfortable  Respiratory system: Clear to auscultation. Respiratory effort normal. Cardiovascular system: S1 & S2 heard, RRR. No JVD, murmurs, rubs, gallops or clicks. No pedal edema. Gastrointestinal system: Soft, nontender, nondistended, positive  bowel sounds. Central nervous system: Alert and oriented. No focal neurological deficits. Extremities: Symmetric 5 x 5 power. Skin: No rashes, lesions or ulcers Psychiatry: Judgement and insight appear poor to fair. Mood & affect appropriate.     Data Reviewed: I have personally reviewed following labs and imaging studies  CBC: Recent Labs  Lab 02/05/19 0514  WBC 6.7  NEUTROABS 2.9  HGB 11.4*  HCT 34.7*  MCV 88.7  PLT 315   Basic Metabolic Panel: Recent Labs  Lab 02/05/19 0514 02/07/19 0444  NA 140  --   K 3.9  --   CL 108  --   CO2 23  --   GLUCOSE 96  --   BUN 13  --   CREATININE 0.68 0.59  CALCIUM 9.4  --   MG 1.7  --    GFR: Estimated Creatinine Clearance: 89 mL/min (by C-G formula based on SCr of 0.59 mg/dL). Liver Function Tests: Recent Labs  Lab 02/05/19 0514  AST 15  ALT 15  ALKPHOS 36*  BILITOT 0.6  PROT 6.3*  ALBUMIN 3.0*   No results for input(s): LIPASE, AMYLASE in the last 168 hours. No results for input(s): AMMONIA in the last 168 hours. Coagulation Profile: No results for input(s): INR, PROTIME in the last 168 hours. Cardiac Enzymes: No results for input(s): CKTOTAL, CKMB, CKMBINDEX, TROPONINI in the last 168 hours. BNP (last 3 results) No results for input(s): PROBNP in the last 8760 hours. HbA1C: No results for input(s): HGBA1C in the last 72 hours. CBG: No results for input(s): GLUCAP in the last 168 hours. Lipid Profile: No results for input(s): CHOL, HDL, LDLCALC, TRIG, CHOLHDL, LDLDIRECT in the last 72 hours. Thyroid Function Tests: No results for input(s): TSH, T4TOTAL, FREET4, T3FREE, THYROIDAB in the last 72 hours. Anemia Panel: No results for input(s): VITAMINB12, FOLATE, FERRITIN, TIBC, IRON, RETICCTPCT in the last 72 hours. Sepsis Labs: No results for input(s): PROCALCITON, LATICACIDVEN in the last 168 hours.  No results found for this or any previous visit (from the past 240 hour(s)).       Radiology Studies: No  results found.      Scheduled Meds: . amLODipine  5 mg Oral Daily  . enoxaparin (LOVENOX) injection  40 mg Subcutaneous Q24H  . feeding supplement (ENSURE ENLIVE)  237 mL Oral BID BM  . folic acid  1 mg Oral Daily  . ketoconazole   Topical BID  . multivitamin with minerals  1 tablet Oral Daily  . nicotine  21 mg Transdermal Daily  . thiamine  100 mg Oral Daily   Continuous Infusions:   LOS: 57 days    Time spent: 35 minutes    Irine Seal, MD [facility]  If 7PM-7AM, please contact night-coverage [email or web address] 02/08/2019, 10:50 AM

## 2019-02-09 NOTE — Progress Notes (Signed)
PROGRESS NOTE    Marie Woods  WCH:[id number] DOB: 09/21/1955 DOA: 12/13/2018 PCP: Marcine Matar, MD    Brief Narrative:  64 year old female with history of asthma, hypertension, alcohol abuse was brought to the hospital confused and altered.  She was found to have UTI; urine culture showed multiple species.  MRI brain was suggestive of possible Wernicke's encephalopathy.  Treated with thiamine.  PT recommended SNF placement.    Assessment & Plan:   Principal Problem:   AMS (altered mental status) Active Problems:   History of alcohol use   TOBACCO ABUSE   Essential hypertension   Confusion   Acute metabolic encephalopathy   Agitation   Acute lower UTI   Sepsis (HCC)  1 severe sepsis, POA/UTI Urine cultures which were done showed multiple species.  Repeat culture showed less than 10,000 colonies.  Patient initially being treated with IV Rocephin and fluids.  Completed course of antibiotics.  Sepsis resolved.  Chest x-ray negative for any acute infection.  Patient currently hemodynamically stable.  2.  Acute metabolic encephalopathy likely secondary to Wernicke's encephalopathy CT head which was done showed mild atrophic changes without acute intracranial abnormality.  MRI of the brain done showed symmetric diffusion weighted and T2/flair hyperintense signal abnormality within the medial thalami.  Findings favored sequelae of 1 acute encephalopathy.  There was a question of alcohol abuse.  UDS negative.  Status post completion of IV thiamine.  Continue oral thiamine, folic acid, multivitamin.  Will need outpatient follow-up with neurology.  Mental status fluctuating and concerned that patient might have a underlying dementia with some memory issues.  Outpatient follow-up.  3.  Agitation Currently calm.  Patient noted to have required IM Haldol during this hospitalization.  Continue Haldol/Ativan as needed.  Psych evaluated patient and was felt patient did not meet  requirements/criteria for inpatient psychiatric hospitalization.  Psychiatry subsequently signed off.  4.  Hypertension Continue current regimen of Norvasc.   5.  History of alcohol abuse Status post Ativan withdrawal protocol.  No signs of withdrawal currently.  Continue thiamine, folate, multivitamin.  6.  History of tobacco abuse Continue nicotine patch.   7.  Generalized deconditioning Patient seen by PT/ OT who are recommending SNF.  Patient needs supervision as appears to have short-term memory impairment.  Per Dr. Hanley Ben he discussed reason for patient's hospitalization on a daily basis but does not remember the next day.    DVT prophylaxis: Lovenox Code Status: Full Family Communication: No family at bedside. Disposition Plan: SNF when bed available.   Consultants:   Dr. Jannifer Franklin 02/04/2019  Procedures:   CT head 12/13/2018  Abdominal films 12/15/2018  Chest x-ray 12/14/2018  MRI brain 12/15/2018  Antimicrobials:   IV Rocephin 12/14/2018>>>:>> 12/17/2018   Subjective: Patient laying in bed.  Denies any chest pain.  No shortness of breath.  No complaints today and feeling better.    Objective: Vitals:   02/07/19 2224 02/08/19 0824 02/08/19 1638 02/08/19 2302  BP: 129/86 129/87 111/63 115/80  Pulse: 83 82 84 81  Resp: 20 19 19 18   Temp: (!) 97.4 F (36.3 C) 98.2 F (36.8 C) (!) 97.5 F (36.4 C) 98.1 F (36.7 C)  TempSrc: Oral Oral Oral Oral  SpO2: 100% 96% 100% 95%  Weight:      Height:        Intake/Output Summary (Last 24 hours) at 02/09/2019 1118 Last data filed at 02/09/2019 0820 Gross per 24 hour  Intake 360 ml  Output -  Net  360 ml   Filed Weights   12/15/18 0101  Weight: 98.4 kg    Examination:  General exam: NAD Respiratory system: CTA B.  No wheezes, no crackles, no rhonchi.  Normal respiratory effort.   Cardiovascular system: Regular rate rhythm no murmurs rubs or gallops.  No JVD.  No lower extremity edema.  Gastrointestinal  system: Soft, nontender, nondistended, positive bowel sounds.  No rebound.  No guarding.  Central nervous system: Alert and oriented. No focal neurological deficits. Extremities: Symmetric 5 x 5 power. Skin: No rashes, lesions or ulcers Psychiatry: Judgement and insight appear poor to fair. Mood & affect appropriate.     Data Reviewed: I have personally reviewed following labs and imaging studies  CBC: Recent Labs  Lab 02/05/19 0514  WBC 6.7  NEUTROABS 2.9  HGB 11.4*  HCT 34.7*  MCV 88.7  PLT 102   Basic Metabolic Panel: Recent Labs  Lab 02/05/19 0514 02/07/19 0444  NA 140  --   K 3.9  --   CL 108  --   CO2 23  --   GLUCOSE 96  --   BUN 13  --   CREATININE 0.68 0.59  CALCIUM 9.4  --   MG 1.7  --    GFR: Estimated Creatinine Clearance: 89 mL/min (by C-G formula based on SCr of 0.59 mg/dL). Liver Function Tests: Recent Labs  Lab 02/05/19 0514  AST 15  ALT 15  ALKPHOS 36*  BILITOT 0.6  PROT 6.3*  ALBUMIN 3.0*   No results for input(s): LIPASE, AMYLASE in the last 168 hours. No results for input(s): AMMONIA in the last 168 hours. Coagulation Profile: No results for input(s): INR, PROTIME in the last 168 hours. Cardiac Enzymes: No results for input(s): CKTOTAL, CKMB, CKMBINDEX, TROPONINI in the last 168 hours. BNP (last 3 results) No results for input(s): PROBNP in the last 8760 hours. HbA1C: No results for input(s): HGBA1C in the last 72 hours. CBG: No results for input(s): GLUCAP in the last 168 hours. Lipid Profile: No results for input(s): CHOL, HDL, LDLCALC, TRIG, CHOLHDL, LDLDIRECT in the last 72 hours. Thyroid Function Tests: No results for input(s): TSH, T4TOTAL, FREET4, T3FREE, THYROIDAB in the last 72 hours. Anemia Panel: No results for input(s): VITAMINB12, FOLATE, FERRITIN, TIBC, IRON, RETICCTPCT in the last 72 hours. Sepsis Labs: No results for input(s): PROCALCITON, LATICACIDVEN in the last 168 hours.  No results found for this or any  previous visit (from the past 240 hour(s)).       Radiology Studies: No results found.      Scheduled Meds: . amLODipine  5 mg Oral Daily  . enoxaparin (LOVENOX) injection  40 mg Subcutaneous Q24H  . feeding supplement (ENSURE ENLIVE)  237 mL Oral BID BM  . folic acid  1 mg Oral Daily  . ketoconazole   Topical BID  . multivitamin with minerals  1 tablet Oral Daily  . nicotine  21 mg Transdermal Daily  . thiamine  100 mg Oral Daily   Continuous Infusions:   LOS: 58 days    Time spent: 35 minutes    Irine Seal, MD [facility]  If 7PM-7AM, please contact night-coverage [email or web address] 02/09/2019, 11:18 AM

## 2019-02-10 LAB — BASIC METABOLIC PANEL
Anion gap: 10 (ref 5–15)
BUN: 13 mg/dL (ref 8–23)
CO2: 25 mmol/L (ref 22–32)
Calcium: 9.4 mg/dL (ref 8.9–10.3)
Chloride: 104 mmol/L (ref 98–111)
Creatinine, Ser: 0.56 mg/dL (ref 0.44–1.00)
GFR calc Af Amer: >60 mL/min (ref >60)
GFR calc non Af Amer: >60 mL/min (ref >60)
Glucose, Bld: 87 mg/dL (ref 70–99)
Potassium: 3.3 mmol/L — ABNORMAL LOW (ref 3.5–5.1)
Sodium: 139 mmol/L (ref 135–145)

## 2019-02-10 LAB — MAGNESIUM: Magnesium: 1.9 mg/dL (ref 1.7–2.4)

## 2019-02-10 MED ORDER — POTASSIUM CHLORIDE CRYS ER 20 MEQ PO TBCR
40.0000 meq | EXTENDED_RELEASE_TABLET | Freq: Once | ORAL | Status: AC
  Administered 2019-02-10: 40 meq via ORAL
  Filled 2019-02-10: qty 2

## 2019-02-10 MED ORDER — MAGNESIUM SULFATE 2 GM/50ML IV SOLN
2.0000 g | Freq: Once | INTRAVENOUS | Status: DC
  Filled 2019-02-10: qty 50

## 2019-02-10 NOTE — TOC Progression Note (Signed)
Transition of Care [facility]) - Progression Note    Patient Details  Name: Marie Woods MRN: [id number] Date of Birth: November 29, 1955  [facility] CM/SW Contact  Mearl Latin, LCSW Phone Number: 02/10/2019, 5:23 PM  Clinical Narrative:    CSW received contact info for patient's friend, Ms. Glendell Docker [phone]). Patient stated that she could assist patient at DC and requested CSW contact her. CSW spoke with Ms. Glendell Docker. She reports that she works during the day and is unable to take patient in, though she would if she could. CSW faxed referral to [facility] as suggested by [facility] Director.    Expected Discharge Plan: Skilled Nursing Facility Barriers to Discharge: No SNF bed, SNF Pending payor source - LOG  Expected Discharge Plan and Services Expected Discharge Plan: Skilled Nursing Facility In-house Referral: Clinical Social Work Discharge Planning Services: NA Post Acute Care Choice: Skilled Nursing Facility Living arrangements for the past 2 months: Apartment                 DME Arranged: N/A DME Agency: NA                   Social Determinants of Health (SDOH) Interventions    Readmission Risk Interventions No flowsheet data found.

## 2019-02-10 NOTE — Progress Notes (Addendum)
PROGRESS NOTE    CLYDETTE PRIVITERA  LKG:[id number] DOB: 02-12-1955 DOA: 12/13/2018 PCP: Ladell Pier, MD    Brief Narrative:  64 year old female with history of asthma, hypertension, alcohol abuse was brought to the hospital confused and altered.  She was found to have UTI; urine culture showed multiple species.  MRI brain was suggestive of possible Wernicke's encephalopathy.  Treated with thiamine.  PT recommended SNF placement.    Assessment & Plan:   Principal Problem:   AMS (altered mental status) Active Problems:   History of alcohol use   TOBACCO ABUSE   Essential hypertension   Confusion   Acute metabolic encephalopathy   Agitation   Acute lower UTI   Sepsis (HCC)  1 severe sepsis, POA/UTI Urine cultures which were done showed multiple species.  Repeat culture showed less than 10,000 colonies.  Patient initially being treated with IV Rocephin and fluids.  Completed course of antibiotics.  Sepsis resolved.  Chest x-ray negative for any acute infection.  Patient currently hemodynamically stable.  2.  Acute metabolic encephalopathy likely secondary to Wernicke's encephalopathy CT head which was done showed mild atrophic changes without acute intracranial abnormality.  MRI of the brain done showed symmetric diffusion weighted and T2/flair hyperintense signal abnormality within the medial thalami.  Findings favored sequelae of 1 acute encephalopathy.  There was a question of alcohol abuse.  UDS negative.  Status post completion of IV thiamine.  Continue oral thiamine, folic acid, multivitamin.  Will need outpatient follow-up with neurology.  Mental status fluctuating and concerned that patient might have a underlying dementia with some memory issues.  Outpatient follow-up.  3.  Agitation Currently calm.  Patient noted to have required IM Haldol during this hospitalization.  Continue Haldol/Ativan as needed.  Psych evaluated patient and was felt patient did not meet  requirements/criteria for inpatient psychiatric hospitalization.  Psychiatry subsequently signed off.  4.  Hypertension Controlled. Continue Norvasc.  5.  History of alcohol abuse Status post Ativan withdrawal protocol.  No signs of withdrawal currently.  Continue thiamine, folate, multivitamin.  6.  History of tobacco abuse Nicotine patch.   7.  Generalized deconditioning Patient seen by PT/ OT who are recommending SNF.  Patient needs supervision as appears to have short-term memory impairment.  Per Dr. Starla Link he discussed reason for patient's hospitalization on a daily basis but does not remember the next day.  8.  Hypokalemia Replete.    DVT prophylaxis: Lovenox Code Status: Full Family Communication: No family at bedside. Disposition Plan: SNF when bed available.   Consultants:   Dr. Darleene Cleaver 02/04/2019  Procedures:   CT head 12/13/2018  Abdominal films 12/15/2018  Chest x-ray 12/14/2018  MRI brain 12/15/2018  Antimicrobials:   IV Rocephin 12/14/2018>>>:>> 12/17/2018   Subjective: Patient sitting up on the side of the bed.  Patient denies any chest pain.  No shortness of breath.   Objective: Vitals:   02/08/19 2302 02/09/19 1509 02/09/19 2256 02/10/19 0808  BP: 115/80 119/76 125/72 111/68  Pulse: 81 79 81 75  Resp: 18 16 17 17   Temp: 98.1 F (36.7 C) 98.2 F (36.8 C) 98.2 F (36.8 C) 97.6 F (36.4 C)  TempSrc: Oral Oral Oral   SpO2: 95% 100% 99% 100%  Weight:      Height:        Intake/Output Summary (Last 24 hours) at 02/10/2019 1057 Last data filed at 02/10/2019 0715 Gross per 24 hour  Intake 417 ml  Output --  Net 417 ml  Filed Weights   12/15/18 0101  Weight: 98.4 kg    Examination:  General exam: NAD Respiratory system: Lungs clear to auscultation bilaterally.  No wheezes, no crackles, no rhonchi.  Normal respiratory effort. Cardiovascular system: RRR no murmurs rubs or gallops.  No JVD.  No lower extremity edema. Gastrointestinal  system: Soft, nondistended, nontender, positive bowel sounds.  No rebound.  No guarding. Central nervous system: Alert and oriented. No focal neurological deficits. Extremities: Symmetric 5 x 5 power. Skin: No rashes, lesions or ulcers Psychiatry: Judgement and insight appear poor to fair. Mood & affect appropriate.     Data Reviewed: I have personally reviewed following labs and imaging studies  CBC: Recent Labs  Lab 02/05/19 0514  WBC 6.7  NEUTROABS 2.9  HGB 11.4*  HCT 34.7*  MCV 88.7  PLT 284   Basic Metabolic Panel: Recent Labs  Lab 02/05/19 0514 02/07/19 0444 02/10/19 0245  NA 140  --  139  K 3.9  --  3.3*  CL 108  --  104  CO2 23  --  25  GLUCOSE 96  --  87  BUN 13  --  13  CREATININE 0.68 0.59 0.56  CALCIUM 9.4  --  9.4  MG 1.7  --  1.9   GFR: Estimated Creatinine Clearance: 89 mL/min (by C-G formula based on SCr of 0.56 mg/dL). Liver Function Tests: Recent Labs  Lab 02/05/19 0514  AST 15  ALT 15  ALKPHOS 36*  BILITOT 0.6  PROT 6.3*  ALBUMIN 3.0*   No results for input(s): LIPASE, AMYLASE in the last 168 hours. No results for input(s): AMMONIA in the last 168 hours. Coagulation Profile: No results for input(s): INR, PROTIME in the last 168 hours. Cardiac Enzymes: No results for input(s): CKTOTAL, CKMB, CKMBINDEX, TROPONINI in the last 168 hours. BNP (last 3 results) No results for input(s): PROBNP in the last 8760 hours. HbA1C: No results for input(s): HGBA1C in the last 72 hours. CBG: No results for input(s): GLUCAP in the last 168 hours. Lipid Profile: No results for input(s): CHOL, HDL, LDLCALC, TRIG, CHOLHDL, LDLDIRECT in the last 72 hours. Thyroid Function Tests: No results for input(s): TSH, T4TOTAL, FREET4, T3FREE, THYROIDAB in the last 72 hours. Anemia Panel: No results for input(s): VITAMINB12, FOLATE, FERRITIN, TIBC, IRON, RETICCTPCT in the last 72 hours. Sepsis Labs: No results for input(s): PROCALCITON, LATICACIDVEN in the last  168 hours.  No results found for this or any previous visit (from the past 240 hour(s)).       Radiology Studies: No results found.      Scheduled Meds: . amLODipine  5 mg Oral Daily  . enoxaparin (LOVENOX) injection  40 mg Subcutaneous Q24H  . feeding supplement (ENSURE ENLIVE)  237 mL Oral BID BM  . folic acid  1 mg Oral Daily  . ketoconazole   Topical BID  . multivitamin with minerals  1 tablet Oral Daily  . nicotine  21 mg Transdermal Daily  . thiamine  100 mg Oral Daily   Continuous Infusions:   LOS: 59 days    Time spent: 35 minutes    Ramiro Harvest, MD [facility]  If 7PM-7AM, please contact night-coverage [email or web address] 02/10/2019, 10:57 AM

## 2019-02-10 NOTE — Plan of Care (Signed)
  Problem: Activity: Goal: Risk for activity intolerance will decrease Outcome: Progressing   

## 2019-02-11 LAB — BASIC METABOLIC PANEL
Anion gap: 9 (ref 5–15)
BUN: 13 mg/dL (ref 8–23)
CO2: 27 mmol/L (ref 22–32)
Calcium: 9.6 mg/dL (ref 8.9–10.3)
Chloride: 103 mmol/L (ref 98–111)
Creatinine, Ser: 0.61 mg/dL (ref 0.44–1.00)
GFR calc Af Amer: >60 mL/min (ref >60)
GFR calc non Af Amer: >60 mL/min (ref >60)
Glucose, Bld: 84 mg/dL (ref 70–99)
Potassium: 3.9 mmol/L (ref 3.5–5.1)
Sodium: 139 mmol/L (ref 135–145)

## 2019-02-11 NOTE — Progress Notes (Signed)
PROGRESS NOTE    Marie Woods  NWG:[id number] DOB: 12-Sep-1955 DOA: 12/13/2018 PCP: Ladell Pier, MD    Brief Narrative:  64 year old female with history of asthma, hypertension, alcohol abuse was brought to the hospital confused and altered.  She was found to have UTI; urine culture showed multiple species.  MRI brain was suggestive of possible Wernicke's encephalopathy.  Treated with thiamine.  PT recommended SNF placement.    Assessment & Plan:   Principal Problem:   AMS (altered mental status) Active Problems:   History of alcohol use   TOBACCO ABUSE   Essential hypertension   Confusion   Acute metabolic encephalopathy   Agitation   Acute lower UTI   Sepsis (HCC)  1 severe sepsis, POA/UTI Urine cultures which were done showed multiple species.  Repeat culture showed < 10,000 colonies.  Patient initially being treated with IV Rocephin and fluids.  Completed course of antibiotics.  Sepsis resolved.  Chest x-ray negative for any acute infection.  Patient currently hemodynamically stable.  2.  Acute metabolic encephalopathy likely secondary to Wernicke's encephalopathy CT head which was done showed mild atrophic changes without acute intracranial abnormality.  MRI of the brain done showed symmetric diffusion weighted and T2/flair hyperintense signal abnormality within the medial thalami.  Findings favored sequelae of 1 acute encephalopathy.  There was a question of alcohol abuse.  UDS negative.  Status post completion of IV thiamine.  Continue oral thiamine, folic acid, multivitamin.  Will need outpatient follow-up with neurology.  Mental status fluctuating and concerned that patient might have a underlying dementia with some memory issues.  Outpatient follow-up.  3.  Agitation Currently calm.  Patient noted to have required IM Haldol during this hospitalization.  Continue Haldol/Ativan as needed.  Psych evaluated patient and was felt patient did not meet  requirements/criteria for inpatient psychiatric hospitalization.  Psychiatry subsequently signed off.  4.  Hypertension Continue Norvasc.   5.  History of alcohol abuse Status post Ativan withdrawal protocol.  No signs of withdrawal currently.  Continue thiamine, folate, multivitamin.  6.  History of tobacco abuse Continue nicotine patch.   7.  Generalized deconditioning Patient seen by PT/ OT who are recommending SNF.  Patient needs supervision as appears to have short-term memory impairment.  Per Dr. Starla Link he discussed reason for patient's hospitalization on a daily basis but does not remember the next day.  8.  Hypokalemia Repleted.    DVT prophylaxis: Lovenox Code Status: Full Family Communication: No family at bedside. Disposition Plan: SNF when bed available.   Consultants:   Dr. Darleene Cleaver 02/04/2019  Procedures:   CT head 12/13/2018  Abdominal films 12/15/2018  Chest x-ray 12/14/2018  MRI brain 12/15/2018  Antimicrobials:   IV Rocephin 12/14/2018>>>:>> 12/17/2018   Subjective: Patient sitting up about to eat breakfast.  No shortness of breath.  No chest pain.   Objective: Vitals:   02/10/19 0808 02/10/19 1712 02/11/19 0001 02/11/19 0825  BP: 111/68 115/78 125/80 104/79  Pulse: 75 78 63 84  Resp: 17 17 20 14   Temp: 97.6 F (36.4 C) 97.8 F (36.6 C) 97.7 F (36.5 C) 98.3 F (36.8 C)  TempSrc:  Oral Oral Oral  SpO2: 100% 100% 100% 98%  Weight:      Height:       No intake or output data in the 24 hours ending 02/11/19 1101 Filed Weights   12/15/18 0101  Weight: 98.4 kg    Examination:  General exam: NAD Respiratory system: CTA B.  No wheezes, no crackles, no rhonchi.  Normal respiratory effort.  Cardiovascular system: Regular rate rhythm no murmurs rubs or gallops.  No JVD.  No lower extremity edema. Gastrointestinal system: Abdomen soft, nontender, nondistended, positive bowel sounds.  No rebound.  No guarding.  Central nervous system:  Alert and oriented. No focal neurological deficits. Extremities: Symmetric 5 x 5 power. Skin: No rashes, lesions or ulcers Psychiatry: Judgement and insight appear poor to fair. Mood & affect appropriate.     Data Reviewed: I have personally reviewed following labs and imaging studies  CBC: Recent Labs  Lab 02/05/19 0514  WBC 6.7  NEUTROABS 2.9  HGB 11.4*  HCT 34.7*  MCV 88.7  PLT 284   Basic Metabolic Panel: Recent Labs  Lab 02/05/19 0514 02/07/19 0444 02/10/19 0245 02/11/19 0445  NA 140  --  139 139  K 3.9  --  3.3* 3.9  CL 108  --  104 103  CO2 23  --  25 27  GLUCOSE 96  --  87 84  BUN 13  --  13 13  CREATININE 0.68 0.59 0.56 0.61  CALCIUM 9.4  --  9.4 9.6  MG 1.7  --  1.9  --    GFR: Estimated Creatinine Clearance: 89 mL/min (by C-G formula based on SCr of 0.61 mg/dL). Liver Function Tests: Recent Labs  Lab 02/05/19 0514  AST 15  ALT 15  ALKPHOS 36*  BILITOT 0.6  PROT 6.3*  ALBUMIN 3.0*   No results for input(s): LIPASE, AMYLASE in the last 168 hours. No results for input(s): AMMONIA in the last 168 hours. Coagulation Profile: No results for input(s): INR, PROTIME in the last 168 hours. Cardiac Enzymes: No results for input(s): CKTOTAL, CKMB, CKMBINDEX, TROPONINI in the last 168 hours. BNP (last 3 results) No results for input(s): PROBNP in the last 8760 hours. HbA1C: No results for input(s): HGBA1C in the last 72 hours. CBG: No results for input(s): GLUCAP in the last 168 hours. Lipid Profile: No results for input(s): CHOL, HDL, LDLCALC, TRIG, CHOLHDL, LDLDIRECT in the last 72 hours. Thyroid Function Tests: No results for input(s): TSH, T4TOTAL, FREET4, T3FREE, THYROIDAB in the last 72 hours. Anemia Panel: No results for input(s): VITAMINB12, FOLATE, FERRITIN, TIBC, IRON, RETICCTPCT in the last 72 hours. Sepsis Labs: No results for input(s): PROCALCITON, LATICACIDVEN in the last 168 hours.  No results found for this or any previous visit  (from the past 240 hour(s)).       Radiology Studies: No results found.      Scheduled Meds: . amLODipine  5 mg Oral Daily  . enoxaparin (LOVENOX) injection  40 mg Subcutaneous Q24H  . feeding supplement (ENSURE ENLIVE)  237 mL Oral BID BM  . folic acid  1 mg Oral Daily  . ketoconazole   Topical BID  . multivitamin with minerals  1 tablet Oral Daily  . nicotine  21 mg Transdermal Daily  . thiamine  100 mg Oral Daily   Continuous Infusions:   LOS: 60 days    Time spent: 35 minutes    Ramiro Harvest, MD [facility]  If 7PM-7AM, please contact night-coverage [email or web address] 02/11/2019, 11:01 AM

## 2019-02-12 LAB — GLUCOSE, CAPILLARY: Glucose-Capillary: 80 mg/dL (ref 70–99)

## 2019-02-12 NOTE — Progress Notes (Signed)
PROGRESS NOTE    Marie Woods  AUQ:[id number] DOB: [birth year] DOA: 12/13/2018 PCP: Ladell Pier, MD    Brief Narrative:  64 year old female with history of asthma, hypertension, alcohol abuse was brought to the hospital confused and altered.  She was found to have UTI; urine culture showed multiple species.  MRI brain was suggestive of possible Wernicke's encephalopathy.  Treated with thiamine.  PT recommended SNF placement.    Assessment & Plan:   Principal Problem:   AMS (altered mental status) Active Problems:   History of alcohol use   TOBACCO ABUSE   Essential hypertension   Confusion   Acute metabolic encephalopathy   Agitation   Acute lower UTI   Sepsis (HCC)  1 severe sepsis, POA/UTI Urine cultures which were done showed multiple species.  Repeat culture showed < 10,000 colonies.  Patient initially being treated with IV Rocephin and fluids.  Completed course of antibiotics.  Sepsis resolved.  Chest x-ray negative for any acute infection.  Patient currently hemodynamically stable.  2.  Acute metabolic encephalopathy likely secondary to Wernicke's encephalopathy CT head which was done showed mild atrophic changes without acute intracranial abnormality.  MRI of the brain done showed symmetric diffusion weighted and T2/flair hyperintense signal abnormality within the medial thalami.  Findings favored sequelae of 1 acute encephalopathy.  There was a question of alcohol abuse.  UDS negative.  Status post completion of IV thiamine.  Continue oral thiamine, folic acid, multivitamin.  Will need outpatient follow-up with neurology.  Mental status fluctuating and concerned that patient might have a underlying dementia with some memory issues.  Outpatient follow-up.  3.  Agitation Currently calm.  Patient noted to have required IM Haldol during this hospitalization.  Continue Haldol/Ativan as needed.  Psych evaluated patient and was felt patient did not meet  requirements/criteria for inpatient psychiatric hospitalization.  Psychiatry subsequently signed off.  4.  Hypertension Norvasc.   5.  History of alcohol abuse Status post Ativan withdrawal protocol.  No signs of withdrawal currently.  Continue thiamine, folate, multivitamin.  6.  History of tobacco abuse Nicotine patch.   7.  Generalized deconditioning Patient seen by PT/ OT who are recommending SNF.  Patient needs supervision as appears to have short-term memory impairment.  Per Dr. Starla Link he discussed reason for patient's hospitalization on a daily basis but does not remember the next day.  8.  Hypokalemia Repleted.     DVT prophylaxis: Lovenox Code Status: Full Family Communication: No family at bedside. Disposition Plan: SNF when bed available.   Consultants:   Dr. Darleene Cleaver 02/04/2019  Procedures:   CT head 12/13/2018  Abdominal films 12/15/2018  Chest x-ray 12/14/2018  MRI brain 12/15/2018  Antimicrobials:   IV Rocephin 12/14/2018>>>:>> 12/17/2018   Subjective: Patient sitting up in bed waiting on her lunch.  Denies any chest pain or shortness of breath.  Objective: Vitals:   02/11/19 0001 02/11/19 0825 02/11/19 2316 02/12/19 0848  BP: 125/80 104/79 120/87 115/88  Pulse: 63 84 70 85  Resp: 20 14 16 18   Temp: 97.7 F (36.5 C) 98.3 F (36.8 C) 97.7 F (36.5 C) 98.2 F (36.8 C)  TempSrc: Oral Oral Oral   SpO2: 100% 98% 99% 97%  Weight:      Height:       No intake or output data in the 24 hours ending 02/12/19 1236 Filed Weights   12/15/18 0101  Weight: 98.4 kg    Examination:  General exam: NAD Respiratory system: Lungs clear to  auscultation bilaterally.  No wheezes, no crackles, no rhonchi.  Normal respiratory effort. Cardiovascular system: RRR no murmurs rubs or gallops.  No JVD.  No lower extremity edema.  Gastrointestinal system: Abdomen nontender, nondistended, soft, positive bowel sounds.  No rebound.  No guarding.  Central nervous  system: Alert and oriented. No focal neurological deficits. Extremities: Symmetric 5 x 5 power. Skin: No rashes, lesions or ulcers Psychiatry: Judgement and insight appear poor to fair. Mood & affect appropriate.     Data Reviewed: I have personally reviewed following labs and imaging studies  CBC: No results for input(s): WBC, NEUTROABS, HGB, HCT, MCV, PLT in the last 168 hours. Basic Metabolic Panel: Recent Labs  Lab 02/07/19 0444 02/10/19 0245 02/11/19 0445  NA  --  139 139  K  --  3.3* 3.9  CL  --  104 103  CO2  --  25 27  GLUCOSE  --  87 84  BUN  --  13 13  CREATININE 0.59 0.56 0.61  CALCIUM  --  9.4 9.6  MG  --  1.9  --    GFR: Estimated Creatinine Clearance: 89 mL/min (by C-G formula based on SCr of 0.61 mg/dL). Liver Function Tests: No results for input(s): AST, ALT, ALKPHOS, BILITOT, PROT, ALBUMIN in the last 168 hours. No results for input(s): LIPASE, AMYLASE in the last 168 hours. No results for input(s): AMMONIA in the last 168 hours. Coagulation Profile: No results for input(s): INR, PROTIME in the last 168 hours. Cardiac Enzymes: No results for input(s): CKTOTAL, CKMB, CKMBINDEX, TROPONINI in the last 168 hours. BNP (last 3 results) No results for input(s): PROBNP in the last 8760 hours. HbA1C: No results for input(s): HGBA1C in the last 72 hours. CBG: Recent Labs  Lab 02/12/19 0805  GLUCAP 80   Lipid Profile: No results for input(s): CHOL, HDL, LDLCALC, TRIG, CHOLHDL, LDLDIRECT in the last 72 hours. Thyroid Function Tests: No results for input(s): TSH, T4TOTAL, FREET4, T3FREE, THYROIDAB in the last 72 hours. Anemia Panel: No results for input(s): VITAMINB12, FOLATE, FERRITIN, TIBC, IRON, RETICCTPCT in the last 72 hours. Sepsis Labs: No results for input(s): PROCALCITON, LATICACIDVEN in the last 168 hours.  No results found for this or any previous visit (from the past 240 hour(s)).       Radiology Studies: No results found.       Scheduled Meds: . amLODipine  5 mg Oral Daily  . enoxaparin (LOVENOX) injection  40 mg Subcutaneous Q24H  . feeding supplement (ENSURE ENLIVE)  237 mL Oral BID BM  . folic acid  1 mg Oral Daily  . ketoconazole   Topical BID  . multivitamin with minerals  1 tablet Oral Daily  . nicotine  21 mg Transdermal Daily  . thiamine  100 mg Oral Daily   Continuous Infusions:   LOS: 61 days    Time spent: 35 minutes    Ramiro Harvest, MD [facility]  If 7PM-7AM, please contact night-coverage [email or web address] 02/12/2019, 12:36 PM
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
## 2019-02-13 NOTE — Progress Notes (Signed)
PROGRESS NOTE    Marie Woods  RWE:[id number] DOB: 1955/11/22 DOA: 12/13/2018 PCP: Ladell Pier, MD    Brief Narrative:  64 year old female with history of asthma, hypertension, alcohol abuse was brought to the hospital confused and altered.  She was found to have UTI; urine culture showed multiple species.  MRI brain was suggestive of possible Wernicke's encephalopathy.  Treated with thiamine.  PT recommended SNF placement.    Assessment & Plan:   Principal Problem:   AMS (altered mental status) Active Problems:   History of alcohol use   TOBACCO ABUSE   Essential hypertension   Confusion   Acute metabolic encephalopathy   Agitation   Acute lower UTI   Sepsis (HCC)  1 severe sepsis, POA/UTI Urine cultures which were done showed multiple species.  Repeat culture showed < 10,000 colonies.  Patient initially being treated with IV Rocephin and fluids.  Completed course of antibiotics.  Sepsis resolved.  Chest x-ray negative for any acute infection.  Patient currently hemodynamically stable.  2.  Acute metabolic encephalopathy likely secondary to Wernicke's encephalopathy CT head which was done showed mild atrophic changes without acute intracranial abnormality.  MRI of the brain done showed symmetric diffusion weighted and T2/flair hyperintense signal abnormality within the medial thalami.  Findings favored sequelae of 1 acute encephalopathy.  There was a question of alcohol abuse.  UDS negative.  Status post completion of IV thiamine.  Continue oral thiamine, folic acid, multivitamin.  Will need outpatient follow-up with neurology.  Mental status fluctuating and concerned that patient might have a underlying dementia with some memory issues.  Outpatient follow-up.  3.  Agitation Currently calm.  Patient noted to have required IM Haldol during this hospitalization.  Continue Haldol/Ativan as needed.  Psych evaluated patient and was felt patient did not meet  requirements/criteria for inpatient psychiatric hospitalization.  Psychiatry subsequently signed off.  4.  Hypertension Controlled.  Continue Norvasc.   5.  History of alcohol abuse No signs of withdrawal.  Status post Ativan withdrawal protocol.  Continue thiamine, folate, multivitamin.   6.  History of tobacco abuse Continue nicotine patch.   7.  Generalized deconditioning Patient seen by PT/ OT who are recommending SNF.  Patient needs supervision as appears to have short-term memory impairment.  Per Dr. Starla Link he discussed reason for patient's hospitalization on a daily basis but does not remember the next day.  8.  Hypokalemia Repleted.      DVT prophylaxis: Lovenox Code Status: Full Family Communication: No family at bedside. Disposition Plan: SNF when bed available.   Consultants:   Dr. Darleene Cleaver 02/04/2019  Procedures:   CT head 12/13/2018  Abdominal films 12/15/2018  Chest x-ray 12/14/2018  MRI brain 12/15/2018  Antimicrobials:   IV Rocephin 12/14/2018>>>:>> 12/17/2018   Subjective: Patient sitting up in chair.  No shortness of breath.  No chest pain.   Objective: Vitals:   02/12/19 0848 02/12/19 1757 02/12/19 2238 02/13/19 0848  BP: 115/88 128/82 116/82 128/85  Pulse: 85 87 78 81  Resp: 18 19 18 16   Temp: 98.2 F (36.8 C) 97.8 F (36.6 C) 98.2 F (36.8 C) 97.9 F (36.6 C)  TempSrc:   Oral Oral  SpO2: 97% 98% 100% 98%  Weight:      Height:        Intake/Output Summary (Last 24 hours) at 02/13/2019 0944 Last data filed at 02/13/2019 0715 Gross per 24 hour  Intake 240 ml  Output -  Net 240 ml   Danley Danker  Weights   12/15/18 0101  Weight: 98.4 kg    Examination:  General exam: No acute distress.  Respiratory system: CTAB.  Normal respiratory effort. Cardiovascular system: Regular rate rhythm no murmurs rubs or gallops.  No JVD.  No lower extremity edema.  Gastrointestinal system: Abdomen soft, nontender, nondistended, positive bowel sounds.   No rebound.  No guarding.  Central nervous system: Alert and oriented. No focal neurological deficits. Extremities: Symmetric 5 x 5 power. Skin: No rashes, lesions or ulcers Psychiatry: Judgement and insight appear poor to fair. Mood & affect appropriate.     Data Reviewed: I have personally reviewed following labs and imaging studies  CBC: No results for input(s): WBC, NEUTROABS, HGB, HCT, MCV, PLT in the last 168 hours. Basic Metabolic Panel: Recent Labs  Lab 02/07/19 0444 02/10/19 0245 02/11/19 0445  NA  --  139 139  K  --  3.3* 3.9  CL  --  104 103  CO2  --  25 27  GLUCOSE  --  87 84  BUN  --  13 13  CREATININE 0.59 0.56 0.61  CALCIUM  --  9.4 9.6  MG  --  1.9  --    GFR: Estimated Creatinine Clearance: 89 mL/min (by C-G formula based on SCr of 0.61 mg/dL). Liver Function Tests: No results for input(s): AST, ALT, ALKPHOS, BILITOT, PROT, ALBUMIN in the last 168 hours. No results for input(s): LIPASE, AMYLASE in the last 168 hours. No results for input(s): AMMONIA in the last 168 hours. Coagulation Profile: No results for input(s): INR, PROTIME in the last 168 hours. Cardiac Enzymes: No results for input(s): CKTOTAL, CKMB, CKMBINDEX, TROPONINI in the last 168 hours. BNP (last 3 results) No results for input(s): PROBNP in the last 8760 hours. HbA1C: No results for input(s): HGBA1C in the last 72 hours. CBG: Recent Labs  Lab 02/12/19 0805  GLUCAP 80   Lipid Profile: No results for input(s): CHOL, HDL, LDLCALC, TRIG, CHOLHDL, LDLDIRECT in the last 72 hours. Thyroid Function Tests: No results for input(s): TSH, T4TOTAL, FREET4, T3FREE, THYROIDAB in the last 72 hours. Anemia Panel: No results for input(s): VITAMINB12, FOLATE, FERRITIN, TIBC, IRON, RETICCTPCT in the last 72 hours. Sepsis Labs: No results for input(s): PROCALCITON, LATICACIDVEN in the last 168 hours.  No results found for this or any previous visit (from the past 240 hour(s)).       Radiology  Studies: No results found.      Scheduled Meds: . amLODipine  5 mg Oral Daily  . enoxaparin (LOVENOX) injection  40 mg Subcutaneous Q24H  . feeding supplement (ENSURE ENLIVE)  237 mL Oral BID BM  . folic acid  1 mg Oral Daily  . ketoconazole   Topical BID  . multivitamin with minerals  1 tablet Oral Daily  . nicotine  21 mg Transdermal Daily  . thiamine  100 mg Oral Daily   Continuous Infusions:   LOS: 62 days    Time spent: 35 minutes    Ramiro Harvest, MD [facility]  If 7PM-7AM, please contact night-coverage [email or web address] 02/13/2019, 9:44 AM

## 2019-02-14 NOTE — Progress Notes (Addendum)
Occupational Therapy Treatment Patient Details Name: Marie Woods MRN: [id number] DOB: 1955-06-09 Today's Date: 02/14/2019    History of present illness Patient is a 64 year old female with medical history significant of asthma, hypertension, alcohol abuse who was brought in by EMS after neighbors called in stating that patient is confused and altered. Patient admitted Severe Sepsis with Acute metabolic encephalopathy secondary to UTI.    OT comments  Pt continues to present with decreased cognition including poor ST memory, attention, and problem solving. Pt performing three part trail making task with Max cues for recall of locations and Mod cues for problem solving. Pt verbalizing awareness that she had deficits but difficulty identifying errors within a task. Continue to recommend dc to SNF and decrease frequency to x1 weekly as pt performing ADLs at supervision level.   Follow Up Recommendations  SNF;Supervision/Assistance - 24 hour    Equipment Recommendations  3 in 1 bedside commode    Recommendations for Other Services      Precautions / Restrictions Precautions Precautions: Fall Restrictions Weight Bearing Restrictions: No       Mobility Bed Mobility               General bed mobility comments: EOB  Transfers Overall transfer level: Needs assistance Equipment used: Rolling walker (2 wheeled) Transfers: Sit to/from Stand Sit to Stand: Supervision         General transfer comment: Supervision for safety    Balance Overall balance assessment: Needs assistance Sitting-balance support: No upper extremity supported;Feet supported Sitting balance-Leahy Scale: Normal Sitting balance - Comments: static sitting balance EOB without LOB   Standing balance support: Single extremity supported;During functional activity Standing balance-Leahy Scale: Fair Standing balance comment: close supervision                           ADL either performed or  assessed with clinical judgement   ADL Overall ADL's : Modified independent;Needs assistance/impaired                       Lower Body Dressing Details (indicate cue type and reason): Pt adjusting her socks without difficulty Toilet Transfer: Supervision/safety;Ambulation;RW(simulated in room) Toilet Transfer Details (indicate cue type and reason): Supervision for safety         Functional mobility during ADLs: Supervision/safety;Rolling walker General ADL Comments: Focused session on cognition through trail making task and meal prep task (makign coffee).     Vision   Vision Assessment?: No apparent visual deficits   Perception     Praxis      Cognition Arousal/Alertness: Awake/alert Behavior During Therapy: WFL for tasks assessed/performed Overall Cognitive Status: History of cognitive impairments - at baseline Area of Impairment: Attention;Memory;Following commands;Safety/judgement;Awareness;Problem solving                   Current Attention Level: Selective Memory: Decreased short-term memory Following Commands: Follows one step commands consistently;Follows one step commands with increased time;Follows multi-step commands inconsistently Safety/Judgement: Decreased awareness of safety;Decreased awareness of deficits Awareness: Emergent Problem Solving: Requires verbal cues;Difficulty sequencing General Comments: Challenging ST memory, problem solving, and attention with three part trail making task. Pt requiring Max cues to recall the three locations and Mod cuesfor problem solving. Pt reporting she knows she is having difficulty with memory and very appreciative of therapy. Pt performing meal prep task by making coffee. Pt able to verablize what she needed for coffee, but pt getting lost  upon returning to her room from nsg station and requiring Mod cues.         Exercises     Shoulder Instructions       General Comments VSS    Pertinent Vitals/  Pain       Pain Assessment: Faces Faces Pain Scale: Hurts a little bit Pain Location: headache Pain Descriptors / Indicators: Headache Pain Intervention(s): Monitored during session;Patient requesting pain meds-RN notified  Home Living                                          Prior Functioning/Environment              Frequency  Min 1X/week        Progress Toward Goals  OT Goals(current goals can now be found in the care plan section)  Progress towards OT goals: Progressing toward goals  Acute Rehab OT Goals Patient Stated Goal: return to PLOF OT Goal Formulation: With patient Time For Goal Achievement: 02/03/19 Potential to Achieve Goals: Good ADL Goals Pt Will Perform Lower Body Dressing: with modified independence;sit to/from stand Pt Will Transfer to Toilet: with modified independence;ambulating;regular height toilet Pt Will Perform Toileting - Clothing Manipulation and hygiene: with modified independence;sit to/from stand Additional ADL Goal #1: Patient will complete medication management tasks with zero errors in 2/3 trials.  Plan Frequency remains appropriate;Discharge plan remains appropriate    Co-evaluation                 AM-PAC OT "6 Clicks" Daily Activity     Outcome Measure   Help from another person eating meals?: None Help from another person taking care of personal grooming?: A Little Help from another person toileting, which includes using toliet, bedpan, or urinal?: A Little Help from another person bathing (including washing, rinsing, drying)?: A Little Help from another person to put on and taking off regular upper body clothing?: None Help from another person to put on and taking off regular lower body clothing?: A Little 6 Click Score: 20    End of Session Equipment Utilized During Treatment: Rolling walker  OT Visit Diagnosis: Unsteadiness on feet (R26.81);Other abnormalities of gait and mobility (R26.89);Muscle  weakness (generalized) (M62.81);Other symptoms and signs involving cognitive function   Activity Tolerance Patient tolerated treatment well   Patient Left in chair;with call bell/phone within reach;with chair alarm set   Nurse Communication Mobility status        Time: 4403-4742 OT Time Calculation (min): 15 min  Charges: OT General Charges $OT Visit: 1 Visit OT Treatments $Cognitive Funtion inital: Initial 15 mins  Yusef Lamp MSOT, OTR/L Acute Rehab Pager: [phone] Office: [place] 02/14/2019, 5:25 PM

## 2019-02-14 NOTE — TOC Progression Note (Signed)
Transition of Care [facility]) - Progression Note    Patient Details  Name: DANEE SOLLER MRN: [id number] Date of Birth: 1955-02-15  Transition of [facility] CM/SW Contact  Mearl Latin, [place] Phone Number: 02/14/2019, 12:12 PM  Clinical Narrative:    [facility] has declined patient. Continuing bed search.    Expected Discharge Plan: Skilled Nursing Facility Barriers to Discharge: No SNF bed, SNF Pending payor source - LOG  Expected Discharge Plan and Services Expected Discharge Plan: Skilled Nursing Facility In-house Referral: Clinical Social Work Discharge Planning Services: NA Post Acute Care Choice: Skilled Nursing Facility Living arrangements for the past 2 months: Apartment                 DME Arranged: N/A DME Agency: NA                   Social Determinants of Health (SDOH) Interventions    Readmission Risk Interventions No flowsheet data found.

## 2019-02-14 NOTE — Progress Notes (Signed)
PROGRESS NOTE    Marie Woods  IWP:[id number] DOB: May 26, 1955 DOA: 12/13/2018 PCP: Ladell Pier, MD    Brief Narrative:  64 year old female with history of asthma, hypertension, alcohol abuse was brought to the hospital confused and altered.  She was found to have UTI; urine culture showed multiple species.  MRI brain was suggestive of possible Wernicke's encephalopathy.  Treated with thiamine.  PT recommended SNF placement.    Assessment & Plan:   Principal Problem:   AMS (altered mental status) Active Problems:   History of alcohol use   TOBACCO ABUSE   Essential hypertension   Confusion   Acute metabolic encephalopathy   Agitation   Acute lower UTI   Sepsis (HCC)  1 severe sepsis, POA/UTI Urine cultures which were done showed multiple species.  Repeat culture showed < 10,000 colonies.  Patient initially being treated with IV Rocephin and fluids.  Completed course of antibiotics.  Sepsis resolved.  Chest x-ray negative for any acute infection.  Patient currently hemodynamically stable.  2.  Acute metabolic encephalopathy likely secondary to Wernicke's encephalopathy CT head which was done showed mild atrophic changes without acute intracranial abnormality.  MRI of the brain done showed symmetric diffusion weighted and T2/flair hyperintense signal abnormality within the medial thalami.  Findings favored sequelae of 1 acute encephalopathy.  There was a question of alcohol abuse.  UDS negative.  Status post completion of IV thiamine.  Continue oral thiamine, folic acid, multivitamin.  Will need outpatient follow-up with neurology.  Mental status fluctuating and concerned that patient might have a underlying dementia with some memory issues.  Outpatient follow-up.  3.  Agitation Currently calm.  Patient noted to have required IM Haldol during this hospitalization.  Continue Haldol/Ativan as needed.  Psych evaluated patient and was felt patient did not meet  requirements/criteria for inpatient psychiatric hospitalization.  Psychiatry subsequently signed off.  4.  Hypertension Norvasc.   5.  History of alcohol abuse No signs of withdrawal.  Status post Ativan withdrawal protocol.  Continue thiamine, folate, multivitamin.   6.  History of tobacco abuse Nicotine patch.   7.  Generalized deconditioning Patient seen by PT/ OT who are recommending SNF.  Patient needs supervision as appears to have short-term memory impairment.  Per Dr. Starla Link he discussed reason for patient's hospitalization on a daily basis but does not remember the next day.  8.  Hypokalemia Repleted.  Labs in the morning.    DVT prophylaxis: Lovenox Code Status: Full Family Communication: No family at bedside. Disposition Plan: SNF when bed available.   Consultants:   Dr. Darleene Cleaver 02/04/2019  Procedures:   CT head 12/13/2018  Abdominal films 12/15/2018  Chest x-ray 12/14/2018  MRI brain 12/15/2018  Antimicrobials:   IV Rocephin 12/14/2018>>>:>> 12/17/2018   Subjective: Patient sleeping but easily arousable.  Events overnight noted.  Denies any chest pain no shortness of breath.    Objective: Vitals:   02/13/19 0848 02/13/19 1637 02/13/19 2309 02/14/19 0847  BP: 128/85 (!) 117/91 119/83 110/76  Pulse: 81 88 76 80  Resp: 16 16  16   Temp: 97.9 F (36.6 C) 98.4 F (36.9 C) 98.2 F (36.8 C) 98.3 F (36.8 C)  TempSrc: Oral Oral Oral Oral  SpO2: 98% 99% 99% 96%  Weight:      Height:       No intake or output data in the 24 hours ending 02/14/19 0920 Filed Weights   12/15/18 0101  Weight: 98.4 kg    Examination:  General  exam: NAD Respiratory system: Lungs clear to auscultation bilaterally.  No wheezes, no crackles, no rhonchi.  Normal respiratory effort.  Cardiovascular system: RRR no murmurs rubs or gallops.  No JVD.  No lower extremity edema.   Gastrointestinal system: Abdomen is nontender, nondistended, soft, positive bowel sounds.  No  rebound.  No guarding. Central nervous system: Alert and oriented. No focal neurological deficits. Extremities: Symmetric 5 x 5 power. Skin: No rashes, lesions or ulcers Psychiatry: Judgement and insight appear poor to fair. Mood & affect appropriate.     Data Reviewed: I have personally reviewed following labs and imaging studies  CBC: No results for input(s): WBC, NEUTROABS, HGB, HCT, MCV, PLT in the last 168 hours. Basic Metabolic Panel: Recent Labs  Lab 02/10/19 0245 02/11/19 0445  NA 139 139  K 3.3* 3.9  CL 104 103  CO2 25 27  GLUCOSE 87 84  BUN 13 13  CREATININE 0.56 0.61  CALCIUM 9.4 9.6  MG 1.9  --    GFR: Estimated Creatinine Clearance: 89 mL/min (by C-G formula based on SCr of 0.61 mg/dL). Liver Function Tests: No results for input(s): AST, ALT, ALKPHOS, BILITOT, PROT, ALBUMIN in the last 168 hours. No results for input(s): LIPASE, AMYLASE in the last 168 hours. No results for input(s): AMMONIA in the last 168 hours. Coagulation Profile: No results for input(s): INR, PROTIME in the last 168 hours. Cardiac Enzymes: No results for input(s): CKTOTAL, CKMB, CKMBINDEX, TROPONINI in the last 168 hours. BNP (last 3 results) No results for input(s): PROBNP in the last 8760 hours. HbA1C: No results for input(s): HGBA1C in the last 72 hours. CBG: Recent Labs  Lab 02/12/19 0805  GLUCAP 80   Lipid Profile: No results for input(s): CHOL, HDL, LDLCALC, TRIG, CHOLHDL, LDLDIRECT in the last 72 hours. Thyroid Function Tests: No results for input(s): TSH, T4TOTAL, FREET4, T3FREE, THYROIDAB in the last 72 hours. Anemia Panel: No results for input(s): VITAMINB12, FOLATE, FERRITIN, TIBC, IRON, RETICCTPCT in the last 72 hours. Sepsis Labs: No results for input(s): PROCALCITON, LATICACIDVEN in the last 168 hours.  No results found for this or any previous visit (from the past 240 hour(s)).       Radiology Studies: No results found.      Scheduled Meds: .  amLODipine  5 mg Oral Daily  . enoxaparin (LOVENOX) injection  40 mg Subcutaneous Q24H  . feeding supplement (ENSURE ENLIVE)  237 mL Oral BID BM  . folic acid  1 mg Oral Daily  . ketoconazole   Topical BID  . multivitamin with minerals  1 tablet Oral Daily  . nicotine  21 mg Transdermal Daily  . thiamine  100 mg Oral Daily   Continuous Infusions:   LOS: 63 days    Time spent: 35 minutes    Ramiro Harvest, MD [facility]  If 7PM-7AM, please contact night-coverage [email or web address] 02/14/2019, 9:20 AM

## 2019-02-15 LAB — BASIC METABOLIC PANEL
Anion gap: 9 (ref 5–15)
BUN: 15 mg/dL (ref 8–23)
CO2: 27 mmol/L (ref 22–32)
Calcium: 9.4 mg/dL (ref 8.9–10.3)
Chloride: 101 mmol/L (ref 98–111)
Creatinine, Ser: 0.66 mg/dL (ref 0.44–1.00)
GFR calc Af Amer: >60 mL/min (ref >60)
GFR calc non Af Amer: >60 mL/min (ref >60)
Glucose, Bld: 89 mg/dL (ref 70–99)
Potassium: 3.7 mmol/L (ref 3.5–5.1)
Sodium: 137 mmol/L (ref 135–145)

## 2019-02-15 LAB — MAGNESIUM: Magnesium: 2 mg/dL (ref 1.7–2.4)

## 2019-02-15 MED ORDER — NICOTINE 14 MG/24HR TD PT24
14.0000 mg | MEDICATED_PATCH | Freq: Every day | TRANSDERMAL | Status: DC
  Administered 2019-02-17 – 2019-05-18 (×89): 14 mg via TRANSDERMAL
  Filled 2019-02-15 (×93): qty 1

## 2019-02-15 NOTE — Progress Notes (Signed)
Chart reviewed. The patient's clinical presentation with confusion and AMS in the setting of EtOH abuse was most consistent with acute thiamine deficiency. Additionally, MRI showed symmetric signal abnormalities most c/w acute thiamine deficiency, with medial thalamic injury bilaterally (images personally reviewed). Thiamine treatment was unsuccessful, which often is the case if damage is already seen on MRI.   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   Per OT note, the patient continues to exhibit decreased cognition including poor short term memory (consistent with anterograde memory loss), poor attention, and impaired problem solving. OT has recommended a SNF environment for the patient due to her cognitive impairments. 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   Electronically signed: Dr. Caryl Pina

## 2019-02-15 NOTE — Progress Notes (Addendum)
PROGRESS NOTE    Marie Woods  QPY:[id number] DOB: Jul 02, 1955 DOA: 12/13/2018 PCP: Ladell Pier, MD    Brief Narrative:   As per HPI,   64 year old female with past medical history of asthma, hypertension, alcohol abuse was brought to the hospital with confusion and altered mental status.  She was found to have UTI; urine culture showed multiple species.  MRI brain was suggestive of possible Wernicke's encephalopathy.  Treated with thiamine.  PT recommended SNF placement.  Assessment & Plan:   Principal Problem:   AMS (altered mental status) Active Problems:   History of alcohol use   TOBACCO ABUSE   Essential hypertension   Confusion   Acute metabolic encephalopathy   Agitation   Acute lower UTI   Sepsis (HCC)  Severe sepsis, present on admission secondary to UTI. Resolved.  Urine cultures which were done showed multiple species.  Repeat culture showed < 10,000 colonies.  Patient was treated with IV Rocephin and fluids- completed course of antibiotic.  Sepsis has resolved.  Chest x-ray was negative.  Acute metabolic encephalopathy likely secondary to Wernicke's encephalopathy Improved.  CT head which was done showed mild atrophic changes without acute intracranial abnormality.  MRI of the brain done showed symmetric diffusion weighted and T2/flair hyperintense signal abnormality within the medial thalami.  Findings favored sequelae of Wernicke encephalopathy.   UDS negative.  Completed IV thiamine.  Continue oral thiamine, folic acid, multivitamin.  Will need outpatient follow-up with neurology after discharge..  Patient does have mild memory issues likely has underlying dementia.  Will need outpatient follow-up.  Will consult neurology.  Agitation.  Has resolved.  Patient was seen by psychiatry as well.  Essential hypertension Continue Norvasc.  Blood pressure seems to be stable at this time.  History of alcohol abuse No signs of withdrawal.  Status post Ativan  withdrawal protocol.  Continue thiamine, folate, multivitamin.   history of tobacco abuse On 21 mg nicotine patch.  We will decrease the dose to 14 mg  Generalized deconditioning and debility Patient has been seen by physical therapy and Occupational Therapy who recommended skilled nursing facility placement on discharge.  Patient does have memory issues requiring supervision as well.    Hypokalemia Replenished.  Potassium today 3.7.  Magnesium 2.0.  DVT prophylaxis: Lovenox  Code Status: Full code.    Family Communication: Spoke with the patient at bedside.   Disposition Plan: Plan for skilled nursing facility placement when available.  Medicaid pending.  Patient was last seen by occupational Therapy on 02/14/2019 recommending skilled nursing facility.  Will get neurology evaluation.  Addendum:  02/15/2019 5:45 PM   Spoke with Dr. Cheral Marker neurology at length.  Appreciate neurological input.  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  Consultants:   Dr. Darleene Cleaver 02/04/2019  Neurology Dr Cheral Marker 02/15/19  Procedures:   CT head 12/13/2018  Abdominal films 12/15/2018  Chest x-ray 12/14/2018  MRI brain 12/15/2018  Antimicrobials:   IV Rocephin 12/14/2018> 12/17/2018  Subjective: Today, states that she feels okay.  Denies any nausea, vomiting or abdominal pain.  Denies shortness of breath, cough or fever.  Objective: Vitals:   02/14/19 0847 02/14/19 1640 02/14/19 2236 02/15/19 0900  BP: 110/76 105/75 113/86 104/68  Pulse: 80 78 71 77  Resp: 16 17  16   Temp: 98.3 F (36.8 C) 98.2 F (36.8 C) 97.9 F (36.6 C) 98 F (36.7 C)  TempSrc: Oral Oral Oral Oral  SpO2: 96% 98% 97% 99%  Weight:  Height:        Intake/Output Summary (Last 24 hours) at 02/15/2019 1041 Last data filed at 02/15/2019 1031 Gross per 24 hour  Intake 358 ml  Output --  Net 358 ml   Filed Weights   12/15/18 0101  Weight: 98.4 kg   Body mass index is 33.99 kg/m.    Examination: General:  obese built, not in obvious distress, alert awake and communicative. HENT: Normocephalic, pupils equally reacting to light and accommodation.  No scleral pallor or icterus noted. Oral mucosa is moist.  Chest:  Clear breath sounds.  Diminished breath sounds bilaterally. No crackles or wheezes.  CVS: S1 &S2 heard. No murmur.  Regular rate and rhythm. Abdomen: Soft, nontender, nondistended.  Bowel sounds are heard.  Liver is not palpable, no abdominal mass palpated Extremities: No cyanosis, clubbing or edema.  Peripheral pulses are palpable. Psych: Alert, awake and oriented to place and person but disoriented to year. normal mood CNS:  No cranial nerve deficits.  Power equal in all extremities.  No sensory deficits noted.   Skin: Warm and dry.  No rashes noted.   Data Reviewed: I have personally reviewed following labs and imaging studies  CBC: No results for input(s): WBC, NEUTROABS, HGB, HCT, MCV, PLT in the last 168 hours. Basic Metabolic Panel: Recent Labs  Lab 02/10/19 0245 02/11/19 0445 02/15/19 0414  NA 139 139 137  K 3.3* 3.9 3.7  CL 104 103 101  CO2 25 27 27   GLUCOSE 87 84 89  BUN 13 13 15   CREATININE 0.56 0.61 0.66  CALCIUM 9.4 9.6 9.4  MG 1.9  --  2.0   GFR: Estimated Creatinine Clearance: 89 mL/min (by C-G formula based on SCr of 0.66 mg/dL). Liver Function Tests: No results for input(s): AST, ALT, ALKPHOS, BILITOT, PROT, ALBUMIN in the last 168 hours. No results for input(s): LIPASE, AMYLASE in the last 168 hours. No results for input(s): AMMONIA in the last 168 hours. Coagulation Profile: No results for input(s): INR, PROTIME in the last 168 hours. Cardiac Enzymes: No results for input(s): CKTOTAL, CKMB, CKMBINDEX, TROPONINI in the last 168 hours. BNP (last 3 results) No results for input(s): PROBNP in the last 8760 hours. HbA1C: No results for input(s): HGBA1C in the last 72 hours. CBG: Recent Labs  Lab 02/12/19 0805  GLUCAP 80    Lipid Profile: No results for input(s): CHOL, HDL, LDLCALC, TRIG, CHOLHDL, LDLDIRECT in the last 72 hours. Thyroid Function Tests: No results for input(s): TSH, T4TOTAL, FREET4, T3FREE, THYROIDAB in the last 72 hours. Anemia Panel: No results for input(s): VITAMINB12, FOLATE, FERRITIN, TIBC, IRON, RETICCTPCT in the last 72 hours. Sepsis Labs: No results for input(s): PROCALCITON, LATICACIDVEN in the last 168 hours.  No results found for this or any previous visit (from the past 240 hour(s)).     Radiology Studies: No results found.   Scheduled Meds: . amLODipine  5 mg Oral Daily  . enoxaparin (LOVENOX) injection  40 mg Subcutaneous Q24H  . feeding supplement (ENSURE ENLIVE)  237 mL Oral BID BM  . folic acid  1 mg Oral Daily  . ketoconazole   Topical BID  . multivitamin with minerals  1 tablet Oral Daily  . nicotine  21 mg Transdermal Daily  . thiamine  100 mg Oral Daily   Continuous Infusions:   LOS: 64 days    , MD [facility] 02/15/2019, 10:41 AM

## 2019-02-16 NOTE — Progress Notes (Signed)
PROGRESS NOTE    Marie Woods  ZOX:[id number] DOB: [birth year] DOA: 12/13/2018 PCP: Marcine Matar, MD    Brief Narrative:   As per HPI,   64 year old female with past medical history of asthma, hypertension, alcohol abuse was brought to the hospital with confusion and altered mental status.  She was found to have UTI; urine culture showed multiple species.  MRI brain was suggestive of possible Wernicke's encephalopathy.  Treated with thiamine.  PT subsequently evaluated the patient and recommended SNF placement.  Assessment & Plan:   Principal Problem:   AMS (altered mental status) Active Problems:   History of alcohol use   TOBACCO ABUSE   Essential hypertension   Confusion   Acute metabolic encephalopathy   Agitation   Acute lower UTI   Sepsis (HCC)  Severe sepsis, present on admission secondary to UTI. Resolved.  Urine cultures which were done showed multiple species.  Repeat culture showed < 10,000 colonies.  Patient was treated with IV Rocephin and has completed course of antibiotic.  Sepsis has resolved.  Chest x-ray was negative.  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 Improved.  CT head which was done showed mild atrophic changes without acute intracranial abnormality.  MRI of the brain done showed symmetric diffusion weighted and T2/flair hyperintense signal abnormality within the medial thalami.  Findings favored sequelae of Wernicke encephalopathy.   UDS negative.  Completed IV thiamine.  Continue oral thiamine, folic acid, multivitamin.  Will need outpatient follow-up with neurology after discharge.[name]  Spoke with neurology Dr Otelia Limes yesterday. 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  Agitation. Improved. Was seen by psychiatry during hospitalization.  Essential hypertension Continue Norvasc. Stable blood pressure readings.  History of alcohol abuse No signs of withdrawal.   Continue  thiamine, folate, multivitamin. PRN ativan.  history of tobacco abuse On decreased dose of nicotine patch to 14 mg  Generalized deconditioning and debility PT and OT recommended skilled nursing facility placement on discharge.    Hypokalemia Resolved.  DVT prophylaxis: Lovenox  Code Status: Full code.    Family Communication:  I spoke with the patient's niece Ms Bjorn Loser and updated her about the clinical condition of the patient.   Disposition Plan:  Plan for skilled nursing facility placement when available.  Medicaid pending.  Patient was last seen by occupational Therapy on 02/14/2019 recommending skilled nursing facility.  Consultants:   Dr. Jannifer Franklin 02/04/2019  Neurology Dr Otelia Limes 02/15/19  Procedures:   CT head 12/13/2018  Abdominal films 12/15/2018  Chest x-ray 12/14/2018  MRI brain 12/15/2018  Antimicrobials:   IV Rocephin 12/14/2018> 12/17/2018  Subjective: Today, patient denies interval complaints.  Denies any shortness of breath, cough, fever.  Objective: Vitals:   02/14/19 2236 02/15/19 0900 02/15/19 1630 02/15/19 2140  BP: 113/86 104/68 111/69 129/86  Pulse: 71 77 78 70  Resp:  16 17 20   Temp: 97.9 F (36.6 C) 98 F (36.7 C) 99 F (37.2 C) 98.2 F (36.8 C)  TempSrc: Oral Oral Oral Oral  SpO2: 97% 99% 99% 93%  Weight:      Height:        Intake/Output Summary (Last 24 hours) at 02/16/2019 0721 Last data filed at 02/15/2019 1400 Gross per 24 hour  Intake 535 ml  Output -  Net 535 ml   Filed Weights   12/15/18 0101  Weight: 98.4 kg   Body mass index is 33.99 kg/m.   Physical Examination:  General:  obese built, not in  obvious distress, alert awake and communicative. HENT: Normocephalic, pupils equally reacting to light and accommodation.  No scleral pallor or icterus noted. Oral mucosa is moist.  Chest:  Clear breath sounds.  Diminished breath sounds bilaterally. No crackles or wheezes.  CVS: S1 &S2 heard. No murmur.  Regular rate and  rhythm. Abdomen: Soft, nontender, nondistended.  Bowel sounds are heard.  Liver is not palpable, no abdominal mass palpated Extremities: No cyanosis, clubbing or edema.  Peripheral pulses are palpable. Psych: Alert, awake and oriented to place and person, normal mood.  Mildly impaired memory. CNS:  No cranial nerve deficits.  Power equal in all extremities.  No sensory deficits noted.   Skin: Warm and dry.  No rashes noted.   Data Reviewed: I have personally reviewed following labs and imaging studies  CBC: No results for input(s): WBC, NEUTROABS, HGB, HCT, MCV, PLT in the last 168 hours. Basic Metabolic Panel: Recent Labs  Lab 02/10/19 0245 02/11/19 0445 02/15/19 0414  NA 139 139 137  K 3.3* 3.9 3.7  CL 104 103 101  CO2 25 27 27   GLUCOSE 87 84 89  BUN 13 13 15   CREATININE 0.56 0.61 0.66  CALCIUM 9.4 9.6 9.4  MG 1.9  --  2.0   GFR: Estimated Creatinine Clearance: 89 mL/min (by C-G formula based on SCr of 0.66 mg/dL). Liver Function Tests: No results for input(s): AST, ALT, ALKPHOS, BILITOT, PROT, ALBUMIN in the last 168 hours. No results for input(s): LIPASE, AMYLASE in the last 168 hours. No results for input(s): AMMONIA in the last 168 hours. Coagulation Profile: No results for input(s): INR, PROTIME in the last 168 hours. Cardiac Enzymes: No results for input(s): CKTOTAL, CKMB, CKMBINDEX, TROPONINI in the last 168 hours. BNP (last 3 results) No results for input(s): PROBNP in the last 8760 hours. HbA1C: No results for input(s): HGBA1C in the last 72 hours. CBG: Recent Labs  Lab 02/12/19 0805  GLUCAP 80   Lipid Profile: No results for input(s): CHOL, HDL, LDLCALC, TRIG, CHOLHDL, LDLDIRECT in the last 72 hours. Thyroid Function Tests: No results for input(s): TSH, T4TOTAL, FREET4, T3FREE, THYROIDAB in the last 72 hours. Anemia Panel: No results for input(s): VITAMINB12, FOLATE, FERRITIN, TIBC, IRON, RETICCTPCT in the last 72 hours. Sepsis Labs: No results for  input(s): PROCALCITON, LATICACIDVEN in the last 168 hours.  No results found for this or any previous visit (from the past 240 hour(s)).    Radiology Studies: No results found.   Scheduled Meds: . amLODipine  5 mg Oral Daily  . enoxaparin (LOVENOX) injection  40 mg Subcutaneous Q24H  . feeding supplement (ENSURE ENLIVE)  237 mL Oral BID BM  . folic acid  1 mg Oral Daily  . ketoconazole   Topical BID  . multivitamin with minerals  1 tablet Oral Daily  . nicotine  14 mg Transdermal Daily  . thiamine  100 mg Oral Daily   Continuous Infusions:   LOS: 65 days   Flora Lipps, MD [facility] 02/16/2019, 7:21 AM

## 2019-02-17 DIAGNOSIS — F1027 Alcohol dependence with alcohol-induced persisting dementia: Secondary | ICD-10-CM

## 2019-02-17 NOTE — Progress Notes (Signed)
PROGRESS NOTE    Marie Woods  ASN:[id number] DOB: 09-Mar-1955 DOA: 12/13/2018 PCP: Ladell Pier, MD    Brief Narrative:   As per HPI,   65 year old female with past medical history of asthma, hypertension, alcohol abuse was brought to the hospital with confusion and altered mental status.  She was found to have UTI; urine culture showed multiple species.  MRI brain was suggestive of possible Wernicke's encephalopathy.  Treated with thiamine.  PT subsequently evaluated the patient and recommended SNF placement.  Assessment & Plan:   Principal Problem:   AMS (altered mental status) Active Problems:   History of alcohol use   TOBACCO ABUSE   Essential hypertension   Confusion   Acute metabolic encephalopathy   Agitation   Acute lower UTI   Sepsis (HCC)  Severe sepsis, present on admission secondary to UTI. Resolved.  Urine cultures which were done showed multiple species.  Repeat culture showed < 10,000 colonies.  Patient was treated with IV Rocephin and has completed course of antibiotic.  Chest x-ray was negative.  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 Improved.  CT head which was done showed mild atrophic changes without acute intracranial abnormality.  MRI of the brain done showed symmetric diffusion weighted and T2/flair hyperintense signal abnormality within the medial thalami.  Findings favored sequelae of Wernicke encephalopathy.   UDS negative.  Completed IV thiamine.  Continue oral thiamine, folic acid, multivitamin.  Will need outpatient follow-up with neurology after discharge..  Seen by neurology and thought is  korsakoff dementia as well.  Essential hypertension Continue Norvasc. Stable blood pressure readings.  History of alcohol abuse No signs of withdrawal.   Continue thiamine, folate, multivitamin. PRN ativan.  history of tobacco abuse On decreased dose of nicotine patch to 14  mg  Generalized deconditioning and debility Awaiting for skilled nursing facility placement  Hypokalemia Resolved.  DVT prophylaxis: Lovenox  Code Status: Full code.    Family Communication:   None today. I spoke with the patient's niece Ms Suanne Marker yesterday  Disposition Plan:   Patient is medically stable for disposition.  Skilled nursing facility placement when available.  Medicaid pending.  Patient was last seen by occupational Therapy on 02/14/2019 recommending skilled nursing facility.  Consultants:   Dr. Darleene Cleaver 02/04/2019  Neurology Dr Cheral Marker 02/15/19  Procedures:   CT head 12/13/2018  Abdominal films 12/15/2018  Chest x-ray 12/14/2018  MRI brain 12/15/2018  Antimicrobials:   IV Rocephin 12/14/2018> 12/17/2018  Subjective: Today, No interval complains. No dizziness, shortness of breath, cough or fever.   Objective: Vitals:   02/15/19 1630 02/15/19 2140 02/16/19 1032 02/17/19 0800  BP: 111/69 129/86 113/82 111/76  Pulse: 78 70 81 69  Resp: 17 20 18 19   Temp: 99 F (37.2 C) 98.2 F (36.8 C) 97.7 F (36.5 C) 98.3 F (36.8 C)  TempSrc: Oral Oral Oral Oral  SpO2: 99% 93% 98% 97%  Weight:      Height:        Intake/Output Summary (Last 24 hours) at 02/17/2019 0836 Last data filed at 02/17/2019 0715 Gross per 24 hour  Intake 240 ml  Output --  Net 240 ml   Filed Weights   12/15/18 0101  Weight: 98.4 kg   Body mass index is 33.99 kg/m.   Physical Examination: General: Obese built, not in obvious distress HENT: Normocephalic, pupils equally reacting to light and accommodation.  No scleral pallor or icterus noted. Oral mucosa is moist.  Chest:  Clear breath sounds.  Diminished breath sounds bilaterally. No crackles or wheezes.  CVS: S1 &S2 heard. No murmur.  Regular rate and rhythm. Abdomen: Soft, nontender, nondistended.  Bowel sounds are heard.  Extremities: No cyanosis, clubbing or edema.  Psych: Alert, awake and oriented to place person.,  normal mood.  Mildly impaired memory CNS:  No cranial nerve deficits.  Power equal in all extremities.   Skin: Warm and dry.  No rashes noted.  General:  obese built, not in obvious distress, alert awake and communicative.  Data Reviewed: I have personally reviewed following labs and imaging studies  CBC: No results for input(s): WBC, NEUTROABS, HGB, HCT, MCV, PLT in the last 168 hours. Basic Metabolic Panel: Recent Labs  Lab 02/11/19 0445 02/15/19 0414  NA 139 137  K 3.9 3.7  CL 103 101  CO2 27 27  GLUCOSE 84 89  BUN 13 15  CREATININE 0.61 0.66  CALCIUM 9.6 9.4  MG  --  2.0   GFR: Estimated Creatinine Clearance: 89 mL/min (by C-G formula based on SCr of 0.66 mg/dL). Liver Function Tests: No results for input(s): AST, ALT, ALKPHOS, BILITOT, PROT, ALBUMIN in the last 168 hours. No results for input(s): LIPASE, AMYLASE in the last 168 hours. No results for input(s): AMMONIA in the last 168 hours. Coagulation Profile: No results for input(s): INR, PROTIME in the last 168 hours. Cardiac Enzymes: No results for input(s): CKTOTAL, CKMB, CKMBINDEX, TROPONINI in the last 168 hours. BNP (last 3 results) No results for input(s): PROBNP in the last 8760 hours. HbA1C: No results for input(s): HGBA1C in the last 72 hours. CBG: Recent Labs  Lab 02/12/19 0805  GLUCAP 80   Lipid Profile: No results for input(s): CHOL, HDL, LDLCALC, TRIG, CHOLHDL, LDLDIRECT in the last 72 hours. Thyroid Function Tests: No results for input(s): TSH, T4TOTAL, FREET4, T3FREE, THYROIDAB in the last 72 hours. Anemia Panel: No results for input(s): VITAMINB12, FOLATE, FERRITIN, TIBC, IRON, RETICCTPCT in the last 72 hours. Sepsis Labs: No results for input(s): PROCALCITON, LATICACIDVEN in the last 168 hours.  No results found for this or any previous visit (from the past 240 hour(s)).    Radiology Studies: No results found.   Scheduled Meds: . amLODipine  5 mg Oral Daily  . enoxaparin (LOVENOX)  injection  40 mg Subcutaneous Q24H  . feeding supplement (ENSURE ENLIVE)  237 mL Oral BID BM  . folic acid  1 mg Oral Daily  . ketoconazole   Topical BID  . multivitamin with minerals  1 tablet Oral Daily  . nicotine  14 mg Transdermal Daily  . thiamine  100 mg Oral Daily   Continuous Infusions:   LOS: 66 days   Joycelyn Das, MD [facility] 02/17/2019

## 2019-02-18 NOTE — Progress Notes (Signed)
Patient was given IM haldol @ 1740 due to agitation and wanting to elope from unit to go home.

## 2019-02-18 NOTE — Progress Notes (Signed)
PROGRESS NOTE    Marie Woods  [id number]:[id number] DOB: 10/18/1955 DOA: 12/13/2018 PCP: Marcine Matar, MD    Brief Narrative:   As per HPI,   64 year old female with past medical history of asthma, hypertension, alcohol abuse was brought to the hospital with confusion and altered mental status.  She was found to have UTI; urine culture showed multiple species.  MRI brain was suggestive of possible Wernicke's encephalopathy.  Treated with thiamine.  PT subsequently evaluated the patient and recommended SNF placement.  Assessment & Plan:   Principal Problem:   AMS (altered mental status) Active Problems:   History of alcohol use   TOBACCO ABUSE   Essential hypertension   Confusion   Acute metabolic encephalopathy   Agitation   Acute lower UTI   Sepsis (HCC)  Severe sepsis, present on admission secondary to UTI. Resolved.  Urine cultures which were done showed multiple species.  Repeat culture showed < 10,000 colonies.  Patient was treated with IV Rocephin and has completed course of antibiotic.  Chest x-ray was negative.  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 Improved.  CT head which was done showed mild atrophic changes without acute intracranial abnormality.  MRI of the brain done showed symmetric diffusion weighted and T2/flair hyperintense signal abnormality within the medial thalami.  Findings favored sequelae of Wernicke encephalopathy. 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  UDS negative.  Completed IV thiamine.  Continue oral thiamine, folic acid, multivitamin.  Will need outpatient follow-up with neurology after discharge..    Essential hypertension Continue Norvasc. Stable blood pressure  History of alcohol abuse No signs of withdrawal.   Continue thiamine, folate, multivitamin. PRN ativan.  history of tobacco abuse On decreased dose of nicotine patch to 14  mg. Will continue to taper  Generalized deconditioning and debility Awaiting for skilled nursing facility placement  Hypokalemia Resolved.  DVT prophylaxis: Lovenox  Code Status: Full code.    Family Communication:   None today.   Disposition Plan:   Patient is medically stable for disposition.  Skilled nursing facility placement when available.  Medicaid pending.  Patient was last seen by occupational Therapy on 02/14/2019 recommending skilled nursing facility.  Consultants:   Dr. Jannifer Franklin 02/04/2019  Neurology Dr Otelia Limes 02/15/19  Procedures:   CT head 12/13/2018  Abdominal films 12/15/2018  Chest x-ray 12/14/2018  MRI brain 12/15/2018  Antimicrobials:   IV Rocephin 12/14/2018> 12/17/2018  Subjective: Today, patient denies interval complaints.  Denies headache, dizziness, lightheadedness, shortness of breath.   Objective: Vitals:   02/16/19 1032 02/17/19 0800 02/17/19 1730 02/17/19 2155  BP: 113/82 111/76 (!) 115/93 122/85  Pulse: 81 69 88 75  Resp: 18 19 19 18   Temp: 97.7 F (36.5 C) 98.3 F (36.8 C) 98.8 F (37.1 C) 98 F (36.7 C)  TempSrc: Oral Oral Oral   SpO2: 98% 97% 100% 100%  Weight:      Height:        Intake/Output Summary (Last 24 hours) at 02/18/2019 0719 Last data filed at 02/17/2019 2120 Gross per 24 hour  Intake 240 ml  Output --  Net 240 ml   Filed Weights   12/15/18 0101  Weight: 98.4 kg   Body mass index is 33.99 kg/m.   Physical Examination:  General: Obese built, not in obvious distress HENT: Normocephalic, pupils equally reacting to light and accommodation.  No scleral pallor or icterus noted. Oral mucosa is moist.  Chest:  Clear breath sounds.  Diminished breath sounds bilaterally. No crackles or wheezes.  CVS: S1 &S2 heard. No murmur.  Regular rate and rhythm. Abdomen: Soft, nontender, nondistended.  Bowel sounds are heard.  Extremities: No cyanosis, clubbing or edema.  Psych: Alert, awake and oriented to place and  person., normal mood.  Mildly impaired memory CNS:  No cranial nerve deficits.  Power equal in all extremities.   Skin: Warm and dry.  No rashes noted.  General:  obese built, not in obvious distress, alert awake and communicative.  Data Reviewed: I have personally reviewed following labs and imaging studies  CBC: No results for input(s): WBC, NEUTROABS, HGB, HCT, MCV, PLT in the last 168 hours. Basic Metabolic Panel: Recent Labs  Lab 02/15/19 0414  NA 137  K 3.7  CL 101  CO2 27  GLUCOSE 89  BUN 15  CREATININE 0.66  CALCIUM 9.4  MG 2.0   GFR: Estimated Creatinine Clearance: 89 mL/min (by C-G formula based on SCr of 0.66 mg/dL). Liver Function Tests: No results for input(s): AST, ALT, ALKPHOS, BILITOT, PROT, ALBUMIN in the last 168 hours. No results for input(s): LIPASE, AMYLASE in the last 168 hours. No results for input(s): AMMONIA in the last 168 hours. Coagulation Profile: No results for input(s): INR, PROTIME in the last 168 hours. Cardiac Enzymes: No results for input(s): CKTOTAL, CKMB, CKMBINDEX, TROPONINI in the last 168 hours. BNP (last 3 results) No results for input(s): PROBNP in the last 8760 hours. HbA1C: No results for input(s): HGBA1C in the last 72 hours. CBG: Recent Labs  Lab 02/12/19 0805  GLUCAP 80   Lipid Profile: No results for input(s): CHOL, HDL, LDLCALC, TRIG, CHOLHDL, LDLDIRECT in the last 72 hours. Thyroid Function Tests: No results for input(s): TSH, T4TOTAL, FREET4, T3FREE, THYROIDAB in the last 72 hours. Anemia Panel: No results for input(s): VITAMINB12, FOLATE, FERRITIN, TIBC, IRON, RETICCTPCT in the last 72 hours. Sepsis Labs: No results for input(s): PROCALCITON, LATICACIDVEN in the last 168 hours.  No results found for this or any previous visit (from the past 240 hour(s)).    Radiology Studies: No results found.   Scheduled Meds: . amLODipine  5 mg Oral Daily  . enoxaparin (LOVENOX) injection  40 mg Subcutaneous Q24H  .  feeding supplement (ENSURE ENLIVE)  237 mL Oral BID BM  . folic acid  1 mg Oral Daily  . ketoconazole   Topical BID  . multivitamin with minerals  1 tablet Oral Daily  . nicotine  14 mg Transdermal Daily  . thiamine  100 mg Oral Daily   Continuous Infusions:   LOS: 67 days   Flora Lipps, MD [facility] 02/18/2019

## 2019-02-19 NOTE — Progress Notes (Signed)
PROGRESS NOTE    Marie Woods  [id number] DOB: 13-Jun-1955 DOA: 12/13/2018 PCP: Ladell Pier, MD    Brief Narrative:   As per HPI,   64 year old female with past medical history of asthma, hypertension, alcohol abuse was brought to the hospital with confusion and altered mental status.  She was found to have UTI; urine culture showed multiple species.  MRI brain was suggestive of possible Wernicke's encephalopathy.  Treated with thiamine.  PT subsequently evaluated the patient and recommended SNF placement.  Assessment & Plan:   Principal Problem:   AMS (altered mental status) Active Problems:   History of alcohol use   TOBACCO ABUSE   Essential hypertension   Confusion   Acute metabolic encephalopathy   Agitation   Acute lower UTI   Sepsis (HCC)  Severe sepsis, present on admission secondary to UTI. Resolved.  Urine cultures which were done showed multiple species.  Repeat culture showed < 10,000 colonies.  Patient was treated with IV Rocephin and has completed course of antibiotic.  Chest x-ray was negative.  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 Improved.  CT head which was done showed mild atrophic changes without acute intracranial abnormality.  MRI of the brain done showed symmetric diffusion weighted and T2/flair hyperintense signal abnormality within the medial thalami.  Findings favored sequelae of Wernicke encephalopathy. 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  UDS negative.  Completed IV thiamine.  Continue oral thiamine, folic acid, multivitamin.  Will need outpatient follow-up with neurology after discharge..    Essential hypertension Continue Norvasc. Stable blood pressure  History of alcohol abuse No signs of withdrawal.   Continue thiamine, folate, multivitamin. PRN ativan.  history of tobacco abuse On decreased dose of nicotine patch to 14  mg. Will continue to taper  Generalized deconditioning and debility Awaiting for skilled nursing facility placement  Hypokalemia Resolved.  DVT prophylaxis: Lovenox  Code Status: Full code.    Family Communication:   None today.   Disposition Plan:   Patient is medically stable for disposition.  Skilled nursing facility placement when available.  Medicaid pending.  Patient was last seen by occupational Therapy on 02/14/2019 recommending skilled nursing facility.  Consultants:   Dr. Darleene Cleaver 02/04/2019  Neurology Dr Cheral Marker 02/15/19  Procedures:   CT head 12/13/2018  Abdominal films 12/15/2018  Chest x-ray 12/14/2018  MRI brain 12/15/2018  Antimicrobials:   IV Rocephin 12/14/2018> 12/17/2018  Subjective: Today, patient denies interval complaints.  No shortness of breath cough fever. Had received haldol yesterday due to trying to elope from unit  Objective: Vitals:   02/17/19 1730 02/17/19 2155 02/18/19 0829 02/18/19 2300  BP: (!) 115/93 122/85 110/72 107/69  Pulse: 88 75 73 72  Resp: 19 18  12   Temp: 98.8 F (37.1 C) 98 F (36.7 C) 98.3 F (36.8 C) 98.6 F (37 C)  TempSrc: Oral   Oral  SpO2: 100% 100% 96% 97%  Weight:      Height:       No intake or output data in the 24 hours ending 02/19/19 0757 Filed Weights   12/15/18 0101  Weight: 98.4 kg   Body mass index is 33.99 kg/m.   Physical Examination:  General: Obese built, not in obvious distress.  Alert awake and communicative HENT: Normocephalic, pupils equally reacting to light and accommodation.  No scleral pallor or icterus noted. Oral mucosa is moist.  Chest:  Clear breath sounds.  Diminished  breath sounds bilaterally. No crackles or wheezes.  CVS: S1 &S2 heard. No murmur.  Regular rate and rhythm. Abdomen: Soft, nontender, nondistended.  Bowel sounds are heard.  Extremities: No cyanosis, clubbing or edema.  Psych: Alert, awake and oriented to place and person., normal mood.  Mildly impaired  memory CNS:  No cranial nerve deficits.  Power equal in all extremities.   Skin: Warm and dry.  No rashes noted.  General:  obese built, not in obvious distress, alert awake and communicative.  Data Reviewed: I have personally reviewed following labs and imaging studies  CBC: No results for input(s): WBC, NEUTROABS, HGB, HCT, MCV, PLT in the last 168 hours. Basic Metabolic Panel: Recent Labs  Lab 02/15/19 0414  NA 137  K 3.7  CL 101  CO2 27  GLUCOSE 89  BUN 15  CREATININE 0.66  CALCIUM 9.4  MG 2.0   GFR: Estimated Creatinine Clearance: 89 mL/min (by C-G formula based on SCr of 0.66 mg/dL). Liver Function Tests: No results for input(s): AST, ALT, ALKPHOS, BILITOT, PROT, ALBUMIN in the last 168 hours. No results for input(s): LIPASE, AMYLASE in the last 168 hours. No results for input(s): AMMONIA in the last 168 hours. Coagulation Profile: No results for input(s): INR, PROTIME in the last 168 hours. Cardiac Enzymes: No results for input(s): CKTOTAL, CKMB, CKMBINDEX, TROPONINI in the last 168 hours. BNP (last 3 results) No results for input(s): PROBNP in the last 8760 hours. HbA1C: No results for input(s): HGBA1C in the last 72 hours. CBG: Recent Labs  Lab 02/12/19 0805  GLUCAP 80   Lipid Profile: No results for input(s): CHOL, HDL, LDLCALC, TRIG, CHOLHDL, LDLDIRECT in the last 72 hours. Thyroid Function Tests: No results for input(s): TSH, T4TOTAL, FREET4, T3FREE, THYROIDAB in the last 72 hours. Anemia Panel: No results for input(s): VITAMINB12, FOLATE, FERRITIN, TIBC, IRON, RETICCTPCT in the last 72 hours. Sepsis Labs: No results for input(s): PROCALCITON, LATICACIDVEN in the last 168 hours.  No results found for this or any previous visit (from the past 240 hour(s)).    Radiology Studies: No results found.   Scheduled Meds: . amLODipine  5 mg Oral Daily  . enoxaparin (LOVENOX) injection  40 mg Subcutaneous Q24H  . feeding supplement (ENSURE ENLIVE)  237  mL Oral BID BM  . folic acid  1 mg Oral Daily  . ketoconazole   Topical BID  . multivitamin with minerals  1 tablet Oral Daily  . nicotine  14 mg Transdermal Daily  . thiamine  100 mg Oral Daily   Continuous Infusions:   LOS: 68 days   Joycelyn Das, MD [facility] 02/19/2019

## 2019-02-20 LAB — CBC
HCT: 35.6 % — ABNORMAL LOW (ref 36.0–46.0)
Hemoglobin: 11.8 g/dL — ABNORMAL LOW (ref 12.0–15.0)
MCH: 28.6 pg (ref 26.0–34.0)
MCHC: 33.1 g/dL (ref 30.0–36.0)
MCV: 86.4 fL (ref 80.0–100.0)
Platelets: 326 10*3/uL (ref 150–400)
RBC: 4.12 MIL/uL (ref 3.87–5.11)
RDW: 15.3 % (ref 11.5–15.5)
WBC: 7.8 10*3/uL (ref 4.0–10.5)
nRBC: 0 % (ref 0.0–0.2)

## 2019-02-20 LAB — BASIC METABOLIC PANEL
Anion gap: 9 (ref 5–15)
BUN: 15 mg/dL (ref 8–23)
CO2: 26 mmol/L (ref 22–32)
Calcium: 9.6 mg/dL (ref 8.9–10.3)
Chloride: 107 mmol/L (ref 98–111)
Creatinine, Ser: 0.75 mg/dL (ref 0.44–1.00)
GFR calc Af Amer: >60 mL/min (ref >60)
GFR calc non Af Amer: >60 mL/min (ref >60)
Glucose, Bld: 90 mg/dL (ref 70–99)
Potassium: 4.2 mmol/L (ref 3.5–5.1)
Sodium: 142 mmol/L (ref 135–145)

## 2019-02-20 LAB — MAGNESIUM: Magnesium: 1.9 mg/dL (ref 1.7–2.4)

## 2019-02-20 NOTE — TOC Progression Note (Signed)
Transition of Care [facility]) - Progression Note    Patient Details  Name: Marie Woods MRN: [id number] Date of Birth: 07/13/1955  Transition of [facility]) CM/SW Contact  Mearl Latin, LCSW Phone Number: 02/20/2019, 10:28 AM  Clinical Narrative:    Per Zeb Comfort, their clinical nurse is not able to review the referral at this time but will call CSW back.    Expected Discharge Plan: Skilled Nursing Facility Barriers to Discharge: No SNF bed, SNF Pending payor source - LOG  Expected Discharge Plan and Services Expected Discharge Plan: Skilled Nursing Facility In-house Referral: Clinical Social Work Discharge Planning Services: NA Post Acute Care Choice: Skilled Nursing Facility Living arrangements for the past 2 months: Apartment                 DME Arranged: N/A DME Agency: NA                   Social Determinants of Health (SDOH) Interventions    Readmission Risk Interventions No flowsheet data found.

## 2019-02-20 NOTE — Progress Notes (Signed)
PROGRESS NOTE    Marie Woods  [id number]:[id number] DOB: 1955-09-18 DOA: 12/13/2018 PCP: Marcine Matar, MD    Brief Narrative:   As per HPI,   64 year old female with past medical history of asthma, hypertension, alcohol abuse was brought to the hospital with confusion and altered mental status.  She was found to have UTI; urine culture showed multiple species.  MRI brain was suggestive of possible Wernicke's encephalopathy.  Treated with thiamine.  PT subsequently evaluated the patient and recommended SNF placement.  Assessment & Plan:   Principal Problem:   AMS (altered mental status) Active Problems:   History of alcohol use   TOBACCO ABUSE   Essential hypertension   Confusion   Acute metabolic encephalopathy   Agitation   Acute lower UTI   Sepsis (HCC)  Severe sepsis, present on admission secondary to UTI. Resolved.  Urine cultures which were done showed multiple species.  Repeat culture showed < 10,000 colonies.  Patient was treated with IV Rocephin and has completed course of antibiotic.  Chest x-ray was negative.  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 Improved.  CT head which was done showed mild atrophic changes without acute intracranial abnormality.  MRI of the brain done showed symmetric diffusion weighted and T2/flair hyperintense signal abnormality within the medial thalami.  Findings favored sequelae of Wernicke encephalopathy. 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  UDS negative.  Completed IV thiamine.  Continue oral thiamine, folic acid, multivitamin.  Will need outpatient follow-up with neurology after discharge..    Essential hypertension Continue Norvasc. Stable blood pressure  History of alcohol abuse No signs of withdrawal.   Continue thiamine, folate, multivitamin. PRN ativan.  history of tobacco abuse On decreased dose of nicotine patch to 14  mg. Will continue to taper  Generalized deconditioning and debility Awaiting for skilled nursing facility placement  Hypokalemia Resolved.  DVT prophylaxis: Lovenox  Code Status: Full code.    Family Communication:   None today.   Disposition Plan:   Patient is medically stable for disposition.  Skilled nursing facility placement when available.  Medicaid pending.  Patient was last seen by occupational Therapy on 02/14/2019 recommending skilled nursing facility.  Consultants:   Dr. Jannifer Franklin 02/04/2019  Neurology Dr Otelia Limes 02/15/19  Procedures:   CT head 12/13/2018  Abdominal films 12/15/2018  Chest x-ray 12/14/2018  MRI brain 12/15/2018  Antimicrobials:   IV Rocephin 12/14/2018> 12/17/2018  Subjective: Today, denies interval complaints.  Denies any nausea, vomiting shortness of breath fever chills   Objective: Vitals:   02/18/19 0829 02/18/19 2300 02/19/19 0915 02/19/19 2300  BP: 110/72 107/69 104/70 110/65  Pulse: 73 72 72 77  Resp:  12 18 14   Temp: 98.3 F (36.8 C) 98.6 F (37 C) 97.9 F (36.6 C) 97.9 F (36.6 C)  TempSrc:  Oral Oral Oral  SpO2: 96% 97% 96% 96%  Weight:      Height:       No intake or output data in the 24 hours ending 02/20/19 0746 Filed Weights   12/15/18 0101  Weight: 98.4 kg   Body mass index is 33.99 kg/m.   Physical Examination:  General:  obese built, not in obvious distress, alert awake and communicative HENT: Normocephalic, pupils equally reacting to light and accommodation.  No scleral pallor or icterus noted. Oral mucosa is moist.  Chest:  Clear breath sounds.  Diminished breath sounds bilaterally. No crackles or wheezes.  CVS: S1 &  S2 heard. No murmur.  Regular rate and rhythm. Abdomen: Soft, nontender, nondistended.  Bowel sounds are heard.  Liver is not palpable, no abdominal mass palpated Extremities: No cyanosis, clubbing or edema.  Peripheral pulses are palpable. Psych: Alert, awake and oriented to place and  person, normal mood, mildly impaired short-term memory CNS:  No cranial nerve deficits.  Power equal in all extremities.  No sensory deficits noted.  No cerebellar signs.   Skin: Warm and dry.  No rashes noted.   Data Reviewed: I have personally reviewed following labs and imaging studies  CBC: Recent Labs  Lab 02/20/19 0232  WBC 7.8  HGB 11.8*  HCT 35.6*  MCV 86.4  PLT 024   Basic Metabolic Panel: Recent Labs  Lab 02/15/19 0414 02/20/19 0232  NA 137 142  K 3.7 4.2  CL 101 107  CO2 27 26  GLUCOSE 89 90  BUN 15 15  CREATININE 0.66 0.75  CALCIUM 9.4 9.6  MG 2.0 1.9   GFR: Estimated Creatinine Clearance: 89 mL/min (by C-G formula based on SCr of 0.75 mg/dL). Liver Function Tests: No results for input(s): AST, ALT, ALKPHOS, BILITOT, PROT, ALBUMIN in the last 168 hours. No results for input(s): LIPASE, AMYLASE in the last 168 hours. No results for input(s): AMMONIA in the last 168 hours. Coagulation Profile: No results for input(s): INR, PROTIME in the last 168 hours. Cardiac Enzymes: No results for input(s): CKTOTAL, CKMB, CKMBINDEX, TROPONINI in the last 168 hours. BNP (last 3 results) No results for input(s): PROBNP in the last 8760 hours. HbA1C: No results for input(s): HGBA1C in the last 72 hours. CBG: No results for input(s): GLUCAP in the last 168 hours. Lipid Profile: No results for input(s): CHOL, HDL, LDLCALC, TRIG, CHOLHDL, LDLDIRECT in the last 72 hours. Thyroid Function Tests: No results for input(s): TSH, T4TOTAL, FREET4, T3FREE, THYROIDAB in the last 72 hours. Anemia Panel: No results for input(s): VITAMINB12, FOLATE, FERRITIN, TIBC, IRON, RETICCTPCT in the last 72 hours. Sepsis Labs: No results for input(s): PROCALCITON, LATICACIDVEN in the last 168 hours.  No results found for this or any previous visit (from the past 240 hour(s)).    Radiology Studies: No results found.   Scheduled Meds: . amLODipine  5 mg Oral Daily  . enoxaparin  (LOVENOX) injection  40 mg Subcutaneous Q24H  . feeding supplement (ENSURE ENLIVE)  237 mL Oral BID BM  . folic acid  1 mg Oral Daily  . ketoconazole   Topical BID  . multivitamin with minerals  1 tablet Oral Daily  . nicotine  14 mg Transdermal Daily  . thiamine  100 mg Oral Daily   Continuous Infusions:   LOS: 69 days   Flora Lipps, MD [facility] 02/20/2019

## 2019-02-21 NOTE — Progress Notes (Signed)
2125: Pt believes that she has just arrived to the hospital, and has dressed herself in her clothes. She was informed that she has been here since 11/10 - pt could not remember. She asked what she has been doing that whole time - discussed her memory impairments. During conversation, pt repeated the same questions multiple times, not remember that we had already discussed that topic.

## 2019-02-21 NOTE — Progress Notes (Signed)
Occupational Therapy Treatment Patient Details Name: Marie Woods MRN: [id number] DOB: [birth year] Today's Date: 02/21/2019    History of present illness Patient is a 64 year old female with medical history significant of asthma, hypertension, alcohol abuse who was brought in by EMS after neighbors called in stating that patient is confused and altered. Patient admitted Severe Sepsis with Acute metabolic encephalopathy secondary to UTI.    OT comments  Patient session focused on use of compensatory strategies for memory.  Patient utilized list making to complete 7 item scavenger hunt in room, requires mod-max cues to recall items initially to write on list and then min cueing for problem solving to utilize list during task.  Able to find all 7 items, problem solve through which items were still needed with min cueing.  Provided printed calendar to help patient keep up with the date, and use or organization and recall. Patient very thankful for education and encouraged use of compensatory techniques daily. Will follow.    Follow Up Recommendations  SNF;Supervision/Assistance - 24 hour    Equipment Recommendations  3 in 1 bedside commode    Recommendations for Other Services      Precautions / Restrictions Precautions Precautions: Fall Restrictions Weight Bearing Restrictions: No       Mobility Bed Mobility               General bed mobility comments: EOB upon entry   Transfers Overall transfer level: Needs assistance   Transfers: Sit to/from Stand Sit to Stand: Supervision         General transfer comment: Supervision for safety    Balance Overall balance assessment: Needs assistance Sitting-balance support: No upper extremity supported;Feet supported Sitting balance-Leahy Scale: Normal     Standing balance support: No upper extremity supported;Single extremity supported;During functional activity Standing balance-Leahy Scale: Fair Standing balance comment:  supervision                           ADL either performed or assessed with clinical judgement   ADL                                         General ADL Comments: focused on compensatory memory strategies to optimize recall during tasks     Vision       Perception     Praxis      Cognition Arousal/Alertness: Awake/alert Behavior During Therapy: WFL for tasks assessed/performed Overall Cognitive Status: History of cognitive impairments - at baseline Area of Impairment: Attention;Memory;Following commands;Safety/judgement;Awareness;Problem solving;Orientation                 Orientation Level: Disoriented to;Time Current Attention Level: Sustained Memory: Decreased short-term memory Following Commands: Follows one step commands consistently;Follows one step commands with increased time Safety/Judgement: Decreased awareness of safety;Decreased awareness of deficits Awareness: Emergent Problem Solving: Requires verbal cues;Difficulty sequencing General Comments: Challenging ST memory, problem solving, and attention with  scavenger hunt of 7 items in room.  Patient educated on utilizing compensatory strategies to aide memory, practiced using written list during task.  Patient able to write items on notepad with mod-max cueing for immediate recall after therapist listed items.  Min cueing to utilize list during task, and with using strategy able to complete task with only minimal cueing for probelm solving. Pt reporting she knows she is having difficulty with memory  and very appreciative of therapy.  Issued printed calender to improve memory, orientation; encouraged use daily.         Exercises     Shoulder Instructions       General Comments      Pertinent Vitals/ Pain       Pain Assessment: No/denies pain  Home Living                                          Prior Functioning/Environment              Frequency   Min 1X/week        Progress Toward Goals  OT Goals(current goals can now be found in the care plan section)  Progress towards OT goals: Progressing toward goals  Acute Rehab OT Goals Patient Stated Goal: return to PLOF OT Goal Formulation: With patient  Plan Discharge plan remains appropriate;Frequency remains appropriate    Co-evaluation                 AM-PAC OT "6 Clicks" Daily Activity     Outcome Measure   Help from another person eating meals?: None Help from another person taking care of personal grooming?: A Little Help from another person toileting, which includes using toliet, bedpan, or urinal?: A Little Help from another person bathing (including washing, rinsing, drying)?: A Little Help from another person to put on and taking off regular upper body clothing?: None Help from another person to put on and taking off regular lower body clothing?: A Little 6 Click Score: 20    End of Session    OT Visit Diagnosis: Unsteadiness on feet (R26.81);Other abnormalities of gait and mobility (R26.89);Muscle weakness (generalized) (M62.81);Other symptoms and signs involving cognitive function   Activity Tolerance Patient tolerated treatment well   Patient Left with call bell/phone within reach;Other (comment)(seated EOB)   Nurse Communication          Time: 2376-2831 OT Time Calculation (min): 22 min  Charges: OT General Charges $OT Visit: 1 Visit OT Treatments $Cognitive Funtion inital: Initial 15 mins $Cognitive Funtion additional: Additional15 mins  Marie Woods, OT Acute Rehabilitation Services Pager [phone] Office [phone]    Chancy Milroy 02/21/2019, 2:46 PM

## 2019-02-21 NOTE — Progress Notes (Signed)
PROGRESS NOTE    Marie Woods  SFK:[id number] DOB: 12-10-1955 DOA: 12/13/2018 PCP: Marcine Matar, MD    Brief Narrative:   As per HPI,   64 year old female with past medical history of asthma, hypertension, alcohol abuse was brought to the hospital with confusion and altered mental status.  She was found to have UTI; urine culture showed multiple species.  MRI brain was suggestive of possible Wernicke's encephalopathy.  Treated with thiamine.  PT subsequently evaluated the patient and recommended SNF placement.  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 Improved.  CT head which was done showed mild atrophic changes without acute intracranial abnormality.  MRI of the brain done showed symmetric diffusion weighted and T2/flair hyperintense signal abnormality within the medial thalami.  Findings favored sequelae of Wernicke encephalopathy. 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  UDS negative.  Completed IV thiamine.  Continue oral thiamine, folic acid, multivitamin.  Will need outpatient follow-up with neurology after discharge..    Severe sepsis, present on admission secondary to UTI. Resolved. Completed antibiotics.    Essential hypertension Continue Norvasc. Bblood pressure remains stable.  History of alcohol abuse No signs of withdrawal.   Continue thiamine, folate, multivitamin. PRN ativan.  history of tobacco abuse On decreased dose of nicotine patch to 14 mg. Will continue to taper  Generalized deconditioning and debility Awaiting for skilled nursing facility placement  Hypokalemia Resolved. Latest potassium of 4.2  DVT prophylaxis:  Lovenox  Code Status: Full code.    Family Communication:   I tried to reach the patient's niece Bjorn Loser on the phone but was unable to reach her.  Disposition Plan:   Patient is medically stable but has been determined to be unsafe for disposition.  Awaiting for skilled nursing facility placement, medicaid pending.  Patient was last seen by occupational Therapy on 02/14/2019 recommending skilled nursing facility.  Consultants:   Dr. Jannifer Franklin 02/04/2019  Neurology Dr Otelia Limes 02/15/19  Procedures:   CT head 12/13/2018  Abdominal films 12/15/2018  Chest x-ray 12/14/2018  MRI brain 12/15/2018  Antimicrobials:   IV Rocephin 12/14/2018> 12/17/2018  Subjective: Today, patient denies interval complaints.  Denies any nausea, vomiting or abdominal pain.  Objective: Vitals:   02/19/19 2300 02/20/19 0752 02/20/19 1634 02/20/19 2144  BP: 110/65 99/62 129/82 113/72  Pulse: 77 77 90 74  Resp: 14 19 19    Temp: 97.9 F (36.6 C) 98 F (36.7 C) 98.7 F (37.1 C) 98 F (36.7 C)  TempSrc: Oral     SpO2: 96% 100% 100% 100%  Weight:      Height:        Intake/Output Summary (Last 24 hours) at 02/21/2019 0711 Last data filed at 02/20/2019 0847 Gross per 24 hour  Intake 240 ml  Output --  Net 240 ml   Filed Weights   12/15/18 0101  Weight: 98.4 kg   Body mass index is 33.99 kg/m.   Physical Examination:  General:  obese built, not in obvious distress, alert awake and communicative HENT: Normocephalic, pupils equally reacting to light and accommodation.  No scleral pallor or icterus noted. Oral mucosa is moist.  Chest:  Clear breath sounds.  Diminished breath  sounds bilaterally. No crackles or wheezes.  CVS: S1 &S2 heard. No murmur.  Regular rate and rhythm. Abdomen: Soft, nontender, nondistended.  Bowel sounds are heard.  Extremities: No cyanosis, clubbing or edema.  Peripheral pulses are palpable. Psych: Alert, awake and oriented to place and person, normal mood, mildly  impaired short-term memory CNS:  No cranial nerve deficits.  Power equal in all extremities.   Skin: Warm and dry.  No rashes noted.   Data Reviewed: I have personally reviewed following labs and imaging studies  CBC: Recent Labs  Lab 02/20/19 0232  WBC 7.8  HGB 11.8*  HCT 35.6*  MCV 86.4  PLT 607   Basic Metabolic Panel: Recent Labs  Lab 02/15/19 0414 02/20/19 0232  NA 137 142  K 3.7 4.2  CL 101 107  CO2 27 26  GLUCOSE 89 90  BUN 15 15  CREATININE 0.66 0.75  CALCIUM 9.4 9.6  MG 2.0 1.9   GFR: Estimated Creatinine Clearance: 89 mL/min (by C-G formula based on SCr of 0.75 mg/dL). Liver Function Tests: No results for input(s): AST, ALT, ALKPHOS, BILITOT, PROT, ALBUMIN in the last 168 hours. No results for input(s): LIPASE, AMYLASE in the last 168 hours. No results for input(s): AMMONIA in the last 168 hours. Coagulation Profile: No results for input(s): INR, PROTIME in the last 168 hours. Cardiac Enzymes: No results for input(s): CKTOTAL, CKMB, CKMBINDEX, TROPONINI in the last 168 hours. BNP (last 3 results) No results for input(s): PROBNP in the last 8760 hours. HbA1C: No results for input(s): HGBA1C in the last 72 hours. CBG: No results for input(s): GLUCAP in the last 168 hours. Lipid Profile: No results for input(s): CHOL, HDL, LDLCALC, TRIG, CHOLHDL, LDLDIRECT in the last 72 hours. Thyroid Function Tests: No results for input(s): TSH, T4TOTAL, FREET4, T3FREE, THYROIDAB in the last 72 hours. Anemia Panel: No results for input(s): VITAMINB12, FOLATE, FERRITIN, TIBC, IRON, RETICCTPCT in the last 72 hours. Sepsis Labs: No results for input(s): PROCALCITON, LATICACIDVEN in the last 168 hours.  No results found for this or any previous visit (from the past 240 hour(s)).    Radiology Studies: No results found.   Scheduled Meds: . amLODipine  5 mg Oral Daily  . enoxaparin (LOVENOX) injection  40 mg Subcutaneous Q24H  . feeding supplement (ENSURE ENLIVE)   237 mL Oral BID BM  . folic acid  1 mg Oral Daily  . ketoconazole   Topical BID  . multivitamin with minerals  1 tablet Oral Daily  . nicotine  14 mg Transdermal Daily  . thiamine  100 mg Oral Daily   Continuous Infusions:   LOS: 70 days   Flora Lipps, MD [facility] 02/21/2019

## 2019-02-22 NOTE — Progress Notes (Signed)
PROGRESS NOTE    Marie Woods  [id number]:[id number] DOB: 1955/06/17 DOA: 12/13/2018 PCP: Ladell Pier, MD    Brief Narrative:  64 year old female with past medical history of asthma, hypertension, alcohol abuse was brought to the hospital with confusion and altered mental status.  She was found to have UTI; urine culture showed multiple species.  MRI brain was suggestive of possible Wernicke's encephalopathy.  Treated with thiamine.  PT subsequently evaluated the patient and recommended SNF placement.  Assessment & Plan: Acute metabolic encephalopathy likely secondary to Wernicke's encephalopathy and Korsakoff dementia-improved. MRI of the brain findings favored sequelae of Wernicke encephalopathy. 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  UDS negative.  Completed IV thiamine.   -Continue oral thiamine, folic acid, multivitamin.   -Will need outpatient follow-up with neurology after discharge.  Severe sepsis due to UTI: POA.  Resolved.  Completed antibiotic course.  Essential hypertension: Normotensive -Continue Norvasc  History of alcohol abuse: Outside withdrawal window. -Encourage quitting drinking. -Continue thiamine, folate, multivitamin. PRN ativan.  History of tobacco abuse -Encouraged cessation. -Continue nicotine patch to 14 mg. Will continue to taper  Generalized deconditioning and debility -Continue PT/OT-plan for discharge to SNF when bed is available  Hypokalemia: Resolved.  DVT prophylaxis: Lovenox  Code Status: Full code.    Family Communication: Patient and RN.  No family member at bedside.  Disposition Plan:  Patient is medically stable but has been determined to be unsafe for disposition.  Awaiting for skilled nursing facility placement, medicaid pending.  Consultants:   Dr. Darleene Cleaver 02/04/2019  Neurology Dr Cheral Marker 02/15/19  Procedures:   CT head 12/13/2018  Abdominal films 12/15/2018  Chest x-ray 12/14/2018  MRI brain  12/15/2018  Antimicrobials:   IV Rocephin 12/14/2018> 12/17/2018  Subjective: Today, patient denies interval complaints.  Denies any nausea, vomiting or abdominal pain.  Objective: Vitals:   02/21/19 1607 02/21/19 2259 02/22/19 0359 02/22/19 0753  BP: 116/83 131/87  107/77  Pulse: 79 92  80  Resp:  20    Temp: 98 F (36.7 C) 98.2 F (36.8 C)  98.4 F (36.9 C)  TempSrc: Oral   Oral  SpO2: 100% 100%  96%  Weight:   67.1 kg   Height:       No intake or output data in the 24 hours ending 02/22/19 1605 Filed Weights   12/15/18 0101 02/22/19 0359  Weight: 98.4 kg 67.1 kg   Body mass index is 23.18 kg/m.   Physical Examination: GENERAL: No acute distress.  Appears well.  HEENT: MMM.  Vision and hearing grossly intact.  NECK: Supple.  No apparent JVD.  RESP:  No IWOB. Good air movement bilaterally. CVS:  RRR. Heart sounds normal.  ABD/GI/GU: Bowel sounds present. Soft. Non tender.  MSK/EXT:  Moves extremities. No apparent deformity. No edema.  SKIN: no apparent skin lesion or wound NEURO: Awake, alert and oriented to self, place and month.  No apparent focal neuro deficit. PSYCH: Calm. Normal affect.  Data Reviewed: I have personally reviewed following labs and imaging studies  CBC: Recent Labs  Lab 02/20/19 0232  WBC 7.8  HGB 11.8*  HCT 35.6*  MCV 86.4  PLT 062   Basic Metabolic Panel: Recent Labs  Lab 02/20/19 0232  NA 142  K 4.2  CL 107  CO2 26  GLUCOSE 90  BUN 15  CREATININE 0.75  CALCIUM 9.6  MG 1.9   GFR: Estimated Creatinine Clearance: 71.8 mL/min (by C-G formula based on SCr of 0.75 mg/dL).  Liver Function Tests: No results for input(s): AST, ALT, ALKPHOS, BILITOT, PROT, ALBUMIN in the last 168 hours. No results for input(s): LIPASE, AMYLASE in the last 168 hours. No results for input(s): AMMONIA in the last 168 hours. Coagulation Profile: No results for input(s): INR, PROTIME in the last 168 hours. Cardiac Enzymes: No results for  input(s): CKTOTAL, CKMB, CKMBINDEX, TROPONINI in the last 168 hours. BNP (last 3 results) No results for input(s): PROBNP in the last 8760 hours. HbA1C: No results for input(s): HGBA1C in the last 72 hours. CBG: No results for input(s): GLUCAP in the last 168 hours. Lipid Profile: No results for input(s): CHOL, HDL, LDLCALC, TRIG, CHOLHDL, LDLDIRECT in the last 72 hours. Thyroid Function Tests: No results for input(s): TSH, T4TOTAL, FREET4, T3FREE, THYROIDAB in the last 72 hours. Anemia Panel: No results for input(s): VITAMINB12, FOLATE, FERRITIN, TIBC, IRON, RETICCTPCT in the last 72 hours. Sepsis Labs: No results for input(s): PROCALCITON, LATICACIDVEN in the last 168 hours.  No results found for this or any previous visit (from the past 240 hour(s)).    Radiology Studies: No results found.   Scheduled Meds: . amLODipine  5 mg Oral Daily  . enoxaparin (LOVENOX) injection  40 mg Subcutaneous Q24H  . feeding supplement (ENSURE ENLIVE)  237 mL Oral BID BM  . folic acid  1 mg Oral Daily  . ketoconazole   Topical BID  . multivitamin with minerals  1 tablet Oral Daily  . nicotine  14 mg Transdermal Daily  . thiamine  100 mg Oral Daily   Continuous Infusions:   LOS: 71 days   Almon Hercules, MD [facility] 02/22/2019

## 2019-02-23 NOTE — Plan of Care (Signed)
Updated pt on POC, pt verbalized undersanding

## 2019-02-23 NOTE — Progress Notes (Signed)
PROGRESS NOTE    Marie Woods  [id number]:[id number] DOB: 01-Oct-1955 DOA: 12/13/2018 PCP: Marcine Matar, MD    Brief Narrative:  64 year old female with past medical history of asthma, hypertension, alcohol abuse was brought to the hospital with confusion and altered mental status.  She was found to have UTI; urine culture showed multiple species.  MRI brain was suggestive of possible Wernicke's encephalopathy. Treated with thiamine.  PT subsequently evaluated the patient and recommended SNF placement.  Assessment & Plan: Acute metabolic encephalopathy likely secondary to Wernicke's encephalopathy and Korsakoff dementia-improved. MRI of the brain findings favored sequelae of Wernicke encephalopathy. 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  UDS negative.  Completed IV thiamine.  She is now oriented x4 except date and year. -Continue oral thiamine, folic acid, multivitamin.   -Will need outpatient follow-up with neurology after discharge.  Severe sepsis due to UTI: POA.  Resolved.  Completed antibiotic course.  Essential hypertension: Normotensive -Continue Norvasc  History of alcohol abuse: Outside withdrawal window. -Encourage quitting drinking. -Continue thiamine, folate, multivitamin. PRN ativan.  History of tobacco abuse -Encouraged cessation. -Continue nicotine patch to 14 mg. Will continue to taper  Generalized deconditioning and debility -Continue PT/OT-plan for discharge to SNF when bed is available  Hypokalemia: Resolved.  DVT prophylaxis: Lovenox  Code Status: Full code.    Family Communication: Patient and RN.  No family member at bedside.  Disposition Plan:  Patient is medically stable but has been determined to be unsafe for disposition.  Awaiting for skilled nursing facility placement, medicaid pending.  Consultants:   Dr. Jannifer Franklin 02/04/2019  Neurology Dr Otelia Limes 02/15/19  Procedures:   CT head 12/13/2018  Abdominal films 12/15/2018   Chest x-ray 12/14/2018  MRI brain 12/15/2018  Antimicrobials:   IV Rocephin 12/14/2018> 12/17/2018  Subjective: Today, patient denies interval complaints.  Denies any nausea, vomiting or abdominal pain.  Objective: Vitals:   02/22/19 0753 02/22/19 1722 02/22/19 2240 02/23/19 0825  BP: 107/77 105/82 134/88 (!) 96/59  Pulse: 80 78 75 81  Resp:   16 16  Temp: 98.4 F (36.9 C) 98.4 F (36.9 C) 97.9 F (36.6 C) 98.5 F (36.9 C)  TempSrc: Oral Oral Oral Oral  SpO2: 96% 100% 100% 100%  Weight:      Height:        Intake/Output Summary (Last 24 hours) at 02/23/2019 1517 Last data filed at 02/22/2019 2045 Gross per 24 hour  Intake 237 ml  Output -  Net 237 ml   Filed Weights   12/15/18 0101 02/22/19 0359  Weight: 98.4 kg 67.1 kg   Body mass index is 23.18 kg/m.   Physical Examination:  GENERAL: No acute distress.  Appears well.  HEENT: MMM.  Vision and hearing grossly intact.  NECK: Supple.  No apparent JVD.  RESP:  No IWOB. Good air movement bilaterally. CVS:  RRR. Heart sounds normal.  ABD/GI/GU: Bowel sounds present. Soft. Non tender.  MSK/EXT:  Moves extremities. No apparent deformity. No edema.  SKIN: no apparent skin lesion or wound NEURO: Awake, alert and oriented x4 except to date and year.  No apparent focal neuro deficit. PSYCH: Calm. Normal affect.  Data Reviewed: I have personally reviewed following labs and imaging studies  CBC: Recent Labs  Lab 02/20/19 0232  WBC 7.8  HGB 11.8*  HCT 35.6*  MCV 86.4  PLT 326   Basic Metabolic Panel: Recent Labs  Lab 02/20/19 0232  NA 142  K 4.2  CL 107  CO2 26  GLUCOSE 90  BUN 15  CREATININE 0.75  CALCIUM 9.6  MG 1.9   GFR: Estimated Creatinine Clearance: 71.8 mL/min (by C-G formula based on SCr of 0.75 mg/dL). Liver Function Tests: No results for input(s): AST, ALT, ALKPHOS, BILITOT, PROT, ALBUMIN in the last 168 hours. No results for input(s): LIPASE, AMYLASE in the last 168 hours. No results  for input(s): AMMONIA in the last 168 hours. Coagulation Profile: No results for input(s): INR, PROTIME in the last 168 hours. Cardiac Enzymes: No results for input(s): CKTOTAL, CKMB, CKMBINDEX, TROPONINI in the last 168 hours. BNP (last 3 results) No results for input(s): PROBNP in the last 8760 hours. HbA1C: No results for input(s): HGBA1C in the last 72 hours. CBG: No results for input(s): GLUCAP in the last 168 hours. Lipid Profile: No results for input(s): CHOL, HDL, LDLCALC, TRIG, CHOLHDL, LDLDIRECT in the last 72 hours. Thyroid Function Tests: No results for input(s): TSH, T4TOTAL, FREET4, T3FREE, THYROIDAB in the last 72 hours. Anemia Panel: No results for input(s): VITAMINB12, FOLATE, FERRITIN, TIBC, IRON, RETICCTPCT in the last 72 hours. Sepsis Labs: No results for input(s): PROCALCITON, LATICACIDVEN in the last 168 hours.  No results found for this or any previous visit (from the past 240 hour(s)).    Radiology Studies: No results found.   Scheduled Meds: . amLODipine  5 mg Oral Daily  . enoxaparin (LOVENOX) injection  40 mg Subcutaneous Q24H  . feeding supplement (ENSURE ENLIVE)  237 mL Oral BID BM  . folic acid  1 mg Oral Daily  . ketoconazole   Topical BID  . multivitamin with minerals  1 tablet Oral Daily  . nicotine  14 mg Transdermal Daily  . thiamine  100 mg Oral Daily   Continuous Infusions:   LOS: 72 days   Mercy Riding, MD [facility] 02/23/2019

## 2019-02-24 NOTE — Progress Notes (Signed)
PROGRESS NOTE  Marie Woods PNT:[id number] DOB: May 17, 1955   PCP: Ladell Pier, MD  Patient is from: Home.  DOA: 12/13/2018 LOS: 46  Brief Narrative / Interim history: 64 year old female with past medical history of asthma, hypertension, alcohol abuse was brought to the hospital with confusion and altered mental status. She was found to have UTI; urine culture showed multiple species. MRI brain was suggestive of possible Wernicke's encephalopathy. Treated with thiamine. PT subsequently evaluated the patient and recommended SNF placement.  Subjective: No major events overnight of this morning.  No complaint this morning.  Somewhat sleepy but wakes to voice easily.  She denies chest pain, dyspnea, GI or UTI symptoms.  Objective: Vitals:   02/23/19 1640 02/23/19 2257 02/24/19 0810 02/24/19 0955  BP: 110/73 112/68 93/66 123/90  Pulse: 78 65 72 87  Resp: 16 18 16    Temp: 98.5 F (36.9 C) 98.5 F (36.9 C) 98.4 F (36.9 C)   TempSrc: Oral Oral Oral   SpO2: 99% 98% 98% 99%  Weight:      Height:        Intake/Output Summary (Last 24 hours) at 02/24/2019 1153 Last data filed at 02/23/2019 1858 Gross per 24 hour  Intake 780 ml  Output -  Net 780 ml   Filed Weights   12/15/18 0101 02/22/19 0359  Weight: 98.4 kg 67.1 kg    Examination:  GENERAL: No acute distress.  Appears well.  HEENT: MMM.  Vision and hearing grossly intact.  NECK: Supple.  No apparent JVD.  RESP:  No IWOB. Good air movement bilaterally. CVS:  RRR. Heart sounds normal.  ABD/GI/GU: Bowel sounds present. Soft. Non tender.  MSK/EXT:  Moves extremities. No apparent deformity. No edema.  SKIN: no apparent skin lesion or wound NEURO: Awake, alert and oriented fairly.  No apparent focal neuro deficit. PSYCH: Calm. Normal affect.  Procedures:  None  Assessment & Plan: Acute metabolic encephalopathy likely secondary to Wernicke's encephalopathy and Korsakoff dementia-improved. MRI of the brain  findings favored sequelae of Wernicke encephalopathy. 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  UDS negative.  Completed IV thiamine.  She is now oriented x4 except date and year. -Continue oral thiamine, folic acid, multivitamin.   -Will need outpatient follow-up with neurology after discharge.  Severe sepsis due to UTI: POA.  Resolved.  Completed antibiotic course.  Essential hypertension: Normotensive -Continue Norvasc  History of alcohol abuse: Outside withdrawal window. -Encourage quitting drinking. -Continue thiamine, folate, multivitamin. PRN ativan.  History of tobacco abuse -Encouraged cessation. -Continue nicotine patch to 14 mg. Will continue to taper  Generalized deconditioning and debility -Continue PT/OT-plan for discharge to SNF when bed is available  Hypokalemia: Resolved.   Nutrition Problem: Increased nutrient needs Etiology: acute illness  Signs/Symptoms: estimated needs  Interventions: Ensure Enlive (each supplement provides 350kcal and 20 grams of protein), MVI   DVT prophylaxis: Subcu Lovenox Code Status: Full code Family Communication: Patient and/or RN. Available if any question.  Disposition Plan: Patient is medically stable but has been determined to be unsafe for disposition home, and waiting on SNF placement.  Medicaid pending. Consultants: Dr. Darleene Cleaver 02/04/2019, Neurology Dr Cheral Marker 02/15/19   Microbiology summarized: 11/10-COVID-19 negative. 11/10-urine culture with multiple species. 11/12-urine culture with insignificant growth.  Sch Meds:  Scheduled Meds: . amLODipine  5 mg Oral Daily  . enoxaparin (LOVENOX) injection  40 mg Subcutaneous Q24H  . feeding supplement (ENSURE ENLIVE)  237 mL Oral BID BM  . folic acid  1 mg  Oral Daily  . ketoconazole   Topical BID  . multivitamin with minerals  1 tablet Oral Daily  . nicotine  14 mg Transdermal Daily  . thiamine  100 mg Oral Daily   Continuous Infusions: PRN  Meds:.acetaminophen **OR** acetaminophen, albuterol, alum & mag hydroxide-simeth, camphor-menthol, haloperidol lactate, hydrocerin, hydrOXYzine, loratadine, LORazepam, meclizine, ondansetron **OR** ondansetron (ZOFRAN) IV, phenol, traMADol, traZODone  Antimicrobials: Anti-infectives (From admission, onward)   Start     Dose/Rate Route Frequency Ordered Stop   12/14/18 0100  cefTRIAXone (ROCEPHIN) 1 g in sodium chloride 0.9 % 100 mL IVPB  Status:  Discontinued     1 g 200 mL/hr over 30 Minutes Intravenous Every 24 hours 12/14/18 0010 12/17/18 0948       I have personally reviewed the following labs and images: CBC: Recent Labs  Lab 02/20/19 0232  WBC 7.8  HGB 11.8*  HCT 35.6*  MCV 86.4  PLT 326   BMP &GFR Recent Labs  Lab 02/20/19 0232  NA 142  K 4.2  CL 107  CO2 26  GLUCOSE 90  BUN 15  CREATININE 0.75  CALCIUM 9.6  MG 1.9   Estimated Creatinine Clearance: 71.8 mL/min (by C-G formula based on SCr of 0.75 mg/dL). Liver & Pancreas: No results for input(s): AST, ALT, ALKPHOS, BILITOT, PROT, ALBUMIN in the last 168 hours. No results for input(s): LIPASE, AMYLASE in the last 168 hours. No results for input(s): AMMONIA in the last 168 hours. Diabetic: No results for input(s): HGBA1C in the last 72 hours. No results for input(s): GLUCAP in the last 168 hours. Cardiac Enzymes: No results for input(s): CKTOTAL, CKMB, CKMBINDEX, TROPONINI in the last 168 hours. No results for input(s): PROBNP in the last 8760 hours. Coagulation Profile: No results for input(s): INR, PROTIME in the last 168 hours. Thyroid Function Tests: No results for input(s): TSH, T4TOTAL, FREET4, T3FREE, THYROIDAB in the last 72 hours. Lipid Profile: No results for input(s): CHOL, HDL, LDLCALC, TRIG, CHOLHDL, LDLDIRECT in the last 72 hours. Anemia Panel: No results for input(s): VITAMINB12, FOLATE, FERRITIN, TIBC, IRON, RETICCTPCT in the last 72 hours. Urine analysis:    Component Value Date/Time    COLORURINE AMBER (A) 12/13/2018 2218   APPEARANCEUR HAZY (A) 12/13/2018 2218   LABSPEC 1.027 12/13/2018 2218   PHURINE 5.0 12/13/2018 2218   GLUCOSEU NEGATIVE 12/13/2018 2218   HGBUR SMALL (A) 12/13/2018 2218   HGBUR small 06/28/2009 1504   BILIRUBINUR SMALL (A) 12/13/2018 2218   KETONESUR 5 (A) 12/13/2018 2218   PROTEINUR 30 (A) 12/13/2018 2218   UROBILINOGEN 1.0 09/28/2014 1426   NITRITE NEGATIVE 12/13/2018 2218   LEUKOCYTESUR TRACE (A) 12/13/2018 2218   Sepsis Labs: Invalid input(s): PROCALCITONIN, LACTICIDVEN  Microbiology: No results found for this or any previous visit (from the past 240 hour(s)).  Radiology Studies: No results found.    Morley Gaumer T. Rosi Secrist [facility]  If 7PM-7AM, please contact night-coverage [email or web address] Password [facility] 02/24/2019, 11:53 AM

## 2019-02-25 LAB — RENAL FUNCTION PANEL
Albumin: 3.2 g/dL — ABNORMAL LOW (ref 3.5–5.0)
Anion gap: 9 (ref 5–15)
BUN: 14 mg/dL (ref 8–23)
CO2: 27 mmol/L (ref 22–32)
Calcium: 9.5 mg/dL (ref 8.9–10.3)
Chloride: 103 mmol/L (ref 98–111)
Creatinine, Ser: 0.58 mg/dL (ref 0.44–1.00)
GFR calc Af Amer: >60 mL/min (ref >60)
GFR calc non Af Amer: >60 mL/min (ref >60)
Glucose, Bld: 88 mg/dL (ref 70–99)
Phosphorus: 4.8 mg/dL — ABNORMAL HIGH (ref 2.5–4.6)
Potassium: 3.8 mmol/L (ref 3.5–5.1)
Sodium: 139 mmol/L (ref 135–145)

## 2019-02-25 LAB — CBC
HCT: 32.1 % — ABNORMAL LOW (ref 36.0–46.0)
Hemoglobin: 10.8 g/dL — ABNORMAL LOW (ref 12.0–15.0)
MCH: 29.1 pg (ref 26.0–34.0)
MCHC: 33.6 g/dL (ref 30.0–36.0)
MCV: 86.5 fL (ref 80.0–100.0)
Platelets: 310 10*3/uL (ref 150–400)
RBC: 3.71 MIL/uL — ABNORMAL LOW (ref 3.87–5.11)
RDW: 15 % (ref 11.5–15.5)
WBC: 6.7 10*3/uL (ref 4.0–10.5)
nRBC: 0 % (ref 0.0–0.2)

## 2019-02-25 LAB — MAGNESIUM: Magnesium: 2.1 mg/dL (ref 1.7–2.4)

## 2019-02-25 NOTE — Progress Notes (Signed)
PROGRESS NOTE  Marie Woods ZDG:[id number] DOB: 08-31-1955   PCP: Marcine Matar, MD  Patient is from: Home.  DOA: 12/13/2018 LOS: 74  Brief Narrative / Interim history: 64 year old female with past medical history of asthma, hypertension, alcohol abuse was brought to the hospital with confusion and altered mental status. She was found to have UTI; urine culture showed multiple species. MRI brain was suggestive of possible Wernicke's encephalopathy. Treated with thiamine. OT subsequently evaluated the patient and recommended SNF placement. Per OT evaluation on 02/08/2019, "patient engaged in pill box test to assess functional cognition. Patient failed the assessment, demonstrating poor planning, attention, sequencing, mental flexibility, suboptimal search strategies, concrete thinking and the inability to multitask. Patient had a total of13 errors, where more than 3 errors is considered a fail."    Subjective: No major events overnight or this morning.  No complaints but asking when she could go home.  She also questioning why she cannot go home.  She denies chest pain, dyspnea, GI or UTI symptoms.  Objective: Vitals:   02/24/19 1552 02/24/19 2225 02/24/19 2248 02/25/19 0803  BP: 107/80  111/72 93/70  Pulse: 78  81 66  Resp: 16  18 16   Temp: 98.2 F (36.8 C)  97.8 F (36.6 C) 97.7 F (36.5 C)  TempSrc: Oral   Oral  SpO2: 100% 97% 99% 99%  Weight:      Height:       No intake or output data in the 24 hours ending 02/25/19 1322 Filed Weights   12/15/18 0101 02/22/19 0359  Weight: 98.4 kg 67.1 kg    Examination:  GENERAL: No acute distress.  Appears well.  HEENT: MMM.  Vision and hearing grossly intact.  NECK: Supple.  No apparent JVD.  RESP:  No IWOB. Good air movement bilaterally. CVS:  RRR. Heart sounds normal.  ABD/GI/GU: Bowel sounds present. Soft. Non tender.  MSK/EXT:  Moves extremities. No apparent deformity. No edema.  SKIN: no apparent skin lesion or  wound NEURO: Awake, alert and oriented fairly.  No apparent focal neuro deficit. PSYCH: Calm. Normal affect.  Limited insight.  Procedures:  None  Assessment & Plan: Acute metabolic encephalopathy likely secondary to Wernicke's encephalopathy and Korsakoff dementia-improved. MRI of the brain findings favored sequelae of Wernicke encephalopathy. 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  UDS negative.  Completed IV thiamine.  She is now oriented x4 except date and year. -Continue oral thiamine, folic acid, multivitamin.   -Will need outpatient follow-up with neurology after discharge. -OT recommended SNF due to limited functional cognition  Severe sepsis due to UTI: POA.  Resolved.  Completed antibiotic course.  Essential hypertension: Normotensive -Continue Norvasc  History of alcohol abuse: Outside withdrawal window. -Encourage quitting drinking. -Continue thiamine, folate, multivitamin. PRN ativan.  History of tobacco abuse -Encouraged cessation. -Continue nicotine patch to 14 mg. Will continue to taper  Generalized deconditioning and debility -Continue PT/OT-plan for discharge to SNF when bed is available  Hypokalemia: Resolved.   Nutrition Problem: Increased nutrient needs Etiology: acute illness  Signs/Symptoms: estimated needs  Interventions: Ensure Enlive (each supplement provides 350kcal and 20 grams of protein), MVI   DVT prophylaxis: Subcu Lovenox Code Status: Full code Family Communication: Patient and/or RN. Available if any question.  Disposition Plan: Patient is medically stable but has been determined to be unsafe for disposition home due to here functional cognition, and waiting on SNF placement.  Medicaid pending. Consultants: Dr. 02/24/19 02/04/2019, Neurology Dr 04/04/2019 02/15/19   Microbiology  summarized: 11/10-COVID-19 negative. 11/10-urine culture with multiple species. 11/12-urine culture with insignificant growth.  Sch Meds:   Scheduled Meds: . amLODipine  5 mg Oral Daily  . enoxaparin (LOVENOX) injection  40 mg Subcutaneous Q24H  . feeding supplement (ENSURE ENLIVE)  237 mL Oral BID BM  . folic acid  1 mg Oral Daily  . ketoconazole   Topical BID  . multivitamin with minerals  1 tablet Oral Daily  . nicotine  14 mg Transdermal Daily  . thiamine  100 mg Oral Daily   Continuous Infusions: PRN Meds:.acetaminophen **OR** acetaminophen, albuterol, alum & mag hydroxide-simeth, camphor-menthol, haloperidol lactate, hydrocerin, hydrOXYzine, loratadine, LORazepam, meclizine, ondansetron **OR** ondansetron (ZOFRAN) IV, phenol, traMADol, traZODone  Antimicrobials: Anti-infectives (From admission, onward)   Start     Dose/Rate Route Frequency Ordered Stop   12/14/18 0100  cefTRIAXone (ROCEPHIN) 1 g in sodium chloride 0.9 % 100 mL IVPB  Status:  Discontinued     1 g 200 mL/hr over 30 Minutes Intravenous Every 24 hours 12/14/18 0010 12/17/18 0948       I have personally reviewed the following labs and images: CBC: Recent Labs  Lab 02/20/19 0232 02/25/19 0520  WBC 7.8 6.7  HGB 11.8* 10.8*  HCT 35.6* 32.1*  MCV 86.4 86.5  PLT 326 310   BMP &GFR Recent Labs  Lab 02/20/19 0232 02/25/19 0520  NA 142 139  K 4.2 3.8  CL 107 103  CO2 26 27  GLUCOSE 90 88  BUN 15 14  CREATININE 0.75 0.58  CALCIUM 9.6 9.5  MG 1.9 2.1  PHOS  --  4.8*   Estimated Creatinine Clearance: 71.8 mL/min (by C-G formula based on SCr of 0.58 mg/dL). Liver & Pancreas: Recent Labs  Lab 02/25/19 0520  ALBUMIN 3.2*   No results for input(s): LIPASE, AMYLASE in the last 168 hours. No results for input(s): AMMONIA in the last 168 hours. Diabetic: No results for input(s): HGBA1C in the last 72 hours. No results for input(s): GLUCAP in the last 168 hours. Cardiac Enzymes: No results for input(s): CKTOTAL, CKMB, CKMBINDEX, TROPONINI in the last 168 hours. No results for input(s): PROBNP in the last 8760 hours. Coagulation  Profile: No results for input(s): INR, PROTIME in the last 168 hours. Thyroid Function Tests: No results for input(s): TSH, T4TOTAL, FREET4, T3FREE, THYROIDAB in the last 72 hours. Lipid Profile: No results for input(s): CHOL, HDL, LDLCALC, TRIG, CHOLHDL, LDLDIRECT in the last 72 hours. Anemia Panel: No results for input(s): VITAMINB12, FOLATE, FERRITIN, TIBC, IRON, RETICCTPCT in the last 72 hours. Urine analysis:    Component Value Date/Time   COLORURINE AMBER (A) 12/13/2018 2218   APPEARANCEUR HAZY (A) 12/13/2018 2218   LABSPEC 1.027 12/13/2018 2218   PHURINE 5.0 12/13/2018 2218   GLUCOSEU NEGATIVE 12/13/2018 2218   HGBUR SMALL (A) 12/13/2018 2218   HGBUR small 06/28/2009 1504   BILIRUBINUR SMALL (A) 12/13/2018 2218   KETONESUR 5 (A) 12/13/2018 2218   PROTEINUR 30 (A) 12/13/2018 2218   UROBILINOGEN 1.0 09/28/2014 1426   NITRITE NEGATIVE 12/13/2018 2218   LEUKOCYTESUR TRACE (A) 12/13/2018 2218   Sepsis Labs: Invalid input(s): PROCALCITONIN, [place]  Microbiology: No results found for this or any previous visit (from the past 240 hour(s)).  Radiology Studies: No results found.    Yekaterina Escutia T. Villas  If 7PM-7AM, please contact night-coverage [email or web address] Password [email or web address] 02/25/2019, 1:22 PM

## 2019-02-26 NOTE — Progress Notes (Signed)
PROGRESS NOTE  Marie Woods JXB:[id number] DOB: 1955-10-13   PCP: Marcine Matar, MD  Patient is from: Home.  DOA: 12/13/2018 LOS: 75  Brief Narrative / Interim history: 64 year old female with past medical history of asthma, hypertension, alcohol abuse was brought to the hospital with confusion and altered mental status. She was found to have UTI; urine culture showed multiple species. MRI brain was suggestive of possible Wernicke's encephalopathy. Treated with thiamine. OT subsequently evaluated the patient and recommended SNF placement. Per OT evaluation on 02/08/2019, "patient engaged in pill box test to assess functional cognition. Patient failed the assessment, demonstrating poor planning, attention, sequencing, mental flexibility, suboptimal search strategies, concrete thinking and the inability to multitask. Patient had a total of13 errors, where more than 3 errors is considered a fail."    Subjective: No major events overnight or this morning.  No complaints this morning.  She denies chest pain, dyspnea, GI or UTI symptoms.  She has no recollection of our discussion yesterday.  Objective: Vitals:   02/25/19 1654 02/25/19 1744 02/25/19 2100 02/26/19 0857  BP:  118/75 140/72 117/75  Pulse: 81 88 79 83  Resp: 18 16 14 17   Temp:  98.3 F (36.8 C) (!) 97.5 F (36.4 C) 98.4 F (36.9 C)  TempSrc:  Oral Oral   SpO2: 98% 100% 100% 100%  Weight:      Height:       No intake or output data in the 24 hours ending 02/26/19 1403 Filed Weights   12/15/18 0101 02/22/19 0359  Weight: 98.4 kg 67.1 kg    Examination:  GENERAL: No acute distress.  Appears well.  HEENT: MMM.  Vision and hearing grossly intact.  NECK: Supple.  No apparent JVD.  RESP:  No IWOB. Good air movement bilaterally. CVS:  RRR. Heart sounds normal.  ABD/GI/GU: Bowel sounds present. Soft. Non tender.  MSK/EXT:  Moves extremities. No apparent deformity. No edema.  SKIN: no apparent skin lesion or  wound NEURO: Awake, alert and oriented fairly..  No apparent focal neuro deficit. PSYCH: Calm. Normal affect.  Procedures:  None  Assessment & Plan: Acute metabolic encephalopathy likely secondary to Wernicke's encephalopathy and Korsakoff dementia-improved. MRI of the brain findings favored sequelae of Wernicke encephalopathy. 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  UDS negative.  Completed IV thiamine.  She is now oriented x4 except date and year but no recollection of our discussion yesterday. OT recommended SNF due to limited functional cognition -Continue oral thiamine, folic acid, multivitamin.   -Will need outpatient follow-up with neurology after discharge.  Severe sepsis due to UTI: POA.  Resolved.  Completed antibiotic course.  Essential hypertension: Normotensive -Continue Norvasc  History of alcohol abuse: Outside withdrawal window. -Encourage quitting drinking. -Continue thiamine, folate, multivitamin. PRN ativan.  History of tobacco abuse -Encouraged cessation. -Continue nicotine patch to 14 mg. Will continue to taper  Generalized deconditioning and debility -Continue PT/OT-plan for discharge to SNF when bed is available  Hypokalemia: Resolved.   Nutrition Problem: Increased nutrient needs Etiology: acute illness  Signs/Symptoms: estimated needs  Interventions: Ensure Enlive (each supplement provides 350kcal and 20 grams of protein), MVI   DVT prophylaxis: Subcu Lovenox Code Status: Full code Family Communication: Patient and/or RN. Available if any question.  Disposition Plan: Patient is medically stable but has been determined to be unsafe for disposition home due to here functional cognition, and waiting on SNF placement.  Medicaid pending. Consultants: Dr. 02/24/19 02/04/2019, Neurology Dr 04/04/2019 02/15/19   Microbiology  summarized: 11/10-COVID-19 negative. 11/10-urine culture with multiple species. 11/12-urine culture with  insignificant growth.  Sch Meds:  Scheduled Meds: . amLODipine  5 mg Oral Daily  . enoxaparin (LOVENOX) injection  40 mg Subcutaneous Q24H  . feeding supplement (ENSURE ENLIVE)  237 mL Oral BID BM  . folic acid  1 mg Oral Daily  . ketoconazole   Topical BID  . multivitamin with minerals  1 tablet Oral Daily  . nicotine  14 mg Transdermal Daily  . thiamine  100 mg Oral Daily   Continuous Infusions: PRN Meds:.acetaminophen **OR** acetaminophen, albuterol, alum & mag hydroxide-simeth, camphor-menthol, haloperidol lactate, hydrocerin, hydrOXYzine, loratadine, LORazepam, meclizine, ondansetron **OR** ondansetron (ZOFRAN) IV, phenol, traMADol, traZODone  Antimicrobials: Anti-infectives (From admission, onward)   Start     Dose/Rate Route Frequency Ordered Stop   12/14/18 0100  cefTRIAXone (ROCEPHIN) 1 g in sodium chloride 0.9 % 100 mL IVPB  Status:  Discontinued     1 g 200 mL/hr over 30 Minutes Intravenous Every 24 hours 12/14/18 0010 12/17/18 0948       I have personally reviewed the following labs and images: CBC: Recent Labs  Lab 02/20/19 0232 02/25/19 0520  WBC 7.8 6.7  HGB 11.8* 10.8*  HCT 35.6* 32.1*  MCV 86.4 86.5  PLT 326 310   BMP &GFR Recent Labs  Lab 02/20/19 0232 02/25/19 0520  NA 142 139  K 4.2 3.8  CL 107 103  CO2 26 27  GLUCOSE 90 88  BUN 15 14  CREATININE 0.75 0.58  CALCIUM 9.6 9.5  MG 1.9 2.1  PHOS  --  4.8*   Estimated Creatinine Clearance: 71.8 mL/min (by C-G formula based on SCr of 0.58 mg/dL). Liver & Pancreas: Recent Labs  Lab 02/25/19 0520  ALBUMIN 3.2*   No results for input(s): LIPASE, AMYLASE in the last 168 hours. No results for input(s): AMMONIA in the last 168 hours. Diabetic: No results for input(s): HGBA1C in the last 72 hours. No results for input(s): GLUCAP in the last 168 hours. Cardiac Enzymes: No results for input(s): CKTOTAL, CKMB, CKMBINDEX, TROPONINI in the last 168 hours. No results for input(s): PROBNP in the last  8760 hours. Coagulation Profile: No results for input(s): INR, PROTIME in the last 168 hours. Thyroid Function Tests: No results for input(s): TSH, T4TOTAL, FREET4, T3FREE, THYROIDAB in the last 72 hours. Lipid Profile: No results for input(s): CHOL, HDL, LDLCALC, TRIG, CHOLHDL, LDLDIRECT in the last 72 hours. Anemia Panel: No results for input(s): VITAMINB12, FOLATE, FERRITIN, TIBC, IRON, RETICCTPCT in the last 72 hours. Urine analysis:    Component Value Date/Time   COLORURINE AMBER (A) 12/13/2018 2218   APPEARANCEUR HAZY (A) 12/13/2018 2218   LABSPEC 1.027 12/13/2018 2218   PHURINE 5.0 12/13/2018 2218   GLUCOSEU NEGATIVE 12/13/2018 2218   HGBUR SMALL (A) 12/13/2018 2218   HGBUR small 06/28/2009 1504   BILIRUBINUR SMALL (A) 12/13/2018 2218   KETONESUR 5 (A) 12/13/2018 2218   PROTEINUR 30 (A) 12/13/2018 2218   UROBILINOGEN 1.0 09/28/2014 1426   NITRITE NEGATIVE 12/13/2018 2218   LEUKOCYTESUR TRACE (A) 12/13/2018 2218   Sepsis Labs: Invalid input(s): PROCALCITONIN, [place]  Microbiology: No results found for this or any previous visit (from the past 240 hour(s)).  Radiology Studies: No results found.    Gissell Barra T. [place]  If 7PM-7AM, please contact night-coverage [email or web address] Password [facility] 02/26/2019, 2:03 PM

## 2019-02-27 NOTE — TOC Progression Note (Signed)
Transition of Care [facility]) - Progression Note    Patient Details  Name: [name] MRN: [id number] Date of Birth: 17-Jul-1955  Transition of Care [facility]) CM/SW Contact  Mearl Latin, [place] Phone Number: 02/27/2019, 2:38 PM  Clinical Narrative:    [facility] unable to accept patient.    Expected Discharge Plan: Skilled Nursing Facility Barriers to Discharge: No SNF bed, SNF Pending payor source - LOG  Expected Discharge Plan and Services Expected Discharge Plan: Skilled Nursing Facility In-house Referral: Clinical Social Work Discharge Planning Services: NA Post Acute Care Choice: Skilled Nursing Facility Living arrangements for the past 2 months: Apartment                 DME Arranged: N/A DME Agency: NA                   Social Determinants of Health (SDOH) Interventions    Readmission Risk Interventions No flowsheet data found.

## 2019-02-27 NOTE — Progress Notes (Signed)
Occupational Therapy Treatment Patient Details Name: Marie Woods MRN: [id number] DOB: Jun 22, 1955 Today's Date: 02/27/2019    History of present illness Patient is a 64 year old female with medical history significant of asthma, hypertension, alcohol abuse who was brought in by EMS after neighbors called in stating that patient is confused and altered. Patient admitted Severe Sepsis with Acute metabolic encephalopathy secondary to UTI.    OT comments  New Goals established today, Pt progressing towards OT goals, able to complete toileting at supervision level;  but continues to have cognitive deficits planning,attention, sequencing,mental flexibility, suboptimal search strategies, concrete thinking and the inability to multitask. Pt is VERY pleasant, excited to work with therapy. Today the therapist had "hidden" all the letters of her name in the hallway and Pt had to find all of them and assemble them into sequential order. Pt required mod cues for task completion as well as vc for safety throughout the session. She continues to require 24 hour supervision and post-acute therapy.   Follow Up Recommendations  SNF;Supervision/Assistance - 24 hour(ALF?)    Equipment Recommendations  3 in 1 bedside commode    Recommendations for Other Services      Precautions / Restrictions Precautions Precautions: Fall Precaution Comments: RN reports worse confusion and agitation at night, s/p 5pm Restrictions Weight Bearing Restrictions: No       Mobility Bed Mobility Overal bed mobility: Modified Independent                Transfers Overall transfer level: Needs assistance Equipment used: Rolling walker (2 wheeled) Transfers: Sit to/from Stand Sit to Stand: Supervision         General transfer comment: Supervision for safety    Balance Overall balance assessment: Needs assistance Sitting-balance support: No upper extremity supported;Feet supported Sitting balance-Leahy Scale:  Normal     Standing balance support: No upper extremity supported;Single extremity supported;During functional activity Standing balance-Leahy Scale: Fair Standing balance comment: supervision - GREATLY improved with RW for dynamic                High Level Balance Comments: able to gather letters taped in low and high positions - required cues for safety with RW           ADL either performed or assessed with clinical judgement   ADL Overall ADL's : Needs assistance/impaired     Grooming: Wash/dry hands;Brushing hair;Standing Grooming Details (indicate cue type and reason): stood at sink for grooming activities, required prompt to remember 3 activities she was "tasked" to do (that she chose)         Upper Body Dressing : Minimal assistance;Sitting Upper Body Dressing Details (indicate cue type and reason): to don hospital gown as back side cover     Toilet Transfer: Supervision/safety;RW   Toileting- Clothing Manipulation and Hygiene: Supervision/safety;Sit to/from stand Toileting - Clothing Manipulation Details (indicate cue type and reason): able to manage peri care in sitting, and paper scrubs sit<>stand     Functional mobility during ADLs: Supervision/safety;Rolling walker General ADL Comments: focused on compensatory memory strategies to optimize recall during tasks     Vision       Perception     Praxis      Cognition Arousal/Alertness: Awake/alert Behavior During Therapy: WFL for tasks assessed/performed Overall Cognitive Status: History of cognitive impairments - at baseline Area of Impairment: Attention;Memory;Following commands;Safety/judgement;Awareness;Problem solving                   Current Attention Level:  Selective Memory: Decreased short-term memory Following Commands: Follows one step commands consistently;Follows one step commands with increased time Safety/Judgement: Decreased awareness of safety;Decreased awareness of  deficits Awareness: Emergent Problem Solving: Requires verbal cues;Difficulty sequencing General Comments: challenging ST memory, problem solving and attention        Exercises     Shoulder Instructions       General Comments VSS    Pertinent Vitals/ Pain       Pain Assessment: No/denies pain Pain Intervention(s): Monitored during session  Home Living                                          Prior Functioning/Environment              Frequency  Min 1X/week        Progress Toward Goals  OT Goals(current goals can now be found in the care plan section)  Progress towards OT goals: Progressing toward goals  Acute Rehab OT Goals Patient Stated Goal: return to PLOF OT Goal Formulation: With patient Time For Goal Achievement: 03/13/19 Potential to Achieve Goals: Good  Plan Discharge plan remains appropriate;Frequency remains appropriate    Co-evaluation                 AM-PAC OT "6 Clicks" Daily Activity     Outcome Measure   Help from another person eating meals?: None Help from another person taking care of personal grooming?: A Little Help from another person toileting, which includes using toliet, bedpan, or urinal?: A Little Help from another person bathing (including washing, rinsing, drying)?: A Little Help from another person to put on and taking off regular upper body clothing?: None Help from another person to put on and taking off regular lower body clothing?: A Little 6 Click Score: 20    End of Session Equipment Utilized During Treatment: Rolling walker;Gait belt  OT Visit Diagnosis: Unsteadiness on feet (R26.81);Other abnormalities of gait and mobility (R26.89);Muscle weakness (generalized) (M62.81);Other symptoms and signs involving cognitive function   Activity Tolerance Patient tolerated treatment well   Patient Left with call bell/phone within reach;Other (comment)(seated EOB)   Nurse Communication Mobility  status        Time: 3500-9381 OT Time Calculation (min): 24 min  Charges: OT General Charges $OT Visit: 1 Visit OT Treatments $Self Care/Home Management : 8-22 mins $Therapeutic Activity: 8-22 mins  Nyoka Cowden OTR/L Acute Rehabilitation Services Pager: [phone] Office: [phone]   Evern Bio Tymeshia Awan 02/27/2019, 1:46 PM

## 2019-02-27 NOTE — Progress Notes (Signed)
PROGRESS NOTE  Marie Woods ZOX:[id number] DOB: April 22, 1955   PCP: Marcine Matar, MD  Patient is from: Home.  DOA: 12/13/2018 LOS: 76  Brief Narrative / Interim history: 64 year old female with past medical history of asthma, hypertension, alcohol abuse was brought to the hospital with confusion and altered mental status. She was found to have UTI; urine culture showed multiple species. MRI brain was suggestive of possible Wernicke's encephalopathy. Treated with thiamine. OT subsequently evaluated the patient and recommended SNF placement. Per OT evaluation on 02/08/2019, "patient engaged in pill box test to assess functional cognition. Patient failed the assessment, demonstrating poor planning, attention, sequencing, mental flexibility, suboptimal search strategies, concrete thinking and the inability to multitask. Patient had a total of13 errors, where more than 3 errors is considered a fail."    Subjective: No major events overnight or this morning.  No complaints.   Objective: Vitals:   02/26/19 0857 02/26/19 1828 02/26/19 2324 02/27/19 0940  BP: 117/75 113/82 112/78 108/75  Pulse: 83 77 83 75  Resp: 17 18 20 20   Temp: 98.4 F (36.9 C) 98.5 F (36.9 C) 98.4 F (36.9 C) 98.3 F (36.8 C)  TempSrc:  Oral  Oral  SpO2: 100% 97% 96% 97%  Weight:      Height:       No intake or output data in the 24 hours ending 02/27/19 1524 Filed Weights   12/15/18 0101 02/22/19 0359  Weight: 98.4 kg 67.1 kg    Examination:  GENERAL: No acute distress.  Appears well.  HEENT: MMM.  Vision and hearing grossly intact.  NECK: Supple.  No apparent JVD.  RESP:  No IWOB. Good air movement bilaterally. CVS:  RRR. Heart sounds normal.  ABD/GI/GU: Bowel sounds present. Soft. Non tender.  MSK/EXT:  Moves extremities. No apparent deformity. No edema.  SKIN: no apparent skin lesion or wound NEURO: Awake, alert and oriented fairly.  No apparent focal neuro deficit. PSYCH: Calm.  Limited  insight.  Procedures:  None  Assessment & Plan: Acute metabolic encephalopathy likely secondary to Wernicke's encephalopathy and Korsakoff dementia-improved. MRI of the brain findings favored sequelae of Wernicke encephalopathy. 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  UDS negative.  Completed IV thiamine.  She is now oriented x4 except date and year but no recollection of our discussion yesterday. OT recommended SNF due to limited functional cognition -Continue oral thiamine, folic acid, multivitamin.   -Will need outpatient follow-up with neurology after discharge.  Severe sepsis due to UTI: POA.  Resolved.  Completed antibiotic course.  Essential hypertension: Normotensive -Continue Norvasc  History of alcohol abuse: Outside withdrawal window. -Encourage quitting drinking. -Continue thiamine, folate, multivitamin. PRN ativan.  History of tobacco abuse -Encouraged cessation. -Continue nicotine patch to 14 mg. Will continue to taper  Generalized deconditioning and debility -Continue PT/OT-plan for discharge to SNF when bed is available  Hypokalemia: Resolved.   Nutrition Problem: Increased nutrient needs Etiology: acute illness  Signs/Symptoms: estimated needs  Interventions: Ensure Enlive (each supplement provides 350kcal and 20 grams of protein), MVI   DVT prophylaxis: Subcu Lovenox Code Status: Full code Family Communication: Patient and/or RN. Available if any question.  Disposition Plan: Patient is medically stable but has been determined to be unsafe for disposition home due to here functional cognition, and waiting on SNF placement.  Medicaid pending. Consultants: Dr. 02/24/19 02/04/2019, Neurology Dr 04/04/2019 02/15/19   Microbiology summarized: 11/10-COVID-19 negative. 11/10-urine culture with multiple species. 11/12-urine culture with insignificant growth.  Sch Meds:  Scheduled Meds: . amLODipine  5 mg Oral Daily  . enoxaparin (LOVENOX)  injection  40 mg Subcutaneous Q24H  . feeding supplement (ENSURE ENLIVE)  237 mL Oral BID BM  . folic acid  1 mg Oral Daily  . ketoconazole   Topical BID  . multivitamin with minerals  1 tablet Oral Daily  . nicotine  14 mg Transdermal Daily  . thiamine  100 mg Oral Daily   Continuous Infusions: PRN Meds:.acetaminophen **OR** acetaminophen, albuterol, alum & mag hydroxide-simeth, camphor-menthol, haloperidol lactate, hydrocerin, hydrOXYzine, loratadine, LORazepam, meclizine, ondansetron **OR** ondansetron (ZOFRAN) IV, phenol, traMADol, traZODone  Antimicrobials: Anti-infectives (From admission, onward)   Start     Dose/Rate Route Frequency Ordered Stop   12/14/18 0100  cefTRIAXone (ROCEPHIN) 1 g in sodium chloride 0.9 % 100 mL IVPB  Status:  Discontinued     1 g 200 mL/hr over 30 Minutes Intravenous Every 24 hours 12/14/18 0010 12/17/18 0948       I have personally reviewed the following labs and images: CBC: Recent Labs  Lab 02/25/19 0520  WBC 6.7  HGB 10.8*  HCT 32.1*  MCV 86.5  PLT 310   BMP &GFR Recent Labs  Lab 02/25/19 0520  NA 139  K 3.8  CL 103  CO2 27  GLUCOSE 88  BUN 14  CREATININE 0.58  CALCIUM 9.5  MG 2.1  PHOS 4.8*   Estimated Creatinine Clearance: 71.8 mL/min (by C-G formula based on SCr of 0.58 mg/dL). Liver & Pancreas: Recent Labs  Lab 02/25/19 0520  ALBUMIN 3.2*   No results for input(s): LIPASE, AMYLASE in the last 168 hours. No results for input(s): AMMONIA in the last 168 hours. Diabetic: No results for input(s): HGBA1C in the last 72 hours. No results for input(s): GLUCAP in the last 168 hours. Cardiac Enzymes: No results for input(s): CKTOTAL, CKMB, CKMBINDEX, TROPONINI in the last 168 hours. No results for input(s): PROBNP in the last 8760 hours. Coagulation Profile: No results for input(s): INR, PROTIME in the last 168 hours. Thyroid Function Tests: No results for input(s): TSH, T4TOTAL, FREET4, T3FREE, THYROIDAB in the last 72  hours. Lipid Profile: No results for input(s): CHOL, HDL, LDLCALC, TRIG, CHOLHDL, LDLDIRECT in the last 72 hours. Anemia Panel: No results for input(s): VITAMINB12, FOLATE, FERRITIN, TIBC, IRON, RETICCTPCT in the last 72 hours. Urine analysis:    Component Value Date/Time   COLORURINE AMBER (A) 12/13/2018 2218   APPEARANCEUR HAZY (A) 12/13/2018 2218   LABSPEC 1.027 12/13/2018 2218   PHURINE 5.0 12/13/2018 2218   GLUCOSEU NEGATIVE 12/13/2018 2218   HGBUR SMALL (A) 12/13/2018 2218   HGBUR small 06/28/2009 1504   BILIRUBINUR SMALL (A) 12/13/2018 2218   KETONESUR 5 (A) 12/13/2018 2218   PROTEINUR 30 (A) 12/13/2018 2218   UROBILINOGEN 1.0 09/28/2014 1426   NITRITE NEGATIVE 12/13/2018 2218   LEUKOCYTESUR TRACE (A) 12/13/2018 2218   Sepsis Labs: Invalid input(s): PROCALCITONIN, [place]  Microbiology: No results found for this or any previous visit (from the past 240 hour(s)).  Radiology Studies: No results found.    Journey Castonguay T. [place]  If 7PM-7AM, please contact night-coverage [email or web address] Password [facility] 02/27/2019, 3:24 PM

## 2019-02-28 NOTE — Progress Notes (Signed)
PROGRESS NOTE  Marie Woods:[id number] DOB: 1955/03/23   PCP: Ladell Pier, MD  Patient is from: Home.  DOA: 12/13/2018 LOS: 48  Brief Narrative / Interim history: 64 year old female with past medical history of asthma, hypertension, alcohol abuse was brought to the hospital with confusion and altered mental status. She was found to have UTI; urine culture showed multiple species. MRI brain was suggestive of possible Wernicke's encephalopathy. Treated with thiamine. OT subsequently evaluated the patient and recommended SNF placement. Per OT evaluation on 02/08/2019, "patient engaged in pill box test to assess functional cognition. Patient failed the assessment, demonstrating poor planning, attention, sequencing, mental flexibility, suboptimal search strategies, concrete thinking and the inability to multitask. Patient had a total of13 errors, where more than 3 errors is considered a fail."    Subjective: Has documented desaturation to 84% on room air about midnight.  No documented event report.  Saturating at 100% on room air this morning.  She denies chest pain, dyspnea, cough, GI or UTI symptoms.  Objective: Vitals:   02/27/19 0940 02/27/19 2305 02/28/19 0400 02/28/19 0800  BP: 108/75 (!) 94/55 110/80 104/68  Pulse: 75 (!) 55 75 77  Resp: 20 20  16   Temp: 98.3 F (36.8 C) 98 F (36.7 C) 98.1 F (36.7 C) 98.1 F (36.7 C)  TempSrc: Oral  Oral   SpO2: 97% (!) 84% 100% 99%  Weight:      Height:        Intake/Output Summary (Last 24 hours) at 02/28/2019 1158 Last data filed at 02/27/2019 2150 Gross per 24 hour  Intake 240 ml  Output --  Net 240 ml   Filed Weights   12/15/18 0101 02/22/19 0359  Weight: 98.4 kg 67.1 kg    Examination:  GENERAL: No apparent distress.  Nontoxic.  Resting comfortably. HEENT: MMM.  Vision and hearing grossly intact.  NECK: Supple.  No apparent JVD.  RESP:  No IWOB. Good air movement bilaterally.  No crackles or rales. CVS:  RRR.  Heart sounds normal.  ABD/GI/GU: Bowel sounds present. Soft. Non tender.  MSK/EXT:  Moves extremities. No apparent deformity. No edema.  SKIN: no apparent skin lesion or wound NEURO: Awake, alert and oriented fairly.  No apparent focal neuro deficit. PSYCH: Calm. Normal affect.  Procedures:  None  Assessment & Plan: Acute metabolic encephalopathy likely secondary to Wernicke's encephalopathy and Korsakoff dementia-improved. MRI of the brain findings favored sequelae of Wernicke encephalopathy. 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  UDS negative.  Completed IV thiamine.  She is now oriented x4 except date and year but no recollection of our discussion yesterday. OT recommended SNF due to limited functional cognition -Continue oral thiamine, folic acid, multivitamin.   -Will need outpatient follow-up with neurology after discharge.  Severe sepsis due to UTI: POA.  Resolved.  Completed antibiotic course.  Essential hypertension: Normotensive -Continue Norvasc  History of alcohol abuse: Outside withdrawal window. -Encourage quitting drinking. -Continue thiamine, folate, multivitamin. PRN ativan.  History of tobacco abuse -Encouraged cessation. -Continue nicotine patch to 14 mg. Will continue to taper  Generalized deconditioning and debility -Continue PT/OT-plan for discharge to SNF when bed is available  Hypokalemia: Resolved.   Nutrition Problem: Increased nutrient needs Etiology: acute illness  Signs/Symptoms: estimated needs  Interventions: Ensure Enlive (each supplement provides 350kcal and 20 grams of protein), MVI   DVT prophylaxis: Subcu Lovenox Code Status: Full code Family Communication: Patient and/or RN. Available if any question.  Disposition Plan: Patient is medically  stable but has been determined to be unsafe for disposition home due to here functional cognition, and waiting on SNF placement.  Medicaid pending. Consultants: Dr. Jannifer Franklin  02/04/2019, Neurology Dr Otelia Limes 02/15/19   Microbiology summarized: 11/10-COVID-19 negative. 11/10-urine culture with multiple species. 11/12-urine culture with insignificant growth.  Sch Meds:  Scheduled Meds: . amLODipine  5 mg Oral Daily  . enoxaparin (LOVENOX) injection  40 mg Subcutaneous Q24H  . feeding supplement (ENSURE ENLIVE)  237 mL Oral BID BM  . folic acid  1 mg Oral Daily  . ketoconazole   Topical BID  . multivitamin with minerals  1 tablet Oral Daily  . nicotine  14 mg Transdermal Daily  . thiamine  100 mg Oral Daily   Continuous Infusions: PRN Meds:.acetaminophen **OR** acetaminophen, albuterol, alum & mag hydroxide-simeth, camphor-menthol, haloperidol lactate, hydrocerin, hydrOXYzine, loratadine, LORazepam, meclizine, ondansetron **OR** ondansetron (ZOFRAN) IV, phenol, traMADol, traZODone  Antimicrobials: Anti-infectives (From admission, onward)   Start     Dose/Rate Route Frequency Ordered Stop   12/14/18 0100  cefTRIAXone (ROCEPHIN) 1 g in sodium chloride 0.9 % 100 mL IVPB  Status:  Discontinued     1 g 200 mL/hr over 30 Minutes Intravenous Every 24 hours 12/14/18 0010 12/17/18 0948       I have personally reviewed the following labs and images: CBC: Recent Labs  Lab 02/25/19 0520  WBC 6.7  HGB 10.8*  HCT 32.1*  MCV 86.5  PLT 310   BMP &GFR Recent Labs  Lab 02/25/19 0520  NA 139  K 3.8  CL 103  CO2 27  GLUCOSE 88  BUN 14  CREATININE 0.58  CALCIUM 9.5  MG 2.1  PHOS 4.8*   Estimated Creatinine Clearance: 71.8 mL/min (by C-G formula based on SCr of 0.58 mg/dL). Liver & Pancreas: Recent Labs  Lab 02/25/19 0520  ALBUMIN 3.2*   No results for input(s): LIPASE, AMYLASE in the last 168 hours. No results for input(s): AMMONIA in the last 168 hours. Diabetic: No results for input(s): HGBA1C in the last 72 hours. No results for input(s): GLUCAP in the last 168 hours. Cardiac Enzymes: No results for input(s): CKTOTAL, CKMB, CKMBINDEX,  TROPONINI in the last 168 hours. No results for input(s): PROBNP in the last 8760 hours. Coagulation Profile: No results for input(s): INR, PROTIME in the last 168 hours. Thyroid Function Tests: No results for input(s): TSH, T4TOTAL, FREET4, T3FREE, THYROIDAB in the last 72 hours. Lipid Profile: No results for input(s): CHOL, HDL, LDLCALC, TRIG, CHOLHDL, LDLDIRECT in the last 72 hours. Anemia Panel: No results for input(s): VITAMINB12, FOLATE, FERRITIN, TIBC, IRON, RETICCTPCT in the last 72 hours. Urine analysis:    Component Value Date/Time   COLORURINE AMBER (A) 12/13/2018 2218   APPEARANCEUR HAZY (A) 12/13/2018 2218   LABSPEC 1.027 12/13/2018 2218   PHURINE 5.0 12/13/2018 2218   GLUCOSEU NEGATIVE 12/13/2018 2218   HGBUR SMALL (A) 12/13/2018 2218   HGBUR small 06/28/2009 1504   BILIRUBINUR SMALL (A) 12/13/2018 2218   KETONESUR 5 (A) 12/13/2018 2218   PROTEINUR 30 (A) 12/13/2018 2218   UROBILINOGEN 1.0 09/28/2014 1426   NITRITE NEGATIVE 12/13/2018 2218   LEUKOCYTESUR TRACE (A) 12/13/2018 2218   Sepsis Labs: Invalid input(s): PROCALCITONIN, LACTICIDVEN  Microbiology: No results found for this or any previous visit (from the past 240 hour(s)).  Radiology Studies: No results found.   Tavoris Brisk T. Kelii Chittum [facility]  If 7PM-7AM, please contact night-coverage [email or web address] Password [facility] 02/28/2019, 11:58 AM

## 2019-03-01 MED ORDER — GUAIFENESIN-DM 100-10 MG/5ML PO SYRP
5.0000 mL | ORAL_SOLUTION | ORAL | Status: DC | PRN
  Administered 2019-03-01 – 2019-05-16 (×63): 5 mL via ORAL
  Filled 2019-03-01 (×66): qty 5

## 2019-03-01 NOTE — TOC Progression Note (Signed)
Transition of Care [facility]) - Progression Note    Patient Details  Name: Marie Woods MRN: [id number] Date of Birth: September 07, 1955  Transition of Care [facility]) CM/SW Contact  Mearl Latin, LCSW Phone Number: 03/01/2019, 12:33 PM  Clinical Narrative:    CSW contacted [facility]'s administrator to see if they would be willing to review patient on a Letter of Guarantee. Also faxed referral to additional ALF's again.    Expected Discharge Plan: Skilled Nursing Facility Barriers to Discharge: No SNF bed, SNF Pending payor source - LOG  Expected Discharge Plan and Services Expected Discharge Plan: Skilled Nursing Facility In-house Referral: Clinical Social Work Discharge Planning Services: NA Post Acute Care Choice: Skilled Nursing Facility Living arrangements for the past 2 months: Apartment                 DME Arranged: N/A DME Agency: NA                   Social Determinants of Health (SDOH) Interventions    Readmission Risk Interventions No flowsheet data found.

## 2019-03-01 NOTE — TOC Progression Note (Signed)
Transition of Care [facility]) - Progression Note    Patient Details  Name: Marie Woods MRN: [id number] Date of Birth: 11-20-1955  Transition of Care [facility]) CM/SW Contact  Ranelle Oyster, Student-Social Work Phone Number: 03/01/2019, 3:12 PM  Clinical Narrative:    MSW contacted ALFs to provide placement for pt, even though pt is still pending Medicaid. MSW contacted these ALF's that do accept Medicaid ([facility] of [address], [address], [address], and [place]. MSW faxed out referrals to these agencies. The following ALF's do not accept Medicaid: [facility], [address], [address] (due to [employer]), [facility], [address], [address], [address], [place], [address] (But would look at the referral), [facility], [facility], and [facility]. MSW will continue to follow up.   Expected Discharge Plan: Skilled Nursing Facility Barriers to Discharge: No SNF bed, SNF Pending payor source - LOG  Expected Discharge Plan and Services Expected Discharge Plan: Skilled Nursing Facility In-house Referral: Clinical Social Work Discharge Planning Services: NA Post Acute Care Choice: Skilled Nursing Facility Living arrangements for the past 2 months: Apartment                 DME Arranged: N/A DME Agency: NA                   Social Determinants of Health (SDOH) Interventions    Readmission Risk Interventions No flowsheet data found.

## 2019-03-01 NOTE — Progress Notes (Signed)
[name], female   DOB: 07-Feb-1955, 64 y.o.   MRN: [id number]  PROGRESS NOTE    Marie Woods  ELF:[id number] DOB: [birth year] DOA: 12/13/2018 PCP: Marcine Matar, MD   Brief Narrative:  64 year old female with history of asthma, hypertension, alcohol abuse was brought to the hospital confused and altered.  She was found to have UTI; urine culture showed multiple species.  MRI brain was suggestive of possible Wernicke's encephalopathy.  Treated with thiamine.  PT/OT recommended SNF placement.  Assessment & Plan:   Severe sepsis: Present on admission UTI -Urine culture showed multiple species.  Repeat culture showed less than 10,000 colonies.  Was initially treated with IV Rocephin and fluids.  Subsequently antibiotics have been discontinued. -Sepsis has resolved.  Chest x-ray was negative for infection. -Currently hemodynamically stable  Acute metabolic encephalopathy Possible Wernicke's encephalopathy -CT of the head showed mild atrophic changes without acute intracranial abnormality -MRI of the brain: Symmetric diffusion weighted and T2/FLAIR hyperintense signal abnormality within the medial thalami.  Findings favor sequelae of Wernicke's encephalopathy There was question of patient having alcohol abuse however alcohol level less than 10.  Urine drug screen negative -Completed IV thiamine.  Currently on oral thiamine now.  -Continue folic acid, multivitamin. -Outpatient follow-up with neurology. -Mental status still fluctuating.  Suspect underlying dementia with memory issues.    Essential hypertension -Continue Norvasc.  History of alcohol abuse -Treated with CIWA protocol.  Continue folate, thiamine and multivitamin.   History of tobacco abuse -Continue nicotine patch  Generalized deconditioning -PT/OT recommending SNF.  Social worker following. -Feel that patient does need supervision as she appears to have short-term memory impairment.  We discuss the  reason for her hospitalization on a daily basis but she does not remember the next day.  DVT prophylaxis: Lovenox Code Status: Full Family Communication: None at bedside Disposition Plan: SNF once bed is available.  Currently medically stable for discharge  Consultants: Neurology/psychiatry  Procedures: None  Antimicrobials:  Anti-infectives (From admission, onward)   Start     Dose/Rate Route Frequency Ordered Stop   12/14/18 0100  cefTRIAXone (ROCEPHIN) 1 g in sodium chloride 0.9 % 100 mL IVPB  Status:  Discontinued     1 g 200 mL/hr over 30 Minutes Intravenous Every 24 hours 12/14/18 0010 12/17/18 0948       Subjective: Patient seen and examined at bedside.  Poor historian.  No fever, vomiting, worsening agitation reported by nursing staff.  Objective: Vitals:   02/28/19 1650 02/28/19 2255 03/01/19 0800 03/01/19 0915  BP: 109/72 98/64 119/83 111/80  Pulse: 92 97 81 81  Resp: 16 20 16 18   Temp: 98.6 F (37 C) 98.4 F (36.9 C) 98.7 F (37.1 C) 98.3 F (36.8 C)  TempSrc:   Oral   SpO2: 96% 99% 100% 92%  Weight:      Height:        Intake/Output Summary (Last 24 hours) at 03/01/2019 1130 Last data filed at 02/28/2019 2100 Gross per 24 hour  Intake 240 ml  Output --  Net 240 ml   Filed Weights   12/15/18 0101 02/22/19 0359  Weight: 98.4 kg 67.1 kg    Examination:  General exam: No acute distress.  Looks chronically ill.  Looks older than stated age.  Awake, does not answer much questions. Respiratory system: Bilateral decreased breath sounds at bases with some crackles.  No wheezing.   Cardiovascular system: Rate controlled, S1-S2 heard Gastrointestinal system: Abdomen is nondistended, soft and  nontender. Normal bowel sounds heard. Extremities: No clubbing or cyanosis.  Trace lower extremity edema.  Data Reviewed: I have personally reviewed following labs and imaging studies  CBC: Recent Labs  Lab 02/25/19 0520  WBC 6.7  HGB 10.8*  HCT 32.1*  MCV 86.5   PLT 701   Basic Metabolic Panel: Recent Labs  Lab 02/25/19 0520  NA 139  K 3.8  CL 103  CO2 27  GLUCOSE 88  BUN 14  CREATININE 0.58  CALCIUM 9.5  MG 2.1  PHOS 4.8*   GFR: Estimated Creatinine Clearance: 71.8 mL/min (by C-G formula based on SCr of 0.58 mg/dL). Liver Function Tests: Recent Labs  Lab 02/25/19 0520  ALBUMIN 3.2*   No results for input(s): LIPASE, AMYLASE in the last 168 hours. No results for input(s): AMMONIA in the last 168 hours. Coagulation Profile: No results for input(s): INR, PROTIME in the last 168 hours. Cardiac Enzymes: No results for input(s): CKTOTAL, CKMB, CKMBINDEX, TROPONINI in the last 168 hours. BNP (last 3 results) No results for input(s): PROBNP in the last 8760 hours. HbA1C: No results for input(s): HGBA1C in the last 72 hours. CBG: No results for input(s): GLUCAP in the last 168 hours. Lipid Profile: No results for input(s): CHOL, HDL, LDLCALC, TRIG, CHOLHDL, LDLDIRECT in the last 72 hours. Thyroid Function Tests: No results for input(s): TSH, T4TOTAL, FREET4, T3FREE, THYROIDAB in the last 72 hours. Anemia Panel: No results for input(s): VITAMINB12, FOLATE, FERRITIN, TIBC, IRON, RETICCTPCT in the last 72 hours. Sepsis Labs: No results for input(s): PROCALCITON, LATICACIDVEN in the last 168 hours.  No results found for this or any previous visit (from the past 240 hour(s)).       Radiology Studies: No results found.      Scheduled Meds: . amLODipine  5 mg Oral Daily  . enoxaparin (LOVENOX) injection  40 mg Subcutaneous Q24H  . feeding supplement (ENSURE ENLIVE)  237 mL Oral BID BM  . folic acid  1 mg Oral Daily  . ketoconazole   Topical BID  . multivitamin with minerals  1 tablet Oral Daily  . nicotine  14 mg Transdermal Daily  . thiamine  100 mg Oral Daily   Continuous Infusions:         Aline August, MD [facility] 03/01/2019, 11:30 AM

## 2019-03-02 NOTE — Progress Notes (Signed)
[name], female   DOB: 06-06-1955, 64 y.o.   MRN: [id number]  PROGRESS NOTE    CANDEE HOON  OEV:[id number] DOB: 12-30-1955 DOA: 12/13/2018 PCP: Ladell Pier, MD   Brief Narrative:  64 year old female with history of asthma, hypertension, alcohol abuse was brought to the hospital confused and altered.  She was found to have UTI; urine culture showed multiple species.  MRI brain was suggestive of possible Wernicke's encephalopathy.  Treated with thiamine.  PT/OT recommended SNF placement.  Assessment & Plan:   Severe sepsis: Present on admission UTI -Urine culture showed multiple species.  Repeat culture showed less than 10,000 colonies.  Was initially treated with IV Rocephin and fluids.  Subsequently antibiotics have been discontinued. -Sepsis has resolved.  Chest x-ray was negative for infection. -Currently hemodynamically stable  Acute metabolic encephalopathy Possible Wernicke's encephalopathy -CT of the head showed mild atrophic changes without acute intracranial abnormality -MRI of the brain: Symmetric diffusion weighted and T2/FLAIR hyperintense signal abnormality within the medial thalami.  Findings favor sequelae of Wernicke's encephalopathy There was question of patient having alcohol abuse however alcohol level less than 10.  Urine drug screen negative -Completed IV thiamine.  Currently on oral thiamine now.  -Continue folic acid, multivitamin. -Outpatient follow-up with neurology. -Mental status still fluctuating.  Suspect underlying dementia with memory issues.    Essential hypertension -Continue Norvasc.  History of alcohol abuse -Treated with CIWA protocol.  Continue folate, thiamine and multivitamin.   History of tobacco abuse -Continue nicotine patch  Generalized deconditioning -PT/OT recommending SNF.  Social worker following. -Feel that patient does need supervision as she appears to have short-term memory impairment.  We discuss the  reason for her hospitalization on a daily basis but she does not remember the next day.  DVT prophylaxis: Lovenox Code Status: Full Family Communication: None at bedside Disposition Plan: SNF once bed is available.  Currently medically stable for discharge  Consultants: Neurology/psychiatry  Procedures: None  Antimicrobials:  Anti-infectives (From admission, onward)   Start     Dose/Rate Route Frequency Ordered Stop   12/14/18 0100  cefTRIAXone (ROCEPHIN) 1 g in sodium chloride 0.9 % 100 mL IVPB  Status:  Discontinued     1 g 200 mL/hr over 30 Minutes Intravenous Every 24 hours 12/14/18 0010 12/17/18 0948       Subjective: Patient seen and examined at bedside.  Poor historian.  Sleepy, wakes up slightly, does not answer much questions.   Objective: Vitals:   03/01/19 0800 03/01/19 0915 03/01/19 1724 03/01/19 2230  BP: 119/83 111/80 109/74 121/78  Pulse: 81 81 85 78  Resp: 16 18  17   Temp: 98.7 F (37.1 C) 98.3 F (36.8 C) 98.9 F (37.2 C) 98.5 F (36.9 C)  TempSrc: Oral   Oral  SpO2: 100% 92% 100% 99%  Weight:      Height:        Intake/Output Summary (Last 24 hours) at 03/02/2019 0726 Last data filed at 03/01/2019 1300 Gross per 24 hour  Intake 480 ml  Output --  Net 480 ml   Filed Weights   12/15/18 0101 02/22/19 0359  Weight: 98.4 kg 67.1 kg    Examination:  General exam: No distress.  Looks chronically ill.  Looks older than stated age.  Sleepy, wakes up only slightly. Respiratory system: Bilateral decreased breath sounds at bases with some crackles.  No wheezing.   Cardiovascular system: Rate controlled, S1-S2 heard Gastrointestinal system: Abdomen is nondistended, soft and nontender.  Normal bowel sounds heard. Extremities: No clubbing or cyanosis.  Trace lower extremity edema.  Data Reviewed: I have personally reviewed following labs and imaging studies  CBC: Recent Labs  Lab 02/25/19 0520  WBC 6.7  HGB 10.8*  HCT 32.1*  MCV 86.5  PLT 310    Basic Metabolic Panel: Recent Labs  Lab 02/25/19 0520  NA 139  K 3.8  CL 103  CO2 27  GLUCOSE 88  BUN 14  CREATININE 0.58  CALCIUM 9.5  MG 2.1  PHOS 4.8*   GFR: Estimated Creatinine Clearance: 71.8 mL/min (by C-G formula based on SCr of 0.58 mg/dL). Liver Function Tests: Recent Labs  Lab 02/25/19 0520  ALBUMIN 3.2*   No results for input(s): LIPASE, AMYLASE in the last 168 hours. No results for input(s): AMMONIA in the last 168 hours. Coagulation Profile: No results for input(s): INR, PROTIME in the last 168 hours. Cardiac Enzymes: No results for input(s): CKTOTAL, CKMB, CKMBINDEX, TROPONINI in the last 168 hours. BNP (last 3 results) No results for input(s): PROBNP in the last 8760 hours. HbA1C: No results for input(s): HGBA1C in the last 72 hours. CBG: No results for input(s): GLUCAP in the last 168 hours. Lipid Profile: No results for input(s): CHOL, HDL, LDLCALC, TRIG, CHOLHDL, LDLDIRECT in the last 72 hours. Thyroid Function Tests: No results for input(s): TSH, T4TOTAL, FREET4, T3FREE, THYROIDAB in the last 72 hours. Anemia Panel: No results for input(s): VITAMINB12, FOLATE, FERRITIN, TIBC, IRON, RETICCTPCT in the last 72 hours. Sepsis Labs: No results for input(s): PROCALCITON, LATICACIDVEN in the last 168 hours.  No results found for this or any previous visit (from the past 240 hour(s)).       Radiology Studies: No results found.      Scheduled Meds:  amLODipine  5 mg Oral Daily   enoxaparin (LOVENOX) injection  40 mg Subcutaneous Q24H   feeding supplement (ENSURE ENLIVE)  237 mL Oral BID BM   folic acid  1 mg Oral Daily   ketoconazole   Topical BID   multivitamin with minerals  1 tablet Oral Daily   nicotine  14 mg Transdermal Daily   thiamine  100 mg Oral Daily   Continuous Infusions:         Glade Lloyd, MD [facility] 03/02/2019, 7:26 AM

## 2019-03-03 DIAGNOSIS — D649 Anemia, unspecified: Secondary | ICD-10-CM

## 2019-03-03 DIAGNOSIS — N390 Urinary tract infection, site not specified: Secondary | ICD-10-CM

## 2019-03-03 DIAGNOSIS — Z1389 Encounter for screening for other disorder: Secondary | ICD-10-CM

## 2019-03-03 DIAGNOSIS — A419 Sepsis, unspecified organism: Secondary | ICD-10-CM

## 2019-03-03 LAB — CBC WITH DIFFERENTIAL/PLATELET
Abs Immature Granulocytes: 0.02 10*3/uL (ref 0.00–0.07)
Basophils Absolute: 0 10*3/uL (ref 0.0–0.1)
Basophils Relative: 1 %
Eosinophils Absolute: 0.2 10*3/uL (ref 0.0–0.5)
Eosinophils Relative: 3 %
HCT: 39.7 % (ref 36.0–46.0)
Hemoglobin: 13 g/dL (ref 12.0–15.0)
Immature Granulocytes: 0 %
Lymphocytes Relative: 38 %
Lymphs Abs: 2.4 10*3/uL (ref 0.7–4.0)
MCH: 29.1 pg (ref 26.0–34.0)
MCHC: 32.7 g/dL (ref 30.0–36.0)
MCV: 88.8 fL (ref 80.0–100.0)
Monocytes Absolute: 0.7 10*3/uL (ref 0.1–1.0)
Monocytes Relative: 11 %
Neutro Abs: 3 10*3/uL (ref 1.7–7.7)
Neutrophils Relative %: 47 %
Platelets: 358 10*3/uL (ref 150–400)
RBC: 4.47 MIL/uL (ref 3.87–5.11)
RDW: 15.2 % (ref 11.5–15.5)
WBC: 6.4 10*3/uL (ref 4.0–10.5)
nRBC: 0 % (ref 0.0–0.2)

## 2019-03-03 LAB — COMPREHENSIVE METABOLIC PANEL
ALT: 14 U/L (ref 0–44)
AST: 18 U/L (ref 15–41)
Albumin: 3.6 g/dL (ref 3.5–5.0)
Alkaline Phosphatase: 39 U/L (ref 38–126)
Anion gap: 11 (ref 5–15)
BUN: 11 mg/dL (ref 8–23)
CO2: 23 mmol/L (ref 22–32)
Calcium: 9.9 mg/dL (ref 8.9–10.3)
Chloride: 108 mmol/L (ref 98–111)
Creatinine, Ser: 0.57 mg/dL (ref 0.44–1.00)
GFR calc Af Amer: >60 mL/min (ref >60)
GFR calc non Af Amer: >60 mL/min (ref >60)
Glucose, Bld: 89 mg/dL (ref 70–99)
Potassium: 4 mmol/L (ref 3.5–5.1)
Sodium: 142 mmol/L (ref 135–145)
Total Bilirubin: 0.6 mg/dL (ref 0.3–1.2)
Total Protein: 7.1 g/dL (ref 6.5–8.1)

## 2019-03-03 LAB — MAGNESIUM: Magnesium: 2 mg/dL (ref 1.7–2.4)

## 2019-03-03 LAB — PHOSPHORUS: Phosphorus: 4.3 mg/dL (ref 2.5–4.6)

## 2019-03-03 NOTE — Progress Notes (Signed)
PROGRESS NOTE    Marie Woods  VPX:[id number] DOB: 07-26-1955 DOA: 12/13/2018 PCP: Marcine Matar, MD  Brief Narrative: The patient is a 64 year old African-American female with a past medical history significant for but not limited to asthma, hypertension, alcohol abuse who originally presented on 12/13/2018 for confusion and altered mental status.  She was found to have a UTI with urine cultures showing multiple species.  MRI of the brain was done and was suggestive of possible Wernicke's encephalopathy. She was treated with thiamine.  She subsequently improved and PT OT recommended SNF placement.  She is unsafe to discharge home given her mental status fluctuating with underlying dementia and suspected difficulty with memory issues.  She will need to be discharged to skilled nursing facility for safe disposition however currently bed is unavailable per social worker and they are in the process of attempting to place the patient.  Assessment & Plan:   Principal Problem:   AMS (altered mental status) Active Problems:   History of alcohol use   TOBACCO ABUSE   Essential hypertension   Confusion   Acute metabolic encephalopathy   Agitation   Acute lower UTI   Sepsis (HCC)  Severe sepsis 2/2 UTI: Present on admission UTI -Urine culture showed multiple species.  Repeat culture showed less than 10,000 colonies.   -Was initially treated with IV Rocephin and fluids.   -Subsequently antibiotics have been discontinued. -Sepsis physiology has resolved and she is hemodynamically stable  -Chest x-ray was negative for infection. -Currently hemodynamically stable  Acute metabolic encephalopathy Possible Wernicke's encephalopathy -CT of the head showed mild atrophic changes without acute intracranial abnormality -MRI of the brain: Symmetric diffusion weighted and T2/FLAIR hyperintense signal abnormality within the medial thalami. Findings favor sequelae of Wernicke's encephalopathy  -There was question of patient having alcohol abuse however alcohol level less than 10.  -Urine drug screen negative -Completed IV thiamine.  Currently on oral thiamine now.  -Continue folic acid, multivitamin. -Outpatient follow-up with Neurology. -Continue with haloperidol lactate 5 mg IM every 6 hours as needed for Agitation and with lorazepam 1 mg IM every 6 hours as needed for extreme agitation not resolved by the Haldol -Continue with hydroxyzine 25 mg p.o. twice daily as needed for anxiety -Mental status still fluctuating.  Suspect underlying dementia with memory issues.     Essential Hypertension -Continue Amlodipine 5 mg po Daily .  History of Alcohol Abuse -Treated with CIWA protocol.   -Continue folate, thiamine and multivitamin.  -Continue with hydroxyzine as above -Currently not withdrawing   History of Tobacco Abuse -Continue nicotine patch 14 mg transdermal patch every 24 hours  Generalized Weakness and Deconditioning -PT/OT recommending SNF.  Social worker following. -Feel that patient does need supervision as she appears to have short-term memory impairment.   Normocytic Anemia -Patient's hemoglobin/hematocrit is now 13.0/39.7 -Continue monitor and trend CBC intermittently and follow for signs and symptoms of bleeding  DVT prophylaxis: Enoxaparin 40 mg subcu every 24 Code Status: FULL CODE  Family Communication: No family present at bedside  Disposition Plan: SNF when bed is available; Currently medically stable for D/C But unable to find a SNF  Consultants:   Neurology  Psychiatry   Procedures: None   Antimicrobials:  Anti-infectives (From admission, onward)   Start     Dose/Rate Route Frequency Ordered Stop   12/14/18 0100  cefTRIAXone (ROCEPHIN) 1 g in sodium chloride 0.9 % 100 mL IVPB  Status:  Discontinued     1 g 200 mL/hr over  30 Minutes Intravenous Every 24 hours 12/14/18 0010 12/17/18 0948     Subjective: Seen And examined at  bedside and she was sleeping and awoken from sleep.  Had no complaints.  No nausea or vomiting.  Denies any chest pain, lightheadedness or dizziness.  No other concerns or complaints at this time.  Objective: Vitals:   03/02/19 0744 03/02/19 1610 03/02/19 2313 03/03/19 0829  BP: 104/74 105/71 114/74 106/70  Pulse: 78 82 78 71  Resp: 17 17 18 18   Temp: 98.6 F (37 C) 98.3 F (36.8 C) 98.4 F (36.9 C) 98.2 F (36.8 C)  TempSrc: Oral Oral Oral Oral  SpO2: 100% 99% 98% 98%  Weight:      Height:        Intake/Output Summary (Last 24 hours) at 03/03/2019 1257 Last data filed at 03/02/2019 1300 Gross per 24 hour  Intake 300 ml  Output -  Net 300 ml   Filed Weights   12/15/18 0101 02/22/19 0359  Weight: 98.4 kg 67.1 kg   Examination: Physical Exam:  Constitutional: WN/WD African-American female currently in NAD and appears calm and comfortable and was sleeping but awoken from sleep Eyes: Lids and conjunctivae normal, sclerae anicteric  ENMT: External Ears, Nose appear normal. Grossly normal hearing.  Neck: Appears normal, supple, no cervical masses, normal ROM, no appreciable thyromegaly; no JVD Respiratory: Diminished to auscultation bilaterally, no wheezing, rales, rhonchi or crackles. Normal respiratory effort and patient is not tachypenic. No accessory muscle use. Unlabored breathing  Cardiovascular: RRR, no murmurs / rubs / gallops. S1 and S2 auscultated.  Trace extremity edema.  Abdomen: Soft, non-tender, non-distended. Bowel sounds positive x4.  GU: Deferred. Musculoskeletal: No clubbing / cyanosis of digits/nails. No joint deformity upper and lower extremities.  Skin: No rashes, lesions, ulcers on a limited skin evaluation. No induration; Warm and dry.  Neurologic: CN 2-12 grossly intact with no focal deficits. Romberg sign and cerebellar reflexes not assessed.  Psychiatric: Normal judgment and insight.  Was a little somnolent and drowsy but awoken from sleep and she is now  alert.  Normal mood and appropriate affect.   Data Reviewed: I have personally reviewed following labs and imaging studies  CBC: Recent Labs  Lab 02/25/19 0520 03/03/19 1110  WBC 6.7 6.4  NEUTROABS  --  3.0  HGB 10.8* 13.0  HCT 32.1* 39.7  MCV 86.5 88.8  PLT 310 161   Basic Metabolic Panel: Recent Labs  Lab 02/25/19 0520 03/03/19 1110  NA 139 142  K 3.8 4.0  CL 103 108  CO2 27 23  GLUCOSE 88 89  BUN 14 11  CREATININE 0.58 0.57  CALCIUM 9.5 9.9  MG 2.1 2.0  PHOS 4.8* 4.3   GFR: Estimated Creatinine Clearance: 71.8 mL/min (by C-G formula based on SCr of 0.57 mg/dL). Liver Function Tests: Recent Labs  Lab 02/25/19 0520 03/03/19 1110  AST  --  18  ALT  --  14  ALKPHOS  --  39  BILITOT  --  0.6  PROT  --  7.1  ALBUMIN 3.2* 3.6   No results for input(s): LIPASE, AMYLASE in the last 168 hours. No results for input(s): AMMONIA in the last 168 hours. Coagulation Profile: No results for input(s): INR, PROTIME in the last 168 hours. Cardiac Enzymes: No results for input(s): CKTOTAL, CKMB, CKMBINDEX, TROPONINI in the last 168 hours. BNP (last 3 results) No results for input(s): PROBNP in the last 8760 hours. HbA1C: No results for input(s): HGBA1C in the  last 72 hours. CBG: No results for input(s): GLUCAP in the last 168 hours. Lipid Profile: No results for input(s): CHOL, HDL, LDLCALC, TRIG, CHOLHDL, LDLDIRECT in the last 72 hours. Thyroid Function Tests: No results for input(s): TSH, T4TOTAL, FREET4, T3FREE, THYROIDAB in the last 72 hours. Anemia Panel: No results for input(s): VITAMINB12, FOLATE, FERRITIN, TIBC, IRON, RETICCTPCT in the last 72 hours. Sepsis Labs: No results for input(s): PROCALCITON, LATICACIDVEN in the last 168 hours.  No results found for this or any previous visit (from the past 240 hour(s)).   RN Pressure Injury Documentation:     Estimated body mass index is 23.18 kg/m as calculated from the following:   Height as of this  encounter: 5\' 7"  (1.702 m).   Weight as of this encounter: 67.1 kg.  Malnutrition Type:  Nutrition Problem: Increased nutrient needs Etiology: acute illness   Malnutrition Characteristics:  Signs/Symptoms: estimated needs   Nutrition Interventions:  Interventions: Ensure Enlive (each supplement provides 350kcal and 20 grams of protein), MVI   Radiology Studies: No results found.  Scheduled Meds: . amLODipine  5 mg Oral Daily  . enoxaparin (LOVENOX) injection  40 mg Subcutaneous Q24H  . feeding supplement (ENSURE ENLIVE)  237 mL Oral BID BM  . folic acid  1 mg Oral Daily  . ketoconazole   Topical BID  . multivitamin with minerals  1 tablet Oral Daily  . nicotine  14 mg Transdermal Daily  . thiamine  100 mg Oral Daily    LOS: 80 days   , DO [facility] PAGER is on AMION  If 7PM-7AM, please contact night-coverage [email or web address]

## 2019-03-04 NOTE — Progress Notes (Signed)
PROGRESS NOTE    Marie Woods  FUX:[id number] DOB: May 20, 1955 DOA: 12/13/2018 PCP: Marcine Matar, MD  Brief Narrative: The patient is a 64 year old African-American female with a past medical history significant for but not limited to asthma, hypertension, alcohol abuse who originally presented on 12/13/2018 for confusion and altered mental status.  She was found to have a UTI with urine cultures showing multiple species.  MRI of the brain was done and was suggestive of possible Wernicke's encephalopathy. She was treated with thiamine.  She subsequently improved and PT OT recommended SNF placement.  She is unsafe to discharge home given her mental status fluctuating with underlying dementia and suspected difficulty with memory issues.  She will need to be discharged to skilled nursing facility for safe disposition however currently bed is unavailable per social worker and they are in the process of attempting to place the patient.  Assessment & Plan:   Principal Problem:   AMS (altered mental status) Active Problems:   History of alcohol use   TOBACCO ABUSE   Essential hypertension   Confusion   Acute metabolic encephalopathy   Agitation   Acute lower UTI   Sepsis (HCC)   Sepsis secondary to UTI (HCC)   Normocytic anemia  Severe sepsis 2/2 UTI: Present on admission UTI -Urine culture showed multiple species.  Repeat culture showed less than 10,000 colonies.   -Was initially treated with IV Rocephin and fluids.   -Subsequently antibiotics have been discontinued. -Sepsis physiology has resolved and she is hemodynamically stable  -Chest x-ray was negative for infection. -Currently hemodynamically stable  Acute metabolic encephalopathy Possible Wernicke's encephalopathy -CT of the head showed mild atrophic changes without acute intracranial abnormality -MRI of the brain: Symmetric diffusion weighted and T2/FLAIR hyperintense signal abnormality within the medial thalami.  Findings favor sequelae of Wernicke's encephalopathy -There was question of patient having alcohol abuse however alcohol level less than 10.  -Urine drug screen negative -Completed IV thiamine.  Currently on oral thiamine now.  -Continue folic acid, multivitamin. -Outpatient follow-up with Neurology. -Continue with haloperidol lactate 5 mg IM every 6 hours as needed for Agitation and with lorazepam 1 mg IM every 6 hours as needed for extreme agitation not resolved by the Haldol -Continue with hydroxyzine 25 mg p.o. twice daily as needed for anxiety -Mental status still fluctuating.  Suspect underlying dementia with memory issues.     Essential Hypertension -Continue Amlodipine 5 mg po Daily .  History of Alcohol Abuse -Treated with CIWA protocol.   -Continue folate, thiamine and multivitamin.  -Continue with hydroxyzine as above -Currently not withdrawing   History of Tobacco Abuse -Continue nicotine patch 14 mg transdermal patch every 24 hours  Generalized Weakness and Deconditioning -PT/OT recommending SNF.  Social worker following. -Feel that patient does need supervision as she appears to have short-term memory impairment.   Normocytic Anemia -Patient's hemoglobin/hematocrit is now 13.0/39.7 on last check (03/03/2019)  -Continue monitor and trend CBC intermittently and follow for signs and symptoms of bleeding  DVT prophylaxis: Enoxaparin 40 mg subcu every 24 Code Status: FULL CODE  Family Communication: No family present at bedside  Disposition Plan: SNF when bed is available; Currently medically stable for D/C But unable to find a SNF  Consultants:   Neurology  Psychiatry   Procedures: None   Antimicrobials:  Anti-infectives (From admission, onward)   Start     Dose/Rate Route Frequency Ordered Stop   12/14/18 0100  cefTRIAXone (ROCEPHIN) 1 g in sodium chloride 0.9 %  100 mL IVPB  Status:  Discontinued     1 g 200 mL/hr over 30 Minutes Intravenous Every 24  hours 12/14/18 0010 12/17/18 0948     Subjective: Seen And examined at bedside he was talking on the phone the entire time I was in the room and would not get off for me to speak with her.  She denied any complaints but did let me examine her.  No nausea or vomiting.  Was about to walk the halls.  No other concerns or points at this time  Objective: Vitals:   03/03/19 0829 03/03/19 1518 03/03/19 2300 03/04/19 0819  BP: 106/70 128/74 99/67 125/85  Pulse: 71 95 81 85  Resp: 18 (!) 21 14 16   Temp: 98.2 F (36.8 C) 99.1 F (37.3 C) 98.4 F (36.9 C) 97.9 F (36.6 C)  TempSrc: Oral Oral    SpO2: 98% 98% 98% 100%  Weight:      Height:        Intake/Output Summary (Last 24 hours) at 03/04/2019 0831 Last data filed at 03/03/2019 1300 Gross per 24 hour  Intake 720 ml  Output -  Net 720 ml   Filed Weights   12/15/18 0101 02/22/19 0359  Weight: 98.4 kg 67.1 kg   Examination: Physical Exam:  Constitutional: WN/WD African-American female currently in NAD and appears calm and she is talking on the phone  Respiratory: 02/24/19 diminished to auscultation bilaterally, no wheezing, rales, rhonchi or crackles. Normal respiratory effort and patient is not tachypenic. No accessory muscle use.  Unlabored breathing is not wearing supplemental oxygen via cannula Cardiovascular: RRR, no murmurs / rubs / gallops. S1 and S2 auscultated.  Scant extremity edema. 2+ pedal pulses. No carotid bruits.  Abdomen: Soft, non-tender, non-distended. Bowel sounds positive.  Neurologic: CN 2-12 grossly intact with no focal deficits. Romberg sign and cerebellar reflexes not assessed.  Psychiatric: Normal judgment and insight. Alert and oriented x 3. Normal mood and appropriate affect.   Data Reviewed: I have personally reviewed following labs and imaging studies  CBC: Recent Labs  Lab 03/03/19 1110  WBC 6.4  NEUTROABS 3.0  HGB 13.0  HCT 39.7  MCV 88.8  PLT 358   Basic Metabolic Panel: Recent Labs  Lab  03/03/19 1110  NA 142  K 4.0  CL 108  CO2 23  GLUCOSE 89  BUN 11  CREATININE 0.57  CALCIUM 9.9  MG 2.0  PHOS 4.3   GFR: Estimated Creatinine Clearance: 71.8 mL/min (by C-G formula based on SCr of 0.57 mg/dL). Liver Function Tests: Recent Labs  Lab 03/03/19 1110  AST 18  ALT 14  ALKPHOS 39  BILITOT 0.6  PROT 7.1  ALBUMIN 3.6   No results for input(s): LIPASE, AMYLASE in the last 168 hours. No results for input(s): AMMONIA in the last 168 hours. Coagulation Profile: No results for input(s): INR, PROTIME in the last 168 hours. Cardiac Enzymes: No results for input(s): CKTOTAL, CKMB, CKMBINDEX, TROPONINI in the last 168 hours. BNP (last 3 results) No results for input(s): PROBNP in the last 8760 hours. HbA1C: No results for input(s): HGBA1C in the last 72 hours. CBG: No results for input(s): GLUCAP in the last 168 hours. Lipid Profile: No results for input(s): CHOL, HDL, LDLCALC, TRIG, CHOLHDL, LDLDIRECT in the last 72 hours. Thyroid Function Tests: No results for input(s): TSH, T4TOTAL, FREET4, T3FREE, THYROIDAB in the last 72 hours. Anemia Panel: No results for input(s): VITAMINB12, FOLATE, FERRITIN, TIBC, IRON, RETICCTPCT in the last 72 hours.  Sepsis Labs: No results for input(s): PROCALCITON, LATICACIDVEN in the last 168 hours.  No results found for this or any previous visit (from the past 240 hour(s)).   RN Pressure Injury Documentation:     Estimated body mass index is 23.18 kg/m as calculated from the following:   Height as of this encounter: 5\' 7"  (1.702 m).   Weight as of this encounter: 67.1 kg.  Malnutrition Type:  Nutrition Problem: Increased nutrient needs Etiology: acute illness   Malnutrition Characteristics:  Signs/Symptoms: estimated needs   Nutrition Interventions:  Interventions: Ensure Enlive (each supplement provides 350kcal and 20 grams of protein), MVI   Radiology Studies: No results found.  Scheduled Meds: . amLODipine   5 mg Oral Daily  . enoxaparin (LOVENOX) injection  40 mg Subcutaneous Q24H  . feeding supplement (ENSURE ENLIVE)  237 mL Oral BID BM  . folic acid  1 mg Oral Daily  . ketoconazole   Topical BID  . multivitamin with minerals  1 tablet Oral Daily  . nicotine  14 mg Transdermal Daily  . thiamine  100 mg Oral Daily    LOS: 81 days   Kerney Elbe, DO [facility] PAGER is on AMION  If 7PM-7AM, please contact night-coverage [email or web address]

## 2019-03-05 ENCOUNTER — Inpatient Hospital Stay (HOSPITAL_COMMUNITY): Payer: Medicaid Other

## 2019-03-05 DIAGNOSIS — R0789 Other chest pain: Secondary | ICD-10-CM

## 2019-03-05 DIAGNOSIS — R0602 Shortness of breath: Secondary | ICD-10-CM

## 2019-03-05 LAB — COMPREHENSIVE METABOLIC PANEL
ALT: 14 U/L (ref 0–44)
AST: 18 U/L (ref 15–41)
Albumin: 3.5 g/dL (ref 3.5–5.0)
Alkaline Phosphatase: 39 U/L (ref 38–126)
Anion gap: 10 (ref 5–15)
BUN: 12 mg/dL (ref 8–23)
CO2: 24 mmol/L (ref 22–32)
Calcium: 9.6 mg/dL (ref 8.9–10.3)
Chloride: 110 mmol/L (ref 98–111)
Creatinine, Ser: 0.63 mg/dL (ref 0.44–1.00)
GFR calc Af Amer: >60 mL/min (ref >60)
GFR calc non Af Amer: >60 mL/min (ref >60)
Glucose, Bld: 109 mg/dL — ABNORMAL HIGH (ref 70–99)
Potassium: 4.1 mmol/L (ref 3.5–5.1)
Sodium: 144 mmol/L (ref 135–145)
Total Bilirubin: 0.9 mg/dL (ref 0.3–1.2)
Total Protein: 7 g/dL (ref 6.5–8.1)

## 2019-03-05 LAB — CBC WITH DIFFERENTIAL/PLATELET
Abs Immature Granulocytes: 0.02 10*3/uL (ref 0.00–0.07)
Basophils Absolute: 0 10*3/uL (ref 0.0–0.1)
Basophils Relative: 0 %
Eosinophils Absolute: 0.2 10*3/uL (ref 0.0–0.5)
Eosinophils Relative: 2 %
HCT: 37 % (ref 36.0–46.0)
Hemoglobin: 12.2 g/dL (ref 12.0–15.0)
Immature Granulocytes: 0 %
Lymphocytes Relative: 40 %
Lymphs Abs: 3.1 10*3/uL (ref 0.7–4.0)
MCH: 28.6 pg (ref 26.0–34.0)
MCHC: 33 g/dL (ref 30.0–36.0)
MCV: 86.7 fL (ref 80.0–100.0)
Monocytes Absolute: 0.8 10*3/uL (ref 0.1–1.0)
Monocytes Relative: 10 %
Neutro Abs: 3.6 10*3/uL (ref 1.7–7.7)
Neutrophils Relative %: 48 %
Platelets: 361 10*3/uL (ref 150–400)
RBC: 4.27 MIL/uL (ref 3.87–5.11)
RDW: 14.9 % (ref 11.5–15.5)
WBC: 7.7 10*3/uL (ref 4.0–10.5)
nRBC: 0 % (ref 0.0–0.2)

## 2019-03-05 LAB — TROPONIN I (HIGH SENSITIVITY)
Troponin I (High Sensitivity): <2 ng/L (ref <18)
Troponin I (High Sensitivity): <2 ng/L (ref <18)

## 2019-03-05 LAB — MAGNESIUM: Magnesium: 2 mg/dL (ref 1.7–2.4)

## 2019-03-05 MED ORDER — SODIUM CHLORIDE 0.9 % IV SOLN: INTRAVENOUS | Status: AC

## 2019-03-05 NOTE — Progress Notes (Signed)
PROGRESS NOTE    Marie Woods  YTK:[id number] DOB: 1955/08/18 DOA: 12/13/2018 PCP: Ladell Pier, MD  Brief Narrative: The patient is a 64 year old African-American female with a past medical history significant for but not limited to asthma, hypertension, alcohol abuse who originally presented on 12/13/2018 for confusion and altered mental status.  She was found to have a UTI with urine cultures showing multiple species.  MRI of the brain was done and was suggestive of possible Wernicke's encephalopathy. She was treated with thiamine.  She subsequently improved and PT OT recommended SNF placement.  She is unsafe to discharge home given her mental status fluctuating with underlying dementia and suspected difficulty with memory issues.  She will need to be discharged to skilled nursing facility for safe disposition however currently bed is unavailable per social worker and they are in the process of attempting to place the patient.  Assessment & Plan:   Principal Problem:   AMS (altered mental status) Active Problems:   History of alcohol use   TOBACCO ABUSE   Essential hypertension   Confusion   Acute metabolic encephalopathy   Agitation   Acute lower UTI   Sepsis (HCC)   Sepsis secondary to UTI (HCC)   Normocytic anemia  Severe sepsis 2/2 UTI: Present on admission UTI -Urine culture showed multiple species.  Repeat culture showed less than 10,000 colonies.   -Was initially treated with IV Rocephin and fluids.   -Subsequently antibiotics have been discontinued. -Sepsis physiology has resolved and she is hemodynamically stable  -Chest x-ray was negative for infection. -Currently hemodynamically stable  Acute metabolic encephalopathy Possible Wernicke's encephalopathy -CT of the head showed mild atrophic changes without acute intracranial abnormality -MRI of the brain: Symmetric diffusion weighted and T2/FLAIR hyperintense signal abnormality within the medial thalami.  Findings favor sequelae of Wernicke's encephalopathy -There was question of patient having alcohol abuse however alcohol level less than 10.  -Urine drug screen negative -Completed IV thiamine.  Currently on oral thiamine now.  -Continue folic acid, multivitamin. -Outpatient follow-up with Neurology. -Continue with haloperidol lactate 5 mg IM every 6 hours as needed for Agitation and with lorazepam 1 mg IM every 6 hours as needed for extreme agitation not resolved by the Haldol -Continue with hydroxyzine 25 mg p.o. twice daily as needed for anxiety -Mental status still fluctuating.  Suspect underlying dementia with memory issues.     Essential Hypertension -Continue Amlodipine 5 mg po Daily but held today for her transient dizziness, CP and SOB  History of Alcohol Abuse -Treated with CIWA protocol.   -Continue folate, thiamine and multivitamin.  -Continue with hydroxyzine as above -Currently not withdrawing   History of Tobacco Abuse -Continue nicotine patch 14 mg transdermal patch every 24 hours  Generalized Weakness and Deconditioning -PT/OT recommending SNF.  Social worker following. -Feel that patient does need supervision as she appears to have short-term memory impairment.   Normocytic Anemia -Patient's hemoglobin/hematocrit is now 12.2/37.0 from 13.0/39.7 on previous check (03/03/2019)  -Continue monitor and trend CBC intermittently and follow for signs and symptoms of bleeding  Chest Tightness, SOB, and Dizziness -Transient and now resolved  -Her Amlodipine 5 mg po Daily was held this AM by Nursing  -Obtain EKG, CXR, CBC, CMP, Mag, and Phos; CXR unremarkable, Labs wNL and EKG personally reviewed showed a sinus rhythm at a rate of 80 with occasional PVCs and a QTC of 440 with no evidence of ST elevation or depression on my interpretation -Check Orthostatic Vital Signs -Gentle IVF  Hydration with NS at 50 mL/hr x10 hours -C/w Meclizine 12.5 mg po TIDprn Dizziness -Check  Troponin and was <2 with repeat pending -No ECHO on file but if not improving or if continues to happen obtain ECHO -Also consider obtaining a CTA of the Chest to r/o PE but clinically do not feel as if she has a PE given that she ambulates and is on Lovenox  -Continue to monitor carefully but she states that her chest pain and shortness of breath and dizziness has resolved  Hyperglycemia -Mild at 109 -Continue to Monitor and Trend  -Repeat BMP intermittently   DVT prophylaxis: Enoxaparin 40 mg subcu every 24 Code Status: FULL CODE  Family Communication: No family present at bedside  Disposition Plan: SNF when bed is available; Currently medically stable for D/C But unable to find a SNF  Consultants:   Neurology  Psychiatry   Procedures: None   Antimicrobials:  Anti-infectives (From admission, onward)   Start     Dose/Rate Route Frequency Ordered Stop   12/14/18 0100  cefTRIAXone (ROCEPHIN) 1 g in sodium chloride 0.9 % 100 mL IVPB  Status:  Discontinued     1 g 200 mL/hr over 30 Minutes Intravenous Every 24 hours 12/14/18 0010 12/17/18 0948     Subjective: Seen And examined at bedside and states that she was doing okay.  Denies any chest pain, lightheadedness or dizziness when I asked her however she told her nurse that she was having this prior to when I came in to evaluate her.  States that she feels okay.  I left the room nursing came out to speak with me and states that the patient was not being forthcoming with me and she states that the patient " told the doctor what he wants to hear because he makes me do too much."  Objective: Vitals:   03/04/19 0819 03/04/19 1545 03/04/19 2327 03/05/19 0820  BP: 125/85 114/78 97/70 108/71  Pulse: 85 98 80 73  Resp: 16 16  18   Temp: 97.9 F (36.6 C) 98.4 F (36.9 C) 98.1 F (36.7 C) 98.2 F (36.8 C)  TempSrc: Oral Oral    SpO2: 100% 100% 99% 93%  Weight:      Height:       No intake or output data in the 24 hours ending  03/05/19 03/07/19 Filed Weights   12/15/18 0101 02/22/19 0359  Weight: 98.4 kg 67.1 kg   Examination: Physical Exam:  Constitutional: WN/WD AAF in NAD and appears calm  Eyes: Lids and conjunctivae normal, sclerae anicteric  ENMT: External Ears, Nose appear normal. Grossly normal hearing.  Neck: Appears normal, supple, no cervical masses, normal ROM, no appreciable thyromegaly; no JVD Respiratory: Diminished to auscultation bilaterally, no wheezing, rales, rhonchi or crackles. Normal respiratory effort and patient is not tachypenic. No accessory muscle use. Unlabored breathing  Cardiovascular: RRR, no murmurs / rubs / gallops. S1 and S2 auscultated. Trace extremity edema. Abdomen: Soft, non-tender, Distended slightly. Bowel sounds positive x4.  GU: Deferred. Musculoskeletal: No clubbing / cyanosis of digits/nails. No joint deformity upper and lower extremities Skin: No rashes, lesions, ulcers on a limited skin evaluation. No induration; Warm and dry.  Neurologic: CN 2-12 grossly intact with no focal deficits. Romberg sign and cerebellar reflexes not assessed.  Psychiatric: Normal judgment and insight. Alert and oriented x 3. Normal mood and appropriate affect.   Data Reviewed: I have personally reviewed following labs and imaging studies  CBC: Recent Labs  Lab 03/03/19 1110  WBC 6.4  NEUTROABS 3.0  HGB 13.0  HCT 39.7  MCV 88.8  PLT 358   Basic Metabolic Panel: Recent Labs  Lab 03/03/19 1110  NA 142  K 4.0  CL 108  CO2 23  GLUCOSE 89  BUN 11  CREATININE 0.57  CALCIUM 9.9  MG 2.0  PHOS 4.3   GFR: Estimated Creatinine Clearance: 71.8 mL/min (by C-G formula based on SCr of 0.57 mg/dL). Liver Function Tests: Recent Labs  Lab 03/03/19 1110  AST 18  ALT 14  ALKPHOS 39  BILITOT 0.6  PROT 7.1  ALBUMIN 3.6   No results for input(s): LIPASE, AMYLASE in the last 168 hours. No results for input(s): AMMONIA in the last 168 hours. Coagulation Profile: No results for  input(s): INR, PROTIME in the last 168 hours. Cardiac Enzymes: No results for input(s): CKTOTAL, CKMB, CKMBINDEX, TROPONINI in the last 168 hours. BNP (last 3 results) No results for input(s): PROBNP in the last 8760 hours. HbA1C: No results for input(s): HGBA1C in the last 72 hours. CBG: No results for input(s): GLUCAP in the last 168 hours. Lipid Profile: No results for input(s): CHOL, HDL, LDLCALC, TRIG, CHOLHDL, LDLDIRECT in the last 72 hours. Thyroid Function Tests: No results for input(s): TSH, T4TOTAL, FREET4, T3FREE, THYROIDAB in the last 72 hours. Anemia Panel: No results for input(s): VITAMINB12, FOLATE, FERRITIN, TIBC, IRON, RETICCTPCT in the last 72 hours. Sepsis Labs: No results for input(s): PROCALCITON, LATICACIDVEN in the last 168 hours.  No results found for this or any previous visit (from the past 240 hour(s)).   RN Pressure Injury Documentation:     Estimated body mass index is 23.18 kg/m as calculated from the following:   Height as of this encounter: 5\' 7"  (1.702 m).   Weight as of this encounter: 67.1 kg.  Malnutrition Type:  Nutrition Problem: Increased nutrient needs Etiology: acute illness   Malnutrition Characteristics:  Signs/Symptoms: estimated needs   Nutrition Interventions:  Interventions: Ensure Enlive (each supplement provides 350kcal and 20 grams of protein), MVI   Radiology Studies: No results found.  Scheduled Meds: . amLODipine  5 mg Oral Daily  . enoxaparin (LOVENOX) injection  40 mg Subcutaneous Q24H  . feeding supplement (ENSURE ENLIVE)  237 mL Oral BID BM  . folic acid  1 mg Oral Daily  . ketoconazole   Topical BID  . multivitamin with minerals  1 tablet Oral Daily  . nicotine  14 mg Transdermal Daily  . thiamine  100 mg Oral Daily    LOS: 82 days   , DO [facility] PAGER is on AMION  If 7PM-7AM, please contact night-coverage [email or web address]

## 2019-03-06 LAB — PHOSPHORUS: Phosphorus: 4.4 mg/dL (ref 2.5–4.6)

## 2019-03-06 LAB — SARS CORONAVIRUS 2 (TAT 6-24 HRS): SARS Coronavirus 2: NEGATIVE

## 2019-03-06 MED ORDER — SENNOSIDES-DOCUSATE SODIUM 8.6-50 MG PO TABS
1.0000 | ORAL_TABLET | Freq: Two times a day (BID) | ORAL | Status: DC
  Administered 2019-03-06 – 2019-05-18 (×124): 1 via ORAL
  Filled 2019-03-06 (×131): qty 1

## 2019-03-06 MED ORDER — POLYETHYLENE GLYCOL 3350 17 G PO PACK
17.0000 g | PACK | Freq: Two times a day (BID) | ORAL | Status: DC
  Administered 2019-03-06 – 2019-05-18 (×100): 17 g via ORAL
  Filled 2019-03-06 (×118): qty 1

## 2019-03-06 NOTE — TOC Progression Note (Addendum)
Transition of Care [facility]) - Progression Note    Patient Details  Name: Marie Woods MRN: [id number] Date of Birth: 1955-12-02  [facility]) CM/SW Contact  Mearl Latin, LCSW Phone Number: 03/06/2019, 4:19 PM  Clinical Narrative:    Still no bed offers. CSW faxed referral to [facility] in [place].   Update: [facility] is not able to accept admissions for 20 days starting today.    Expected Discharge Plan: Skilled Nursing Facility Barriers to Discharge: No SNF bed, SNF Pending payor source - LOG  Expected Discharge Plan and Services Expected Discharge Plan: Skilled Nursing Facility In-house Referral: Clinical Social Work Discharge Planning Services: NA Post Acute Care Choice: Skilled Nursing Facility Living arrangements for the past 2 months: Apartment                 DME Arranged: N/A DME Agency: NA                   Social Determinants of Health (SDOH) Interventions    Readmission Risk Interventions No flowsheet data found.

## 2019-03-06 NOTE — Progress Notes (Signed)
PROGRESS NOTE    Marie Woods  RKY:[id number] DOB: 17-Oct-1955 DOA: 12/13/2018 PCP: Marcine Matar, MD  Brief Narrative: The patient is a 64 year old African-American female with a past medical history significant for but not limited to asthma, hypertension, alcohol abuse who originally presented on 12/13/2018 for confusion and altered mental status.  She was found to have a UTI with urine cultures showing multiple species.  MRI of the brain was done and was suggestive of possible Wernicke's encephalopathy. She was treated with thiamine.  She subsequently improved and PT OT recommended SNF placement.  She is unsafe to discharge home given her mental status fluctuating with underlying dementia and suspected difficulty with memory issues.  She will need to be discharged to skilled nursing facility for safe disposition however currently bed is unavailable per social worker and they are in the process of attempting to place the patient but disposition is very difficult.  Assessment & Plan:   Principal Problem:   AMS (altered mental status) Active Problems:   History of alcohol use   TOBACCO ABUSE   Essential hypertension   Confusion   Acute metabolic encephalopathy   Agitation   Acute lower UTI   Sepsis (HCC)   Sepsis secondary to UTI (HCC)   Normocytic anemia  Severe sepsis 2/2 UTI: Present on admission UTI -Urine culture showed multiple species.  Repeat culture showed less than 10,000 colonies.   -Was initially treated with IV Rocephin and fluids.   -Subsequently antibiotics have been discontinued. -Sepsis physiology has resolved and she is hemodynamically stable  -Chest x-ray was negative for infection. -Currently hemodynamically stable  Acute metabolic encephalopathy Possible Wernicke's encephalopathy -CT of the head showed mild atrophic changes without acute intracranial abnormality -MRI of the brain: Symmetric diffusion weighted and T2/FLAIR hyperintense signal  abnormality within the medial thalami. Findings favor sequelae of Wernicke's encephalopathy -There was question of patient having alcohol abuse however alcohol level less than 10.  -Urine drug screen negative -Completed IV thiamine.  Currently on oral thiamine now.  -Continue folic acid, multivitamin. -Outpatient follow-up with Neurology. -Continue with haloperidol lactate 5 mg IM every 6 hours as needed for Agitation and with lorazepam 1 mg IM every 6 hours as needed for extreme agitation not resolved by the Haldol -Continue with hydroxyzine 25 mg p.o. twice daily as needed for anxiety -Mental status still fluctuating.  Suspect underlying dementia with memory issues.     Essential Hypertension -Continue Amlodipine 5 mg po Daily but held today for her transient dizziness, CP and SOB  History of Alcohol Abuse -Treated with CIWA protocol.   -Continue folate, thiamine and multivitamin.  -Continue with hydroxyzine as above -Currently not withdrawing   History of Tobacco Abuse -Continue nicotine patch 14 mg transdermal patch every 24 hours  Generalized Weakness and Deconditioning -PT/OT recommending SNF.  Social worker following. -Feel that patient does need supervision as she appears to have short-term memory impairment.   Normocytic Anemia -Patient's hemoglobin/hematocrit is now 12.2/37.0 from 13.0/39.7 on previous check (03/03/2019)  -Continue monitor and trend CBC intermittently and follow for signs and symptoms of bleeding  Chest Tightness, SOB, and Dizziness -Transient and now resolved  -Her Amlodipine 5 mg po Daily was held yesterday AM by Nursing  -Obtained EKG, CXR, CBC, CMP, Mag, and Phos; CXR unremarkable, Labs wNL and EKG personally reviewed showed a sinus rhythm at a rate of 80 with occasional PVCs and a QTC of 440 with no evidence of ST elevation or depression on my interpretation -Check  Orthostatic Vital Signs and she was not Orthostatic  -Gentle IVF Hydration with  NS at 50 mL/hr x10 hours -C/w Meclizine 12.5 mg po TIDprn Dizziness -Check Troponin and was <2 x2 -No ECHO on file but if not improving or if continues to happen obtain ECHO -Also consider obtaining a CTA of the Chest to r/o PE but clinically do not feel as if she has a PE given that she ambulates and is on Lovenox  -Continue to monitor carefully but she states that her chest pain and shortness of breath and dizziness has resolved  Hyperglycemia -Mild at 109 -Continue to Monitor and Trend  -Repeat BMP intermittently   Constipation -Started Bowel Regimen with Miralax 17 grams po BID and with Senna-Docusate 1 tab po BID  DVT prophylaxis: Enoxaparin 40 mg subcu every 24 Code Status: FULL CODE  Family Communication: No family present at bedside  Disposition Plan: SNF when bed is available; Currently medically stable for D/C But unable to find a SNF  Consultants:   Neurology  Psychiatry   Procedures: None   Antimicrobials:  Anti-infectives (From admission, onward)   Start     Dose/Rate Route Frequency Ordered Stop   12/14/18 0100  cefTRIAXone (ROCEPHIN) 1 g in sodium chloride 0.9 % 100 mL IVPB  Status:  Discontinued     1 g 200 mL/hr over 30 Minutes Intravenous Every 24 hours 12/14/18 0010 12/17/18 0948     Subjective: Seen and examined and she had no complaints.  She is sitting up eating breakfast.  States that she denies any chest pain, lightheadedness or dizziness.  No nausea or vomiting.  Feels well.  No other concerns or complaints at this time.  Cannot remember the last time she had a bowel movement.  No other concerns or complaints at this time.  Objective: Vitals:   03/05/19 1200 03/05/19 1724 03/05/19 1747 03/05/19 2320  BP: 114/79 124/88 117/78 104/71  Pulse: 98 86 84 79  Resp:  16 16 18   Temp:  98.4 F (36.9 C)  98.4 F (36.9 C)  TempSrc:    Oral  SpO2:  100% 99% 100%  Weight:      Height:        Intake/Output Summary (Last 24 hours) at 03/06/2019  0756 Last data filed at 03/05/2019 1601 Gross per 24 hour  Intake 0 ml  Output --  Net 0 ml   Filed Weights   12/15/18 0101 02/22/19 0359  Weight: 98.4 kg 67.1 kg   Examination: Physical Exam:  Constitutional:  Well-nourished, well-developed African-American female currently no acute distress appears calm Respiratory:  Slightly diminished to auscultation bilaterally no appreciable wheezing, rales, rhonchi.  Patient not tachypneic or using any accessory muscles to breathe Cardiovascular:  Regular rate and rhythm.   Abdomen:  Soft, nontender, nondistended.  Bowel sounds present Neurologic:  Cranial nerves II through XII grossly intact no appreciable focal deficits Psychiatric:  Normal mood and affect.  She is awake and alert.  Data Reviewed: I have personally reviewed following labs and imaging studies  CBC: Recent Labs  Lab 03/03/19 1110 03/05/19 1026  WBC 6.4 7.7  NEUTROABS 3.0 3.6  HGB 13.0 12.2  HCT 39.7 37.0  MCV 88.8 86.7  PLT 358 361   Basic Metabolic Panel: Recent Labs  Lab 03/03/19 1110 03/05/19 1026 03/06/19 0331  NA 142 144  --   K 4.0 4.1  --   CL 108 110  --   CO2 23 24  --   GLUCOSE  89 109*  --   BUN 11 12  --   CREATININE 0.57 0.63  --   CALCIUM 9.9 9.6  --   MG 2.0 2.0  --   PHOS 4.3  --  4.4   GFR: Estimated Creatinine Clearance: 71.8 mL/min (by C-G formula based on SCr of 0.63 mg/dL). Liver Function Tests: Recent Labs  Lab 03/03/19 1110 03/05/19 1026  AST 18 18  ALT 14 14  ALKPHOS 39 39  BILITOT 0.6 0.9  PROT 7.1 7.0  ALBUMIN 3.6 3.5   No results for input(s): LIPASE, AMYLASE in the last 168 hours. No results for input(s): AMMONIA in the last 168 hours. Coagulation Profile: No results for input(s): INR, PROTIME in the last 168 hours. Cardiac Enzymes: No results for input(s): CKTOTAL, CKMB, CKMBINDEX, TROPONINI in the last 168 hours. BNP (last 3 results) No results for input(s): PROBNP in the last 8760 hours. HbA1C: No results  for input(s): HGBA1C in the last 72 hours. CBG: No results for input(s): GLUCAP in the last 168 hours. Lipid Profile: No results for input(s): CHOL, HDL, LDLCALC, TRIG, CHOLHDL, LDLDIRECT in the last 72 hours. Thyroid Function Tests: No results for input(s): TSH, T4TOTAL, FREET4, T3FREE, THYROIDAB in the last 72 hours. Anemia Panel: No results for input(s): VITAMINB12, FOLATE, FERRITIN, TIBC, IRON, RETICCTPCT in the last 72 hours. Sepsis Labs: No results for input(s): PROCALCITON, LATICACIDVEN in the last 168 hours.  Recent Results (from the past 240 hour(s))  SARS CORONAVIRUS 2 (TAT 6-24 HRS) Nasopharyngeal Nasopharyngeal Swab     Status: None   Collection Time: 03/05/19  7:24 PM   Specimen: Nasopharyngeal Swab  Result Value Ref Range Status   SARS Coronavirus 2 NEGATIVE NEGATIVE Final    Comment: (NOTE) SARS-CoV-2 target nucleic acids are NOT DETECTED. The SARS-CoV-2 RNA is generally detectable in upper and lower respiratory specimens during the acute phase of infection. Negative results do not preclude SARS-CoV-2 infection, do not rule out co-infections with other pathogens, and should not be used as the sole basis for treatment or other patient management decisions. Negative results must be combined with clinical observations, patient history, and epidemiological information. The expected result is Negative. Fact Sheet for Patients: SugarRoll.be Fact Sheet for Healthcare Providers: https://www.Woods-mathews.com/ This test is not yet approved or cleared by the [place] FDA and  has been authorized for detection and/or diagnosis of SARS-CoV-2 by FDA under an Emergency Use Authorization (EUA). This EUA will remain  in effect (meaning this test can be used) for the duration of the COVID-19 declaration under Section 56 4(b)(1) of the Act, 21 U.S.C. section 360bbb-3(b)(1), unless the authorization is terminated or revoked  sooner. Performed at [facility], [place] [address] [place],  [address]      RN Pressure Injury Documentation:     Estimated body mass index is 23.18 kg/m as calculated from the following:   Height as of this encounter: 5\' 7"  (1.702 m).   Weight as of this encounter: 67.1 kg.  Malnutrition Type:  Nutrition Problem: Increased nutrient needs Etiology: acute illness   Malnutrition Characteristics:  Signs/Symptoms: estimated needs   Nutrition Interventions:  Interventions: Ensure Enlive (each supplement provides 350kcal and 20 grams of protein), MVI   Radiology Studies: DG CHEST PORT 1 VIEW  Result Date: 03/05/2019 CLINICAL DATA:  Shortness of breath EXAM: PORTABLE CHEST 1 VIEW COMPARISON:  Chest radiograph 12/14/2018 FINDINGS: Normal cardiac and mediastinal contours. No consolidative pulmonary opacities. No pleural effusion or pneumothorax. Thoracic spine degenerative  changes. IMPRESSION: No acute cardiopulmonary process. Electronically Signed   By: Annia Belt M.D.   On: 03/05/2019 10:11   Scheduled Meds: . amLODipine  5 mg Oral Daily  . enoxaparin (LOVENOX) injection  40 mg Subcutaneous Q24H  . feeding supplement (ENSURE ENLIVE)  237 mL Oral BID BM  . folic acid  1 mg Oral Daily  . ketoconazole   Topical BID  . multivitamin with minerals  1 tablet Oral Daily  . nicotine  14 mg Transdermal Daily  . thiamine  100 mg Oral Daily    LOS: 83 days   Merlene Laughter, DO [facility] PAGER is on AMION  If 7PM-7AM, please contact night-coverage [email or web address]

## 2019-03-07 NOTE — Progress Notes (Signed)
PROGRESS NOTE    Marie Woods  PJA:[id number] DOB: 1956-01-16 DOA: 12/13/2018 PCP: Marcine Matar, MD  Brief Narrative: The patient is a 64 year old African-American female with a past medical history significant for but not limited to asthma, hypertension, alcohol abuse who originally presented on 12/13/2018 for confusion and altered mental status.  She was found to have a UTI with urine cultures showing multiple species.  MRI of the brain was done and was suggestive of possible Wernicke's encephalopathy. She was treated with thiamine.  She subsequently improved and PT OT recommended SNF placement.  She is unsafe to discharge home given her mental status fluctuating with underlying dementia and suspected difficulty with memory issues.  She will need to be discharged to skilled nursing facility for safe disposition however currently bed is unavailable per Child psychotherapist and they are in the process of attempting to place the patient but disposition is very difficult most recent SNF that the patient was faster to cannot accept admissions for 20 days  Assessment & Plan:   Principal Problem:   AMS (altered mental status) Active Problems:   History of alcohol use   TOBACCO ABUSE   Essential hypertension   Confusion   Acute metabolic encephalopathy   Agitation   Acute lower UTI   Sepsis (HCC)   Sepsis secondary to UTI (HCC)   Normocytic anemia  Severe sepsis 2/2 UTI: Present on admission UTI -Urine culture showed multiple species.  Repeat culture showed less than 10,000 colonies.   -Was initially treated with IV Rocephin and fluids.   -Subsequently antibiotics have been discontinued. -Sepsis physiology has resolved and she is hemodynamically stable  -Chest x-ray was negative for infection. -Currently hemodynamically stable  Acute metabolic encephalopathy Possible Wernicke's encephalopathy -CT of the head showed mild atrophic changes without acute intracranial abnormality -MRI of  the brain: Symmetric diffusion weighted and T2/FLAIR hyperintense signal abnormality within the medial thalami. Findings favor sequelae of Wernicke's encephalopathy -There was question of patient having alcohol abuse however alcohol level less than 10.  -Urine drug screen negative -Completed IV thiamine.  Currently on oral thiamine now.  -Continue folic acid, multivitamin. -Outpatient follow-up with Neurology. -Continue with haloperidol lactate 5 mg IM every 6 hours as needed for Agitation and with Lorazepam 1 mg IM every 6 hours as needed for extreme agitation not resolved by the Haldol -Continue with hydroxyzine 25 mg p.o. twice daily as needed for anxiety -Mental status still fluctuating.  Suspect underlying dementia with memory issues.     Essential Hypertension -Continue Amlodipine 5 mg po Daily but held today for her transient dizziness, CP and SOB  History of Alcohol Abuse -Treated with CIWA protocol.   -Continue folate, thiamine and multivitamin.  -Continue with hydroxyzine as above -Currently not withdrawing   History of Tobacco Abuse -Continue nicotine patch 14 mg transdermal patch every 24 hours  Generalized Weakness and Deconditioning -PT/OT recommending SNF.  Social worker following. -Feel that patient does need supervision as she appears to have short-term memory impairment.   Normocytic Anemia -Patient's hemoglobin/hematocrit is now 12.2/37.0 from 13.0/39.7 on previous check (03/03/2019)  -Continue monitor and trend CBC intermittently and follow for signs and symptoms of bleeding  Chest Tightness, SOB, and Dizziness -Transient and now resolved  -Her Amlodipine 5 mg po Daily was held yesterday AM by Nursing  -Obtained EKG, CXR, CBC, CMP, Mag, and Phos; CXR unremarkable, Labs wNL and EKG personally reviewed showed a sinus rhythm at a rate of 80 with occasional PVCs and a  QTC of 440 with no evidence of ST elevation or depression on my interpretation -Check  Orthostatic Vital Signs and she was not Orthostatic  -Gentle IVF Hydration with NS at 50 mL/hr x10 hours -C/w Meclizine 12.5 mg po TIDprn Dizziness -Check Troponin and was <2 x2 -No ECHO on file but if not improving or if continues to happen obtain ECHO -Also consider obtaining a CTA of the Chest to r/o PE but clinically do not feel as if she has a PE given that she ambulates and is on Lovenox  -Continue to monitor carefully but she states that her chest pain and shortness of breath and dizziness has resolved  Hyperglycemia -Mild at 109 -Continue to Monitor and Trend  -Repeat BMP intermittently   Constipation -Started Bowel Regimen with Miralax 17 grams po BID and with Senna-Docusate 1 tab po BID  DVT prophylaxis: Enoxaparin 40 mg subcu every 24 Code Status: FULL CODE  Family Communication: No family present at bedside  Disposition Plan: SNF when bed is available; Currently medically stable for D/C But unable to find a SNF  Consultants:   Neurology  Psychiatry   Procedures: None   Antimicrobials:  Anti-infectives (From admission, onward)   Start     Dose/Rate Route Frequency Ordered Stop   12/14/18 0100  cefTRIAXone (ROCEPHIN) 1 g in sodium chloride 0.9 % 100 mL IVPB  Status:  Discontinued     1 g 200 mL/hr over 30 Minutes Intravenous Every 24 hours 12/14/18 0010 12/17/18 0948     Subjective: Seen and examined at bedside and when I walked in she was changing her bed sheets as she had a "accident on herself".  No nausea or vomiting.  Feels well.  No other concerns or complaints at this time.  Objective: Vitals:   03/05/19 2320 03/06/19 0800 03/06/19 1353 03/06/19 2259  BP: 104/71 112/75 114/79 93/62  Pulse: 79 70 78 70  Resp: 18 16 17 16   Temp: 98.4 F (36.9 C) 98.2 F (36.8 C) 98.3 F (36.8 C) 98 F (36.7 C)  TempSrc: Oral Oral    SpO2: 100%  100% 99%  Weight:      Height:        Intake/Output Summary (Last 24 hours) at 03/07/2019 0813 Last data filed at  03/06/2019 1548 Gross per 24 hour  Intake 600 ml  Output --  Net 600 ml   Filed Weights   12/15/18 0101 02/22/19 0359  Weight: 98.4 kg 67.1 kg   Examination: Physical Exam:  Constitutional:  Well-nourished, well-developed African-American female currently in no acute distress appears calm Respiratory:  Slightly diminished to auscultation bilaterally no appreciable wheezing, rales, rhonchi.  Patient not tachypneic or using any accessory muscles to breathe Cardiovascular:  Regular rate and rhythm.  No appreciable murmurs, rubs or gallops Abdomen: Soft, nontender, distended second body habitus. Neurologic:  Cranial nerves II through XII grossly intact no appreciable focal deficits Psychiatric:  Normal mood and affect.  She is awake and alert.  Data Reviewed: I have personally reviewed following labs and imaging studies  CBC: Recent Labs  Lab 03/03/19 1110 03/05/19 1026  WBC 6.4 7.7  NEUTROABS 3.0 3.6  HGB 13.0 12.2  HCT 39.7 37.0  MCV 88.8 86.7  PLT 358 696   Basic Metabolic Panel: Recent Labs  Lab 03/03/19 1110 03/05/19 1026 03/06/19 0331  NA 142 144  --   K 4.0 4.1  --   CL 108 110  --   CO2 23 24  --  GLUCOSE 89 109*  --   BUN 11 12  --   CREATININE 0.57 0.63  --   CALCIUM 9.9 9.6  --   MG 2.0 2.0  --   PHOS 4.3  --  4.4   GFR: Estimated Creatinine Clearance: 71.8 mL/min (by C-G formula based on SCr of 0.63 mg/dL). Liver Function Tests: Recent Labs  Lab 03/03/19 1110 03/05/19 1026  AST 18 18  ALT 14 14  ALKPHOS 39 39  BILITOT 0.6 0.9  PROT 7.1 7.0  ALBUMIN 3.6 3.5   No results for input(s): LIPASE, AMYLASE in the last 168 hours. No results for input(s): AMMONIA in the last 168 hours. Coagulation Profile: No results for input(s): INR, PROTIME in the last 168 hours. Cardiac Enzymes: No results for input(s): CKTOTAL, CKMB, CKMBINDEX, TROPONINI in the last 168 hours. BNP (last 3 results) No results for input(s): PROBNP in the last 8760  hours. HbA1C: No results for input(s): HGBA1C in the last 72 hours. CBG: No results for input(s): GLUCAP in the last 168 hours. Lipid Profile: No results for input(s): CHOL, HDL, LDLCALC, TRIG, CHOLHDL, LDLDIRECT in the last 72 hours. Thyroid Function Tests: No results for input(s): TSH, T4TOTAL, FREET4, T3FREE, THYROIDAB in the last 72 hours. Anemia Panel: No results for input(s): VITAMINB12, FOLATE, FERRITIN, TIBC, IRON, RETICCTPCT in the last 72 hours. Sepsis Labs: No results for input(s): PROCALCITON, LATICACIDVEN in the last 168 hours.  Recent Results (from the past 240 hour(s))  SARS CORONAVIRUS 2 (TAT 6-24 HRS) Nasopharyngeal Nasopharyngeal Swab     Status: None   Collection Time: 03/05/19  7:24 PM   Specimen: Nasopharyngeal Swab  Result Value Ref Range Status   SARS Coronavirus 2 NEGATIVE NEGATIVE Final    Comment: (NOTE) SARS-CoV-2 target nucleic acids are NOT DETECTED. The SARS-CoV-2 RNA is generally detectable in upper and lower respiratory specimens during the acute phase of infection. Negative results do not preclude SARS-CoV-2 infection, do not rule out co-infections with other pathogens, and should not be used as the sole basis for treatment or other patient management decisions. Negative results must be combined with clinical observations, patient history, and epidemiological information. The expected result is Negative. Fact Sheet for Patients: HairSlick.no Fact Sheet for Healthcare Providers: quierodirigir.com This test is not yet approved or cleared by the [place] FDA and  has been authorized for detection and/or diagnosis of SARS-CoV-2 by FDA under an Emergency Use Authorization (EUA). This EUA will remain  in effect (meaning this test can be used) for the duration of the COVID-19 declaration under Section 56 4(b)(1) of the Act, 21 U.S.C. section 360bbb-3(b)(1), unless the authorization is terminated  or revoked sooner. Performed at [facility], [address], [place], [place] [address]      RN Pressure Injury Documentation:     Estimated body mass index is 23.18 kg/m as calculated from the following:   Height as of this encounter: 5\' 7"  (1.702 m).   Weight as of this encounter: 67.1 kg.  Malnutrition Type:  Nutrition Problem: Increased nutrient needs Etiology: acute illness   Malnutrition Characteristics:  Signs/Symptoms: estimated needs   Nutrition Interventions:  Interventions: Ensure Enlive (each supplement provides 350kcal and 20 grams of protein), MVI   Radiology Studies: DG CHEST PORT 1 VIEW  Result Date: 03/05/2019 CLINICAL DATA:  Shortness of breath EXAM: PORTABLE CHEST 1 VIEW COMPARISON:  Chest radiograph 12/14/2018 FINDINGS: Normal cardiac and mediastinal contours. No consolidative pulmonary opacities. No pleural effusion or pneumothorax. Thoracic spine  degenerative changes. IMPRESSION: No acute cardiopulmonary process. Electronically Signed   By: Annia Belt M.D.   On: 03/05/2019 10:11   Scheduled Meds: . amLODipine  5 mg Oral Daily  . enoxaparin (LOVENOX) injection  40 mg Subcutaneous Q24H  . feeding supplement (ENSURE ENLIVE)  237 mL Oral BID BM  . folic acid  1 mg Oral Daily  . ketoconazole   Topical BID  . multivitamin with minerals  1 tablet Oral Daily  . nicotine  14 mg Transdermal Daily  . polyethylene glycol  17 g Oral BID  . senna-docusate  1 tablet Oral BID  . thiamine  100 mg Oral Daily    LOS: 84 days   Merlene Laughter, DO [facility] PAGER is on AMION  If 7PM-7AM, please contact night-coverage [email or web address]

## 2019-03-08 NOTE — Progress Notes (Signed)
Occupational Therapy Treatment Patient Details Name: Marie Woods MRN: [id number] DOB: November 26, 1955 Today's Date: 03/08/2019    History of present illness Patient is a 64 year old female with medical history significant of asthma, hypertension, alcohol abuse who was brought in by EMS after neighbors called in stating that patient is confused and altered. Patient admitted Severe Sepsis with Acute metabolic encephalopathy secondary to UTI.    OT comments  Pt continues to be pleasant, cooperative, and engage in cognitive challenges. Today worked in room on strategies, making lists, establishing routines, having items in particular PLACES (to aide in the establishment with routine), keeping a calendar. She continues to require mod A for remembering sequencing of tasks that she chooses, and requires cues for safety with RW. She is supervision for safety with ADL tasks and in room mobility this session. Ms. Fuquay is pleasant, cooperative, and was perseverating on her bracelet today (able to be re-directed, but came back to it again and again). OT will continue to follow acutely.    Follow Up Recommendations  SNF;Supervision/Assistance - 24 hour(ALF?)    Equipment Recommendations  3 in 1 bedside commode    Recommendations for Other Services      Precautions / Restrictions Precautions Precautions: Fall Restrictions Weight Bearing Restrictions: No       Mobility Bed Mobility Overal bed mobility: Modified Independent                Transfers Overall transfer level: Needs assistance Equipment used: Rolling walker (2 wheeled) Transfers: Sit to/from Stand Sit to Stand: Supervision         General transfer comment: Supervision for safety    Balance Overall balance assessment: Needs assistance Sitting-balance support: No upper extremity supported;Feet supported Sitting balance-Leahy Scale: Normal     Standing balance support: No upper extremity supported;Single extremity  supported;During functional activity Standing balance-Leahy Scale: Fair Standing balance comment: supervision - GREATLY improved with RW for dynamic                High Level Balance Comments: able to gather letters taped in low and high positions - required cues for safety with RW           ADL either performed or assessed with clinical judgement   ADL Overall ADL's : Needs assistance/impaired     Grooming: Wash/dry hands;Brushing hair;Standing Grooming Details (indicate cue type and reason): stood at sink for grooming activities, required prompt to remember 3 activities she was "tasked" to do (that she chose)         Upper Body Dressing : Minimal assistance;Sitting Upper Body Dressing Details (indicate cue type and reason): to don hospital gown as back side cover     Toilet Transfer: Supervision/safety;RW   Toileting- Clothing Manipulation and Hygiene: Supervision/safety;Sit to/from stand Toileting - Clothing Manipulation Details (indicate cue type and reason): able to manage peri care in sitting, and paper scrubs sit<>stand     Functional mobility during ADLs: Supervision/safety;Rolling walker General ADL Comments: focused on compensatory memory strategies to optimize recall during tasks     Vision       Perception     Praxis      Cognition Arousal/Alertness: Awake/alert Behavior During Therapy: WFL for tasks assessed/performed Overall Cognitive Status: History of cognitive impairments - at baseline Area of Impairment: Attention;Memory;Following commands;Safety/judgement;Awareness;Problem solving                   Current Attention Level: Selective Memory: Decreased short-term memory Following Commands: Follows one  step commands consistently;Follows one step commands with increased time Safety/Judgement: Decreased awareness of safety;Decreased awareness of deficits Awareness: Emergent Problem Solving: Requires verbal cues;Difficulty  sequencing General Comments: challenging ST memory, problem solving and attention        Exercises     Shoulder Instructions       General Comments VSS    Pertinent Vitals/ Pain       Pain Assessment: No/denies pain Pain Intervention(s): Monitored during session  Home Living                                          Prior Functioning/Environment              Frequency  Min 1X/week        Progress Toward Goals  OT Goals(current goals can now be found in the care plan section)  Progress towards OT goals: Progressing toward goals  Acute Rehab OT Goals Patient Stated Goal: return to PLOF OT Goal Formulation: With patient Time For Goal Achievement: 03/13/19 Potential to Achieve Goals: Good  Plan Discharge plan remains appropriate;Frequency remains appropriate    Co-evaluation                 AM-PAC OT "6 Clicks" Daily Activity     Outcome Measure   Help from another person eating meals?: None Help from another person taking care of personal grooming?: A Little Help from another person toileting, which includes using toliet, bedpan, or urinal?: A Little Help from another person bathing (including washing, rinsing, drying)?: A Little Help from another person to put on and taking off regular upper body clothing?: None Help from another person to put on and taking off regular lower body clothing?: A Little 6 Click Score: 20    End of Session Equipment Utilized During Treatment: Rolling walker;Gait belt  OT Visit Diagnosis: Unsteadiness on feet (R26.81);Other abnormalities of gait and mobility (R26.89);Muscle weakness (generalized) (M62.81);Other symptoms and signs involving cognitive function   Activity Tolerance Patient tolerated treatment well   Patient Left with call bell/phone within reach;Other (comment)(seated EOB)   Nurse Communication Mobility status        Time: 4627-0350 OT Time Calculation (min): 22 min  Charges: OT  General Charges $OT Visit: 1 Visit OT Treatments $Self Care/Home Management : 8-22 mins  Nyoka Cowden OTR/L Acute Rehabilitation Services Pager: [phone] Office: [phone]   Evern Bio Kadejah Sandiford 03/08/2019, 10:23 AM

## 2019-03-08 NOTE — Progress Notes (Signed)
PROGRESS NOTE    Marie Woods  ONG:[id number] DOB: 02-18-1955 DOA: 12/13/2018 PCP: Marcine Matar, MD   Brief Narrative:  64 year old female with history of asthma, hypertension, alcohol abuse was brought to the hospital for confusion and altered mental status. Found to have UTI. MRI suggestive of possible Warnicke's encephalopathy, patient was treated with thiamine. PT and OT have recommended SNF. Patient has been difficult to place.  Assessment & Plan   Severe sepsis secondary to UTI -Present on admission -Urine culture showed multiple species, repeat culture showed less than 10,000 colonies -Was initially treated with IV fluids and Rocephin -Sepsis resolved -Chest x-ray was negative for infection -Currently hemodynamically stable  Acute metabolic encephalopathy/possible Wernicke's encephalopathy -CT head showed mild atrophy neck changes without acute intercranial normality -MRI brain showed symmetric diffusion weighted and T2/FLAIR hyperintense signal abnormality within the medial thalami, favoring Wernicke's encephalopathy -Alcohol level was less than 10 on admission Urine drug screen was negative- -Patient completed IV thiamine and is currently on oral thiamine -Continue folic acid, multivitamin -Patient will need outpatient follow-up with neurology -? Underlying dementia with memory issues as mental status has been fluctuating. Although today patient does appear to be alert and oriented x3 -Of note, patient is on Haldol and Ativan as needed for extreme agitation; hydroxyzine for anxiety  Essential hypertension -Continue Norvasc  Alcohol abuse -Treated with CIWA protocol -No alcohol withdrawal noted -Continue thiamine, folate, multivitamin  Tobacco abuse -Nicotine patch  Generalized weakness -PT/OT recommending SNF -Deviously consulted -Patient needs supervision given her short-term memory impairment.   Normocytic anemia -Stable, no signs of  bleeding -Continue to monitor CBC  Chest tightness/shortness of breath and dizziness -Transient, have now resolved -Vitals obtained, patient not orthostatic  Constipation -Continue bowel regimen  DVT Prophylaxis Lovenox  Code Status: Full  Family Communication: None at bedside  Disposition Plan: Admitted. Patient presented from home with acute metabolic encephalopathy and sepsis secondary to UTI. Mental status waxes and wanes therefore patient does need SNF placement however difficult to place.  Consultants Neurology Psychiatry  Procedures  None  Antibiotics   Anti-infectives (From admission, onward)   Start     Dose/Rate Route Frequency Ordered Stop   12/14/18 0100  cefTRIAXone (ROCEPHIN) 1 g in sodium chloride 0.9 % 100 mL IVPB  Status:  Discontinued     1 g 200 mL/hr over 30 Minutes Intravenous Every 24 hours 12/14/18 0010 12/17/18 4401      Subjective:   [name] seen and examined today. She has no complaints this morning. States she feels tired. Denies current chest pain or shortness of breath, abdominal pain, nausea or vomiting, diarrhea, constipation, dizziness or headache.  Objective:   Vitals:   03/07/19 0921 03/07/19 1703 03/07/19 2253 03/08/19 0829  BP: (!) 124/97 107/79 113/67 101/63  Pulse: 89 85 72 74  Resp:  16  19  Temp:  99.1 F (37.3 C) (!) 97.4 F (36.3 C) 97.9 F (36.6 C)  TempSrc:    Oral  SpO2: 97% 99% 100% 100%  Weight:      Height:        Intake/Output Summary (Last 24 hours) at 03/08/2019 1036 Last data filed at 03/07/2019 1100 Gross per 24 hour  Intake 360 ml  Output --  Net 360 ml   Filed Weights   12/15/18 0101 02/22/19 0359  Weight: 98.4 kg 67.1 kg    Exam  General: Well developed, well nourished, NAD, appears stated age  HEENT: NCAT,  mucous membranes moist.  Cardiovascular: S1 S2 auscultated, RRR, no murmur  Respiratory: Clear to auscultation bilaterally with equal chest rise  Abdomen: Soft, nontender,  nondistended, + bowel sounds  Extremities: warm dry without cyanosis clubbing or edema  Neuro: AAOx3, nonfocal  Psych: Appropriate mood and affect   Data Reviewed: I have personally reviewed following labs and imaging studies  CBC: Recent Labs  Lab 03/03/19 1110 03/05/19 1026  WBC 6.4 7.7  NEUTROABS 3.0 3.6  HGB 13.0 12.2  HCT 39.7 37.0  MCV 88.8 86.7  PLT 358 416   Basic Metabolic Panel: Recent Labs  Lab 03/03/19 1110 03/05/19 1026 03/06/19 0331  NA 142 144  --   K 4.0 4.1  --   CL 108 110  --   CO2 23 24  --   GLUCOSE 89 109*  --   BUN 11 12  --   CREATININE 0.57 0.63  --   CALCIUM 9.9 9.6  --   MG 2.0 2.0  --   PHOS 4.3  --  4.4   GFR: Estimated Creatinine Clearance: 71.8 mL/min (by C-G formula based on SCr of 0.63 mg/dL). Liver Function Tests: Recent Labs  Lab 03/03/19 1110 03/05/19 1026  AST 18 18  ALT 14 14  ALKPHOS 39 39  BILITOT 0.6 0.9  PROT 7.1 7.0  ALBUMIN 3.6 3.5   No results for input(s): LIPASE, AMYLASE in the last 168 hours. No results for input(s): AMMONIA in the last 168 hours. Coagulation Profile: No results for input(s): INR, PROTIME in the last 168 hours. Cardiac Enzymes: No results for input(s): CKTOTAL, CKMB, CKMBINDEX, TROPONINI in the last 168 hours. BNP (last 3 results) No results for input(s): PROBNP in the last 8760 hours. HbA1C: No results for input(s): HGBA1C in the last 72 hours. CBG: No results for input(s): GLUCAP in the last 168 hours. Lipid Profile: No results for input(s): CHOL, HDL, LDLCALC, TRIG, CHOLHDL, LDLDIRECT in the last 72 hours. Thyroid Function Tests: No results for input(s): TSH, T4TOTAL, FREET4, T3FREE, THYROIDAB in the last 72 hours. Anemia Panel: No results for input(s): VITAMINB12, FOLATE, FERRITIN, TIBC, IRON, RETICCTPCT in the last 72 hours. Urine analysis:    Component Value Date/Time   COLORURINE AMBER (A) 12/13/2018 2218   APPEARANCEUR HAZY (A) 12/13/2018 2218   LABSPEC 1.027  12/13/2018 2218   PHURINE 5.0 12/13/2018 2218   GLUCOSEU NEGATIVE 12/13/2018 2218   HGBUR SMALL (A) 12/13/2018 2218   HGBUR small 06/28/2009 1504   BILIRUBINUR SMALL (A) 12/13/2018 2218   KETONESUR 5 (A) 12/13/2018 2218   PROTEINUR 30 (A) 12/13/2018 2218   UROBILINOGEN 1.0 09/28/2014 1426   NITRITE NEGATIVE 12/13/2018 2218   LEUKOCYTESUR TRACE (A) 12/13/2018 2218   Sepsis Labs: @LABRCNTIP (procalcitonin:4,lacticidven:4)  ) Recent Results (from the past 240 hour(s))  SARS CORONAVIRUS 2 (TAT 6-24 HRS) Nasopharyngeal Nasopharyngeal Swab     Status: None   Collection Time: 03/05/19  7:24 PM   Specimen: Nasopharyngeal Swab  Result Value Ref Range Status   SARS Coronavirus 2 NEGATIVE NEGATIVE Final    Comment: (NOTE) SARS-CoV-2 target nucleic acids are NOT DETECTED. The SARS-CoV-2 RNA is generally detectable in upper and lower respiratory specimens during the acute phase of infection. Negative results do not preclude SARS-CoV-2 infection, do not rule out co-infections with other pathogens, and should not be used as the sole basis for treatment or other patient management decisions. Negative results must be combined with clinical observations, patient history, and epidemiological information. The expected result is Negative. Fact Sheet for  Patients: HairSlick.no Fact Sheet for Healthcare Providers: quierodirigir.com This test is not yet approved or cleared by the [place] FDA and  has been authorized for detection and/or diagnosis of SARS-CoV-2 by FDA under an Emergency Use Authorization (EUA). This EUA will remain  in effect (meaning this test can be used) for the duration of the COVID-19 declaration under Section 56 4(b)(1) of the Act, 21 U.S.C. section 360bbb-3(b)(1), unless the authorization is terminated or revoked sooner. Performed at [facility], [address] [place], [place] [address]       Radiology  Studies: No results found.   Scheduled Meds: . amLODipine  5 mg Oral Daily  . enoxaparin (LOVENOX) injection  40 mg Subcutaneous Q24H  . feeding supplement (ENSURE ENLIVE)  237 mL Oral BID BM  . folic acid  1 mg Oral Daily  . ketoconazole   Topical BID  . multivitamin with minerals  1 tablet Oral Daily  . nicotine  14 mg Transdermal Daily  . polyethylene glycol  17 g Oral BID  . senna-docusate  1 tablet Oral BID  . thiamine  100 mg Oral Daily   Continuous Infusions:   LOS: 85 days   Time Spent in minutes   30 minutes  Milanie Rosenfield D.O. on 03/08/2019 at 10:36 AM  Between 7am to 7pm - Please see pager noted on [email or web address]  After 7pm go to [email or web address]  And look for the night coverage person covering for me after hours  [facility] Office  [phone]

## 2019-03-09 NOTE — Progress Notes (Signed)
PROGRESS NOTE    Marie Woods  EXB:[id number] DOB: 1955/11/22 DOA: 12/13/2018 PCP: Marcine Matar, MD   Brief Narrative:  64 year old female with history of asthma, hypertension, alcohol abuse was brought to the hospital for confusion and altered mental status. Found to have UTI. MRI suggestive of possible Warnicke's encephalopathy, patient was treated with thiamine. PT and OT have recommended SNF. Patient has been difficult to place.  Assessment & Plan   Severe sepsis secondary to UTI -Present on admission -Urine culture showed multiple species, repeat culture showed less than 10,000 colonies -Was initially treated with IV fluids and Rocephin -Sepsis resolved -Chest x-ray was negative for infection -Currently hemodynamically stable  Acute metabolic encephalopathy/possible Wernicke's encephalopathy -CT head showed mild atrophy neck changes without acute intercranial normality -MRI brain showed symmetric diffusion weighted and T2/FLAIR hyperintense signal abnormality within the medial thalami, favoring Wernicke's encephalopathy -Alcohol level was less than 10 on admission Urine drug screen was negative- -Patient completed IV thiamine and is currently on oral thiamine -Continue folic acid, multivitamin -Patient will need outpatient follow-up with neurology -? Underlying dementia with memory issues as mental status has been fluctuating. Although today patient does appear to be alert and oriented x3 -Of note, patient is on Haldol and Ativan as needed for extreme agitation; hydroxyzine for anxiety  Essential hypertension -Continue Norvasc  Alcohol abuse -Treated with CIWA protocol -No alcohol withdrawal noted -Continue thiamine, folate, multivitamin  Tobacco abuse -Nicotine patch  Generalized weakness -PT/OT recommending SNF -Deviously consulted -Patient needs supervision given her short-term memory impairment.   Normocytic anemia -Stable, no signs of  bleeding -Continue to monitor CBC  Chest tightness/shortness of breath and dizziness -Transient, have now resolved -Vitals obtained, patient not orthostatic  Constipation -Continue bowel regimen  DVT Prophylaxis Lovenox  Code Status: Full  Family Communication: None at bedside  Disposition Plan: Admitted. Patient presented from home with acute metabolic encephalopathy and sepsis secondary to UTI. Mental status waxes and wanes therefore patient continues to need SNF placement however difficult to place.  Consultants Neurology Psychiatry  Procedures  None  Antibiotics   Anti-infectives (From admission, onward)   Start     Dose/Rate Route Frequency Ordered Stop   12/14/18 0100  cefTRIAXone (ROCEPHIN) 1 g in sodium chloride 0.9 % 100 mL IVPB  Status:  Discontinued     1 g 200 mL/hr over 30 Minutes Intravenous Every 24 hours 12/14/18 0010 12/17/18 1027      Subjective:   Marie Woods seen and examined today. No complaints today. Feels tired. No complaints today. Denies chest pain, shortness of breath, abdominal pain, N/V/D/C.   Objective:   Vitals:   03/08/19 0829 03/08/19 1731 03/08/19 2341 03/09/19 0813  BP: 101/63 104/75 128/66 112/77  Pulse: 74 86 76 67  Resp: 19 18 20    Temp: 97.9 F (36.6 C) 98.5 F (36.9 C) 98.3 F (36.8 C) 98.5 F (36.9 C)  TempSrc: Oral     SpO2: 100% 100% 100% 99%  Weight:      Height:       No intake or output data in the 24 hours ending 03/09/19 0842 Filed Weights   12/15/18 0101 02/22/19 0359  Weight: 98.4 kg 67.1 kg   Exam  General: Well developed, well nourished, NAD, appears stated age  HEENT: NCAT, mucous membranes moist.   Cardiovascular: S1 S2 auscultated, RRR, no murmur  Respiratory: Clear to auscultation bilaterally   Abdomen: Soft, nontender, nondistended, + bowel sounds  Extremities: warm dry without cyanosis clubbing  or edema  Neuro: AAOx3, nonfocal  Psych: Appropriate mood and affect  Data Reviewed: I  have personally reviewed following labs and imaging studies  CBC: Recent Labs  Lab 03/03/19 1110 03/05/19 1026  WBC 6.4 7.7  NEUTROABS 3.0 3.6  HGB 13.0 12.2  HCT 39.7 37.0  MCV 88.8 86.7  PLT 358 361   Basic Metabolic Panel: Recent Labs  Lab 03/03/19 1110 03/05/19 1026 03/06/19 0331  NA 142 144  --   K 4.0 4.1  --   CL 108 110  --   CO2 23 24  --   GLUCOSE 89 109*  --   BUN 11 12  --   CREATININE 0.57 0.63  --   CALCIUM 9.9 9.6  --   MG 2.0 2.0  --   PHOS 4.3  --  4.4   GFR: Estimated Creatinine Clearance: 71.8 mL/min (by C-G formula based on SCr of 0.63 mg/dL). Liver Function Tests: Recent Labs  Lab 03/03/19 1110 03/05/19 1026  AST 18 18  ALT 14 14  ALKPHOS 39 39  BILITOT 0.6 0.9  PROT 7.1 7.0  ALBUMIN 3.6 3.5   No results for input(s): LIPASE, AMYLASE in the last 168 hours. No results for input(s): AMMONIA in the last 168 hours. Coagulation Profile: No results for input(s): INR, PROTIME in the last 168 hours. Cardiac Enzymes: No results for input(s): CKTOTAL, CKMB, CKMBINDEX, TROPONINI in the last 168 hours. BNP (last 3 results) No results for input(s): PROBNP in the last 8760 hours. HbA1C: No results for input(s): HGBA1C in the last 72 hours. CBG: No results for input(s): GLUCAP in the last 168 hours. Lipid Profile: No results for input(s): CHOL, HDL, LDLCALC, TRIG, CHOLHDL, LDLDIRECT in the last 72 hours. Thyroid Function Tests: No results for input(s): TSH, T4TOTAL, FREET4, T3FREE, THYROIDAB in the last 72 hours. Anemia Panel: No results for input(s): VITAMINB12, FOLATE, FERRITIN, TIBC, IRON, RETICCTPCT in the last 72 hours. Urine analysis:    Component Value Date/Time   COLORURINE AMBER (A) 12/13/2018 2218   APPEARANCEUR HAZY (A) 12/13/2018 2218   LABSPEC 1.027 12/13/2018 2218   PHURINE 5.0 12/13/2018 2218   GLUCOSEU NEGATIVE 12/13/2018 2218   HGBUR SMALL (A) 12/13/2018 2218   HGBUR small 06/28/2009 1504   BILIRUBINUR SMALL (A)  12/13/2018 2218   KETONESUR 5 (A) 12/13/2018 2218   PROTEINUR 30 (A) 12/13/2018 2218   UROBILINOGEN 1.0 09/28/2014 1426   NITRITE NEGATIVE 12/13/2018 2218   LEUKOCYTESUR TRACE (A) 12/13/2018 2218   Sepsis Labs: @LABRCNTIP (procalcitonin:4,lacticidven:4)  ) Recent Results (from the past 240 hour(s))  SARS CORONAVIRUS 2 (TAT 6-24 HRS) Nasopharyngeal Nasopharyngeal Swab     Status: None   Collection Time: 03/05/19  7:24 PM   Specimen: Nasopharyngeal Swab  Result Value Ref Range Status   SARS Coronavirus 2 NEGATIVE NEGATIVE Final    Comment: (NOTE) SARS-CoV-2 target nucleic acids are NOT DETECTED. The SARS-CoV-2 RNA is generally detectable in upper and lower respiratory specimens during the acute phase of infection. Negative results do not preclude SARS-CoV-2 infection, do not rule out co-infections with other pathogens, and should not be used as the sole basis for treatment or other patient management decisions. Negative results must be combined with clinical observations, patient history, and epidemiological information. The expected result is Negative. Fact Sheet for Patients: 03/07/19 Fact Sheet for Healthcare Providers: HairSlick.no This test is not yet approved or cleared by the [place]ir[place]ir.com FDA and  has been authorized for detection and/or diagnosis of SARS-CoV-2 by  FDA under an Emergency Use Authorization (EUA). This EUA will remain  in effect (meaning this test can be used) for the duration of the COVID-19 declaration under Section 56 4(b)(1) of the Act, 21 U.S.C. section 360bbb-3(b)(1), unless the authorization is terminated or revoked sooner. Performed at [facility], [place] [address], [place], [place] [address]       Radiology Studies: No results found.   Scheduled Meds: . amLODipine  5 mg Oral Daily  . enoxaparin (LOVENOX) injection  40 mg Subcutaneous Q24H  . feeding supplement (ENSURE  ENLIVE)  237 mL Oral BID BM  . folic acid  1 mg Oral Daily  . ketoconazole   Topical BID  . multivitamin with minerals  1 tablet Oral Daily  . nicotine  14 mg Transdermal Daily  . polyethylene glycol  17 g Oral BID  . senna-docusate  1 tablet Oral BID  . thiamine  100 mg Oral Daily   Continuous Infusions:   LOS: 86 days   Time Spent in minutes   20 minutes  Ricki Vanhandel D.O. on 03/09/2019 at 8:42 AM  Between 7am to 7pm - Please see pager noted on [email or web address]  After 7pm go to [email or web address]  And look for the night coverage person covering for me after hours  [facility] Office  [phone]

## 2019-03-09 NOTE — Plan of Care (Signed)
  Problem: Education: Goal: Knowledge of General Education information will improve Description: Including pain rating scale, medication(s)/side effects and non-pharmacologic comfort measures Outcome: Progressing   Problem: Clinical Measurements: Goal: Will remain free from infection Outcome: Progressing   Problem: Activity: Goal: Risk for activity intolerance will decrease Outcome: Progressing   Problem: Nutrition: Goal: Adequate nutrition will be maintained Outcome: Progressing   Problem: Pain Managment: Goal: General experience of comfort will improve Outcome: Progressing   Problem: Safety: Goal: Ability to remain free from injury will improve Outcome: Progressing   Problem: Skin Integrity: Goal: Risk for impaired skin integrity will decrease Outcome: Progressing   

## 2019-03-09 NOTE — Progress Notes (Signed)
Pt pulled out PIV. IV team consulted; PIV's have been USG.

## 2019-03-10 NOTE — Progress Notes (Signed)
PROGRESS NOTE    Marie Woods  WUX:[id number] DOB: November 07, 1955 DOA: 12/13/2018 PCP: Ladell Pier, MD   Brief Narrative:  64 year old female with history of asthma, hypertension, alcohol abuse was brought to the hospital for confusion and altered mental status. Found to have UTI. MRI suggestive of possible Warnicke's encephalopathy, patient was treated with thiamine. PT and OT have recommended SNF. Patient has been difficult to place.  Assessment & Plan   Severe sepsis secondary to UTI -Present on admission -Urine culture showed multiple species, repeat culture showed less than 10,000 colonies -Was initially treated with IV fluids and Rocephin -Sepsis resolved -Chest x-ray was negative for infection -Currently hemodynamically stable  Acute metabolic encephalopathy/possible Wernicke's encephalopathy -CT head showed mild atrophy neck changes without acute intercranial normality -MRI brain showed symmetric diffusion weighted and T2/FLAIR hyperintense signal abnormality within the medial thalami, favoring Wernicke's encephalopathy -Alcohol level was less than 10 on admission Urine drug screen was negative- -Patient completed IV thiamine and is currently on oral thiamine -Continue folic acid, multivitamin -Patient will need outpatient follow-up with neurology -? Underlying dementia with memory issues as mental status has been fluctuating. Although today patient does appear to be alert and oriented x3 -Of note, patient is on Haldol and Ativan as needed for extreme agitation; hydroxyzine for anxiety  Essential hypertension -Continue Norvasc  Alcohol abuse -Treated with CIWA protocol -No alcohol withdrawal noted -Continue thiamine, folate, multivitamin  Tobacco abuse -Nicotine patch  Generalized weakness -PT/OT recommending SNF -Deviously consulted -Patient needs supervision given her short-term memory impairment.   Normocytic anemia -Stable, no signs of  bleeding -Continue to monitor CBC  Chest tightness/shortness of breath and dizziness -Transient, have now resolved -Vitals obtained, patient not orthostatic  Constipation -Continue bowel regimen  DVT Prophylaxis Lovenox  Code Status: Full  Family Communication: None at bedside  Disposition Plan: Admitted. Patient presented from home with acute metabolic encephalopathy and sepsis secondary to UTI. Mental status waxes and wanes therefore patient continues to need SNF placement however difficult to place.  Consultants Neurology Psychiatry  Procedures  None  Antibiotics   Anti-infectives (From admission, onward)   Start     Dose/Rate Route Frequency Ordered Stop   12/14/18 0100  cefTRIAXone (ROCEPHIN) 1 g in sodium chloride 0.9 % 100 mL IVPB  Status:  Discontinued     1 g 200 mL/hr over 30 Minutes Intravenous Every 24 hours 12/14/18 0010 12/17/18 2536      Subjective:   Marie Woods seen and examined today.  Patient with no complaints today.  Feels tired.  Denies chest pain or shortness of breath, abdominal pain, dizziness or headache.    Objective:   Vitals:   03/09/19 1545 03/09/19 1947 03/10/19 0647 03/10/19 0720  BP: 109/72 123/90 113/78 118/86  Pulse: 74 79 76 77  Resp:   18 16  Temp: 98.1 F (36.7 C) 97.8 F (36.6 C) 98 F (36.7 C) 98 F (36.7 C)  TempSrc:  Oral Oral   SpO2: 100% 95% 99% 99%  Weight:      Height:       No intake or output data in the 24 hours ending 03/10/19 0919 Filed Weights   12/15/18 0101 02/22/19 0359  Weight: 98.4 kg 67.1 kg   Exam  General: Well developed, well nourished, NAD, appears stated age  89: NCAT, mucous membranes moist.   Cardiovascular: S1 S2 auscultated, RRR, no murmur  Respiratory: Clear to auscultation bilaterally  Abdomen: Soft, nontender, nondistended, + bowel sounds  Extremities:  warm dry without cyanosis clubbing or edema  Neuro: AAOx3, nonfocal  Psych: Appropriate mood and affect, pleasant    Data Reviewed: I have personally reviewed following labs and imaging studies  CBC: Recent Labs  Lab 03/03/19 1110 03/05/19 1026  WBC 6.4 7.7  NEUTROABS 3.0 3.6  HGB 13.0 12.2  HCT 39.7 37.0  MCV 88.8 86.7  PLT 358 361   Basic Metabolic Panel: Recent Labs  Lab 03/03/19 1110 03/05/19 1026 03/06/19 0331  NA 142 144  --   K 4.0 4.1  --   CL 108 110  --   CO2 23 24  --   GLUCOSE 89 109*  --   BUN 11 12  --   CREATININE 0.57 0.63  --   CALCIUM 9.9 9.6  --   MG 2.0 2.0  --   PHOS 4.3  --  4.4   GFR: Estimated Creatinine Clearance: 71.8 mL/min (by C-G formula based on SCr of 0.63 mg/dL). Liver Function Tests: Recent Labs  Lab 03/03/19 1110 03/05/19 1026  AST 18 18  ALT 14 14  ALKPHOS 39 39  BILITOT 0.6 0.9  PROT 7.1 7.0  ALBUMIN 3.6 3.5   No results for input(s): LIPASE, AMYLASE in the last 168 hours. No results for input(s): AMMONIA in the last 168 hours. Coagulation Profile: No results for input(s): INR, PROTIME in the last 168 hours. Cardiac Enzymes: No results for input(s): CKTOTAL, CKMB, CKMBINDEX, TROPONINI in the last 168 hours. BNP (last 3 results) No results for input(s): PROBNP in the last 8760 hours. HbA1C: No results for input(s): HGBA1C in the last 72 hours. CBG: No results for input(s): GLUCAP in the last 168 hours. Lipid Profile: No results for input(s): CHOL, HDL, LDLCALC, TRIG, CHOLHDL, LDLDIRECT in the last 72 hours. Thyroid Function Tests: No results for input(s): TSH, T4TOTAL, FREET4, T3FREE, THYROIDAB in the last 72 hours. Anemia Panel: No results for input(s): VITAMINB12, FOLATE, FERRITIN, TIBC, IRON, RETICCTPCT in the last 72 hours. Urine analysis:    Component Value Date/Time   COLORURINE AMBER (A) 12/13/2018 2218   APPEARANCEUR HAZY (A) 12/13/2018 2218   LABSPEC 1.027 12/13/2018 2218   PHURINE 5.0 12/13/2018 2218   GLUCOSEU NEGATIVE 12/13/2018 2218   HGBUR SMALL (A) 12/13/2018 2218   HGBUR small 06/28/2009 1504    BILIRUBINUR SMALL (A) 12/13/2018 2218   KETONESUR 5 (A) 12/13/2018 2218   PROTEINUR 30 (A) 12/13/2018 2218   UROBILINOGEN 1.0 09/28/2014 1426   NITRITE NEGATIVE 12/13/2018 2218   LEUKOCYTESUR TRACE (A) 12/13/2018 2218   Sepsis Labs: @LABRCNTIP (procalcitonin:4,lacticidven:4)  ) Recent Results (from the past 240 hour(s))  SARS CORONAVIRUS 2 (TAT 6-24 HRS) Nasopharyngeal Nasopharyngeal Swab     Status: None   Collection Time: 03/05/19  7:24 PM   Specimen: Nasopharyngeal Swab  Result Value Ref Range Status   SARS Coronavirus 2 NEGATIVE NEGATIVE Final    Comment: (NOTE) SARS-CoV-2 target nucleic acids are NOT DETECTED. The SARS-CoV-2 RNA is generally detectable in upper and lower respiratory specimens during the acute phase of infection. Negative results do not preclude SARS-CoV-2 infection, do not rule out co-infections with other pathogens, and should not be used as the sole basis for treatment or other patient management decisions. Negative results must be combined with clinical observations, patient history, and epidemiological information. The expected result is Negative. Fact Sheet for Patients: 03/07/19 Fact Sheet for Healthcare Providers: HairSlick.no This test is not yet approved or cleared by the quier[employer]ir[employer]ir.com and  has been authorized  for detection and/or diagnosis of SARS-CoV-2 by FDA under an Emergency Use Authorization (EUA). This EUA will remain  in effect (meaning this test can be used) for the duration of the COVID-19 declaration under Section 56 4(b)(1) of the Act, 21 U.S.C. section 360bbb-3(b)(1), unless the authorization is terminated or revoked sooner. Performed at [facility], [address] [place], [place] [address]       Radiology Studies: No results found.   Scheduled Meds: . amLODipine  5 mg Oral Daily  . enoxaparin (LOVENOX) injection  40 mg Subcutaneous Q24H  . feeding  supplement (ENSURE ENLIVE)  237 mL Oral BID BM  . folic acid  1 mg Oral Daily  . ketoconazole   Topical BID  . multivitamin with minerals  1 tablet Oral Daily  . nicotine  14 mg Transdermal Daily  . polyethylene glycol  17 g Oral BID  . senna-docusate  1 tablet Oral BID  . thiamine  100 mg Oral Daily   Continuous Infusions:   LOS: 87 days   Time Spent in minutes   20 minutes  Chanz Cahall D.O. on 03/10/2019 at 9:19 AM  Between 7am to 7pm - Please see pager noted on [email or web address]  After 7pm go to [email or web address]  And look for the night coverage person covering for me after hours  [facility] Office  [phone]

## 2019-03-11 NOTE — Progress Notes (Signed)
PROGRESS NOTE    Marie Woods  NIO:[id number] DOB: Sep 05, 1955 DOA: 12/13/2018 PCP: Marcine Matar, MD   Brief Narrative:  64 year old female with history of asthma, hypertension, alcohol abuse was brought to the hospital for confusion and altered mental status. Found to have UTI. MRI suggestive of possible Warnicke's encephalopathy, patient was treated with thiamine. PT and OT have recommended SNF. Patient has been difficult to place.  Assessment & Plan   Severe sepsis secondary to UTI -Present on admission -Urine culture showed multiple species, repeat culture showed less than 10,000 colonies -Was initially treated with IV fluids and Rocephin -Sepsis resolved -Chest x-ray was negative for infection -Currently hemodynamically stable  Acute metabolic encephalopathy/possible Wernicke's encephalopathy -CT head showed mild atrophy neck changes without acute intercranial normality -MRI brain showed symmetric diffusion weighted and T2/FLAIR hyperintense signal abnormality within the medial thalami, favoring Wernicke's encephalopathy -Alcohol level was less than 10 on admission Urine drug screen was negative- -Patient completed IV thiamine and is currently on oral thiamine -Continue folic acid, multivitamin -Patient will need outpatient follow-up with neurology -? Underlying dementia with memory issues as mental status has been fluctuating. Although today patient does appear to be alert and oriented x3 -Of note, patient is on Haldol and Ativan as needed for extreme agitation; hydroxyzine for anxiety  Essential hypertension -Continue Norvasc  Alcohol abuse -Treated with CIWA protocol -No alcohol withdrawal noted -Continue thiamine, folate, multivitamin  Tobacco abuse -Nicotine patch  Generalized weakness -PT/OT recommending SNF -Deviously consulted -Patient needs supervision given her short-term memory impairment.   Normocytic anemia -Stable, no signs of  bleeding -Continue to monitor CBC  Chest tightness/shortness of breath and dizziness -Transient, have now resolved -Vitals obtained, patient not orthostatic  Constipation -Continue bowel regimen  DVT Prophylaxis Lovenox  Code Status: Full  Family Communication: None at bedside  Disposition Plan: Admitted. Patient presented from home with acute metabolic encephalopathy and sepsis secondary to UTI. Mental status waxes and wanes therefore patient continues to need SNF placement however difficult to place.  Consultants Neurology Psychiatry  Procedures  None  Antibiotics   Anti-infectives (From admission, onward)   Start     Dose/Rate Route Frequency Ordered Stop   12/14/18 0100  cefTRIAXone (ROCEPHIN) 1 g in sodium chloride 0.9 % 100 mL IVPB  Status:  Discontinued     1 g 200 mL/hr over 30 Minutes Intravenous Every 24 hours 12/14/18 0010 12/17/18 8182      Subjective:   [name] seen and examined today.  Patient with no complaints today. Denies current chest pain, shortness of breath, abdominal pain, N/V/D/C.  Objective:   Vitals:   03/10/19 1623 03/10/19 2053 03/11/19 0608 03/11/19 0805  BP: 110/83 117/83 110/78 114/80  Pulse: 88 74 70 76  Resp: 18  18 19   Temp: 98.7 F (37.1 C) 98.2 F (36.8 C) 98.3 F (36.8 C) 98 F (36.7 C)  TempSrc:   Oral   SpO2: 100% 98% 100% 100%  Weight:      Height:       No intake or output data in the 24 hours ending 03/11/19 0905 Filed Weights   12/15/18 0101 02/22/19 0359  Weight: 98.4 kg 67.1 kg   Exam  General: Well developed, well nourished, NAD, appears stated age  HEENT: NCAT, mucous membranes moist.   Cardiovascular: S1 S2 auscultated, RRR  Respiratory: Clear to auscultation bilaterally  Abdomen: Soft, nontender, nondistended, + bowel sounds  Extremities: warm dry without cyanosis clubbing or edema  Neuro: AAOx3,  nonfocal  Psych: Appropriate mood and affect  Data Reviewed: I have personally reviewed  following labs and imaging studies  CBC: Recent Labs  Lab 03/05/19 1026  WBC 7.7  NEUTROABS 3.6  HGB 12.2  HCT 37.0  MCV 86.7  PLT 361   Basic Metabolic Panel: Recent Labs  Lab 03/05/19 1026 03/06/19 0331  NA 144  --   K 4.1  --   CL 110  --   CO2 24  --   GLUCOSE 109*  --   BUN 12  --   CREATININE 0.63  --   CALCIUM 9.6  --   MG 2.0  --   PHOS  --  4.4   GFR: Estimated Creatinine Clearance: 71.8 mL/min (by C-G formula based on SCr of 0.63 mg/dL). Liver Function Tests: Recent Labs  Lab 03/05/19 1026  AST 18  ALT 14  ALKPHOS 39  BILITOT 0.9  PROT 7.0  ALBUMIN 3.5   No results for input(s): LIPASE, AMYLASE in the last 168 hours. No results for input(s): AMMONIA in the last 168 hours. Coagulation Profile: No results for input(s): INR, PROTIME in the last 168 hours. Cardiac Enzymes: No results for input(s): CKTOTAL, CKMB, CKMBINDEX, TROPONINI in the last 168 hours. BNP (last 3 results) No results for input(s): PROBNP in the last 8760 hours. HbA1C: No results for input(s): HGBA1C in the last 72 hours. CBG: No results for input(s): GLUCAP in the last 168 hours. Lipid Profile: No results for input(s): CHOL, HDL, LDLCALC, TRIG, CHOLHDL, LDLDIRECT in the last 72 hours. Thyroid Function Tests: No results for input(s): TSH, T4TOTAL, FREET4, T3FREE, THYROIDAB in the last 72 hours. Anemia Panel: No results for input(s): VITAMINB12, FOLATE, FERRITIN, TIBC, IRON, RETICCTPCT in the last 72 hours. Urine analysis:    Component Value Date/Time   COLORURINE AMBER (A) 12/13/2018 2218   APPEARANCEUR HAZY (A) 12/13/2018 2218   LABSPEC 1.027 12/13/2018 2218   PHURINE 5.0 12/13/2018 2218   GLUCOSEU NEGATIVE 12/13/2018 2218   HGBUR SMALL (A) 12/13/2018 2218   HGBUR small 06/28/2009 1504   BILIRUBINUR SMALL (A) 12/13/2018 2218   KETONESUR 5 (A) 12/13/2018 2218   PROTEINUR 30 (A) 12/13/2018 2218   UROBILINOGEN 1.0 09/28/2014 1426   NITRITE NEGATIVE 12/13/2018 2218    LEUKOCYTESUR TRACE (A) 12/13/2018 2218   Sepsis Labs: @LABRCNTIP (procalcitonin:4,lacticidven:4)  ) Recent Results (from the past 240 hour(s))  SARS CORONAVIRUS 2 (TAT 6-24 HRS) Nasopharyngeal Nasopharyngeal Swab     Status: None   Collection Time: 03/05/19  7:24 PM   Specimen: Nasopharyngeal Swab  Result Value Ref Range Status   SARS Coronavirus 2 NEGATIVE NEGATIVE Final    Comment: (NOTE) SARS-CoV-2 target nucleic acids are NOT DETECTED. The SARS-CoV-2 RNA is generally detectable in upper and lower respiratory specimens during the acute phase of infection. Negative results do not preclude SARS-CoV-2 infection, do not rule out co-infections with other pathogens, and should not be used as the sole basis for treatment or other patient management decisions. Negative results must be combined with clinical observations, patient history, and epidemiological information. The expected result is Negative. Fact Sheet for Patients: 03/07/19 Fact Sheet for Healthcare Providers: HairSlick.no This test is not yet approved or cleared by the [employer]ir[employer] FDA and  has been authorized for detection and/or diagnosis of SARS-CoV-2 by FDA under an Emergency Use Authorization (EUA). This EUA will remain  in effect (meaning this test can be used) for the duration of the COVID-19 declaration under Section 56 4(b)(1) of the  Act, 21 U.S.C. section 360bbb-3(b)(1), unless the authorization is terminated or revoked sooner. Performed at [facility], [place] [address], [place], [place] [address]       Radiology Studies: No results found.   Scheduled Meds: . amLODipine  5 mg Oral Daily  . enoxaparin (LOVENOX) injection  40 mg Subcutaneous Q24H  . feeding supplement (ENSURE ENLIVE)  237 mL Oral BID BM  . folic acid  1 mg Oral Daily  . ketoconazole   Topical BID  . multivitamin with minerals  1 tablet Oral Daily  . nicotine  14 mg  Transdermal Daily  . polyethylene glycol  17 g Oral BID  . senna-docusate  1 tablet Oral BID  . thiamine  100 mg Oral Daily   Continuous Infusions:   LOS: 88 days   Time Spent in minutes   20 minutes  Stephen Turnbaugh D.O. on 03/11/2019 at 9:05 AM  Between 7am to 7pm - Please see pager noted on [email or web address]  After 7pm go to [email or web address]  And look for the night coverage person covering for me after hours  [facility] Office  [phone]

## 2019-03-12 NOTE — Progress Notes (Signed)
PROGRESS NOTE    Marie Woods  FUX:[id number] DOB: December 15, 1955 DOA: 12/13/2018 PCP: Ladell Pier, MD   Brief Narrative:  64 year old female with history of asthma, hypertension, alcohol abuse was brought to the hospital for confusion and altered mental status. Found to have UTI. MRI suggestive of possible Warnicke's encephalopathy, patient was treated with thiamine. PT and OT have recommended SNF. Patient has been difficult to place.  Assessment & Plan   Severe sepsis secondary to UTI -Present on admission -Urine culture showed multiple species, repeat culture showed less than 10,000 colonies -Was initially treated with IV fluids and Rocephin -Sepsis resolved -Chest x-ray was negative for infection -Currently hemodynamically stable  Acute metabolic encephalopathy/possible Wernicke's encephalopathy -CT head showed mild atrophy neck changes without acute intercranial normality -MRI brain showed symmetric diffusion weighted and T2/FLAIR hyperintense signal abnormality within the medial thalami, favoring Wernicke's encephalopathy -Alcohol level was less than 10 on admission -Urine drug screen was negative -Patient completed IV thiamine and is currently on oral thiamine -Continue folic acid, multivitamin -Patient will need outpatient follow-up with neurology -? Underlying dementia with memory issues as mental status has been fluctuating. Although today patient does appear to be alert and oriented x3 -Of note, patient is on Haldol and Ativan as needed for extreme agitation; hydroxyzine for anxiety  Essential hypertension -Continue Norvasc  Alcohol abuse -Treated with CIWA protocol -No alcohol withdrawal noted -Continue thiamine, folate, multivitamin  Tobacco abuse -Nicotine patch  Generalized weakness -PT/OT recommending SNF -Deviously consulted -Patient needs supervision given her short-term memory impairment.   Normocytic anemia -Stable, no signs of  bleeding -Continue to monitor CBC  Chest tightness/shortness of breath and dizziness -Transient, have now resolved -Vitals obtained, patient not orthostatic  Constipation -Continue bowel regimen  DVT Prophylaxis Lovenox  Code Status: Full  Family Communication: None at bedside  Disposition Plan: Admitted. Patient presented from home with acute metabolic encephalopathy and sepsis secondary to UTI. Mental status waxes and wanes therefore patient continues to need SNF placement however difficult to place.  Consultants Neurology Psychiatry  Procedures  None  Antibiotics   Anti-infectives (From admission, onward)   Start     Dose/Rate Route Frequency Ordered Stop   12/14/18 0100  cefTRIAXone (ROCEPHIN) 1 g in sodium chloride 0.9 % 100 mL IVPB  Status:  Discontinued     1 g 200 mL/hr over 30 Minutes Intravenous Every 24 hours 12/14/18 0010 12/17/18 0254      Subjective:   Marie Woods seen and examined today.  Patient with no complaints today.  Denies chest pain, shortness of breath, abdominal pain, nausea or vomiting, diarrhea constipation, dizziness or headache. Objective:   Vitals:   03/11/19 0805 03/11/19 1651 03/11/19 2254 03/12/19 0801  BP: 114/80 (!) 153/115 97/63 102/82  Pulse: 76 87 76 73  Resp: 19 19  19   Temp: 98 F (36.7 C) 97.7 F (36.5 C) 97.7 F (36.5 C) 98.5 F (36.9 C)  TempSrc:   Oral   SpO2: 100% 100% 99% 98%  Weight:      Height:       No intake or output data in the 24 hours ending 03/12/19 0944 Filed Weights   12/15/18 0101 02/22/19 0359  Weight: 98.4 kg 67.1 kg   Exam  General: Well developed, well nourished, NAD, appears stated age  HEENT: NCAT, mucous membranes moist.   Cardiovascular: S1 S2 auscultated, RRR  Respiratory: Clear to auscultation bilaterally   Abdomen: Soft, nontender, nondistended, + bowel sounds  Extremities: warm dry  without cyanosis clubbing or edema  Neuro: AAOx2, nonfocal  Psych: Appropriate mood and  affect  Data Reviewed: I have personally reviewed following labs and imaging studies  CBC: Recent Labs  Lab 03/05/19 1026  WBC 7.7  NEUTROABS 3.6  HGB 12.2  HCT 37.0  MCV 86.7  PLT 361   Basic Metabolic Panel: Recent Labs  Lab 03/05/19 1026 03/06/19 0331  NA 144  --   K 4.1  --   CL 110  --   CO2 24  --   GLUCOSE 109*  --   BUN 12  --   CREATININE 0.63  --   CALCIUM 9.6  --   MG 2.0  --   PHOS  --  4.4   GFR: Estimated Creatinine Clearance: 71.8 mL/min (by C-G formula based on SCr of 0.63 mg/dL). Liver Function Tests: Recent Labs  Lab 03/05/19 1026  AST 18  ALT 14  ALKPHOS 39  BILITOT 0.9  PROT 7.0  ALBUMIN 3.5   No results for input(s): LIPASE, AMYLASE in the last 168 hours. No results for input(s): AMMONIA in the last 168 hours. Coagulation Profile: No results for input(s): INR, PROTIME in the last 168 hours. Cardiac Enzymes: No results for input(s): CKTOTAL, CKMB, CKMBINDEX, TROPONINI in the last 168 hours. BNP (last 3 results) No results for input(s): PROBNP in the last 8760 hours. HbA1C: No results for input(s): HGBA1C in the last 72 hours. CBG: No results for input(s): GLUCAP in the last 168 hours. Lipid Profile: No results for input(s): CHOL, HDL, LDLCALC, TRIG, CHOLHDL, LDLDIRECT in the last 72 hours. Thyroid Function Tests: No results for input(s): TSH, T4TOTAL, FREET4, T3FREE, THYROIDAB in the last 72 hours. Anemia Panel: No results for input(s): VITAMINB12, FOLATE, FERRITIN, TIBC, IRON, RETICCTPCT in the last 72 hours. Urine analysis:    Component Value Date/Time   COLORURINE AMBER (A) 12/13/2018 2218   APPEARANCEUR HAZY (A) 12/13/2018 2218   LABSPEC 1.027 12/13/2018 2218   PHURINE 5.0 12/13/2018 2218   GLUCOSEU NEGATIVE 12/13/2018 2218   HGBUR SMALL (A) 12/13/2018 2218   HGBUR small 06/28/2009 1504   BILIRUBINUR SMALL (A) 12/13/2018 2218   KETONESUR 5 (A) 12/13/2018 2218   PROTEINUR 30 (A) 12/13/2018 2218   UROBILINOGEN 1.0  09/28/2014 1426   NITRITE NEGATIVE 12/13/2018 2218   LEUKOCYTESUR TRACE (A) 12/13/2018 2218   Sepsis Labs: @LABRCNTIP (procalcitonin:4,lacticidven:4)  ) Recent Results (from the past 240 hour(s))  SARS CORONAVIRUS 2 (TAT 6-24 HRS) Nasopharyngeal Nasopharyngeal Swab     Status: None   Collection Time: 03/05/19  7:24 PM   Specimen: Nasopharyngeal Swab  Result Value Ref Range Status   SARS Coronavirus 2 NEGATIVE NEGATIVE Final    Comment: (NOTE) SARS-CoV-2 target nucleic acids are NOT DETECTED. The SARS-CoV-2 RNA is generally detectable in upper and lower respiratory specimens during the acute phase of infection. Negative results do not preclude SARS-CoV-2 infection, do not rule out co-infections with other pathogens, and should not be used as the sole basis for treatment or other patient management decisions. Negative results must be combined with clinical observations, patient history, and epidemiological information. The expected result is Negative. Fact Sheet for Patients: 03/07/19 Fact Sheet for Healthcare Providers: HairSlick.no This test is not yet approved or cleared by the [place] FDA and  has been authorized for detection and/or diagnosis of SARS-CoV-2 by FDA under an Emergency Use Authorization (EUA). This EUA will remain  in effect (meaning this test can be used) for the duration of the  COVID-19 declaration under Section 56 4(b)(1) of the Act, 21 U.S.C. section 360bbb-3(b)(1), unless the authorization is terminated or revoked sooner. Performed at [facility], [address], [place], [place] [address]       Radiology Studies: No results found.   Scheduled Meds: . amLODipine  5 mg Oral Daily  . enoxaparin (LOVENOX) injection  40 mg Subcutaneous Q24H  . feeding supplement (ENSURE ENLIVE)  237 mL Oral BID BM  . folic acid  1 mg Oral Daily  . ketoconazole   Topical BID  . multivitamin  with minerals  1 tablet Oral Daily  . nicotine  14 mg Transdermal Daily  . polyethylene glycol  17 g Oral BID  . senna-docusate  1 tablet Oral BID  . thiamine  100 mg Oral Daily   Continuous Infusions:   LOS: 89 days   Time Spent in minutes   20 minutes  Oluwatomiwa Kinyon D.O. on 03/12/2019 at 9:44 AM  Between 7am to 7pm - Please see pager noted on [email or web address]  After 7pm go to [email or web address]  And look for the night coverage person covering for me after hours  [facility] Office  [phone]

## 2019-03-13 NOTE — Progress Notes (Signed)
PROGRESS NOTE    Marie Woods  OMV:[id number] DOB: [birth year] DOA: 12/13/2018 PCP: Marcine Matar, MD   Brief Narrative:  64 year old female with history of asthma, hypertension, alcohol abuse was brought to the hospital for confusion and altered mental status. Found to have UTI. MRI suggestive of possible Warnicke's encephalopathy, patient was treated with thiamine. PT and OT have recommended SNF. Patient has been difficult to place.  Assessment & Plan   Severe sepsis secondary to UTI -Present on admission -Urine culture showed multiple species, repeat culture showed less than 10,000 colonies -Was initially treated with IV fluids and Rocephin -Sepsis resolved -Chest x-ray was negative for infection -Currently hemodynamically stable  Acute metabolic encephalopathy/possible Wernicke's encephalopathy -CT head showed mild atrophy neck changes without acute intercranial normality -MRI brain showed symmetric diffusion weighted and T2/FLAIR hyperintense signal abnormality within the medial thalami, favoring Wernicke's encephalopathy -Alcohol level was less than 10 on admission -Urine drug screen was negative -Patient completed IV thiamine and is currently on oral thiamine -Continue folic acid, multivitamin -Patient will need outpatient follow-up with neurology -? Underlying dementia with memory issues as mental status has been fluctuating. Although today patient does appear to be alert and oriented x3 -Of note, patient is on Haldol and Ativan as needed for extreme agitation; hydroxyzine for anxiety  Essential hypertension -Continue Norvasc  Alcohol abuse -Treated with CIWA protocol -No alcohol withdrawal noted -Continue thiamine, folate, multivitamin  Tobacco abuse -Nicotine patch  Generalized weakness -PT/OT recommending SNF -Deviously consulted -Patient needs supervision given her short-term memory impairment.   Normocytic anemia -Stable, no signs of  bleeding -Continue to monitor CBC  Chest tightness/shortness of breath and dizziness -Transient, have now resolved -Vitals obtained, patient not orthostatic  Constipation -Continue bowel regimen  DVT Prophylaxis Lovenox  Code Status: Full  Family Communication: None at bedside  Disposition Plan: Admitted. Patient presented from home with acute metabolic encephalopathy and sepsis secondary to UTI. Mental status waxes and wanes therefore patient continues to need SNF placement however difficult to place.  Consultants Neurology Psychiatry  Procedures  None  Antibiotics   Anti-infectives (From admission, onward)   Start     Dose/Rate Route Frequency Ordered Stop   12/14/18 0100  cefTRIAXone (ROCEPHIN) 1 g in sodium chloride 0.9 % 100 mL IVPB  Status:  Discontinued     1 g 200 mL/hr over 30 Minutes Intravenous Every 24 hours 12/14/18 0010 12/17/18 6283      Subjective:   Marie Woods seen and examined today.  Patient up and in the bathroom. No complaints today. Denies chest pain, shortness of breath, abdominal pain, N/V/D/C, dizziness, headache.  Objective:   Vitals:   03/12/19 1722 03/12/19 1724 03/12/19 1955 03/12/19 2000  BP: (!) 169/149 (!) 162/90 110/80 124/80  Pulse: 86 88 79 83  Resp: 19  16 18   Temp: 98.4 F (36.9 C)  98.6 F (37 C) 98.3 F (36.8 C)  TempSrc:    Oral  SpO2: 100% 100% 100% 100%  Weight:      Height:       No intake or output data in the 24 hours ending 03/13/19 0835 Filed Weights   12/15/18 0101 02/22/19 0359  Weight: 98.4 kg 67.1 kg   Exam  General: Well developed, well nourished, NAD, appears stated age  HEENT: NCAT,  mucous membranes moist.   Extremities: warm dry without cyanosis clubbing or edema  Neuro: AAOx3, nonfocal  Psych: Appropriate mood and affect   Data Reviewed: I have personally reviewed  following labs and imaging studies  CBC: No results for input(s): WBC, NEUTROABS, HGB, HCT, MCV, PLT in the last 168  hours. Basic Metabolic Panel: No results for input(s): NA, K, CL, CO2, GLUCOSE, BUN, CREATININE, CALCIUM, MG, PHOS in the last 168 hours. GFR: Estimated Creatinine Clearance: 71.8 mL/min (by C-G formula based on SCr of 0.63 mg/dL). Liver Function Tests: No results for input(s): AST, ALT, ALKPHOS, BILITOT, PROT, ALBUMIN in the last 168 hours. No results for input(s): LIPASE, AMYLASE in the last 168 hours. No results for input(s): AMMONIA in the last 168 hours. Coagulation Profile: No results for input(s): INR, PROTIME in the last 168 hours. Cardiac Enzymes: No results for input(s): CKTOTAL, CKMB, CKMBINDEX, TROPONINI in the last 168 hours. BNP (last 3 results) No results for input(s): PROBNP in the last 8760 hours. HbA1C: No results for input(s): HGBA1C in the last 72 hours. CBG: No results for input(s): GLUCAP in the last 168 hours. Lipid Profile: No results for input(s): CHOL, HDL, LDLCALC, TRIG, CHOLHDL, LDLDIRECT in the last 72 hours. Thyroid Function Tests: No results for input(s): TSH, T4TOTAL, FREET4, T3FREE, THYROIDAB in the last 72 hours. Anemia Panel: No results for input(s): VITAMINB12, FOLATE, FERRITIN, TIBC, IRON, RETICCTPCT in the last 72 hours. Urine analysis:    Component Value Date/Time   COLORURINE AMBER (A) 12/13/2018 2218   APPEARANCEUR HAZY (A) 12/13/2018 2218   LABSPEC 1.027 12/13/2018 2218   PHURINE 5.0 12/13/2018 2218   GLUCOSEU NEGATIVE 12/13/2018 2218   HGBUR SMALL (A) 12/13/2018 2218   HGBUR small 06/28/2009 1504   BILIRUBINUR SMALL (A) 12/13/2018 2218   KETONESUR 5 (A) 12/13/2018 2218   PROTEINUR 30 (A) 12/13/2018 2218   UROBILINOGEN 1.0 09/28/2014 1426   NITRITE NEGATIVE 12/13/2018 2218   LEUKOCYTESUR TRACE (A) 12/13/2018 2218   Sepsis Labs: @LABRCNTIP (procalcitonin:4,lacticidven:4)  ) Recent Results (from the past 240 hour(s))  SARS CORONAVIRUS 2 (TAT 6-24 HRS) Nasopharyngeal Nasopharyngeal Swab     Status: None   Collection Time: 03/05/19   7:24 PM   Specimen: Nasopharyngeal Swab  Result Value Ref Range Status   SARS Coronavirus 2 NEGATIVE NEGATIVE Final    Comment: (NOTE) SARS-CoV-2 target nucleic acids are NOT DETECTED. The SARS-CoV-2 RNA is generally detectable in upper and lower respiratory specimens during the acute phase of infection. Negative results do not preclude SARS-CoV-2 infection, do not rule out co-infections with other pathogens, and should not be used as the sole basis for treatment or other patient management decisions. Negative results must be combined with clinical observations, patient history, and epidemiological information. The expected result is Negative. Fact Sheet for Patients: SugarRoll.be Fact Sheet for Healthcare Providers: https://www.woods-mathews.com/ This test is not yet approved or cleared by the [place] FDA and  has been authorized for detection and/or diagnosis of SARS-CoV-2 by FDA under an Emergency Use Authorization (EUA). This EUA will remain  in effect (meaning this test can be used) for the duration of the COVID-19 declaration under Section 56 4(b)(1) of the Act, 21 U.S.C. section 360bbb-3(b)(1), unless the authorization is terminated or revoked sooner. Performed at [facility], [place] [address] [place], [place] [address]       Radiology Studies: No results found.   Scheduled Meds: . amLODipine  5 mg Oral Daily  . enoxaparin (LOVENOX) injection  40 mg Subcutaneous Q24H  . feeding supplement (ENSURE ENLIVE)  237 mL Oral BID BM  . folic acid  1 mg Oral Daily  . ketoconazole   Topical BID  .  multivitamin with minerals  1 tablet Oral Daily  . nicotine  14 mg Transdermal Daily  . polyethylene glycol  17 g Oral BID  . senna-docusate  1 tablet Oral BID  . thiamine  100 mg Oral Daily   Continuous Infusions:   LOS: 90 days   Time Spent in minutes   20 minutes  Shuntay Everetts D.O. on 03/13/2019 at 8:35 AM  Between  7am to 7pm - Please see pager noted on [email or web address]  After 7pm go to [email or web address]  And look for the night coverage person covering for me after hours  [facility] Office  [phone]

## 2019-03-13 NOTE — Plan of Care (Signed)
  Problem: Education: Goal: Knowledge of General Education information will improve Description: Including pain rating scale, medication(s)/side effects and non-pharmacologic comfort measures Outcome: Progressing   Problem: Clinical Measurements: Goal: Ability to maintain clinical measurements within normal limits will improve Outcome: Progressing Goal: Will remain free from infection Outcome: Progressing Goal: Respiratory complications will improve Outcome: Progressing Goal: Cardiovascular complication will be avoided Outcome: Progressing   Problem: Activity: Goal: Risk for activity intolerance will decrease Outcome: Progressing   Problem: Nutrition: Goal: Adequate nutrition will be maintained Outcome: Progressing   Problem: Coping: Goal: Level of anxiety will decrease Outcome: Progressing   Problem: Elimination: Goal: Will not experience complications related to bowel motility Outcome: Progressing Goal: Will not experience complications related to urinary retention Outcome: Progressing   Problem: Pain Managment: Goal: General experience of comfort will improve Outcome: Progressing   Problem: Safety: Goal: Ability to remain free from injury will improve Outcome: Progressing   Problem: Skin Integrity: Goal: Risk for impaired skin integrity will decrease Outcome: Progressing   

## 2019-03-14 NOTE — Progress Notes (Signed)
PROGRESS NOTE    Marie Woods  HCW:[id number] DOB: 1955-08-14 DOA: 12/13/2018 PCP: Marcine Matar, MD   Brief Narrative:  64 year old female with history of asthma, hypertension, alcohol abuse was brought to the hospital for confusion and altered mental status. Found to have UTI. MRI suggestive of possible Warnicke's encephalopathy, patient was treated with thiamine. PT and OT have recommended SNF. Patient has been difficult to place.  Assessment & Plan   Severe sepsis secondary to UTI -Present on admission -Urine culture showed multiple species, repeat culture showed less than 10,000 colonies -Was initially treated with IV fluids and Rocephin -Sepsis resolved -Chest x-ray was negative for infection -Currently hemodynamically stable  Acute metabolic encephalopathy/possible Wernicke's encephalopathy -CT head showed mild atrophy neck changes without acute intercranial normality -MRI brain showed symmetric diffusion weighted and T2/FLAIR hyperintense signal abnormality within the medial thalami, favoring Wernicke's encephalopathy -Alcohol level was less than 10 on admission -Urine drug screen was negative -Patient completed IV thiamine and is currently on oral thiamine -Continue folic acid, multivitamin -Patient will need outpatient follow-up with neurology -? Underlying dementia with memory issues as mental status has been fluctuating. Although today patient does appear to be alert and oriented x3 -Of note, patient is on Haldol and Ativan as needed for extreme agitation; hydroxyzine for anxiety  Essential hypertension -Continue Norvasc  Alcohol abuse -Treated with CIWA protocol -No alcohol withdrawal noted -Continue thiamine, folate, multivitamin  Tobacco abuse -Nicotine patch  Generalized weakness -PT/OT recommending SNF -Deviously consulted -Patient needs supervision given her short-term memory impairment.   Normocytic anemia -Stable, no signs of bleeding  -Continue to monitor CBC  Chest tightness/shortness of breath and dizziness -Transient, have now resolved -Vitals obtained, patient not orthostatic  Constipation -Continue bowel regimen  DVT Prophylaxis Lovenox  Code Status: Full  Family Communication: None at bedside  Disposition Plan: Admitted. Patient presented from home with acute metabolic encephalopathy and sepsis secondary to UTI. Mental status waxes and wanes therefore patient continues to need SNF placement however difficult to place.  Consultants Neurology Psychiatry  Procedures  None  Antibiotics   Anti-infectives (From admission, onward)   Start     Dose/Rate Route Frequency Ordered Stop   12/14/18 0100  cefTRIAXone (ROCEPHIN) 1 g in sodium chloride 0.9 % 100 mL IVPB  Status:  Discontinued     1 g 200 mL/hr over 30 Minutes Intravenous Every 24 hours 12/14/18 0010 12/17/18 1761      Subjective:   [name] seen and examined today.  Patient with no complaints today. Denies chest pain, shortness of breath, abdominal pain, N/V/D/C, dizziness, headache.   Objective:   Vitals:   03/12/19 2000 03/13/19 0852 03/13/19 1634 03/13/19 2246  BP: 124/80 120/78 123/82 94/68  Pulse: 83 73 81 74  Resp: 18 16  20   Temp: 98.3 F (36.8 C) 98.6 F (37 C) 98.2 F (36.8 C) 98.4 F (36.9 C)  TempSrc: Oral     SpO2: 100% 99% 100% 94%  Weight:      Height:       No intake or output data in the 24 hours ending 03/14/19 0828 Filed Weights   12/15/18 0101 02/22/19 0359  Weight: 98.4 kg 67.1 kg   Exam  General: Well developed, well nourished, NAD, appears stated age  HEENT: NCAT,  mucous membranes moist.   Extremities: warm dry without cyanosis clubbing or edema  Neuro: AAOx3, nonfocal  Psych: Appropriate mood and affect   Data Reviewed: I have personally reviewed following labs  and imaging studies  CBC: No results for input(s): WBC, NEUTROABS, HGB, HCT, MCV, PLT in the last 168 hours. Basic Metabolic  Panel: No results for input(s): NA, K, CL, CO2, GLUCOSE, BUN, CREATININE, CALCIUM, MG, PHOS in the last 168 hours. GFR: Estimated Creatinine Clearance: 71.8 mL/min (by C-G formula based on SCr of 0.63 mg/dL). Liver Function Tests: No results for input(s): AST, ALT, ALKPHOS, BILITOT, PROT, ALBUMIN in the last 168 hours. No results for input(s): LIPASE, AMYLASE in the last 168 hours. No results for input(s): AMMONIA in the last 168 hours. Coagulation Profile: No results for input(s): INR, PROTIME in the last 168 hours. Cardiac Enzymes: No results for input(s): CKTOTAL, CKMB, CKMBINDEX, TROPONINI in the last 168 hours. BNP (last 3 results) No results for input(s): PROBNP in the last 8760 hours. HbA1C: No results for input(s): HGBA1C in the last 72 hours. CBG: No results for input(s): GLUCAP in the last 168 hours. Lipid Profile: No results for input(s): CHOL, HDL, LDLCALC, TRIG, CHOLHDL, LDLDIRECT in the last 72 hours. Thyroid Function Tests: No results for input(s): TSH, T4TOTAL, FREET4, T3FREE, THYROIDAB in the last 72 hours. Anemia Panel: No results for input(s): VITAMINB12, FOLATE, FERRITIN, TIBC, IRON, RETICCTPCT in the last 72 hours. Urine analysis:    Component Value Date/Time   COLORURINE AMBER (A) 12/13/2018 2218   APPEARANCEUR HAZY (A) 12/13/2018 2218   LABSPEC 1.027 12/13/2018 2218   PHURINE 5.0 12/13/2018 2218   GLUCOSEU NEGATIVE 12/13/2018 2218   HGBUR SMALL (A) 12/13/2018 2218   HGBUR small 06/28/2009 1504   BILIRUBINUR SMALL (A) 12/13/2018 2218   KETONESUR 5 (A) 12/13/2018 2218   PROTEINUR 30 (A) 12/13/2018 2218   UROBILINOGEN 1.0 09/28/2014 1426   NITRITE NEGATIVE 12/13/2018 2218   LEUKOCYTESUR TRACE (A) 12/13/2018 2218   Sepsis Labs: @LABRCNTIP (procalcitonin:4,lacticidven:4)  ) Recent Results (from the past 240 hour(s))  SARS CORONAVIRUS 2 (TAT 6-24 HRS) Nasopharyngeal Nasopharyngeal Swab     Status: None   Collection Time: 03/05/19  7:24 PM   Specimen:  Nasopharyngeal Swab  Result Value Ref Range Status   SARS Coronavirus 2 NEGATIVE NEGATIVE Final    Comment: (NOTE) SARS-CoV-2 target nucleic acids are NOT DETECTED. The SARS-CoV-2 RNA is generally detectable in upper and lower respiratory specimens during the acute phase of infection. Negative results do not preclude SARS-CoV-2 infection, do not rule out co-infections with other pathogens, and should not be used as the sole basis for treatment or other patient management decisions. Negative results must be combined with clinical observations, patient history, and epidemiological information. The expected result is Negative. Fact Sheet for Patients: 03/07/19 Fact Sheet for Healthcare Providers: HairSlick.no This test is not yet approved or cleared by the quierodirigir.com FDA and  has been authorized for detection and/or diagnosis of SARS-CoV-2 by FDA under an Emergency Use Authorization (EUA). This EUA will remain  in effect (meaning this test can be used) for the duration of the COVID-19 declaration under Section 56 4(b)(1) of the Act, 21 U.S.C. section 360bbb-3(b)(1), unless the authorization is terminated or revoked sooner. Performed at [facility], [address] [place], [place]       Radiology Studies: No results found.   Scheduled Meds: . amLODipine  5 mg Oral Daily  . enoxaparin (LOVENOX) injection  40 mg Subcutaneous Q24H  . feeding supplement (ENSURE ENLIVE)  237 mL Oral BID BM  . folic acid  1 mg Oral Daily  . ketoconazole   Topical BID  . multivitamin  with minerals  1 tablet Oral Daily  . nicotine  14 mg Transdermal Daily  . polyethylene glycol  17 g Oral BID  . senna-docusate  1 tablet Oral BID  . thiamine  100 mg Oral Daily   Continuous Infusions:   LOS: 91 days   Time Spent in minutes   20 minutes  Oriana Horiuchi D.O. on 03/14/2019 at 8:28 AM  Between 7am to 7pm - Please see  pager noted on [email or web address]  After 7pm go to [email or web address]  And look for the night coverage person covering for me after hours  [facility] Office  [phone]

## 2019-03-14 NOTE — TOC Progression Note (Addendum)
Transition of Care [facility]) - Progression Note    Patient Details  Name: Marie Woods MRN: [id number] Date of Birth: 1955-07-21  Transition of Care [facility]) CM/SW Contact  Mearl Latin, LCSW Phone Number: 03/14/2019, 5:54 PM  Clinical Narrative:    ALF search continues.   Expected Discharge Plan: Skilled Nursing Facility Barriers to Discharge: No SNF bed, SNF Pending payor source - LOG  Expected Discharge Plan and Services Expected Discharge Plan: Skilled Nursing Facility In-house Referral: Clinical Social Work Discharge Planning Services: NA Post Acute Care Choice: Skilled Nursing Facility Living arrangements for the past 2 months: Apartment                 DME Arranged: N/A DME Agency: NA                   Social Determinants of Health (SDOH) Interventions    Readmission Risk Interventions No flowsheet data found.

## 2019-03-14 NOTE — TOC Progression Note (Signed)
Transition of Care [facility]) - Progression Note    Patient Details  Name: Marie Woods MRN: [id number] Date of Birth: [birth year]  [facility]) CM/SW Contact  Mearl Latin, LCSW Phone Number: 03/14/2019, 5:54 PM  Clinical Narrative:    CSW received disability paperwork for patient to sign. Will complete with patient and send back to [facility].    Expected Discharge Plan: Skilled Nursing Facility Barriers to Discharge: No SNF bed, SNF Pending payor source - LOG  Expected Discharge Plan and Services Expected Discharge Plan: Skilled Nursing Facility In-house Referral: Clinical Social Work Discharge Planning Services: NA Post Acute Care Choice: Skilled Nursing Facility Living arrangements for the past 2 months: Apartment                 DME Arranged: N/A DME Agency: NA                   Social Determinants of Health (SDOH) Interventions    Readmission Risk Interventions No flowsheet data found.

## 2019-03-15 LAB — CBC
HCT: 38.5 % (ref 36.0–46.0)
Hemoglobin: 12.9 g/dL (ref 12.0–15.0)
MCH: 29 pg (ref 26.0–34.0)
MCHC: 33.5 g/dL (ref 30.0–36.0)
MCV: 86.5 fL (ref 80.0–100.0)
Platelets: 309 10*3/uL (ref 150–400)
RBC: 4.45 MIL/uL (ref 3.87–5.11)
RDW: 14.4 % (ref 11.5–15.5)
WBC: 7.3 10*3/uL (ref 4.0–10.5)
nRBC: 0 % (ref 0.0–0.2)

## 2019-03-15 LAB — BASIC METABOLIC PANEL
Anion gap: 11 (ref 5–15)
BUN: 14 mg/dL (ref 8–23)
CO2: 24 mmol/L (ref 22–32)
Calcium: 9.6 mg/dL (ref 8.9–10.3)
Chloride: 103 mmol/L (ref 98–111)
Creatinine, Ser: 0.71 mg/dL (ref 0.44–1.00)
GFR calc Af Amer: >60 mL/min (ref >60)
GFR calc non Af Amer: >60 mL/min (ref >60)
Glucose, Bld: 92 mg/dL (ref 70–99)
Potassium: 5 mmol/L (ref 3.5–5.1)
Sodium: 138 mmol/L (ref 135–145)

## 2019-03-15 MED ORDER — CALCIUM CARBONATE ANTACID 500 MG PO CHEW
1.0000 | CHEWABLE_TABLET | Freq: Three times a day (TID) | ORAL | Status: DC | PRN
  Administered 2019-03-15 – 2019-05-01 (×4): 200 mg via ORAL
  Filled 2019-03-15 (×4): qty 1

## 2019-03-15 NOTE — Progress Notes (Signed)
Occupational Therapy Treatment Patient Details Name: Marie Woods MRN: [id number] DOB: [birth year] Today's Date: 03/15/2019    History of present illness Patient is a 64 year old female with medical history significant of asthma, hypertension, alcohol abuse who was brought in by EMS after neighbors called in stating that patient is confused and altered. Patient admitted Severe Sepsis with Acute metabolic encephalopathy secondary to UTI.    OT comments  Patient seated EOB upon entry, agreeable to OT.  Reviewed compensatory techniques worked on in previous session including making lists, establishing routines, having items in particular PLACES (to aide in the establishment with routine), keeping a calendar; pt with NO recall of these strategies.  Able to use list writing with minimal cueing, but requires mod cueing to complete 3 step tasks; when brushing teeth able to problem solve need to ask for more toothpaste, but poor recall of need once reaching nursing station (thinking toothbrush vs toothpaste). Patient also reports completing ADL routine (grooming, bathing every am), but then reports did not complete this am.  Made "daily schedule" for patient to refer back to in order to ensure completion of basic ADLs daily.  Pt eager for dc, but will need 24/7 support.    Follow Up Recommendations  SNF;Supervision/Assistance - 24 hour(ALF?)    Equipment Recommendations  3 in 1 bedside commode    Recommendations for Other Services      Precautions / Restrictions Precautions Precautions: Fall Restrictions Weight Bearing Restrictions: No       Mobility Bed Mobility               General bed mobility comments: EOB upon entry   Transfers Overall transfer level: Needs assistance Equipment used: Rolling walker (2 wheeled) Transfers: Sit to/from Stand Sit to Stand: Supervision         General transfer comment: Supervision for safety    Balance Overall balance assessment: Needs  assistance Sitting-balance support: No upper extremity supported;Feet supported Sitting balance-Leahy Scale: Normal     Standing balance support: No upper extremity supported;Single extremity supported;During functional activity Standing balance-Leahy Scale: Fair Standing balance comment: supervision - GREATLY improved with RW for dynamic                            ADL either performed or assessed with clinical judgement   ADL Overall ADL's : Needs assistance/impaired     Grooming: Modified independent;Standing Grooming Details (indicate cue type and reason): pt unable to find toothpaste, completed mobilty to ask for toothpaste but once reached desk pt unable to recall what she needed to ask for and required mod cueing              Lower Body Dressing: Supervision/safety;Sit to/from stand Lower Body Dressing Details (indicate cue type and reason): cueing to adjust socks for safety  Toilet Transfer: Supervision/safety;RW Toilet Transfer Details (indicate cue type and reason): Supervision for safety         Functional mobility during ADLs: Supervision/safety;Rolling walker General ADL Comments: focused on compensatory memory strategies to optimize recall during tasks     Vision       Perception     Praxis      Cognition Arousal/Alertness: Awake/alert Behavior During Therapy: WFL for tasks assessed/performed Overall Cognitive Status: History of cognitive impairments - at baseline Area of Impairment: Attention;Memory;Following commands;Safety/judgement;Awareness;Problem solving                   Current Attention  Level: Selective Memory: Decreased short-term memory Following Commands: Follows one step commands consistently;Follows one step commands with increased time;Follows multi-step commands inconsistently Safety/Judgement: Decreased awareness of safety;Decreased awareness of deficits Awareness: Emergent Problem Solving: Requires verbal  cues;Difficulty sequencing General Comments: challenging ST memory, problem solving and attention; reviewed techniques introduced in previous sessions with NO recall        Exercises     Shoulder Instructions       General Comments patient required cueing to use RW for safety, patient "pulling" RW behind her once returning to room and requires cueing; engaged in 3 step trail making task with mod cueing; patient using compensatory strategies, but poor recall of coming back to list for recall; with verbal cueing able to engage in task, and initates asking staff for assistance for problem solving     Pertinent Vitals/ Pain       Pain Assessment: No/denies pain  Home Living                                          Prior Functioning/Environment              Frequency  Min 1X/week        Progress Toward Goals  OT Goals(current goals can now be found in the care plan section)  Progress towards OT goals: Progressing toward goals  Acute Rehab OT Goals Patient Stated Goal: return to [facility]  Plan Discharge plan remains appropriate;Frequency remains appropriate    Co-evaluation                 AM-PAC OT "6 Clicks" Daily Activity     Outcome Measure   Help from another person eating meals?: None Help from another person taking care of personal grooming?: A Little Help from another person toileting, which includes using toliet, bedpan, or urinal?: A Little Help from another person bathing (including washing, rinsing, drying)?: A Little Help from another person to put on and taking off regular upper body clothing?: None Help from another person to put on and taking off regular lower body clothing?: A Little 6 Click Score: 20    End of Session Equipment Utilized During Treatment: Rolling walker  OT Visit Diagnosis: Unsteadiness on feet (R26.81);Other abnormalities of gait and mobility (R26.89);Muscle weakness (generalized) (M62.81);Other symptoms and  signs involving cognitive function   Activity Tolerance Patient tolerated treatment well   Patient Left with call bell/phone within reach;Other (comment)(seated EOB )   Nurse Communication Mobility status        Time: 1610-9604 OT Time Calculation (min): 22 min  Charges: OT General Charges $OT Visit: 1 Visit OT Treatments $Self Care/Home Management : 8-22 mins  Jolaine Artist, OT Acute Rehabilitation Services Pager [phone] Office [phone]     Delight Stare 03/15/2019, 3:27 PM

## 2019-03-15 NOTE — Progress Notes (Signed)
PROGRESS NOTE  Marie Woods  WLN:[id number] DOB: 02-04-1955 DOA: 12/13/2018 PCP: Marcine Matar, MD   Brief Narrative: 64 year old female with history of asthma, hypertension, alcohol abuse was brought to the hospital for confusion and altered mental status in November 2020. Found to have severe sepsis due to UTI which was treated. Confusion continued and MRI was suggestive Wernicke's encephalopathy which has been treated with thiamine supplementation. PT and OT have recommended SNF disposition which has been pursued, though the patient is difficult to place.  Assessment & Plan: Principal Problem:   AMS (altered mental status) Active Problems:   History of alcohol use   TOBACCO ABUSE   Essential hypertension   Confusion   Acute metabolic encephalopathy   Agitation   Acute lower UTI   Sepsis (HCC)   Sepsis secondary to UTI (HCC)   Normocytic anemia  Severe sepsis secondary to UTI: treated with IVF, ceftriaxone, repeat urine culture <10k insignificant growth. CXR negative for infection. This has resolved.   Acute metabolic encephalopathy/possible Wernicke's encephalopathy: Patient may be confabulating, supporting this diagnosis which is also supported by neuroimaging with CT showing atrophy and MRI revealing symmetric diffusion weighted and T2/FLAIR hyperintense signal abnormality within the medial thalami. - s/p IV thiamine, continuing oral thiamine, folic acid, multivitamin - Patient will need outpatient follow-up with neurology and consideration of neuropsychiatry evaluation.  - Continue Haldol and Ativan as needed for extreme agitation; hydroxyzine for anxiety  Essential hypertension - Continue norvasc  Alcohol abuse: EtOH level negative on admission, UDS negative.  - No evidence of withdrawal, CIWA DC'ed - Continue thiamine, folate, multivitamin  Tobacco abuse -Nicotine patch  Generalized weakness:  - PT/OT recommending SNF  Normocytic anemia, possibly due to  nutritional deficiencies: Resolved.  - Check CBC intermittently.  Chest tightness/shortness of breath and dizziness: Transient, have now resolved. Patient not orthostatic - Monitor clinically  Constipation - Continue bowel regimen  DVT prophylaxis: Lovenox Code Status: Full Family Communication: None Disposition Plan: Patient requires supervision given severity of short term recall deficit and is awaiting placement which is difficult. Discussed with CSW today.   Consultants:   None  Procedures:   None  Antimicrobials:  Ceftriaxone   Subjective: No complaints.   Objective: Vitals:   03/14/19 1606 03/14/19 2100 03/14/19 2311 03/15/19 0650  BP:  114/76 125/85 100/68  Pulse:  87 75 75  Resp:  18 20 19   Temp: 98.1 F (36.7 C) 98 F (36.7 C) 98.4 F (36.9 C) 98.8 F (37.1 C)  TempSrc:  Oral  Oral  SpO2:  99% 100% 100%  Weight:      Height:       No intake or output data in the 24 hours ending 03/15/19 1600 Filed Weights   12/15/18 0101 02/22/19 0359  Weight: 98.4 kg 67.1 kg    Gen: Chronically ill-appearing female in no distress  Pulm: Non-labored breathing. Clear.  Neuro: Alert, oriented to place, person and year, not month or day/date. No focal neurological deficits. Psych: Judgement and insight appear impaired. Mood & affect appropriate.   Data Reviewed: I have personally reviewed following labs and imaging studies  CBC: Recent Labs  Lab 03/15/19 0225  WBC 7.3  HGB 12.9  HCT 38.5  MCV 86.5  PLT 309   Basic Metabolic Panel: Recent Labs  Lab 03/15/19 0225  NA 138  K 5.0  CL 103  CO2 24  GLUCOSE 92  BUN 14  CREATININE 0.71  CALCIUM 9.6   Urine analysis:  Component Value Date/Time   COLORURINE AMBER (A) 12/13/2018 2218   APPEARANCEUR HAZY (A) 12/13/2018 2218   LABSPEC 1.027 12/13/2018 2218   PHURINE 5.0 12/13/2018 2218   GLUCOSEU NEGATIVE 12/13/2018 2218   HGBUR SMALL (A) 12/13/2018 2218   HGBUR small 06/28/2009 1504    BILIRUBINUR SMALL (A) 12/13/2018 2218   KETONESUR 5 (A) 12/13/2018 2218   PROTEINUR 30 (A) 12/13/2018 2218   UROBILINOGEN 1.0 09/28/2014 1426   NITRITE NEGATIVE 12/13/2018 2218   LEUKOCYTESUR TRACE (A) 12/13/2018 2218   Recent Results (from the past 240 hour(s))  SARS CORONAVIRUS 2 (TAT 6-24 HRS) Nasopharyngeal Nasopharyngeal Swab     Status: None   Collection Time: 03/05/19  7:24 PM   Specimen: Nasopharyngeal Swab  Result Value Ref Range Status   SARS Coronavirus 2 NEGATIVE NEGATIVE Final    Comment: (NOTE) SARS-CoV-2 target nucleic acids are NOT DETECTED. The SARS-CoV-2 RNA is generally detectable in upper and lower respiratory specimens during the acute phase of infection. Negative results do not preclude SARS-CoV-2 infection, do not rule out co-infections with other pathogens, and should not be used as the sole basis for treatment or other patient management decisions. Negative results must be combined with clinical observations, patient history, and epidemiological information. The expected result is Negative. Fact Sheet for Patients: SugarRoll.be Fact Sheet for Healthcare Providers: https://www.woods-mathews.com/ This test is not yet approved or cleared by the [place] FDA and  has been authorized for detection and/or diagnosis of SARS-CoV-2 by FDA under an Emergency Use Authorization (EUA). This EUA will remain  in effect (meaning this test can be used) for the duration of the COVID-19 declaration under Section 56 4(b)(1) of the Act, 21 U.S.C. section 360bbb-3(b)(1), unless the authorization is terminated or revoked sooner. Performed at [facility], [place] [address], [place], [place] [address]       LOS: 92 days   Time spent: 15 minutes.  Patrecia Pour, MD [facility] [email or web address] 03/15/2019, 4:00 PM

## 2019-03-16 NOTE — Progress Notes (Signed)
PROGRESS NOTE  Marie Woods  MGQ:[id number] DOB: 1955/10/28 DOA: 12/13/2018 PCP: Ladell Pier, MD   Brief Narrative: 64 year old female with history of asthma, hypertension, alcohol abuse was brought to the hospital for confusion and altered mental status in November 2020. Found to have severe sepsis due to UTI which was treated. Confusion continued and MRI was suggestive Wernicke's encephalopathy which has been treated with thiamine supplementation. PT and OT have recommended SNF disposition which has been pursued, though the patient is difficult to place.  Assessment & Plan: Principal Problem:   AMS (altered mental status) Active Problems:   History of alcohol use   TOBACCO ABUSE   Essential hypertension   Confusion   Acute metabolic encephalopathy   Agitation   Acute lower UTI   Sepsis ([place])   Sepsis secondary to UTI ([place])   Normocytic anemia  Severe sepsis secondary to UTI: treated with IVF, ceftriaxone, repeat urine culture <10k insignificant growth. CXR negative for infection. This has resolved.   Acute metabolic encephalopathy/possible Wernicke's encephalopathy: Patient may be confabulating, supporting this diagnosis which is also supported by neuroimaging with CT showing atrophy and MRI revealing symmetric diffusion weighted and T2/FLAIR hyperintense signal abnormality within the medial thalami. - s/p IV thiamine, continuing oral thiamine, folic acid, multivitamin - Patient will need outpatient follow-up with neurology and consideration of neuropsychiatry evaluation.  - Continue Haldol and Ativan as needed for extreme agitation; hydroxyzine for anxiety  Essential hypertension - Continue norvasc  Alcohol abuse: EtOH level negative on admission, UDS negative.  - No evidence of withdrawal, CIWA DC'ed - Continue thiamine, folate, multivitamin  Tobacco abuse -Nicotine patch, will continue to avoid precipitating withdrawal  Generalized weakness:  - PT/OT  recommending SNF  Normocytic anemia, possibly due to nutritional deficiencies: Resolved.  - Check CBC intermittently.  Chest tightness/shortness of breath and dizziness: Transient, have now resolved. Patient not orthostatic - Monitor clinically  Constipation - Continue bowel regimen  DVT prophylaxis: Lovenox Code Status: Full Family Communication: None Disposition Plan: Patient requires supervision given severity of short term recall deficit and is awaiting placement which is difficult. She is medically stable for discharge.  Consultants:   None  Procedures:   None  Antimicrobials:  Ceftriaxone   Subjective: Slept well, still eating ok, had no pain or other complaints.  Objective: Vitals:   03/15/19 0650 03/15/19 1710 03/15/19 2300 03/16/19 0741  BP: 100/68 117/82 100/73 99/65  Pulse: 75 82 80 70  Resp: 19 19  19   Temp: 98.8 F (37.1 C) 97.8 F (36.6 C) 99 F (37.2 C) 98.7 F (37.1 C)  TempSrc: Oral Oral Oral   SpO2: 100% 100% 97% 99%  Weight:      Height:        Intake/Output Summary (Last 24 hours) at 03/16/2019 1120 Last data filed at 03/16/2019 0800 Gross per 24 hour  Intake 240 ml  Output --  Net 240 ml   Filed Weights   12/15/18 0101 02/22/19 0359  Weight: 98.4 kg 67.1 kg   Gen: Chronically ill-appearing female in no distress HEENT: Poor dentition Pulm: Nonlabored breathing room air. Clear. CV: Regular rate and rhythm. No dependent edema. Neuro: Alert, incompletely oriented but conversant without focal deficits on limited exam.  Data Reviewed: I have personally reviewed following labs and imaging studies  CBC: Recent Labs  Lab 03/15/19 0225  WBC 7.3  HGB 12.9  HCT 38.5  MCV 86.5  PLT 267   Basic Metabolic Panel: Recent Labs  Lab 03/15/19 0225  NA 138  K 5.0  CL 103  CO2 24  GLUCOSE 92  BUN 14  CREATININE 0.71  CALCIUM 9.6   Urine analysis:    Component Value Date/Time   COLORURINE AMBER (A) 12/13/2018 2218    APPEARANCEUR HAZY (A) 12/13/2018 2218   LABSPEC 1.027 12/13/2018 2218   PHURINE 5.0 12/13/2018 2218   GLUCOSEU NEGATIVE 12/13/2018 2218   HGBUR SMALL (A) 12/13/2018 2218   HGBUR small 06/28/2009 1504   BILIRUBINUR SMALL (A) 12/13/2018 2218   KETONESUR 5 (A) 12/13/2018 2218   PROTEINUR 30 (A) 12/13/2018 2218   UROBILINOGEN 1.0 09/28/2014 1426   NITRITE NEGATIVE 12/13/2018 2218   LEUKOCYTESUR TRACE (A) 12/13/2018 2218     LOS: 93 days   Time spent: 15 minutes.  Tyrone Nine, MD [facility] [email or web address] 03/16/2019, 11:20 AM

## 2019-03-16 NOTE — TOC Progression Note (Signed)
Transition of Care [facility]) - Progression Note    Patient Details  Name: Marie Woods MRN: [id number] Date of Birth: 1955-04-07  [facility]) CM/SW Contact  Mearl Latin, LCSW Phone Number: 03/16/2019, 12:00 PM  Clinical Narrative:    CSW faxed referral to [facility] for review.    Expected Discharge Plan: Skilled Nursing Facility Barriers to Discharge: No SNF bed, SNF Pending payor source - LOG  Expected Discharge Plan and Services Expected Discharge Plan: Skilled Nursing Facility In-house Referral: Clinical Social Work Discharge Planning Services: NA Post Acute Care Choice: Skilled Nursing Facility Living arrangements for the past 2 months: Apartment                 DME Arranged: N/A DME Agency: NA                   Social Determinants of Health (SDOH) Interventions    Readmission Risk Interventions No flowsheet data found.

## 2019-03-17 NOTE — Progress Notes (Signed)
PROGRESS NOTE  Marie Woods  POE:[id number] DOB: 1955-04-07 DOA: 12/13/2018 PCP: Marcine Matar, MD   Brief Narrative: 64 year old female with history of asthma, hypertension, alcohol abuse was brought to the hospital for confusion and altered mental status in November 2020. Found to have severe sepsis due to UTI which was treated. Confusion continued and MRI was suggestive Wernicke's encephalopathy which has been treated with thiamine supplementation. PT and OT have recommended SNF disposition which has been pursued, though the patient is difficult to place.  Assessment & Plan: Principal Problem:   AMS (altered mental status) Active Problems:   History of alcohol use   TOBACCO ABUSE   Essential hypertension   Confusion   Acute metabolic encephalopathy   Agitation   Acute lower UTI   Sepsis ([facility])   Sepsis secondary to UTI ([facility])   Normocytic anemia  Severe sepsis secondary to UTI: treated with IVF, ceftriaxone, repeat urine culture <10k insignificant growth. CXR negative for infection. This has resolved.   Acute metabolic encephalopathy, suspected Wernicke's encephalopathy: Patient may be confabulating, supporting this diagnosis which is also supported by neuroimaging with CT showing atrophy and MRI revealing symmetric diffusion weighted and T2/FLAIR hyperintense signal abnormality within the medial thalami. - s/p IV thiamine, continuing oral thiamine, folic acid, multivitamin - Patient will need outpatient follow-up with neurology and consideration of neuropsychiatry evaluation.  - Continue Haldol and Ativan as needed for extreme agitation; hydroxyzine for anxiety  Essential hypertension - Continue norvasc  Alcohol abuse: EtOH level negative on admission, UDS negative.  - No evidence of withdrawal, CIWA DC'ed - Continue thiamine, folate, multivitamin  Tobacco abuse -Nicotine patch, will continue to avoid precipitating withdrawal  Generalized weakness:  - PT/OT  recommending SNF  Normocytic anemia, possibly due to nutritional deficiencies: Resolved.  - Check CBC intermittently.  Chest tightness/shortness of breath and dizziness: Transient, have now resolved. Patient not orthostatic - Monitor clinically  Constipation - Continue bowel regimen  DVT prophylaxis: Lovenox Code Status: Full Family Communication: None Disposition Plan: Patient requires supervision given severity of short term recall deficit and is awaiting placement which is difficult, continuing SNF search per CSW. She is medically stable for discharge.  Consultants:   None  Procedures:   None  Antimicrobials:  Ceftriaxone   Subjective: No complaints, denies pain or dyspnea. No events of past 24 hours.  Objective: Vitals:   03/16/19 2000 03/16/19 2121 03/17/19 0644 03/17/19 0738  BP: 108/70 108/67 115/87 131/88  Pulse: 68 73 87 73  Resp: 18 14 18 16   Temp: 98.2 F (36.8 C) 98.3 F (36.8 C) 99.1 F (37.3 C) 98.5 F (36.9 C)  TempSrc: Oral Oral Oral Oral  SpO2: 100% 97% 98% 100%  Weight:      Height:        Intake/Output Summary (Last 24 hours) at 03/17/2019 1140 Last data filed at 03/17/2019 0843 Gross per 24 hour  Intake 240 ml  Output -  Net 240 ml   Filed Weights   12/15/18 0101 02/22/19 0359  Weight: 98.4 kg 67.1 kg   Gen: Chronically ill-appearing female in no distress HEENT: Edentulous, no ulcers CV: RRR Pulm: Nonlabored, clear Neuro: Alert, incompletely oriented, MAEW  CBC: Recent Labs  Lab 03/15/19 0225  WBC 7.3  HGB 12.9  HCT 38.5  MCV 86.5  PLT 309   Basic Metabolic Panel: Recent Labs  Lab 03/15/19 0225  NA 138  K 5.0  CL 103  CO2 24  GLUCOSE 92  BUN 14  CREATININE  0.71  CALCIUM 9.6     LOS: 94 days   Time spent: 15 minutes.  Patrecia Pour, MD [facility] [email or web address] 03/17/2019, 11:40 AM

## 2019-03-18 NOTE — Progress Notes (Signed)
PROGRESS NOTE  Marie Woods  [id number]:[id number] DOB: Sep 01, 1955 DOA: 12/13/2018 PCP: Marcine Matar, MD   Brief Narrative: 64 year old female with history of asthma, hypertension, alcohol abuse was brought to the hospital for confusion and altered mental status in November 2020. Found to have severe sepsis due to UTI which was treated. Confusion continued and MRI was suggestive Wernicke's encephalopathy which has been treated with thiamine supplementation.  Patient has been seen by PT and OT - recommended SNF-awaiting placement    Assessment & Plan: Principal Problem:   AMS (altered mental status) Active Problems:   History of alcohol use   TOBACCO ABUSE   Essential hypertension   Confusion   Acute metabolic encephalopathy   Agitation   Acute lower UTI   Sepsis ([facility])   Sepsis secondary to UTI ([facility])   Normocytic anemia  Severe sepsis -Resolved -Felt to be secondary to urinary etiology/UTI-urine culture negative -Chest x-ray negative for acute infiltrate -Treated with supportive care including IV fluids and antibiotics ceftriaxone   Acute metabolic encephalopathy, suspected Wernicke's encephalopathy -History of confabulating reported by neuroimaging with CT scan showing atrophy and MRI revealing symmetric diffusion weighted and T2/FLAIR hyperintense signal abnormality within the medial thalami. - s/p IV thiamine, continuing oral thiamine, folic acid, multivitamin - Patient will need outpatient follow-up with neurology and consideration of neuropsychiatry evaluation.  - Continue Haldol and Ativan as needed for extreme agitation; hydroxyzine for anxiety  Essential hypertension - Continue norvasc  Alcohol abuse: EtOH level negative on admission, UDS negative.  - No evidence of withdrawal, CIWA DC'ed - Continue thiamine, folate, multivitamin  Tobacco abuse -Nicotine patch, will continue to avoid precipitating withdrawal  Generalized weakness:  - PT/OT recommending  SNF  Normocytic anemia, possibly due to nutritional deficiencies: Resolved.  - Check CBC intermittently.  Chest tightness/shortness of breath and dizziness: Transient, have now resolved. Patient not orthostatic - Monitor clinically  Constipation - Continue bowel regimen  DVT prophylaxis: Lovenox Code Status: Full Family Communication: None Disposition Plan: Patient requires supervision given severity of short term recall deficit and is awaiting placement which is difficult, continuing SNF search per CSW. She is medically stable for discharge.  Consultants:   None  Procedures:   None  Antimicrobials:  Ceftriaxone   Subjective: No acute events per overnight.  Patient is alert and responsive.  No fever or chills.  Objective: Vitals:   03/17/19 1641 03/17/19 1926 03/17/19 2116 03/18/19 0634  BP: (!) 113/91 99/74 107/86 111/82  Pulse: (!) 110 79 74 70  Resp: 16 19 14 19   Temp: 98.2 F (36.8 C) 98.1 F (36.7 C) 97.6 F (36.4 C) 98 F (36.7 C)  TempSrc: Oral Oral  Oral  SpO2: 98% 98% 98% 100%  Weight:      Height:       No intake or output data in the 24 hours ending 03/18/19 1501 Filed Weights   12/15/18 0101 02/22/19 0359  Weight: 98.4 kg 67.1 kg   Gen: Comfortable, no acute distress HEENT: Edentulous, no ulcers CV: RRR Pulm: Nonlabored, clear Neuro: Alert and responsive, pleasantly confused, no obvious acute focal deficit  CBC: Recent Labs  Lab 03/15/19 0225  WBC 7.3  HGB 12.9  HCT 38.5  MCV 86.5  PLT 309   Basic Metabolic Panel: Recent Labs  Lab 03/15/19 0225  NA 138  K 5.0  CL 103  CO2 24  GLUCOSE 92  BUN 14  CREATININE 0.71  CALCIUM 9.6     LOS: 95 days  Time spent: 25 minutes.  Benito Mccreedy, MD [facility] [email or web address] 03/18/2019, 3:01 PM

## 2019-03-19 NOTE — Progress Notes (Signed)
PROGRESS NOTE  Marie Woods  GUY:[id number] DOB: 1955-04-17 DOA: 12/13/2018 PCP: Ladell Pier, MD   Brief Narrative: 64 year old female with history of asthma, hypertension, alcohol abuse was brought to the hospital for confusion and altered mental status in November 2020. Found to have severe sepsis due to UTI which was treated. Confusion continued and MRI was suggestive Wernicke's encephalopathy which has been treated with thiamine supplementation.  Patient has been seen by PT and OT - recommended SNF-awaiting placement    Assessment & Plan: Principal Problem:   AMS (altered mental status) Active Problems:   History of alcohol use   TOBACCO ABUSE   Essential hypertension   Confusion   Acute metabolic encephalopathy   Agitation   Acute lower UTI   Sepsis ([place])   Sepsis secondary to UTI ([facility])   Normocytic anemia  Severe sepsis -Resolved -Felt to be secondary to urinary etiology/UTI-urine culture negative -Chest x-ray negative for acute infiltrate -Treated with supportive care including IV fluids and antibiotics ceftriaxone   Acute metabolic encephalopathy, suspected Wernicke's encephalopathy -History of confabulating reported by neuroimaging with CT scan showing atrophy and MRI revealing symmetric diffusion weighted and T2/FLAIR hyperintense signal abnormality within the medial thalami. - s/p IV thiamine, continuing oral thiamine, folic acid, multivitamin - Patient will need outpatient follow-up with neurology and consideration of neuropsychiatry evaluation.  - Continue Haldol and Ativan as needed for extreme agitation; hydroxyzine for anxiety  Essential hypertension - Continue norvasc  Alcohol abuse: EtOH level negative on admission, UDS negative.  - No evidence of withdrawal, CIWA DC'ed - Continue thiamine, folate, multivitamin  Tobacco abuse -Nicotine patch, will continue to avoid precipitating withdrawal  Generalized weakness:  - PT/OT recommending  SNF  Normocytic anemia, possibly due to nutritional deficiencies: Resolved.  - Check CBC intermittently.  Chest tightness/shortness of breath and dizziness: Transient, have now resolved. Patient not orthostatic - Monitor clinically  Constipation - Continue bowel regimen  DVT prophylaxis: Lovenox Code Status: Full Family Communication: None Disposition Plan: Patient requires supervision given severity of short term recall deficit and is awaiting placement which is difficult, continuing SNF search per CSW. She is medically stable for discharge.  Consultants:   None  Procedures:   None  Antimicrobials:  Ceftriaxone   Subjective: No acute events per overnight.  No new complaints Objective: Vitals:   03/17/19 2116 03/18/19 0634 03/18/19 2158 03/19/19 1000  BP: 107/86 111/82 (!) 89/65 120/74  Pulse: 74 70 73 74  Resp: 14 19 14 12   Temp: 97.6 F (36.4 C) 98 F (36.7 C) 97.6 F (36.4 C) 97.8 F (36.6 C)  TempSrc:  Oral  Oral  SpO2: 98% 100% 93% 94%  Weight:      Height:        Intake/Output Summary (Last 24 hours) at 03/19/2019 1340 Last data filed at 03/18/2019 2045 Gross per 24 hour  Intake 240 ml  Output -  Net 240 ml   Filed Weights   12/15/18 0101 02/22/19 0359  Weight: 98.4 kg 67.1 kg   Gen: Comfortable, no acute distress HEENT: Edentulous, no ulcers CV: RRR Pulm: Nonlabored, clear Neuro: Alert and responsive, pleasantly confused, no obvious acute focal deficit  CBC: Recent Labs  Lab 03/15/19 0225  WBC 7.3  HGB 12.9  HCT 38.5  MCV 86.5  PLT 563   Basic Metabolic Panel: Recent Labs  Lab 03/15/19 0225  NA 138  K 5.0  CL 103  CO2 24  GLUCOSE 92  BUN 14  CREATININE 0.71  CALCIUM 9.6     LOS: 96 days   Time spent: 15 minutes.  Jackie Plum, MD [facility] [email or web address] 03/19/2019, 1:40 PM

## 2019-03-20 NOTE — TOC Progression Note (Signed)
Transition of Care [facility]) - Progression Note    Patient Details  Name: Marie Woods MRN: [id number] Date of Birth: Dec 02, 1955  Transition of Care [facility]) CM/SW Contact  Mearl Latin, LCSW Phone Number: 03/20/2019, 9:01 AM  Clinical Narrative:    CSW continuing to follow for available ALF/SNF beds.    Expected Discharge Plan: Skilled Nursing Facility Barriers to Discharge: No SNF bed, SNF Pending payor source - LOG  Expected Discharge Plan and Services Expected Discharge Plan: Skilled Nursing Facility In-house Referral: Clinical Social Work Discharge Planning Services: NA Post Acute Care Choice: Skilled Nursing Facility Living arrangements for the past 2 months: Apartment                 DME Arranged: N/A DME Agency: NA                   Social Determinants of Health (SDOH) Interventions    Readmission Risk Interventions No flowsheet data found.

## 2019-03-20 NOTE — Progress Notes (Signed)
PROGRESS NOTE    Marie Woods  EXB:[id number]  DOB: 1955/06/30  PCP: Marcine Matar, MD 64 year old female with history of asthma, hypertension, alcohol abuse was brought to the hospital for confusion and altered mental status in November 2020. Found to have severe sepsis due to UTI which was treated. Confusion continued and MRI was suggestive Wernicke's encephalopathy which has been treated with thiamine supplementation.  Patient has been seen by PT and OT - recommended SNF-awaiting placement   Subjective:  No acute issues.  Resting comfortably.  Has several bottles of Ensure in applesauce at bedside.  According to nurse, does not like Ensure.  Objective: Vitals:   03/18/19 2158 03/19/19 1000 03/19/19 2209 03/20/19 0847  BP: (!) 89/65 120/74 121/81 117/78  Pulse: 73 74 79 75  Resp: 14 12 16 18   Temp: 97.6 F (36.4 C) 97.8 F (36.6 C) 98.1 F (36.7 C) 98.5 F (36.9 C)  TempSrc:  Oral    SpO2: 93% 94% 96% 100%  Weight:      Height:        Intake/Output Summary (Last 24 hours) at 03/20/2019 1043 Last data filed at 03/20/2019 1012 Gross per 24 hour  Intake 480 ml  Output --  Net 480 ml   Filed Weights   12/15/18 0101 02/22/19 0359  Weight: 98.4 kg 67.1 kg    Physical Examination:  General exam: Appears calm and comfortable  Respiratory system: Clear to auscultation. Respiratory effort normal. Cardiovascular system: S1 & S2 heard, RRR. No JVD, murmurs, rubs, gallops or clicks. No pedal edema. Gastrointestinal system: Abdomen is nondistended, soft and nontender. Normal bowel sounds heard. Central nervous system: Alert and oriented. No new focal neurological deficits. Extremities: No contractures, edema or joint deformities.  Skin: No rashes, lesions or ulcers Psychiatry: Judgement and insight appear normal. Mood & affect appropriate.   Data Reviewed: I have personally reviewed following labs and imaging studies  CBC: Recent Labs  Lab 03/15/19 0225  WBC 7.3   HGB 12.9  HCT 38.5  MCV 86.5  PLT 309   Basic Metabolic Panel: Recent Labs  Lab 03/15/19 0225  NA 138  K 5.0  CL 103  CO2 24  GLUCOSE 92  BUN 14  CREATININE 0.71  CALCIUM 9.6   GFR: Estimated Creatinine Clearance: 71.8 mL/min (by C-G formula based on SCr of 0.71 mg/dL). Liver Function Tests: No results for input(s): AST, ALT, ALKPHOS, BILITOT, PROT, ALBUMIN in the last 168 hours. No results for input(s): LIPASE, AMYLASE in the last 168 hours. No results for input(s): AMMONIA in the last 168 hours. Coagulation Profile: No results for input(s): INR, PROTIME in the last 168 hours. Cardiac Enzymes: No results for input(s): CKTOTAL, CKMB, CKMBINDEX, TROPONINI in the last 168 hours. BNP (last 3 results) No results for input(s): PROBNP in the last 8760 hours. HbA1C: No results for input(s): HGBA1C in the last 72 hours. CBG: No results for input(s): GLUCAP in the last 168 hours. Lipid Profile: No results for input(s): CHOL, HDL, LDLCALC, TRIG, CHOLHDL, LDLDIRECT in the last 72 hours. Thyroid Function Tests: No results for input(s): TSH, T4TOTAL, FREET4, T3FREE, THYROIDAB in the last 72 hours. Anemia Panel: No results for input(s): VITAMINB12, FOLATE, FERRITIN, TIBC, IRON, RETICCTPCT in the last 72 hours. Sepsis Labs: No results for input(s): PROCALCITON, LATICACIDVEN in the last 168 hours.  No results found for this or any previous visit (from the past 240 hour(s)).    Radiology Studies: No results found.  Scheduled Meds: . amLODipine  5 mg Oral Daily  . enoxaparin (LOVENOX) injection  40 mg Subcutaneous Q24H  . feeding supplement (ENSURE ENLIVE)  237 mL Oral BID BM  . folic acid  1 mg Oral Daily  . ketoconazole   Topical BID  . multivitamin with minerals  1 tablet Oral Daily  . nicotine  14 mg Transdermal Daily  . polyethylene glycol  17 g Oral BID  . senna-docusate  1 tablet Oral BID  . thiamine  100 mg Oral Daily   Continuous Infusions:  Assessment  & Plan:   Severe sepsis -Resolved -Felt to be secondary to urinary etiology/UTI-urine culture negative -Chest x-ray negative for acute infiltrate -Treated with supportive care including IV fluids and antibiotics ceftriaxone   Acute metabolic encephalopathy, suspected Wernicke's encephalopathy -History of confabulating reported by neuroimaging with CT scan showing atrophy and MRI revealing symmetric diffusion weighted and T2/FLAIR hyperintense signal abnormality within the medial thalami. - s/p IV thiamine, continuing oral thiamine, folic acid, multivitamin - Patient will need outpatient follow-up with neurology and consideration of neuropsychiatry evaluation.  - Continue Haldol and Ativan as needed for extreme agitation; hydroxyzine for anxiety  Essential hypertension - Continue norvasc  Alcohol abuse: EtOH level negative on admission, UDS negative.  - No evidence of withdrawal, CIWA DC'ed - Continue thiamine, folate, multivitamin  Tobacco abuse -Nicotine patch, will continue to avoid precipitating withdrawal  Generalized weakness:  - PT/OT recommending SNF  Normocytic anemia, possibly due to nutritional deficiencies: Resolved.  - Check CBC intermittently.  Chest tightness/shortness of breath and dizziness: Transient, have now resolved. Patient not orthostatic - Monitor clinically  Constipation - Continue bowel regimen  DVT prophylaxis: Lovenox Code Status: Full Family Communication: None Disposition Plan: Patient requires supervision given severity of short term recall deficit and is awaiting placement which is difficult, continuing SNF search per CSW. She is medically stable for discharge.      LOS: 97 days    Time spent: 15 minutes    Guilford Shi, MD [facility] Pager in [place]  If 7PM-7AM, please contact night-coverage [email or web address] Password [facility] 03/20/2019, 10:43 AM

## 2019-03-21 NOTE — NC FL2 (Signed)
[place] MEDICAID FL2 LEVEL OF CARE SCREENING TOOL     IDENTIFICATION  Patient Name: Marie Woods Birthdate: 05/08/1955 Sex: female Admission Date (Current Location): 12/13/2018  [facility] and [place] Number:  [place] and Address:  [facility] [place], [place] [address] [place], [place] [address]      Provider Number: [id number]  Attending Physician Name and Address:  Guilford Shi, MD  Relative Name and Phone Number:  Cristal Deer, Niece, [phone]    Current Level of Care: Hospital Recommended Level of Care: [place] Prior Approval Number:    Date Approved/Denied:   PASRR Number: Under Manual Review  Discharge Plan: SNF    Current Diagnoses: Patient Active Problem List   Diagnosis Date Noted  . Sepsis secondary to UTI ([place])   . Normocytic anemia   . Acute metabolic encephalopathy   . Agitation   . Acute lower UTI   . Sepsis ([place])   . Confusion   . AMS (altered mental status) 12/13/2018  . Chronic pain of left knee 04/12/2017  . Chronic pain of right knee 04/12/2017  . ASCUS with positive high risk HPV cervical 05/23/2015  . Healthcare maintenance 03/28/2015  . Alopecia 06/13/2014  . DEPRESSION 04/03/2010  . TOBACCO ABUSE 06/28/2009  . ALLERGIC RHINITIS 06/28/2009  . History of alcohol use 01/10/2009  . Essential hypertension 01/10/2009  . Asthma 01/10/2009  . Dermatitis, atopic 01/10/2009    Orientation RESPIRATION BLADDER Height & Weight     Self, Situation, Place(Fluctuates)  Normal Continent Weight: 148 lb (67.1 kg) Height:  5\' 7"  (170.2 cm)  BEHAVIORAL SYMPTOMS/MOOD NEUROLOGICAL BOWEL NUTRITION STATUS  (None)   Continent Diet(Regular)  AMBULATORY STATUS COMMUNICATION OF NEEDS Skin   Supervision Verbally Normal                       Personal Care Assistance Level of Assistance  Bathing, Feeding, Dressing Bathing Assistance: Limited assistance Feeding assistance:  Independent Dressing Assistance: Limited assistance Total Care Assistance: Limited assistance   Functional Limitations Info  Sight, Hearing, Speech Sight Info: Adequate Hearing Info: Adequate Speech Info: Adequate    SPECIAL CARE FACTORS FREQUENCY  PT (By licensed PT)     PT Frequency: 3x/week OT Frequency: 5x/wk            Contractures Contractures Info: Not present    Additional Factors Info  Code Status, Allergies Code Status Info: Full Code Allergies Info: No Known Allergies           Current Medications (03/21/2019):  This is the current hospital active medication list Current Facility-Administered Medications  Medication Dose Route Frequency Provider Last Rate Last Admin  . acetaminophen (TYLENOL) tablet 650 mg  650 mg Oral Q6H PRN Harold Hedge, MD   650 mg at 03/20/19 2135   Or  . acetaminophen (TYLENOL) suppository 650 mg  650 mg Rectal Q6H PRN Harold Hedge, MD      . albuterol (PROVENTIL) (2.5 MG/3ML) 0.083% nebulizer solution 2.5 mg  2.5 mg Nebulization Q4H PRN Aline August, MD   2.5 mg at 03/05/19 0943  . alum & mag hydroxide-simeth (MAALOX/MYLANTA) 200-200-20 MG/5ML suspension 30 mL  30 mL Oral Q6H PRN Adele Barthel D, MD   30 mL at 03/15/19 1411  . amLODipine (NORVASC) tablet 5 mg  5 mg Oral Daily Harold Hedge, MD   5 mg at 03/21/19 0920  . calcium carbonate (TUMS - dosed in mg elemental calcium)  chewable tablet 200 mg of elemental calcium  1 tablet Oral TID PRN Tyrone Nine, MD   200 mg of elemental calcium at 03/15/19 1619  . camphor-menthol (SARNA) lotion   Topical PRN Gwenyth Bender, NP   Given at 01/28/19 [phone]  . enoxaparin (LOVENOX) injection 40 mg  40 mg Subcutaneous Q24H Earlie Lou L, MD   40 mg at 03/21/19 3614  . feeding supplement (ENSURE ENLIVE) (ENSURE ENLIVE) liquid 237 mL  237 mL Oral BID BM Sara Chu D, MD   237 mL at 03/21/19 0920  . folic acid (FOLVITE) tablet 1 mg  1 mg Oral Daily Edsel Petrin, DO   1 mg at  03/21/19 0919  . guaiFENesin-dextromethorphan (ROBITUSSIN DM) 100-10 MG/5ML syrup 5 mL  5 mL Oral Q4H PRN Hanley Ben, Kshitiz, MD   5 mL at 03/19/19 1933  . haloperidol lactate (HALDOL) injection 5 mg  5 mg Intramuscular Q6H PRN Glade Lloyd, MD   5 mg at 02/18/19 1740  . hydrocerin (EUCERIN) cream 2 application  2 application Topical PRN Ky Barban, MD   2 application at 03/14/19 2132  . hydrOXYzine (ATARAX/VISTARIL) tablet 25 mg  25 mg Oral BID PRN Marlin Canary U, DO   25 mg at 03/12/19 1754  . ketoconazole (NIZORAL) 2 % cream   Topical BID Jae Dire, MD   Given at 03/21/19 0920  . loratadine (CLARITIN) tablet 10 mg  10 mg Oral Daily PRN Glade Lloyd, MD   10 mg at 02/24/19 1659  . LORazepam (ATIVAN) injection 1 mg  1 mg Intramuscular Q6H PRN Glade Lloyd, MD      . meclizine (ANTIVERT) tablet 12.5 mg  12.5 mg Oral TID PRN Sara Chu D, MD   12.5 mg at 01/09/19 1218  . multivitamin with minerals tablet 1 tablet  1 tablet Oral Daily Edsel Petrin, DO   1 tablet at 03/21/19 [phone]  . nicotine (NICODERM CQ - dosed in mg/24 hours) patch 14 mg  14 mg Transdermal Daily Pokhrel, Laxman, MD   14 mg at 03/21/19 0918  . ondansetron (ZOFRAN) tablet 4 mg  4 mg Oral Q6H PRN Rometta Emery, MD   4 mg at 02/28/19 1628   Or  . ondansetron (ZOFRAN) injection 4 mg  4 mg Intravenous Q6H PRN Earlie Lou L, MD      . phenol (CHLORASEPTIC) mouth spray 1 spray  1 spray Mouth/Throat PRN Edsel Petrin, DO   1 spray at 03/20/19 2135  . polyethylene glycol (MIRALAX / GLYCOLAX) packet 17 g  17 g Oral BID Marguerita Merles [place], DO   17 g at 03/21/19 0920  . senna-docusate (Senokot-S) tablet 1 tablet  1 tablet Oral BID Marguerita Merles [place], [place]   1 tablet at 03/21/19 4008  . thiamine (VITAMIN B-1) tablet 100 mg  100 mg Oral Daily Glade Lloyd, MD   100 mg at 03/21/19 0919  . traMADol (ULTRAM) tablet 50 mg  50 mg Oral Q6H PRN Glade Lloyd, MD   50 mg at 03/11/19 1142  . traZODone (DESYREL)  tablet 50 mg  50 mg Oral QHS PRN Dimple Nanas, MD   50 mg at 03/15/19 2016     Discharge Medications: Please see discharge summary for a list of discharge medications.  Relevant Imaging Results:  Relevant Lab Results:   Additional Information SSN: [id number]; COVID negative on 12/13/2018  Medicaid and Disability application has been applied for in [facility] [place], [place]

## 2019-03-21 NOTE — Progress Notes (Signed)
PROGRESS NOTE    Marie Woods  NWG:[id number]  DOB: 11-05-1955  PCP: Ladell Pier, MD 64 year old female with history of asthma, hypertension, alcohol abuse was brought to the hospital for confusion and altered mental status in November 2020. Found to have severe sepsis due to UTI which was treated. Confusion continued and MRI was suggestive Wernicke's encephalopathy which has been treated with thiamine supplementation.  Patient has been seen by PT and OT - recommended SNF-awaiting placement  Subjective:No acute issues.  Sitting on side of the bed and talking to staff.  Appears to be in good spirits. Objective: Vitals:   03/20/19 0847 03/20/19 1735 03/20/19 2318 03/21/19 0747  BP: 117/78 121/85 107/78 100/70  Pulse: 75 79 78 68  Resp: 18 19 20 17   Temp: 98.5 F (36.9 C) 98.3 F (36.8 C) 98.4 F (36.9 C) 98.3 F (36.8 C)  TempSrc:      SpO2: 100% 97% 98% 99%  Weight:      Height:        Intake/Output Summary (Last 24 hours) at 03/21/2019 1434 Last data filed at 03/20/2019 2120 Gross per 24 hour  Intake 480 ml  Output --  Net 480 ml   Filed Weights   12/15/18 0101 02/22/19 0359  Weight: 98.4 kg 67.1 kg    Physical Examination:  General exam: Appears calm and comfortable  Respiratory system: Clear to auscultation. Respiratory effort normal. Cardiovascular system: S1 & S2 heard, RRR. No JVD, murmurs, rubs, gallops or clicks. No pedal edema. Gastrointestinal system: Abdomen is nondistended, soft and nontender. Normal bowel sounds heard. Central nervous system: Awake and alert. No new focal neurological deficits. Extremities: No contractures, edema or joint deformities.    Data Reviewed: I have personally reviewed following labs and imaging studies  CBC: Recent Labs  Lab 03/15/19 0225  WBC 7.3  HGB 12.9  HCT 38.5  MCV 86.5  PLT 578   Basic Metabolic Panel: Recent Labs  Lab 03/15/19 0225  NA 138  K 5.0  CL 103  CO2 24  GLUCOSE 92  BUN 14   CREATININE 0.71  CALCIUM 9.6   GFR: Estimated Creatinine Clearance: 71.8 mL/min (by C-G formula based on SCr of 0.71 mg/dL). Liver Function Tests: No results for input(s): AST, ALT, ALKPHOS, BILITOT, PROT, ALBUMIN in the last 168 hours. No results for input(s): LIPASE, AMYLASE in the last 168 hours. No results for input(s): AMMONIA in the last 168 hours. Coagulation Profile: No results for input(s): INR, PROTIME in the last 168 hours. Cardiac Enzymes: No results for input(s): CKTOTAL, CKMB, CKMBINDEX, TROPONINI in the last 168 hours. BNP (last 3 results) No results for input(s): PROBNP in the last 8760 hours. HbA1C: No results for input(s): HGBA1C in the last 72 hours. CBG: No results for input(s): GLUCAP in the last 168 hours. Lipid Profile: No results for input(s): CHOL, HDL, LDLCALC, TRIG, CHOLHDL, LDLDIRECT in the last 72 hours. Thyroid Function Tests: No results for input(s): TSH, T4TOTAL, FREET4, T3FREE, THYROIDAB in the last 72 hours. Anemia Panel: No results for input(s): VITAMINB12, FOLATE, FERRITIN, TIBC, IRON, RETICCTPCT in the last 72 hours. Sepsis Labs: No results for input(s): PROCALCITON, LATICACIDVEN in the last 168 hours.  No results found for this or any previous visit (from the past 240 hour(s)).    Radiology Studies: No results found.      Scheduled Meds: . amLODipine  5 mg Oral Daily  . enoxaparin (LOVENOX) injection  40 mg Subcutaneous Q24H  . feeding supplement (ENSURE  ENLIVE)  237 mL Oral BID BM  . folic acid  1 mg Oral Daily  . ketoconazole   Topical BID  . multivitamin with minerals  1 tablet Oral Daily  . nicotine  14 mg Transdermal Daily  . polyethylene glycol  17 g Oral BID  . senna-docusate  1 tablet Oral BID  . thiamine  100 mg Oral Daily   Continuous Infusions:  Assessment & Plan:    Acute metabolic encephalopathy, suspected Wernicke's encephalopathy-History of confabulating reported by neuroimaging with CT scan showing atrophy  and MRI revealing symmetric diffusion weighted and T2/FLAIR hyperintense signal abnormality within the medial thalami.s/p IV thiamine, continuing oral thiamine, folic acid, multivitamin Patient will need outpatient follow-up with neurology and consideration of neuropsychiatry evaluation.  Continue Haldol and Ativan as needed for extreme agitation; hydroxyzine for anxiety  Severe sepsis-Felt to be secondary to urinary etiology/UTI-urine culture negative, Chest x-ray negative for acute infiltrate-Treated with supportive care including IV fluids and antibiotics ceftriaxone during initial hospital course  Essential hypertension- Continue norvasc  Alcohol abuse: EtOH level negative on admission, UDS negative. - No evidence of withdrawal, CIWA DC'ed- Continue thiamine, folate, multivitamin  Tobacco abuse-Nicotine patch, will continue to avoid precipitating withdrawal  Normocytic anemia, possibly due to nutritional deficiencies: Resolved with improved oral intake. Last hgb check on 2/10 with improved and stable Hgb at 12.9  Constipation- Continue bowel regimen  Generalized weakness: - improving, PT/OT recommending SNF  DVT prophylaxis: Lovenox Code Status: Full Family Communication: None Disposition Plan: Patient requires supervision given severity of short term recall deficit and is awaiting placement which is difficult, continuing SNF search per CSW. She is medically stable for discharge.      LOS: 98 days    Time spent: 15 minutes    Alessandra Bevels, MD [facility] Pager in [place]  If 7PM-7AM, please contact night-coverage [email or web address] Password [facility] 03/21/2019, 2:34 PM

## 2019-03-21 NOTE — TOC Progression Note (Addendum)
Transition of Care [facility]) - Progression Note    Patient Details  Name: Marie Woods MRN: [id number] Date of Birth: 07-26-1955  Transition of Care [facility] CM/SW Contact  Mearl Latin, LCSW Phone Number: 03/21/2019, 9:43 AM  Clinical Narrative:    [facility] unable to accept Medicaid Pending patients at this time. [facility] only accepting private pay patients. [facility] declined due to being concerned patient's Medicaid may not be approved. CSW reached out to [facility] and [facility].    Expected Discharge Plan: Skilled Nursing Facility Barriers to Discharge: No SNF bed, SNF Pending payor source - LOG  Expected Discharge Plan and Services Expected Discharge Plan: Skilled Nursing Facility In-house Referral: Clinical Social Work Discharge Planning Services: NA Post Acute Care Choice: Skilled Nursing Facility Living arrangements for the past 2 months: Apartment                 DME Arranged: N/A DME Agency: NA                   Social Determinants of Health (SDOH) Interventions    Readmission Risk Interventions No flowsheet data found.

## 2019-03-22 NOTE — Progress Notes (Signed)
Occupational Therapy Treatment Patient Details Name: Marie Woods MRN: [id number] DOB: 1955/06/13 Today's Date: 03/22/2019    History of present illness Patient is a 64 year old female with medical history significant of asthma, hypertension, alcohol abuse who was brought in by EMS after neighbors called in stating that patient is confused and altered. Patient admitted Severe Sepsis with Acute metabolic encephalopathy secondary to UTI.    OT comments  Focus of session on continued use of external aids for orientation, use of a daily checklist, and memory strategies. Pt completed 100 ft of functional mobility at (S) level with min-mod cueing required for RW management. OT to consider signing off next session, SNF remains appropriate d/c.    Follow Up Recommendations  SNF;Supervision/Assistance - 24 hour    Equipment Recommendations  3 in 1 bedside commode       Precautions / Restrictions Precautions Precautions: Fall Restrictions Weight Bearing Restrictions: No       Mobility Bed Mobility Overal bed mobility: Modified Independent                Transfers Overall transfer level: Needs assistance Equipment used: Rolling walker (2 wheeled) Transfers: Sit to/from Stand Sit to Stand: Supervision Stand pivot transfers: Supervision       General transfer comment: Supervision for safety    Balance Overall balance assessment: Needs assistance Sitting-balance support: No upper extremity supported;Feet supported Sitting balance-Leahy Scale: Normal Sitting balance - Comments: static sitting balance EOB without LOB   Standing balance support: No upper extremity supported;Single extremity supported;During functional activity Standing balance-Leahy Scale: Fair Standing balance comment: Supervision overall with and without RW use                           ADL either performed or assessed with clinical judgement   ADL Overall ADL's : Needs  assistance/impaired Eating/Feeding: Independent;Sitting   Grooming: Modified independent;Standing                   Toilet Transfer: Firefighter Details (indicate cue type and reason): Supervision for safety Toileting- Clothing Manipulation and Hygiene: Supervision/safety;Sit to/from stand Toileting - Clothing Manipulation Details (indicate cue type and reason): No assist need for peri hygiene seated and clothing management     Functional mobility during ADLs: Supervision/safety;Rolling walker General ADL Comments: Cueing required for upright posture, (S) overall     Vision   Vision Assessment?: No apparent visual deficits          Cognition Arousal/Alertness: Awake/alert Behavior During Therapy: WFL for tasks assessed/performed Overall Cognitive Status: History of cognitive impairments - at baseline Area of Impairment: Attention;Memory;Following commands;Safety/judgement;Awareness;Problem solving                 Orientation Level: Disoriented to;Time Current Attention Level: Selective Memory: Decreased short-term memory Following Commands: Follows one step commands consistently;Follows one step commands with increased time;Follows multi-step commands inconsistently Safety/Judgement: Decreased awareness of safety;Decreased awareness of deficits Awareness: Emergent Problem Solving: Requires verbal cues;Difficulty sequencing General Comments: Reviewed external aids for increasing orientation, memory strategies, and daily checklists. Very poor carryover/interest from pt              General Comments Cueing required for RW management throughout session    Pertinent Vitals/ Pain       Pain Assessment: No/denies pain         Frequency  Min 1X/week        Progress Toward Goals  OT Goals(current  goals can now be found in the care plan section)  Progress towards OT goals: Progressing toward goals  Acute Rehab OT  Goals Patient Stated Goal: return to PLOF OT Goal Formulation: With patient Time For Goal Achievement: 04/04/19 Potential to Achieve Goals: Good  Plan Discharge plan remains appropriate;Frequency remains appropriate       AM-PAC OT "6 Clicks" Daily Activity     Outcome Measure   Help from another person eating meals?: None Help from another person taking care of personal grooming?: None Help from another person toileting, which includes using toliet, bedpan, or urinal?: A Little Help from another person bathing (including washing, rinsing, drying)?: A Little Help from another person to put on and taking off regular upper body clothing?: None Help from another person to put on and taking off regular lower body clothing?: A Little 6 Click Score: 21    End of Session Equipment Utilized During Treatment: Rolling walker  OT Visit Diagnosis: Unsteadiness on feet (R26.81);Other abnormalities of gait and mobility (R26.89);Muscle weakness (generalized) (M62.81);Other symptoms and signs involving cognitive function   Activity Tolerance Patient tolerated treatment well   Patient Left with call bell/phone within reach;Other (comment)   Nurse Communication Mobility status        Time: 9150-5697 OT Time Calculation (min): 25 min  Charges: OT General Charges $OT Visit: 1 Visit OT Treatments $Therapeutic Activity: 23-37 mins  Curtis Sites OTR/L  03/22/2019, 11:10 AM

## 2019-03-22 NOTE — Progress Notes (Signed)
[name]  PROGRESS NOTE    Marie Woods  JIR:[id number] DOB: 1955/12/12 DOA: 12/13/2018 PCP: Ladell Pier, MD   Brief Narrative:   64 year old female with history of asthma, hypertension, alcohol abuse was brought to the hospital for confusion and altered mental status in November 2020. Found to have severe sepsis due to UTI which was treated. Confusion continued and MRI was suggestive Wernicke's encephalopathy which has been treated with thiamine supplementation.  Patient has been seen by PT and OT - recommended SNF-awaiting placement   Assessment & Plan:   Principal Problem:   AMS (altered mental status) Active Problems:   History of alcohol use   TOBACCO ABUSE   Essential hypertension   Confusion   Acute metabolic encephalopathy   Agitation   Acute lower UTI   Sepsis ([place])   Sepsis secondary to UTI ([place])   Normocytic anemia  Acute metabolic encephalopathy suspected Wernicke's encephalopathy     - History of confabulating      - CT scan showing atrophy and MRI revealing symmetric diffusion weighted and T2/FLAIR hyperintense signal abnormality within the medial thalami.     - s/p IV thiamine, continuing oral thiamine, folic acid, multivitamin     - Patient will need outpatient follow-up with neurology and consideration of neuropsychiatry evaluation.       - Continue Haldol and Ativan as needed for extreme agitation; hydroxyzine for anxiety     - working on placement, SNF rec'd d/t STM issues/dysfxn  Severe sepsis     - Felt to be secondary to urinary etiology/UTI     - urine culture negative, Chest x-ray negative for acute infiltrate     - Treated with supportive care including IV fluids and antibiotics ceftriaxone during initial hospital course  Essential hypertension     - continue norvasc  Alcohol abuse     - EtOH level negative on admission, UDS negative.      - no evidence of withdrawal, CIWA DC'd     - continue thiamine, folate, multivitamin  Tobacco  abuse     - Nicotine patch, will continue to avoid precipitating withdrawal  Normocytic anemia     - possibly due to nutritional deficiencies     - Resolved with improved oral intake.      - Last hgb check on 2/10 with improved and stable Hgb at 12.9  Constipation     - continue senakot  Generalized weakness:      - PT/OT recommending SNF  DVT prophylaxis: lovenox Code Status: FULL Family Communication: None at bedside   Disposition Plan: Awaiting placement. Needs placement d/t not safe discharge to self d/t STM issues  Subjective: No acute events ON  Objective: Vitals:   03/21/19 2210 03/21/19 2211 03/21/19 2257 03/22/19 0750  BP: 99/79 115/83 105/75 129/72  Pulse: 73 74 73 61  Resp:   20 18  Temp:   98.4 F (36.9 C) 98.4 F (36.9 C)  TempSrc:    Oral  SpO2: 98%  98% 100%  Weight:      Height:        Intake/Output Summary (Last 24 hours) at 03/22/2019 1558 Last data filed at 03/21/2019 1930 Gross per 24 hour  Intake 120 ml  Output 0 ml  Net 120 ml   Filed Weights   12/15/18 0101 02/22/19 0359  Weight: 98.4 kg 67.1 kg    Examination:  General: 64 y.o. female resting in bed in NAD Cardiovascular: RRR, +S1, S2, no m/g/r Respiratory: CTABL,  no w/r/r, normal WOB GI: BS+, NDNT, no masses noted MSK: No e/c/c Neuro: somnolent   Data Reviewed: I have personally reviewed following labs and imaging studies.  CBC: No results for input(s): WBC, NEUTROABS, HGB, HCT, MCV, PLT in the last 168 hours. Basic Metabolic Panel: No results for input(s): NA, K, CL, CO2, GLUCOSE, BUN, CREATININE, CALCIUM, MG, PHOS in the last 168 hours. GFR: Estimated Creatinine Clearance: 71.8 mL/min (by C-G formula based on SCr of 0.71 mg/dL). Liver Function Tests: No results for input(s): AST, ALT, ALKPHOS, BILITOT, PROT, ALBUMIN in the last 168 hours. No results for input(s): LIPASE, AMYLASE in the last 168 hours. No results for input(s): AMMONIA in the last 168 hours. Coagulation  Profile: No results for input(s): INR, PROTIME in the last 168 hours. Cardiac Enzymes: No results for input(s): CKTOTAL, CKMB, CKMBINDEX, TROPONINI in the last 168 hours. BNP (last 3 results) No results for input(s): PROBNP in the last 8760 hours. HbA1C: No results for input(s): HGBA1C in the last 72 hours. CBG: No results for input(s): GLUCAP in the last 168 hours. Lipid Profile: No results for input(s): CHOL, HDL, LDLCALC, TRIG, CHOLHDL, LDLDIRECT in the last 72 hours. Thyroid Function Tests: No results for input(s): TSH, T4TOTAL, FREET4, T3FREE, THYROIDAB in the last 72 hours. Anemia Panel: No results for input(s): VITAMINB12, FOLATE, FERRITIN, TIBC, IRON, RETICCTPCT in the last 72 hours. Sepsis Labs: No results for input(s): PROCALCITON, LATICACIDVEN in the last 168 hours.  No results found for this or any previous visit (from the past 240 hour(s)).    Radiology Studies: No results found.   Scheduled Meds: . amLODipine  5 mg Oral Daily  . enoxaparin (LOVENOX) injection  40 mg Subcutaneous Q24H  . feeding supplement (ENSURE ENLIVE)  237 mL Oral BID BM  . folic acid  1 mg Oral Daily  . ketoconazole   Topical BID  . multivitamin with minerals  1 tablet Oral Daily  . nicotine  14 mg Transdermal Daily  . polyethylene glycol  17 g Oral BID  . senna-docusate  1 tablet Oral BID  . thiamine  100 mg Oral Daily   Continuous Infusions:   LOS: 99 days    Time spent: 15 minutes spent in the coordination of care today.    Teddy Spike, DO [facility]  If 7PM-7AM, please contact night-coverage [email or web address] 03/22/2019, 3:58 PM

## 2019-03-22 NOTE — Plan of Care (Signed)

## 2019-03-23 MED ORDER — MENTHOL 3 MG MT LOZG
1.0000 | LOZENGE | OROMUCOSAL | Status: DC | PRN
  Administered 2019-03-23 – 2019-05-08 (×22): 3 mg via ORAL
  Filled 2019-03-23 (×14): qty 9

## 2019-03-23 NOTE — Progress Notes (Signed)
[name]  PROGRESS NOTE    Marie Woods  EVO:[id number] DOB: [birth year] DOA: 12/13/2018 PCP: Ladell Pier, MD   Brief Narrative:   64 year old female with history of asthma, hypertension, alcohol abuse was brought to the hospital for confusion and altered mental status in November 2020. Found to have severe sepsis due to UTI which was treated. Confusion continued and MRI was suggestive Wernicke's encephalopathy which has been treated with thiamine supplementation.  Patient has been seen by PT and OT - recommended SNF-awaiting placement  03/23/19: Per nursing, no acute events ON. C/o some sore throat. Working on placement.    Assessment & Plan:   Principal Problem:   AMS (altered mental status) Active Problems:   History of alcohol use   TOBACCO ABUSE   Essential hypertension   Confusion   Acute metabolic encephalopathy   Agitation   Acute lower UTI   Sepsis (HCC)   Sepsis secondary to UTI (HCC)   Normocytic anemia  Acute metabolic encephalopathy suspected Wernicke's encephalopathy     - History of confabulating      - CT scan showing atrophy and MRI revealing symmetric diffusion weighted and T2/FLAIR hyperintense signal abnormality within the medial thalami.     - s/p IV thiamine, continuing oral thiamine, folic acid, multivitamin     - Patient will need outpatient follow-up with neurology and consideration of neuropsychiatry evaluation.       - Continue Haldol and Ativan as needed for extreme agitation; hydroxyzine for anxiety     - working on placement, SNF rec'd d/t STM issues/dysfxn  Severe sepsis     - Felt to be secondary to urinary etiology/UTI     - urine culture negative, Chest x-ray negative for acute infiltrate     - Treated with supportive care including IV fluids and antibiotics ceftriaxone during initial hospital course  Essential hypertension     - continue norvasc  Alcohol abuse     - EtOH level negative on admission, UDS negative.      - no  evidence of withdrawal, CIWA DC'd     - continue thiamine, folate, multivitamin  Tobacco abuse     - Nicotine patch, will continue to avoid precipitating withdrawal  Normocytic anemia     - possibly due to nutritional deficiencies     - Resolved with improved oral intake.      - Last hgb check on 2/10 with improved and stable Hgb at 12.9  Constipation     - continue senakot  Generalized weakness:      - PT/OT recommending SNF  She is unchanged from yesterday. She is medically stable for discharge. Add Cepacol for sore throat. Otherwise, continue as above.    DVT prophylaxis: lovenox Code Status: FULL Family Communication: None at bedside   Disposition Plan: Needs placement d/t not a safe discharge to self d/t STM issues. Awaiting placement.   Subjective: Remains stable. No acute events ON.   Objective: Vitals:   03/21/19 2211 03/21/19 2257 03/22/19 0750 03/23/19 0830  BP: 115/83 105/75 129/72 98/67  Pulse: 74 73 61 68  Resp:  20 18 17   Temp:  98.4 F (36.9 C) 98.4 F (36.9 C) 98.5 F (36.9 C)  TempSrc:   Oral   SpO2:  98% 100% 93%  Weight:      Height:       No intake or output data in the 24 hours ending 03/23/19 1436 Filed Weights   12/15/18 0101 02/22/19 0359  Weight:  98.4 kg 67.1 kg    Examination:  General: 63 y.o. female resting in bed in NAD Cardiovascular: RRR, +S1, S2, no m/g/r, equal pulses throughout Respiratory: CTABL, no w/r/r, normal WOB GI: BS+, NDNT, soft MSK: No e/c/c Neuro: sleeping   Data Reviewed: I have personally reviewed following labs and imaging studies.  CBC: No results for input(s): WBC, NEUTROABS, HGB, HCT, MCV, PLT in the last 168 hours. Basic Metabolic Panel: No results for input(s): NA, K, CL, CO2, GLUCOSE, BUN, CREATININE, CALCIUM, MG, PHOS in the last 168 hours. GFR: Estimated Creatinine Clearance: 71.8 mL/min (by C-G formula based on SCr of 0.71 mg/dL). Liver Function Tests: No results for input(s): AST, ALT,  ALKPHOS, BILITOT, PROT, ALBUMIN in the last 168 hours. No results for input(s): LIPASE, AMYLASE in the last 168 hours. No results for input(s): AMMONIA in the last 168 hours. Coagulation Profile: No results for input(s): INR, PROTIME in the last 168 hours. Cardiac Enzymes: No results for input(s): CKTOTAL, CKMB, CKMBINDEX, TROPONINI in the last 168 hours. BNP (last 3 results) No results for input(s): PROBNP in the last 8760 hours. HbA1C: No results for input(s): HGBA1C in the last 72 hours. CBG: No results for input(s): GLUCAP in the last 168 hours. Lipid Profile: No results for input(s): CHOL, HDL, LDLCALC, TRIG, CHOLHDL, LDLDIRECT in the last 72 hours. Thyroid Function Tests: No results for input(s): TSH, T4TOTAL, FREET4, T3FREE, THYROIDAB in the last 72 hours. Anemia Panel: No results for input(s): VITAMINB12, FOLATE, FERRITIN, TIBC, IRON, RETICCTPCT in the last 72 hours. Sepsis Labs: No results for input(s): PROCALCITON, LATICACIDVEN in the last 168 hours.  No results found for this or any previous visit (from the past 240 hour(s)).    Radiology Studies: No results found.   Scheduled Meds: . amLODipine  5 mg Oral Daily  . enoxaparin (LOVENOX) injection  40 mg Subcutaneous Q24H  . feeding supplement (ENSURE ENLIVE)  237 mL Oral BID BM  . folic acid  1 mg Oral Daily  . ketoconazole   Topical BID  . multivitamin with minerals  1 tablet Oral Daily  . nicotine  14 mg Transdermal Daily  . polyethylene glycol  17 g Oral BID  . senna-docusate  1 tablet Oral BID  . thiamine  100 mg Oral Daily   Continuous Infusions:   LOS: 100 days    Time spent: 15 minutes spent in the coordination of care today.    Teddy Spike, DO [facility]  If 7PM-7AM, please contact night-coverage [email or web address] 03/23/2019, 2:36 PM

## 2019-03-23 NOTE — Plan of Care (Signed)
Pt alert and oriented x3 able to express needs. Denies pain. Pt on room air no acute distress noted. Continent of bowel and bladder low risk for moisture related skin issues, able to move self in bed, independent in room, appetite poor supplements in place encourage to eat meals to promote strength and nutritional balance. Able to demonstrate proper use of call bell which is in reach, bed low skid resistant socks presently on patient . Awaiting placement to a SNF for discharge. will continue to monitor

## 2019-03-24 NOTE — Progress Notes (Signed)
[name]  PROGRESS NOTE    Marie Woods  IRW:[id number] DOB: 1955/11/10 DOA: 12/13/2018 PCP: Marcine Matar, MD   Brief Narrative:   64 year old female with history of asthma, hypertension, alcohol abuse was brought to the hospital for confusion and altered mental status in November 2020. Found to have severe sepsis due to UTI which was treated. Confusion continued and MRI was suggestive Wernicke's encephalopathy which has been treated with thiamine supplementation.  Patient has been seen by PT and OT - recommended SNF-awaiting placement  03/24/19: She reports sore throat is better. Denies any other complaints today.     Assessment & Plan:   Principal Problem:   AMS (altered mental status) Active Problems:   History of alcohol use   TOBACCO ABUSE   Essential hypertension   Confusion   Acute metabolic encephalopathy   Agitation   Acute lower UTI   Sepsis (HCC)   Sepsis secondary to UTI (HCC)   Normocytic anemia  Acute metabolic encephalopathy suspected Wernicke's encephalopathy - History of confabulating  - CT scan showing atrophy and MRI revealing symmetric diffusion weighted and T2/FLAIR hyperintense signal abnormality within the medial thalami. - s/p IV thiamine, continuing oral thiamine, folic acid, multivitamin - Patient will need outpatient follow-up with neurology and consideration of neuropsychiatry evaluation.  - Continue Haldol and Ativan as needed for extreme agitation; hydroxyzine for anxiety - working on placement, SNF rec'd d/t STM issues/dysfxn  Severe sepsis - Felt to be secondary to urinary etiology/UTI - urine culture negative, Chest x-ray negative for acute infiltrate - Treated with supportive care including IV fluids and antibiotics ceftriaxone during initial hospital course  Essential hypertension - continue norvasc  Alcohol abuse - EtOH level negative on admission, UDS negative.  - no evidence of  withdrawal, CIWA DC'd - continue thiamine, folate, multivitamin  Tobacco abuse - Nicotine patch, will continue to avoid precipitating withdrawal  Normocytic anemia - possibly due to nutritional deficiencies - Resolved with improved oral intake.  - Last hgb check on 2/10 with improved and stable Hgb at 12.9  Constipation - continue senakot  Generalized weakness:  - PT/OT recommending SNF  She states that sore throat is improved. We are still working on placement. She is medically stable for discharge when bed is available.  DVT prophylaxis: lovenox Code Status: FULL Family Communication: None at bedside.   Disposition Plan: Unable to discharge to self d/t STM issues. She requires placement. Medically stable for discharge when bed available.   Subjective: "It's feeling better."  Objective: Vitals:   03/22/19 0750 03/23/19 0830 03/23/19 1507 03/23/19 2132  BP: 129/72 98/67 121/84 130/78  Pulse: 61 68 87 74  Resp: 18 17 17 18   Temp: 98.4 F (36.9 C) 98.5 F (36.9 C) 98.7 F (37.1 C) 98.5 F (36.9 C)  TempSrc: Oral   Oral  SpO2: 100% 93% 97% 96%  Weight:      Height:       No intake or output data in the 24 hours ending 03/24/19 1506 Filed Weights   12/15/18 0101 02/22/19 0359  Weight: 98.4 kg 67.1 kg    Examination:  General: 64 y.o. female resting in bed in NAD Cardiovascular: RRR, +S1, S2, no m/g/r Respiratory: CTABL, no w/r/r, good air movement GI: BS+, NDNT, soft MSK: No e/c/c Neuro: Alert to name, follows commands   Data Reviewed: I have personally reviewed following labs and imaging studies.  CBC: No results for input(s): WBC, NEUTROABS, HGB, HCT, MCV, PLT in the last 168 hours.  Basic Metabolic Panel: No results for input(s): NA, K, CL, CO2, GLUCOSE, BUN, CREATININE, CALCIUM, MG, PHOS in the last 168 hours. GFR: Estimated Creatinine Clearance: 71.8 mL/min (by C-G formula based on SCr of 0.71 mg/dL). Liver Function  Tests: No results for input(s): AST, ALT, ALKPHOS, BILITOT, PROT, ALBUMIN in the last 168 hours. No results for input(s): LIPASE, AMYLASE in the last 168 hours. No results for input(s): AMMONIA in the last 168 hours. Coagulation Profile: No results for input(s): INR, PROTIME in the last 168 hours. Cardiac Enzymes: No results for input(s): CKTOTAL, CKMB, CKMBINDEX, TROPONINI in the last 168 hours. BNP (last 3 results) No results for input(s): PROBNP in the last 8760 hours. HbA1C: No results for input(s): HGBA1C in the last 72 hours. CBG: No results for input(s): GLUCAP in the last 168 hours. Lipid Profile: No results for input(s): CHOL, HDL, LDLCALC, TRIG, CHOLHDL, LDLDIRECT in the last 72 hours. Thyroid Function Tests: No results for input(s): TSH, T4TOTAL, FREET4, T3FREE, THYROIDAB in the last 72 hours. Anemia Panel: No results for input(s): VITAMINB12, FOLATE, FERRITIN, TIBC, IRON, RETICCTPCT in the last 72 hours. Sepsis Labs: No results for input(s): PROCALCITON, LATICACIDVEN in the last 168 hours.  No results found for this or any previous visit (from the past 240 hour(s)).    Radiology Studies: No results found.   Scheduled Meds: . amLODipine  5 mg Oral Daily  . enoxaparin (LOVENOX) injection  40 mg Subcutaneous Q24H  . feeding supplement (ENSURE ENLIVE)  237 mL Oral BID BM  . folic acid  1 mg Oral Daily  . ketoconazole   Topical BID  . multivitamin with minerals  1 tablet Oral Daily  . nicotine  14 mg Transdermal Daily  . polyethylene glycol  17 g Oral BID  . senna-docusate  1 tablet Oral BID  . thiamine  100 mg Oral Daily   Continuous Infusions:   LOS: 101 days    Time spent: 15 minutes spent in the coordination of care today.    Jonnie Finner, DO [facility]  If 7PM-7AM, please contact night-coverage [email or web address] 03/24/2019, 3:06 PM

## 2019-03-24 NOTE — Plan of Care (Signed)
Pt alert and oriented x 2- 3 pt is forgetful and has to be reoriented a few times during a shift.  able to express needs. Denies pain. Pt on room air no acute distress noted. Continent of bowel and bladder low risk for moisture related skin issues, able to move self in bed, independent in room, appetite poor supplements in place encourage to eat meals to promote strength and nutritional balance. Able to demonstrate proper use of call bell which is in reach, bed low skid resistant socks presently on patient . Awaiting placement to a SNF for discharge. will continue to monitor

## 2019-03-24 NOTE — TOC Progression Note (Signed)
Transition of Care [facility]) - Progression Note    Patient Details  Name: Marie Woods DOBY MRN: [id number] Date of Birth: 1955/05/20  Transition of [facility]) CM/SW Contact  Mearl Latin, LCSW Phone Number: 03/24/2019, 9:14 AM  Clinical Narrative:    CSW still awaiting response from [facility].    Expected Discharge Plan: Skilled Nursing Facility Barriers to Discharge: No SNF bed, SNF Pending payor source - LOG  Expected Discharge Plan and Services Expected Discharge Plan: Skilled Nursing Facility In-house Referral: Clinical Social Work Discharge Planning Services: NA Post Acute Care Choice: Skilled Nursing Facility Living arrangements for the past 2 months: Apartment                 DME Arranged: N/A DME Agency: NA                   Social Determinants of Health (SDOH) Interventions    Readmission Risk Interventions No flowsheet data found.

## 2019-03-25 NOTE — Progress Notes (Signed)
[name]  PROGRESS NOTE    Marie Woods  [id number]:[id number] DOB: 12/01/1955 DOA: 12/13/2018 PCP: Ladell Pier, MD   Brief Narrative:   64 year old female with history of asthma, hypertension, alcohol abuse was brought to the hospital for confusion and altered mental status in November 2020. Found to have severe sepsis due to UTI which was treated. Confusion continued and MRI was suggestive Wernicke's encephalopathy which has been treated with thiamine supplementation.  Patient has been seen by PT and OT - recommended SNF-awaiting placement  03/25/19: No acute events ON. Awaiting placement.   Assessment & Plan:   Principal Problem:   AMS (altered mental status) Active Problems:   History of alcohol use   TOBACCO ABUSE   Essential hypertension   Confusion   Acute metabolic encephalopathy   Agitation   Acute lower UTI   Sepsis (HCC)   Sepsis secondary to UTI (HCC)   Normocytic anemia  Acute metabolic encephalopathy suspected Wernicke's encephalopathy - History of confabulating  - CT scan showing atrophy and MRI revealing symmetric diffusion weighted and T2/FLAIR hyperintense signal abnormality within the medial thalami. - s/p IV thiamine, continuing oral thiamine, folic acid, multivitamin - Patient will need outpatient follow-up with neurology and consideration of neuropsychiatry evaluation.  - Continue Haldol and Ativan as needed for extreme agitation; hydroxyzine for anxiety - working on placement, SNF rec'd d/t STM issues/dysfxn  Severe sepsis - Felt to be secondary to urinary etiology/UTI - urine culture negative, Chest x-ray negative for acute infiltrate - Treated with supportive care including IV fluids and antibiotics ceftriaxone during initial hospital course  Essential hypertension - continue norvasc     - 03/25/19: BP was up a little bit this morning, continue current Tx, but monitor, may need increase to 10 of  norvasc  Alcohol abuse - EtOH level negative on admission, UDS negative.  - no evidence of withdrawal, CIWA DC'd - continue thiamine, folate, multivitamin  Tobacco abuse - Nicotine patch, will continue to avoid precipitating withdrawal  Normocytic anemia - possibly due to nutritional deficiencies - Resolved with improved oral intake.  - Last hgb check on 2/10 with improved and stable Hgb at 12.9  Constipation - continue senakot  Generalized weakness:  - PT/OT recommending SNF  Looking for placement options. She is medically stable for discharge when bed is available.  DVT prophylaxis: lovenox Code Status: FULL Family Communication: None at bedside   Disposition Plan: Unable to discharge to self d/t STM issues. She requires placement. Medically stable for discharge when bed is secured.   Subjective: No acute events ON  Objective: Vitals:   03/23/19 2132 03/24/19 2118 03/25/19 0731 03/25/19 1230  BP: 130/78 (!) 127/107 98/72 (!) 149/119  Pulse: 74 84 69 81  Resp: 18 10 18 18   Temp: 98.5 F (36.9 C) 98.2 F (36.8 C) 97.9 F (36.6 C) 97.6 F (36.4 C)  TempSrc: Oral     SpO2: 96% 100% 97% 93%  Weight:      Height:        Intake/Output Summary (Last 24 hours) at 03/25/2019 1347 Last data filed at 03/25/2019 1100 Gross per 24 hour  Intake 120 ml  Output --  Net 120 ml   Filed Weights   12/15/18 0101 02/22/19 0359  Weight: 98.4 kg 67.1 kg    Examination:  General: 64 y.o. female resting in bed in NAD Cardiovascular: RRR, +S1, S2, no m/g/r, equal pulses throughout Respiratory: CTABL, no w/r/r, normal WOB GI: BS+, NDNT, no masses noted MSK:  No e/c/c Neuro: sleeping  Data Reviewed: I have personally reviewed following labs and imaging studies.  CBC: No results for input(s): WBC, NEUTROABS, HGB, HCT, MCV, PLT in the last 168 hours. Basic Metabolic Panel: No results for input(s): NA, K, CL, CO2, GLUCOSE, BUN,  CREATININE, CALCIUM, MG, PHOS in the last 168 hours. GFR: Estimated Creatinine Clearance: 71.8 mL/min (by C-G formula based on SCr of 0.71 mg/dL). Liver Function Tests: No results for input(s): AST, ALT, ALKPHOS, BILITOT, PROT, ALBUMIN in the last 168 hours. No results for input(s): LIPASE, AMYLASE in the last 168 hours. No results for input(s): AMMONIA in the last 168 hours. Coagulation Profile: No results for input(s): INR, PROTIME in the last 168 hours. Cardiac Enzymes: No results for input(s): CKTOTAL, CKMB, CKMBINDEX, TROPONINI in the last 168 hours. BNP (last 3 results) No results for input(s): PROBNP in the last 8760 hours. HbA1C: No results for input(s): HGBA1C in the last 72 hours. CBG: No results for input(s): GLUCAP in the last 168 hours. Lipid Profile: No results for input(s): CHOL, HDL, LDLCALC, TRIG, CHOLHDL, LDLDIRECT in the last 72 hours. Thyroid Function Tests: No results for input(s): TSH, T4TOTAL, FREET4, T3FREE, THYROIDAB in the last 72 hours. Anemia Panel: No results for input(s): VITAMINB12, FOLATE, FERRITIN, TIBC, IRON, RETICCTPCT in the last 72 hours. Sepsis Labs: No results for input(s): PROCALCITON, LATICACIDVEN in the last 168 hours.  No results found for this or any previous visit (from the past 240 hour(s)).    Radiology Studies: No results found.   Scheduled Meds: . amLODipine  5 mg Oral Daily  . enoxaparin (LOVENOX) injection  40 mg Subcutaneous Q24H  . feeding supplement (ENSURE ENLIVE)  237 mL Oral BID BM  . folic acid  1 mg Oral Daily  . ketoconazole   Topical BID  . multivitamin with minerals  1 tablet Oral Daily  . nicotine  14 mg Transdermal Daily  . polyethylene glycol  17 g Oral BID  . senna-docusate  1 tablet Oral BID  . thiamine  100 mg Oral Daily   Continuous Infusions:   LOS: 102 days    Time spent: 15 minutes spent in the coordination of care today.    Teddy Spike, DO [facility]  If 7PM-7AM, please contact  night-coverage [email or web address] 03/25/2019, 1:47 PM

## 2019-03-25 NOTE — Progress Notes (Addendum)
Refused 0600 Lovenox injection. Also refused Miralax dose at 2200 med pass last night. Mildly confused and disoriented to time. Unable to recall date of last BM. Chart reviewed. Last documented BM was on 03/15/2019. Denies discomfort.

## 2019-03-26 NOTE — Progress Notes (Signed)
[name]  PROGRESS NOTE    Marie Woods  ZOX:[id number] DOB: [birth year] DOA: 12/13/2018 PCP: Ladell Pier, MD   Brief Narrative:   64 year old female with history of asthma, hypertension, alcohol abuse was brought to the hospital for confusion and altered mental status in November 2020. Found to have severe sepsis due to UTI which was treated. Confusion continued and MRI was suggestive Wernicke's encephalopathy which has been treated with thiamine supplementation.  Patient has been seen by PT and OT - recommended SNF-awaiting placement  03/26/19:She denies complaints this morning. Says her throat is much better.   Assessment & Plan:   Principal Problem:   AMS (altered mental status) Active Problems:   History of alcohol use   TOBACCO ABUSE   Essential hypertension   Confusion   Acute metabolic encephalopathy   Agitation   Acute lower UTI   Sepsis (HCC)   Sepsis secondary to UTI (HCC)   Normocytic anemia  Acute metabolic encephalopathy suspected Wernicke's encephalopathy - History of confabulating  - CT scan showing atrophy and MRI revealing symmetric diffusion weighted and T2/FLAIR hyperintense signal abnormality within the medial thalami. - s/p IV thiamine, continuing oral thiamine, folic acid, multivitamin - Patient will need outpatient follow-up with neurology and consideration of neuropsychiatry evaluation.  - Continue Haldol and Ativan as needed for extreme agitation; hydroxyzine for anxiety - working on placement, SNF rec'd d/t STM issues/dysfxn  Severe sepsis - Felt to be secondary to urinary etiology/UTI - urine culture negative, Chest x-ray negative for acute infiltrate - Treated with supportive care including IV fluids and antibiotics ceftriaxone during initial hospital course  Essential hypertension - continue norvasc     - 03/25/19: BP was up a little bit this morning, continue current Tx, but monitor, may need  increase to 10 of norvasc  Alcohol abuse - EtOH level negative on admission, UDS negative.  - no evidence of withdrawal, CIWA DC'd - continue thiamine, folate, multivitamin  Tobacco abuse - Nicotine patch, will continue to avoid precipitating withdrawal  Normocytic anemia - possibly due to nutritional deficiencies - Resolved with improved oral intake.  - Last hgb check on 2/10 with improved and stable Hgb at 12.9  Constipation - continue senakot  Generalized weakness:  - PT/OT recommending SNF  Says throat is better. No acute events ON. Working on placement options. She is medically stable for discharge when bed is available.  DVT prophylaxis: lovenox Code Status: FULL Family Communication: None at bedside   Disposition Plan: Unable to discharge to self d/t STM issues. She requires placement. Medically stable for discharge when bed is secured.  Subjective: Reports throat is better.   Objective: Vitals:   03/25/19 1230 03/25/19 1643 03/25/19 2139 03/26/19 0830  BP: (!) 149/119 (!) 118/100 99/67 97/62  Pulse: 81 85 74 78  Resp: 18 18 14 16   Temp: 97.6 F (36.4 C) 98 F (36.7 C) 98.3 F (36.8 C) 98.5 F (36.9 C)  TempSrc:   Oral Oral  SpO2: 93% 95% 99% 98%  Weight:      Height:        Intake/Output Summary (Last 24 hours) at 03/26/2019 8119 Last data filed at 03/25/2019 1100 Gross per 24 hour  Intake 120 ml  Output --  Net 120 ml   Filed Weights   12/15/18 0101 02/22/19 0359  Weight: 98.4 kg 67.1 kg    Examination:  General: 64 y.o. female resting in bed in NAD Cardiovascular: RRR, +S1, S2, no m/g/r Respiratory: CTABL, no w/r/r,  normal WOB GI: BS+, NDNT, soft MSK: No e/c/c Neuro: Alert to name, follows commands Psyc: Appropriate interaction and affect, calm/cooperative   Data Reviewed: I have personally reviewed following labs and imaging studies.  CBC: No results for input(s): WBC, NEUTROABS, HGB, HCT,  MCV, PLT in the last 168 hours. Basic Metabolic Panel: No results for input(s): NA, K, CL, CO2, GLUCOSE, BUN, CREATININE, CALCIUM, MG, PHOS in the last 168 hours. GFR: Estimated Creatinine Clearance: 71.8 mL/min (by C-G formula based on SCr of 0.71 mg/dL). Liver Function Tests: No results for input(s): AST, ALT, ALKPHOS, BILITOT, PROT, ALBUMIN in the last 168 hours. No results for input(s): LIPASE, AMYLASE in the last 168 hours. No results for input(s): AMMONIA in the last 168 hours. Coagulation Profile: No results for input(s): INR, PROTIME in the last 168 hours. Cardiac Enzymes: No results for input(s): CKTOTAL, CKMB, CKMBINDEX, TROPONINI in the last 168 hours. BNP (last 3 results) No results for input(s): PROBNP in the last 8760 hours. HbA1C: No results for input(s): HGBA1C in the last 72 hours. CBG: No results for input(s): GLUCAP in the last 168 hours. Lipid Profile: No results for input(s): CHOL, HDL, LDLCALC, TRIG, CHOLHDL, LDLDIRECT in the last 72 hours. Thyroid Function Tests: No results for input(s): TSH, T4TOTAL, FREET4, T3FREE, THYROIDAB in the last 72 hours. Anemia Panel: No results for input(s): VITAMINB12, FOLATE, FERRITIN, TIBC, IRON, RETICCTPCT in the last 72 hours. Sepsis Labs: No results for input(s): PROCALCITON, LATICACIDVEN in the last 168 hours.  No results found for this or any previous visit (from the past 240 hour(s)).    Radiology Studies: No results found.   Scheduled Meds: . amLODipine  5 mg Oral Daily  . enoxaparin (LOVENOX) injection  40 mg Subcutaneous Q24H  . feeding supplement (ENSURE ENLIVE)  237 mL Oral BID BM  . folic acid  1 mg Oral Daily  . ketoconazole   Topical BID  . multivitamin with minerals  1 tablet Oral Daily  . nicotine  14 mg Transdermal Daily  . polyethylene glycol  17 g Oral BID  . senna-docusate  1 tablet Oral BID  . thiamine  100 mg Oral Daily   Continuous Infusions:   LOS: 103 days    Time spent: 15 minutes  spent in the coordination of care today.    Teddy Spike, DO [facility]  If 7PM-7AM, please contact night-coverage [email or web address] 03/26/2019, 9:53 AM

## 2019-03-27 NOTE — Progress Notes (Signed)
[name]  PROGRESS NOTE    Marie Woods  JYN:[id number] DOB: 06-Jan-1956 DOA: 12/13/2018 PCP: Ladell Pier, MD   Brief Narrative:   64 year old female with history of asthma, hypertension, alcohol abuse was brought to the hospital for confusion and altered mental status in November 2020. Found to have severe sepsis due to UTI which was treated. Confusion continued and MRI was suggestive Wernicke's encephalopathy which has been treated with thiamine supplementation.  Patient has been seen by PT and OT - recommended SNF-awaiting placement  03/26/19:She's sleeping this morning, but easily arousable. She denies complaints today. No acute events ON. Working on placement.    Assessment & Plan:   Principal Problem:   AMS (altered mental status) Active Problems:   History of alcohol use   TOBACCO ABUSE   Essential hypertension   Confusion   Acute metabolic encephalopathy   Agitation   Acute lower UTI   Sepsis (HCC)   Sepsis secondary to UTI (HCC)   Normocytic anemia  Acute metabolic encephalopathy suspected Wernicke's encephalopathy - History of confabulating  - CT scan showing atrophy and MRI revealing symmetric diffusion weighted and T2/FLAIR hyperintense signal abnormality within the medial thalami. - s/p IV thiamine, continuing oral thiamine, folic acid, multivitamin - Patient will need outpatient follow-up with neurology and consideration of neuropsychiatry evaluation.  - Continue Haldol and Ativan as needed for extreme agitation; hydroxyzine for anxiety - working on placement, SNF rec'd d/t STM issues/dysfxn  Severe sepsis - Felt to be secondary to urinary etiology/UTI - urine culture negative, Chest x-ray negative for acute infiltrate - Treated with supportive care including IV fluids and antibiotics ceftriaxone during initial hospital course  Essential hypertension - continue norvasc - 03/25/19: BP was up a little bit this  morning, continue current Tx, but monitor, may need increase to 10 of norvasc  Alcohol abuse - EtOH level negative on admission, UDS negative.  - no evidence of withdrawal, CIWA DC'd - continue thiamine, folate, multivitamin  Tobacco abuse - Nicotine patch, will continue to avoid precipitating withdrawal  Normocytic anemia - possibly due to nutritional deficiencies - Resolved with improved oral intake.  - Last hgb check on 2/10 with improved and stable Hgb at 12.9  Constipation - continue senakot  Generalized weakness:  - PT/OT recommending SNF  She is calm and cooperative this morning. She says she has no current needs. CM working on discharge options. She is medically stable for discharge today.   DVT prophylaxis: lovenox Code Status: FULL Family Communication: None at bedside   Disposition Plan: Unable to discharge to self d/t STM problems. She requires placement. This has been difficult to obtain. She is medically stable for discharge when bed is secured.  Subjective: "I'm ok."  Objective: Vitals:   03/25/19 2139 03/26/19 0830 03/26/19 1632 03/26/19 2128  BP: 99/67 97/62 101/65 96/81  Pulse: 74 78 88 78  Resp: 14 16 16 18   Temp: 98.3 F (36.8 C) 98.5 F (36.9 C) 98.1 F (36.7 C) 98.2 F (36.8 C)  TempSrc: Oral Oral    SpO2: 99% 98% 100% 98%  Weight:      Height:       No intake or output data in the 24 hours ending 03/27/19 0709 Filed Weights   12/15/18 0101 02/22/19 0359  Weight: 98.4 kg 67.1 kg    Examination:  General: 64 y.o. female resting in bed in NAD Cardiovascular: RRR, +S1, S2, no m/g/r Respiratory: CTABL, no w/r/r, normal WOB GI: BS+, NDNT, soft MSK: No  e/c/c Neuro: Alert to name, follows commands Psyc: flat affect, calm/cooperative   Data Reviewed: I have personally reviewed following labs and imaging studies.  CBC: No results for input(s): WBC, NEUTROABS, HGB, HCT, MCV, PLT in the last 168  hours. Basic Metabolic Panel: No results for input(s): NA, K, CL, CO2, GLUCOSE, BUN, CREATININE, CALCIUM, MG, PHOS in the last 168 hours. GFR: Estimated Creatinine Clearance: 71.8 mL/min (by C-G formula based on SCr of 0.71 mg/dL). Liver Function Tests: No results for input(s): AST, ALT, ALKPHOS, BILITOT, PROT, ALBUMIN in the last 168 hours. No results for input(s): LIPASE, AMYLASE in the last 168 hours. No results for input(s): AMMONIA in the last 168 hours. Coagulation Profile: No results for input(s): INR, PROTIME in the last 168 hours. Cardiac Enzymes: No results for input(s): CKTOTAL, CKMB, CKMBINDEX, TROPONINI in the last 168 hours. BNP (last 3 results) No results for input(s): PROBNP in the last 8760 hours. HbA1C: No results for input(s): HGBA1C in the last 72 hours. CBG: No results for input(s): GLUCAP in the last 168 hours. Lipid Profile: No results for input(s): CHOL, HDL, LDLCALC, TRIG, CHOLHDL, LDLDIRECT in the last 72 hours. Thyroid Function Tests: No results for input(s): TSH, T4TOTAL, FREET4, T3FREE, THYROIDAB in the last 72 hours. Anemia Panel: No results for input(s): VITAMINB12, FOLATE, FERRITIN, TIBC, IRON, RETICCTPCT in the last 72 hours. Sepsis Labs: No results for input(s): PROCALCITON, LATICACIDVEN in the last 168 hours.  No results found for this or any previous visit (from the past 240 hour(s)).    Radiology Studies: No results found.   Scheduled Meds: . amLODipine  5 mg Oral Daily  . enoxaparin (LOVENOX) injection  40 mg Subcutaneous Q24H  . feeding supplement (ENSURE ENLIVE)  237 mL Oral BID BM  . folic acid  1 mg Oral Daily  . ketoconazole   Topical BID  . multivitamin with minerals  1 tablet Oral Daily  . nicotine  14 mg Transdermal Daily  . polyethylene glycol  17 g Oral BID  . senna-docusate  1 tablet Oral BID  . thiamine  100 mg Oral Daily   Continuous Infusions:   LOS: 104 days    Time spent: 15 minutes spent in the coordination of  care today.    Teddy Spike, DO [facility]  If 7PM-7AM, please contact night-coverage [email or web address] 03/27/2019, 7:09 AM

## 2019-03-27 NOTE — TOC Progression Note (Signed)
Transition of Care [facility]) - Progression Note    Patient Details  Name: Marie Woods MRN: [id number] Date of Birth: Sep 29, 1955  [facility]) CM/SW Contact  Mearl Latin, LCSW Phone Number: 03/27/2019, 4:01 PM  Clinical Narrative:    CSW left voicemail for [facility]. They reported that they were waiting to hear back from patient's DSS Medicaid worker to make sure she will qualify for Medicaid.    Expected Discharge Plan: Skilled Nursing Facility Barriers to Discharge: No SNF bed, SNF Pending payor source - LOG  Expected Discharge Plan and Services Expected Discharge Plan: Skilled Nursing Facility In-house Referral: Clinical Social Work Discharge Planning Services: NA Post Acute Care Choice: Skilled Nursing Facility Living arrangements for the past 2 months: Apartment                 DME Arranged: N/A DME Agency: NA                   Social Determinants of Health (SDOH) Interventions    Readmission Risk Interventions No flowsheet data found.

## 2019-03-27 NOTE — Progress Notes (Signed)
Occupational Therapy Treatment and Discharge Patient Details Name: Marie Woods MRN: [id number] DOB: April 17, 1955 Today's Date: 03/27/2019    History of present illness Patient is a 65 year old female with medical history significant of asthma, hypertension, alcohol abuse who was brought in by EMS after neighbors called in stating that patient is confused and altered. Patient admitted Severe Sepsis with Acute metabolic encephalopathy secondary to UTI.    OT comments  Pt appears to have reached a plateau in cognition and is routinely completing ADL and ambulating within in her room unsupervised. She is awaiting placement in a SNF. No further acute OT needs.  Follow Up Recommendations  SNF;Supervision/Assistance - 24 hour    Equipment Recommendations  None recommended by OT    Recommendations for Other Services      Precautions / Restrictions Precautions Precautions: None       Mobility Bed Mobility Overal bed mobility: Independent                Transfers Overall transfer level: Modified independent Equipment used: None                  Balance     Sitting balance-Leahy Scale: Normal       Standing balance-Leahy Scale: Fair                             ADL either performed or assessed with clinical judgement   ADL Overall ADL's : Independent                                     Functional mobility during ADLs: Modified independent(within her room/bathroom)       Vision       Perception     Praxis      Cognition Arousal/Alertness: Awake/alert Behavior During Therapy: WFL for tasks assessed/performed Overall Cognitive Status: History of cognitive impairments - at baseline                   Orientation Level: Disoriented to;Situation;Time   Memory: Decreased short-term memory   Safety/Judgement: Decreased awareness of deficits     General Comments: pt unable to benefit from memory strategies without  maximum assistance        Exercises     Shoulder Instructions       General Comments      Pertinent Vitals/ Pain       Pain Assessment: No/denies pain  Home Living                                          Prior Functioning/Environment              Frequency           Progress Toward Goals  OT Goals(current goals can now be found in the care plan section)  Progress towards OT goals: Not progressing toward goals - comment  Acute Rehab OT Goals Patient Stated Goal: return to work  Plan Discharge plan remains appropriate    Co-evaluation                 AM-PAC OT "6 Clicks" Daily Activity     Outcome Measure   Help from another person eating meals?: None Help from another person  taking care of personal grooming?: None Help from another person toileting, which includes using toliet, bedpan, or urinal?: None Help from another person bathing (including washing, rinsing, drying)?: None Help from another person to put on and taking off regular upper body clothing?: None Help from another person to put on and taking off regular lower body clothing?: None 6 Click Score: 24    End of Session    OT Visit Diagnosis: Other symptoms and signs involving cognitive function   Activity Tolerance Patient tolerated treatment well   Patient Left in bed;with call bell/phone within reach   Nurse Communication          Time: 1510-1526 OT Time Calculation (min): 16 min  Charges: OT General Charges $OT Visit: 1 Visit OT Treatments $Self Care/Home Management : 8-22 mins  Nestor Lewandowsky, OTR/L Acute Rehabilitation Services Pager: [phone] Office: [phone]   Malka So 03/27/2019, 3:26 PM

## 2019-03-28 LAB — CBC WITH DIFFERENTIAL/PLATELET
Abs Immature Granulocytes: 0.01 10*3/uL (ref 0.00–0.07)
Basophils Absolute: 0 10*3/uL (ref 0.0–0.1)
Basophils Relative: 1 %
Eosinophils Absolute: 0.3 10*3/uL (ref 0.0–0.5)
Eosinophils Relative: 5 %
HCT: 38.8 % (ref 36.0–46.0)
Hemoglobin: 12.7 g/dL (ref 12.0–15.0)
Immature Granulocytes: 0 %
Lymphocytes Relative: 35 %
Lymphs Abs: 2.1 10*3/uL (ref 0.7–4.0)
MCH: 28.7 pg (ref 26.0–34.0)
MCHC: 32.7 g/dL (ref 30.0–36.0)
MCV: 87.8 fL (ref 80.0–100.0)
Monocytes Absolute: 0.8 10*3/uL (ref 0.1–1.0)
Monocytes Relative: 13 %
Neutro Abs: 2.9 10*3/uL (ref 1.7–7.7)
Neutrophils Relative %: 46 %
Platelets: 272 10*3/uL (ref 150–400)
RBC: 4.42 MIL/uL (ref 3.87–5.11)
RDW: 14.4 % (ref 11.5–15.5)
WBC: 6.1 10*3/uL (ref 4.0–10.5)
nRBC: 0 % (ref 0.0–0.2)

## 2019-03-28 LAB — MAGNESIUM: Magnesium: 2 mg/dL (ref 1.7–2.4)

## 2019-03-28 LAB — COMPREHENSIVE METABOLIC PANEL
ALT: 15 U/L (ref 0–44)
AST: 17 U/L (ref 15–41)
Albumin: 3.4 g/dL — ABNORMAL LOW (ref 3.5–5.0)
Alkaline Phosphatase: 37 U/L — ABNORMAL LOW (ref 38–126)
Anion gap: 10 (ref 5–15)
BUN: 8 mg/dL (ref 8–23)
CO2: 26 mmol/L (ref 22–32)
Calcium: 9.7 mg/dL (ref 8.9–10.3)
Chloride: 110 mmol/L (ref 98–111)
Creatinine, Ser: 0.66 mg/dL (ref 0.44–1.00)
GFR calc Af Amer: >60 mL/min (ref >60)
GFR calc non Af Amer: >60 mL/min (ref >60)
Glucose, Bld: 101 mg/dL — ABNORMAL HIGH (ref 70–99)
Potassium: 3.5 mmol/L (ref 3.5–5.1)
Sodium: 146 mmol/L — ABNORMAL HIGH (ref 135–145)
Total Bilirubin: 0.8 mg/dL (ref 0.3–1.2)
Total Protein: 6.7 g/dL (ref 6.5–8.1)

## 2019-03-28 NOTE — TOC Progression Note (Signed)
Transition of Care [facility]) - Progression Note    Patient Details  Name: Marie Woods MRN: [id number] Date of Birth: Nov 16, 1955  Transition of Care [facility]) CM/SW Contact  Mearl Latin, [place] Phone Number: 03/28/2019, 10:42 AM  Clinical Narrative:    [facility] declined patient. CSW faxed referral to [facility] to see if they are accepting patients again.    Expected Discharge Plan: Skilled Nursing Facility Barriers to Discharge: No SNF bed, SNF Pending payor source - LOG  Expected Discharge Plan and Services Expected Discharge Plan: Skilled Nursing Facility In-house Referral: Clinical Social Work Discharge Planning Services: NA Post Acute Care Choice: Skilled Nursing Facility Living arrangements for the past 2 months: Apartment                 DME Arranged: N/A DME Agency: NA                   Social Determinants of Health (SDOH) Interventions    Readmission Risk Interventions No flowsheet data found.

## 2019-03-28 NOTE — Progress Notes (Signed)
Marie Woods  PROGRESS NOTE    Marie Woods  OHY:[id number] DOB: [birth year] DOA: 12/13/2018 PCP: Marcine Matar, MD   Brief Narrative:   64 year old female with history of asthma, hypertension, alcohol abuse was brought to the hospital for confusion and altered mental status in November 2020. Found to have severe sepsis due to UTI which was treated. Confusion continued and MRI was suggestive Wernicke's encephalopathy which has been treated with thiamine supplementation.  Patient has been seen by PT and OT - recommended SNF-awaiting placement  03/27/19:Lying in bed this morning peacefully. She denies complaints. Says she needs nothing right now. No acute events ON.    Assessment & Plan:   Principal Problem:   AMS (altered mental status) Active Problems:   History of alcohol use   TOBACCO ABUSE   Essential hypertension   Confusion   Acute metabolic encephalopathy   Agitation   Acute lower UTI   Sepsis (HCC)   Sepsis secondary to UTI (HCC)   Normocytic anemia  Acute metabolic encephalopathy suspected Wernicke's encephalopathy - History of confabulating  - CT scan showing atrophy and MRI revealing symmetric diffusion weighted and T2/FLAIR hyperintense signal abnormality within the medial thalami. - s/p IV thiamine, continuing oral thiamine, folic acid, multivitamin - Patient will need outpatient follow-up with neurology and consideration of neuropsychiatry evaluation.  - Continue Haldol and Ativan as needed for extreme agitation; hydroxyzine for anxiety - working on placement, SNF rec'd d/t STM issues/dysfxn  Severe sepsis - Felt to be secondary to urinary etiology/UTI - urine culture negative, Chest x-ray negative for acute infiltrate - Treated with supportive care including IV fluids and antibiotics ceftriaxone during initial hospital course  Essential hypertension - continue norvasc - 03/25/19: BP was up a little bit this morning,  continue current Tx, but monitor, may need increase to 10 of norvasc  Alcohol abuse - EtOH level negative on admission, UDS negative.  - no evidence of withdrawal, CIWA DC'd - continue thiamine, folate, multivitamin  Tobacco abuse - Nicotine patch, will continue to avoid precipitating withdrawal  Normocytic anemia - possibly due to nutritional deficiencies - Resolved with improved oral intake.  - Last hgb check on 2/10 with improved and stable Hgb at 12.9  Constipation - continue senakot  Generalized weakness:  - PT/OT recommending SNF  She is medically stable for discharge whenever a bed can be secured. She is lying in bed peacefully this AM and denies any needs.    DVT prophylaxis: lovenox Code Status: FULL Family Communication: None at bedside   Disposition Plan: Medically stable for discharge when bed is secured. She is unable to discharge to self to be being unsafe d/t STM issues.   Subjective: "No, thanks. I'm ok."  Objective: Vitals:   03/27/19 0734 03/27/19 1638 03/27/19 2333 03/28/19 0834  BP: 106/76 108/72 128/78 121/81  Pulse:  77 70 69  Resp: 19 19 20 16   Temp: 98 F (36.7 C) 98.7 F (37.1 C) 98.5 F (36.9 C) 98.5 F (36.9 C)  TempSrc:      SpO2:  100% 97% 97%  Weight:      Height:       No intake or output data in the 24 hours ending 03/28/19 1239 Filed Weights   12/15/18 0101 02/22/19 0359  Weight: 98.4 kg 67.1 kg    Examination:  General: 64 y.o. female resting in bed in NAD Cardiovascular: RRR, +S1, S2, no m/g/r, equal pulses throughout Respiratory: CTABL, no w/r/r, normal WOB GI: BS+, NDNT, no masses  noted, no organomegaly noted MSK: No e/c/c Neuro:alert to name, follows commands   Data Reviewed: I have personally reviewed following labs and imaging studies.  CBC: Recent Labs  Lab 03/28/19 0150  WBC 6.1  NEUTROABS 2.9  HGB 12.7  HCT 38.8  MCV 87.8  PLT 347   Basic Metabolic  Panel: Recent Labs  Lab 03/28/19 0150  NA 146*  K 3.5  CL 110  CO2 26  GLUCOSE 101*  BUN 8  CREATININE 0.66  CALCIUM 9.7  MG 2.0   GFR: Estimated Creatinine Clearance: 71.8 mL/min (by C-G formula based on SCr of 0.66 mg/dL). Liver Function Tests: Recent Labs  Lab 03/28/19 0150  AST 17  ALT 15  ALKPHOS 37*  BILITOT 0.8  PROT 6.7  ALBUMIN 3.4*   No results for input(s): LIPASE, AMYLASE in the last 168 hours. No results for input(s): AMMONIA in the last 168 hours. Coagulation Profile: No results for input(s): INR, PROTIME in the last 168 hours. Cardiac Enzymes: No results for input(s): CKTOTAL, CKMB, CKMBINDEX, TROPONINI in the last 168 hours. BNP (last 3 results) No results for input(s): PROBNP in the last 8760 hours. HbA1C: No results for input(s): HGBA1C in the last 72 hours. CBG: No results for input(s): GLUCAP in the last 168 hours. Lipid Profile: No results for input(s): CHOL, HDL, LDLCALC, TRIG, CHOLHDL, LDLDIRECT in the last 72 hours. Thyroid Function Tests: No results for input(s): TSH, T4TOTAL, FREET4, T3FREE, THYROIDAB in the last 72 hours. Anemia Panel: No results for input(s): VITAMINB12, FOLATE, FERRITIN, TIBC, IRON, RETICCTPCT in the last 72 hours. Sepsis Labs: No results for input(s): PROCALCITON, LATICACIDVEN in the last 168 hours.  No results found for this or any previous visit (from the past 240 hour(s)).    Radiology Studies: No results found.   Scheduled Meds: . amLODipine  5 mg Oral Daily  . enoxaparin (LOVENOX) injection  40 mg Subcutaneous Q24H  . feeding supplement (ENSURE ENLIVE)  237 mL Oral BID BM  . folic acid  1 mg Oral Daily  . ketoconazole   Topical BID  . multivitamin with minerals  1 tablet Oral Daily  . nicotine  14 mg Transdermal Daily  . polyethylene glycol  17 g Oral BID  . senna-docusate  1 tablet Oral BID  . thiamine  100 mg Oral Daily   Continuous Infusions:   LOS: 105 days    Time spent: 15 minutes spent  in the coordination of care today.    Jonnie Finner, DO [facility]  If 7PM-7AM, please contact night-coverage [email or web address] 03/28/2019, 12:39 PM

## 2019-03-29 NOTE — Progress Notes (Signed)
PROGRESS NOTE    HETVI SHAWHAN  JSE:[id number] DOB: 21-Dec-1955 DOA: 12/13/2018 PCP: Ladell Pier, MD     Brief Narrative:  Marie Woods is a 64 year old female with history of asthma, hypertension, alcohol abuse who was brought to the hospital for confusion and altered mental status in November 2020. Found to have severe sepsis due to UTI which was treated. Confusion continued and MRI was suggestive Wernicke's encephalopathy which has been treated with thiamine supplementation. Currently awaiting SNF placement.   New events last 24 hours / Subjective: No acute events noted. Patient without physical complaints today.   Assessment & Plan:   Principal Problem:   AMS (altered mental status) Active Problems:   History of alcohol use   TOBACCO ABUSE   Essential hypertension   Confusion   Acute metabolic encephalopathy   Agitation   Acute lower UTI   Sepsis (HCC)   Sepsis secondary to UTI (HCC)   Normocytic anemia   Acute metabolic encephalopathy secondary to suspected Wernicke's encephalopathy -CT scan showing atrophy and MRI revealing symmetric diffusion weighted and T2/FLAIR hyperintense signal abnormality within the medial thalami. -S/p IV thiamine, continuing oral thiamine, folic acid, multivitamin -Patient will need outpatient follow-up with neurology and consideration of neuropsychiatry evaluation.  -Continue Haldol and Ativan as needed for extreme agitation; hydroxyzine for anxiety  Severe sepsis secondary to UTI -Resolved. Completed antibiotic tx   Essential hypertension -Continue norvasc  Alcohol abuse -Continue thiamine, folate, multivitamin  Tobacco abuse -Nicotine patch   DVT prophylaxis: Lovenox  Code Status: Full Family Communication: None at bedside Disposition Plan:  . Patient is from home prior to admission. . Other barrier(s) to discharge include SNF pending payor source, no SNF bed available. Medically stable for discharge from  hospital. Awaiting placement.  . Suspect patient will discharge to SNF once placement found.    Antimicrobials:  Anti-infectives (From admission, onward)   Start     Dose/Rate Route Frequency Ordered Stop   12/14/18 0100  cefTRIAXone (ROCEPHIN) 1 g in sodium chloride 0.9 % 100 mL IVPB  Status:  Discontinued     1 g 200 mL/hr over 30 Minutes Intravenous Every 24 hours 12/14/18 0010 12/17/18 0948        Objective: Vitals:   03/28/19 1823 03/28/19 2306 03/29/19 1051 03/29/19 1052  BP: (!) 132/91 113/72 93/71 94/62   Pulse: 77 76 70 60  Resp:   18   Temp: (!) 97.4 F (36.3 C) 98.5 F (36.9 C) 98 F (36.7 C)   TempSrc: Oral     SpO2: 100% 97% 100% 96%  Weight:      Height:       No intake or output data in the 24 hours ending 03/29/19 1053 Filed Weights   12/15/18 0101 02/22/19 0359  Weight: 98.4 kg 67.1 kg    Examination:  General exam: Appears calm and comfortable  Respiratory system: Clear to auscultation. Respiratory effort normal. No respiratory distress.  Cardiovascular system: S1 & S2 heard, RRR. No murmurs. No pedal edema. Gastrointestinal system: Abdomen is nondistended, soft and nontender. Central nervous system: Alert  Extremities: Symmetric in appearance    Data Reviewed: I have personally reviewed following labs and imaging studies  CBC: Recent Labs  Lab 03/28/19 0150  WBC 6.1  NEUTROABS 2.9  HGB 12.7  HCT 38.8  MCV 87.8  PLT 073   Basic Metabolic Panel: Recent Labs  Lab 03/28/19 0150  NA 146*  K 3.5  CL 110  CO2 26  GLUCOSE  101*  BUN 8  CREATININE 0.66  CALCIUM 9.7  MG 2.0   GFR: Estimated Creatinine Clearance: 71.8 mL/min (by C-G formula based on SCr of 0.66 mg/dL). Liver Function Tests: Recent Labs  Lab 03/28/19 0150  AST 17  ALT 15  ALKPHOS 37*  BILITOT 0.8  PROT 6.7  ALBUMIN 3.4*   No results for input(s): LIPASE, AMYLASE in the last 168 hours. No results for input(s): AMMONIA in the last 168 hours. Coagulation  Profile: No results for input(s): INR, PROTIME in the last 168 hours. Cardiac Enzymes: No results for input(s): CKTOTAL, CKMB, CKMBINDEX, TROPONINI in the last 168 hours. BNP (last 3 results) No results for input(s): PROBNP in the last 8760 hours. HbA1C: No results for input(s): HGBA1C in the last 72 hours. CBG: No results for input(s): GLUCAP in the last 168 hours. Lipid Profile: No results for input(s): CHOL, HDL, LDLCALC, TRIG, CHOLHDL, LDLDIRECT in the last 72 hours. Thyroid Function Tests: No results for input(s): TSH, T4TOTAL, FREET4, T3FREE, THYROIDAB in the last 72 hours. Anemia Panel: No results for input(s): VITAMINB12, FOLATE, FERRITIN, TIBC, IRON, RETICCTPCT in the last 72 hours. Sepsis Labs: No results for input(s): PROCALCITON, LATICACIDVEN in the last 168 hours.  No results found for this or any previous visit (from the past 240 hour(s)).    Radiology Studies: No results found.    Scheduled Meds: . amLODipine  5 mg Oral Daily  . enoxaparin (LOVENOX) injection  40 mg Subcutaneous Q24H  . feeding supplement (ENSURE ENLIVE)  237 mL Oral BID BM  . folic acid  1 mg Oral Daily  . ketoconazole   Topical BID  . multivitamin with minerals  1 tablet Oral Daily  . nicotine  14 mg Transdermal Daily  . polyethylene glycol  17 g Oral BID  . senna-docusate  1 tablet Oral BID  . thiamine  100 mg Oral Daily   Continuous Infusions:   LOS: 106 days      Time spent: 35 minutes   Noralee Stain, DO [facility] 03/29/2019, 10:53 AM   Available via Epic secure chat 7am-7pm After these hours, please refer to coverage provider listed on [email or web address]

## 2019-03-30 DIAGNOSIS — E512 Wernicke's encephalopathy: Secondary | ICD-10-CM

## 2019-03-30 NOTE — Progress Notes (Signed)
PROGRESS NOTE    Marie Woods  [id number]:[id number] DOB: Nov 13, 1955 DOA: 12/13/2018 PCP: Marcine Matar, MD     Brief Narrative:  Marie Woods is a 64 year old female with history of asthma, hypertension, alcohol abuse who was brought to the hospital for confusion and altered mental status in November 2020. Found to have severe sepsis due to UTI which was treated. Confusion continued and MRI was suggestive Wernicke's encephalopathy which has been treated with thiamine supplementation. Currently awaiting SNF placement.   New events last 24 hours / Subjective: Had low SBP this morning, 89, Norvasc held.  No other issues.  Patient without complaints this morning.  Assessment & Plan:   Principal Problem:   Wernicke encephalopathy Active Problems:   History of alcohol use   TOBACCO ABUSE   Essential hypertension   AMS (altered mental status)   Confusion   Acute metabolic encephalopathy   Agitation   Acute lower UTI   Sepsis (HCC)   Sepsis secondary to UTI (HCC)   Normocytic anemia   Acute metabolic encephalopathy secondary to suspected Wernicke's encephalopathy -CT scan showing atrophy and MRI revealing symmetric diffusion weighted and T2/FLAIR hyperintense signal abnormality within the medial thalami. -S/p IV thiamine, continuing oral thiamine, folic acid, multivitamin -Patient will need outpatient follow-up with neurology and consideration of neuropsychiatry evaluation.  -Continue Haldol and Ativan as needed for extreme agitation; hydroxyzine for anxiety  Severe sepsis secondary to UTI -Resolved. Completed antibiotic tx   Essential hypertension -Continue norvasc -on hold this morning  Alcohol abuse -Continue thiamine, folate, multivitamin  Tobacco abuse -Nicotine patch   DVT prophylaxis: Lovenox  Code Status: Full Family Communication: None at bedside Disposition Plan:  . Patient is from home prior to admission. . Other barrier(s) to discharge include SNF  pending payor source, no SNF bed available. Medically stable for discharge from hospital. Awaiting placement.  . Suspect patient will discharge to SNF once placement found.    Antimicrobials:  Anti-infectives (From admission, onward)   Start     Dose/Rate Route Frequency Ordered Stop   12/14/18 0100  cefTRIAXone (ROCEPHIN) 1 g in sodium chloride 0.9 % 100 mL IVPB  Status:  Discontinued     1 g 200 mL/hr over 30 Minutes Intravenous Every 24 hours 12/14/18 0010 12/17/18 0948       Objective: Vitals:   03/29/19 1052 03/29/19 1824 03/29/19 1949 03/30/19 0730  BP: 94/62 121/83 108/85 (!) 89/53  Pulse: 60 79 68 63  Resp:   16 18  Temp:  97.8 F (36.6 C) 98 F (36.7 C) 98.1 F (36.7 C)  TempSrc:   Oral   SpO2: 96% 100% 100% 95%  Weight:      Height:        Intake/Output Summary (Last 24 hours) at 03/30/2019 1032 Last data filed at 03/30/2019 7564 Gross per 24 hour  Intake 240 ml  Output -  Net 240 ml   Filed Weights   12/15/18 0101 02/22/19 0359  Weight: 98.4 kg 67.1 kg    Examination: General exam: Appears calm and comfortable    Data Reviewed: I have personally reviewed following labs and imaging studies  CBC: Recent Labs  Lab 03/28/19 0150  WBC 6.1  NEUTROABS 2.9  HGB 12.7  HCT 38.8  MCV 87.8  PLT 272   Basic Metabolic Panel: Recent Labs  Lab 03/28/19 0150  NA 146*  K 3.5  CL 110  CO2 26  GLUCOSE 101*  BUN 8  CREATININE 0.66  CALCIUM  9.7  MG 2.0   GFR: Estimated Creatinine Clearance: 71.8 mL/min (by C-G formula based on SCr of 0.66 mg/dL). Liver Function Tests: Recent Labs  Lab 03/28/19 0150  AST 17  ALT 15  ALKPHOS 37*  BILITOT 0.8  PROT 6.7  ALBUMIN 3.4*   No results for input(s): LIPASE, AMYLASE in the last 168 hours. No results for input(s): AMMONIA in the last 168 hours. Coagulation Profile: No results for input(s): INR, PROTIME in the last 168 hours. Cardiac Enzymes: No results for input(s): CKTOTAL, CKMB, CKMBINDEX, TROPONINI  in the last 168 hours. BNP (last 3 results) No results for input(s): PROBNP in the last 8760 hours. HbA1C: No results for input(s): HGBA1C in the last 72 hours. CBG: No results for input(s): GLUCAP in the last 168 hours. Lipid Profile: No results for input(s): CHOL, HDL, LDLCALC, TRIG, CHOLHDL, LDLDIRECT in the last 72 hours. Thyroid Function Tests: No results for input(s): TSH, T4TOTAL, FREET4, T3FREE, THYROIDAB in the last 72 hours. Anemia Panel: No results for input(s): VITAMINB12, FOLATE, FERRITIN, TIBC, IRON, RETICCTPCT in the last 72 hours. Sepsis Labs: No results for input(s): PROCALCITON, LATICACIDVEN in the last 168 hours.  No results found for this or any previous visit (from the past 240 hour(s)).    Radiology Studies: No results found.    Scheduled Meds: . amLODipine  5 mg Oral Daily  . enoxaparin (LOVENOX) injection  40 mg Subcutaneous Q24H  . feeding supplement (ENSURE ENLIVE)  237 mL Oral BID BM  . folic acid  1 mg Oral Daily  . ketoconazole   Topical BID  . multivitamin with minerals  1 tablet Oral Daily  . nicotine  14 mg Transdermal Daily  . polyethylene glycol  17 g Oral BID  . senna-docusate  1 tablet Oral BID  . thiamine  100 mg Oral Daily   Continuous Infusions:   LOS: 107 days      Time spent: 15 minutes   Dessa Phi, DO [facility] 03/30/2019, 10:32 AM   Available via Epic secure chat 7am-7pm After these hours, please refer to coverage provider listed on [email or web address]

## 2019-03-31 MED ORDER — AQUAPHOR EX OINT
TOPICAL_OINTMENT | Freq: Two times a day (BID) | CUTANEOUS | Status: DC | PRN
  Filled 2019-03-31: qty 50

## 2019-03-31 NOTE — Progress Notes (Signed)
PROGRESS NOTE    Marie Woods  [id number]:[id number] DOB: 04-25-1955 DOA: 12/13/2018 PCP: Marcine Matar, MD     Brief Narrative:  Marie Woods is a 64 year old female with history of asthma, hypertension, alcohol abuse who was brought to the hospital for confusion and altered mental status in November 2020. Found to have severe sepsis due to UTI which was treated. Confusion continued and MRI was suggestive Wernicke's encephalopathy which has been treated with thiamine supplementation. Currently awaiting SNF placement.   New events last 24 hours / Subjective: No issues reported overnight.  Assessment & Plan:   Principal Problem:   Wernicke encephalopathy Active Problems:   History of alcohol use   TOBACCO ABUSE   Essential hypertension   AMS (altered mental status)   Confusion   Acute metabolic encephalopathy   Agitation   Acute lower UTI   Sepsis (HCC)   Sepsis secondary to UTI (HCC)   Normocytic anemia   Acute metabolic encephalopathy secondary to suspected Wernicke's encephalopathy -CT scan showing atrophy and MRI revealing symmetric diffusion weighted and T2/FLAIR hyperintense signal abnormality within the medial thalami. -S/p IV thiamine, continuing oral thiamine, folic acid, multivitamin -Patient will need outpatient follow-up with neurology and consideration of neuropsychiatry evaluation.  -Continue Haldol and Ativan as needed for extreme agitation; hydroxyzine for anxiety  Severe sepsis secondary to UTI -Resolved. Completed antibiotic tx   Essential hypertension -Continue norvasc -on hold this morning  Alcohol abuse -Continue thiamine, folate, multivitamin  Tobacco abuse -Nicotine patch   DVT prophylaxis: Lovenox  Code Status: Full Family Communication: None at bedside Disposition Plan:  . Patient is from home prior to admission. . Other barrier(s) to discharge include SNF pending payor source, no SNF bed available. Medically stable for discharge  from hospital. Awaiting placement.  . Suspect patient will discharge to SNF once placement found.    Antimicrobials:  Anti-infectives (From admission, onward)   Start     Dose/Rate Route Frequency Ordered Stop   12/14/18 0100  cefTRIAXone (ROCEPHIN) 1 g in sodium chloride 0.9 % 100 mL IVPB  Status:  Discontinued     1 g 200 mL/hr over 30 Minutes Intravenous Every 24 hours 12/14/18 0010 12/17/18 0948       Objective: Vitals:   03/30/19 1241 03/30/19 1647 03/30/19 2259 03/31/19 0830  BP: 113/73 103/71 108/65 93/63  Pulse: 78 84 74 66  Resp: 19 19 17 16   Temp: 97.9 F (36.6 C) 98.1 F (36.7 C) 98.4 F (36.9 C) 98.5 F (36.9 C)  TempSrc:   Oral Oral  SpO2: (!) 86% 98% 97% 98%  Weight:      Height:       No intake or output data in the 24 hours ending 03/31/19 1054 Filed Weights   12/15/18 0101 02/22/19 0359  Weight: 98.4 kg 67.1 kg    Examination: General exam: Appears calm and comfortable   Data Reviewed: I have personally reviewed following labs and imaging studies  CBC: Recent Labs  Lab 03/28/19 0150  WBC 6.1  NEUTROABS 2.9  HGB 12.7  HCT 38.8  MCV 87.8  PLT 272   Basic Metabolic Panel: Recent Labs  Lab 03/28/19 0150  NA 146*  K 3.5  CL 110  CO2 26  GLUCOSE 101*  BUN 8  CREATININE 0.66  CALCIUM 9.7  MG 2.0   GFR: Estimated Creatinine Clearance: 71.8 mL/min (by C-G formula based on SCr of 0.66 mg/dL). Liver Function Tests: Recent Labs  Lab 03/28/19 0150  AST  17  ALT 15  ALKPHOS 37*  BILITOT 0.8  PROT 6.7  ALBUMIN 3.4*   No results for input(s): LIPASE, AMYLASE in the last 168 hours. No results for input(s): AMMONIA in the last 168 hours. Coagulation Profile: No results for input(s): INR, PROTIME in the last 168 hours. Cardiac Enzymes: No results for input(s): CKTOTAL, CKMB, CKMBINDEX, TROPONINI in the last 168 hours. BNP (last 3 results) No results for input(s): PROBNP in the last 8760 hours. HbA1C: No results for input(s): HGBA1C  in the last 72 hours. CBG: No results for input(s): GLUCAP in the last 168 hours. Lipid Profile: No results for input(s): CHOL, HDL, LDLCALC, TRIG, CHOLHDL, LDLDIRECT in the last 72 hours. Thyroid Function Tests: No results for input(s): TSH, T4TOTAL, FREET4, T3FREE, THYROIDAB in the last 72 hours. Anemia Panel: No results for input(s): VITAMINB12, FOLATE, FERRITIN, TIBC, IRON, RETICCTPCT in the last 72 hours. Sepsis Labs: No results for input(s): PROCALCITON, LATICACIDVEN in the last 168 hours.  No results found for this or any previous visit (from the past 240 hour(s)).    Radiology Studies: No results found.    Scheduled Meds: . amLODipine  5 mg Oral Daily  . enoxaparin (LOVENOX) injection  40 mg Subcutaneous Q24H  . feeding supplement (ENSURE ENLIVE)  237 mL Oral BID BM  . folic acid  1 mg Oral Daily  . ketoconazole   Topical BID  . multivitamin with minerals  1 tablet Oral Daily  . nicotine  14 mg Transdermal Daily  . polyethylene glycol  17 g Oral BID  . senna-docusate  1 tablet Oral BID  . thiamine  100 mg Oral Daily   Continuous Infusions:   LOS: 108 days      Time spent: 68minutes   Dessa Phi, DO [facility] 03/31/2019, 10:54 AM   Available via Epic secure chat 7am-7pm After these hours, please refer to coverage provider listed on [email or web address]

## 2019-04-01 NOTE — Plan of Care (Signed)
  Problem: Education: Goal: Knowledge of General Education information will improve Description: Including pain rating scale, medication(s)/side effects and non-pharmacologic comfort measures Outcome: Progressing   Problem: Clinical Measurements: Goal: Respiratory complications will improve Outcome: Progressing Goal: Cardiovascular complication will be avoided Outcome: Progressing   Problem: Activity: Goal: Risk for activity intolerance will decrease Outcome: Progressing   Problem: Nutrition: Goal: Adequate nutrition will be maintained Outcome: Progressing   Problem: Coping: Goal: Level of anxiety will decrease Outcome: Progressing   

## 2019-04-01 NOTE — Progress Notes (Signed)
PROGRESS NOTE    Marie Woods  XBJ:[id number] DOB: 1955/02/28 DOA: 12/13/2018 PCP: Ladell Pier, MD     Brief Narrative:  Marie Woods is a 64 year old female with history of asthma, hypertension, alcohol abuse who was brought to the hospital for confusion and altered mental status in November 2020. Found to have severe sepsis due to UTI which was treated. Confusion continued and MRI was suggestive Wernicke's encephalopathy which has been treated with thiamine supplementation. Currently awaiting SNF placement.   New events last 24 hours / Subjective: Nothing new to report   Assessment & Plan:   Principal Problem:   Wernicke encephalopathy Active Problems:   History of alcohol use   TOBACCO ABUSE   Essential hypertension   AMS (altered mental status)   Confusion   Acute metabolic encephalopathy   Agitation   Acute lower UTI   Sepsis (HCC)   Sepsis secondary to UTI (HCC)   Normocytic anemia   Acute metabolic encephalopathy secondary to suspected Wernicke's encephalopathy -CT scan showing atrophy and MRI revealing symmetric diffusion weighted and T2/FLAIR hyperintense signal abnormality within the medial thalami. -S/p IV thiamine, continuing oral thiamine, folic acid, multivitamin -Patient will need outpatient follow-up with neurology and consideration of neuropsychiatry evaluation.  -Continue Haldol and Ativan as needed for extreme agitation; hydroxyzine for anxiety  Severe sepsis secondary to UTI -Resolved. Completed antibiotic tx   Essential hypertension -Continue norvasc -on hold this morning  Alcohol abuse -Continue thiamine, folate, multivitamin  Tobacco abuse -Nicotine patch   DVT prophylaxis: Lovenox  Code Status: Full Family Communication: None at bedside Disposition Plan:  . Patient is from home prior to admission. . Other barrier(s) to discharge include SNF pending payor source, no SNF bed available. Medically stable for discharge from  hospital. Awaiting placement.  . Suspect patient will discharge to SNF once placement found.    Antimicrobials:  Anti-infectives (From admission, onward)   Start     Dose/Rate Route Frequency Ordered Stop   12/14/18 0100  cefTRIAXone (ROCEPHIN) 1 g in sodium chloride 0.9 % 100 mL IVPB  Status:  Discontinued     1 g 200 mL/hr over 30 Minutes Intravenous Every 24 hours 12/14/18 0010 12/17/18 0948       Objective: Vitals:   03/31/19 0830 03/31/19 1646 03/31/19 2148 04/01/19 0801  BP: 93/63 105/86 99/78 93/83   Pulse: 66 82 84 75  Resp: 16 18 16 16   Temp: 98.5 F (36.9 C) 98.4 F (36.9 C) 97.6 F (36.4 C) 98.6 F (37 C)  TempSrc: Oral Oral    SpO2: 98%  98% 98%  Weight:      Height:       No intake or output data in the 24 hours ending 04/01/19 1030 Filed Weights   12/15/18 0101 02/22/19 0359  Weight: 98.4 kg 67.1 kg    Examination: General exam: Appears calm and comfortable   Data Reviewed: I have personally reviewed following labs and imaging studies  CBC: Recent Labs  Lab 03/28/19 0150  WBC 6.1  NEUTROABS 2.9  HGB 12.7  HCT 38.8  MCV 87.8  PLT 308   Basic Metabolic Panel: Recent Labs  Lab 03/28/19 0150  NA 146*  K 3.5  CL 110  CO2 26  GLUCOSE 101*  BUN 8  CREATININE 0.66  CALCIUM 9.7  MG 2.0   GFR: Estimated Creatinine Clearance: 71.8 mL/min (by C-G formula based on SCr of 0.66 mg/dL). Liver Function Tests: Recent Labs  Lab 03/28/19 0150  AST  17  ALT 15  ALKPHOS 37*  BILITOT 0.8  PROT 6.7  ALBUMIN 3.4*   No results for input(s): LIPASE, AMYLASE in the last 168 hours. No results for input(s): AMMONIA in the last 168 hours. Coagulation Profile: No results for input(s): INR, PROTIME in the last 168 hours. Cardiac Enzymes: No results for input(s): CKTOTAL, CKMB, CKMBINDEX, TROPONINI in the last 168 hours. BNP (last 3 results) No results for input(s): PROBNP in the last 8760 hours. HbA1C: No results for input(s): HGBA1C in the last 72  hours. CBG: No results for input(s): GLUCAP in the last 168 hours. Lipid Profile: No results for input(s): CHOL, HDL, LDLCALC, TRIG, CHOLHDL, LDLDIRECT in the last 72 hours. Thyroid Function Tests: No results for input(s): TSH, T4TOTAL, FREET4, T3FREE, THYROIDAB in the last 72 hours. Anemia Panel: No results for input(s): VITAMINB12, FOLATE, FERRITIN, TIBC, IRON, RETICCTPCT in the last 72 hours. Sepsis Labs: No results for input(s): PROCALCITON, LATICACIDVEN in the last 168 hours.  No results found for this or any previous visit (from the past 240 hour(s)).    Radiology Studies: No results found.    Scheduled Meds: . amLODipine  5 mg Oral Daily  . enoxaparin (LOVENOX) injection  40 mg Subcutaneous Q24H  . feeding supplement (ENSURE ENLIVE)  237 mL Oral BID BM  . folic acid  1 mg Oral Daily  . ketoconazole   Topical BID  . multivitamin with minerals  1 tablet Oral Daily  . nicotine  14 mg Transdermal Daily  . polyethylene glycol  17 g Oral BID  . senna-docusate  1 tablet Oral BID  . thiamine  100 mg Oral Daily   Continuous Infusions:   LOS: 109 days      Time spent:   Noralee Stain, DO [facility] 04/01/2019, 10:30 AM   Available via Epic secure chat 7am-7pm After these hours, please refer to coverage provider listed on [email or web address]

## 2019-04-02 NOTE — Progress Notes (Signed)
PROGRESS NOTE    Marie Woods  KDX:[id number] DOB: [birth year] DOA: 12/13/2018 PCP: Ladell Pier, MD     Brief Narrative:  Marie Woods is a 64 year old female with history of asthma, hypertension, alcohol abuse who was brought to the hospital for confusion and altered mental status in November 2020. Found to have severe sepsis due to UTI which was treated. Confusion continued and MRI was suggestive Wernicke's encephalopathy which has been treated with thiamine supplementation. Currently awaiting SNF placement.   New events last 24 hours / Subjective: No new events  Assessment & Plan:   Principal Problem:   Wernicke encephalopathy Active Problems:   History of alcohol use   TOBACCO ABUSE   Essential hypertension   AMS (altered mental status)   Confusion   Acute metabolic encephalopathy   Agitation   Acute lower UTI   Sepsis (HCC)   Sepsis secondary to UTI (HCC)   Normocytic anemia   Acute metabolic encephalopathy secondary to suspected Wernicke's encephalopathy -CT scan showing atrophy and MRI revealing symmetric diffusion weighted and T2/FLAIR hyperintense signal abnormality within the medial thalami. -S/p IV thiamine, continuing oral thiamine, folic acid, multivitamin -Patient will need outpatient follow-up with neurology and consideration of neuropsychiatry evaluation.  -Continue Haldol and Ativan as needed for extreme agitation; hydroxyzine for anxiety  Severe sepsis secondary to UTI -Resolved. Completed antibiotic tx   Essential hypertension -Continue norvasc  Alcohol abuse -Continue thiamine, folate, multivitamin  Tobacco abuse -Nicotine patch   DVT prophylaxis: Lovenox  Code Status: Full Family Communication: None at bedside Disposition Plan:  . Patient is from home prior to admission. . Other barrier(s) to discharge include SNF pending payor source, no SNF bed available. Medically stable for discharge from hospital. Awaiting placement.   . Suspect patient will discharge to SNF once placement found.    Antimicrobials:  Anti-infectives (From admission, onward)   Start     Dose/Rate Route Frequency Ordered Stop   12/14/18 0100  cefTRIAXone (ROCEPHIN) 1 g in sodium chloride 0.9 % 100 mL IVPB  Status:  Discontinued     1 g 200 mL/hr over 30 Minutes Intravenous Every 24 hours 12/14/18 0010 12/17/18 0948       Objective: Vitals:   04/01/19 1552 04/01/19 2104 04/02/19 0731 04/02/19 0800  BP: 120/90 106/79 (!) 80/53 96/77  Pulse: 79 80 64 77  Resp: 16 16 16 18   Temp: 97.9 F (36.6 C) 98.5 F (36.9 C) 98.1 F (36.7 C)   TempSrc:   Oral   SpO2: 100% 100% 96% 98%  Weight:      Height:       No intake or output data in the 24 hours ending 04/02/19 0924 Filed Weights   12/15/18 0101 02/22/19 0359  Weight: 98.4 kg 67.1 kg    Examination: General exam: Appears calm and comfortable    Data Reviewed: I have personally reviewed following labs and imaging studies  CBC: Recent Labs  Lab 03/28/19 0150  WBC 6.1  NEUTROABS 2.9  HGB 12.7  HCT 38.8  MCV 87.8  PLT 976   Basic Metabolic Panel: Recent Labs  Lab 03/28/19 0150  NA 146*  K 3.5  CL 110  CO2 26  GLUCOSE 101*  BUN 8  CREATININE 0.66  CALCIUM 9.7  MG 2.0   GFR: Estimated Creatinine Clearance: 71.8 mL/min (by C-G formula based on SCr of 0.66 mg/dL). Liver Function Tests: Recent Labs  Lab 03/28/19 0150  AST 17  ALT 15  ALKPHOS 37*  BILITOT 0.8  PROT 6.7  ALBUMIN 3.4*   No results for input(s): LIPASE, AMYLASE in the last 168 hours. No results for input(s): AMMONIA in the last 168 hours. Coagulation Profile: No results for input(s): INR, PROTIME in the last 168 hours. Cardiac Enzymes: No results for input(s): CKTOTAL, CKMB, CKMBINDEX, TROPONINI in the last 168 hours. BNP (last 3 results) No results for input(s): PROBNP in the last 8760 hours. HbA1C: No results for input(s): HGBA1C in the last 72 hours. CBG: No results for input(s):  GLUCAP in the last 168 hours. Lipid Profile: No results for input(s): CHOL, HDL, LDLCALC, TRIG, CHOLHDL, LDLDIRECT in the last 72 hours. Thyroid Function Tests: No results for input(s): TSH, T4TOTAL, FREET4, T3FREE, THYROIDAB in the last 72 hours. Anemia Panel: No results for input(s): VITAMINB12, FOLATE, FERRITIN, TIBC, IRON, RETICCTPCT in the last 72 hours. Sepsis Labs: No results for input(s): PROCALCITON, LATICACIDVEN in the last 168 hours.  No results found for this or any previous visit (from the past 240 hour(s)).    Radiology Studies: No results found.    Scheduled Meds: . amLODipine  5 mg Oral Daily  . enoxaparin (LOVENOX) injection  40 mg Subcutaneous Q24H  . feeding supplement (ENSURE ENLIVE)  237 mL Oral BID BM  . folic acid  1 mg Oral Daily  . ketoconazole   Topical BID  . multivitamin with minerals  1 tablet Oral Daily  . nicotine  14 mg Transdermal Daily  . polyethylene glycol  17 g Oral BID  . senna-docusate  1 tablet Oral BID  . thiamine  100 mg Oral Daily   Continuous Infusions:   LOS: 110 days      Time spent:   Noralee Stain, DO [facility] 04/02/2019, 9:24 AM   Available via Epic secure chat 7am-7pm After these hours, please refer to coverage provider listed on [email or web address]

## 2019-04-03 LAB — CBC
HCT: 38.5 % (ref 36.0–46.0)
Hemoglobin: 12.6 g/dL (ref 12.0–15.0)
MCH: 28.7 pg (ref 26.0–34.0)
MCHC: 32.7 g/dL (ref 30.0–36.0)
MCV: 87.7 fL (ref 80.0–100.0)
Platelets: 279 10*3/uL (ref 150–400)
RBC: 4.39 MIL/uL (ref 3.87–5.11)
RDW: 14.7 % (ref 11.5–15.5)
WBC: 6.7 10*3/uL (ref 4.0–10.5)
nRBC: 0 % (ref 0.0–0.2)

## 2019-04-03 LAB — BASIC METABOLIC PANEL
Anion gap: 12 (ref 5–15)
BUN: 14 mg/dL (ref 8–23)
CO2: 29 mmol/L (ref 22–32)
Calcium: 9.9 mg/dL (ref 8.9–10.3)
Chloride: 104 mmol/L (ref 98–111)
Creatinine, Ser: 0.74 mg/dL (ref 0.44–1.00)
GFR calc Af Amer: >60 mL/min (ref >60)
GFR calc non Af Amer: >60 mL/min (ref >60)
Glucose, Bld: 96 mg/dL (ref 70–99)
Potassium: 3.8 mmol/L (ref 3.5–5.1)
Sodium: 145 mmol/L (ref 135–145)

## 2019-04-03 MED ORDER — DIPHENHYDRAMINE HCL 25 MG PO CAPS
25.0000 mg | ORAL_CAPSULE | Freq: Four times a day (QID) | ORAL | Status: DC | PRN
  Administered 2019-04-03 – 2019-04-25 (×9): 25 mg via ORAL
  Filled 2019-04-03 (×11): qty 1

## 2019-04-03 NOTE — Progress Notes (Addendum)
Benadryl given for c/o itching with relief stated.

## 2019-04-03 NOTE — Progress Notes (Signed)
PROGRESS NOTE    Marie Woods  RCV:[id number] DOB: [birth year] DOA: 12/13/2018 PCP: Marcine Matar, MD     Brief Narrative:  Marie Woods is a 64 year old female with history of asthma, hypertension, alcohol abuse who was brought to the hospital for confusion and altered mental status in November 2020. Found to have severe sepsis due to UTI which was treated. Confusion continued and MRI was suggestive Wernicke's encephalopathy which has been treated with thiamine supplementation. Currently awaiting SNF placement.   New events last 24 hours / Subjective: No complaints on exam   Assessment & Plan:   Principal Problem:   Wernicke encephalopathy Active Problems:   History of alcohol use   TOBACCO ABUSE   Essential hypertension   AMS (altered mental status)   Confusion   Acute metabolic encephalopathy   Agitation   Acute lower UTI   Sepsis (HCC)   Sepsis secondary to UTI (HCC)   Normocytic anemia   Acute metabolic encephalopathy secondary to suspected Wernicke's encephalopathy -CT scan showing atrophy and MRI revealing symmetric diffusion weighted and T2/FLAIR hyperintense signal abnormality within the medial thalami. -S/p IV thiamine, continuing oral thiamine, folic acid, multivitamin -Patient will need outpatient follow-up with neurology and consideration of neuropsychiatry evaluation.  -Continue Haldol and Ativan as needed for extreme agitation; hydroxyzine for anxiety  Severe sepsis secondary to UTI -Resolved. Completed antibiotic tx   Essential hypertension -Continue norvasc  Alcohol abuse -Continue thiamine, folate, multivitamin  Tobacco abuse -Nicotine patch   DVT prophylaxis: Lovenox  Code Status: Full Family Communication: None at bedside Disposition Plan:  . Patient is from home prior to admission. . Other barrier(s) to discharge include SNF pending payor source, no SNF bed available. Medically stable for discharge from hospital. Awaiting  placement.  . Suspect patient will discharge to SNF once placement found.    Antimicrobials:  Anti-infectives (From admission, onward)   Start     Dose/Rate Route Frequency Ordered Stop   12/14/18 0100  cefTRIAXone (ROCEPHIN) 1 g in sodium chloride 0.9 % 100 mL IVPB  Status:  Discontinued     1 g 200 mL/hr over 30 Minutes Intravenous Every 24 hours 12/14/18 0010 12/17/18 0948       Objective: Vitals:   04/02/19 1708 04/02/19 1900 04/02/19 2036 04/03/19 0756  BP: (!) 99/56 100/84 105/77 95/61  Pulse: 81 90 87 68  Resp: 16 18 16 19   Temp: 98.4 F (36.9 C) 98.3 F (36.8 C) 97.6 F (36.4 C) 98 F (36.7 C)  TempSrc: Oral Oral    SpO2: (!) 89% 97% 98% 98%  Weight:      Height:        Intake/Output Summary (Last 24 hours) at 04/03/2019 0950 Last data filed at 04/02/2019 2330 Gross per 24 hour  Intake 1078 ml  Output --  Net 1078 ml   Filed Weights   12/15/18 0101 02/22/19 0359  Weight: 98.4 kg 67.1 kg    Examination: General exam: Appears calm and comfortable   Data Reviewed: I have personally reviewed following labs and imaging studies  CBC: Recent Labs  Lab 03/28/19 0150 04/03/19 0252  WBC 6.1 6.7  NEUTROABS 2.9  --   HGB 12.7 12.6  HCT 38.8 38.5  MCV 87.8 87.7  PLT 272 279   Basic Metabolic Panel: Recent Labs  Lab 03/28/19 0150 04/03/19 0252  NA 146* 145  K 3.5 3.8  CL 110 104  CO2 26 29  GLUCOSE 101* 96  BUN 8 14  CREATININE 0.66 0.74  CALCIUM 9.7 9.9  MG 2.0  --    GFR: Estimated Creatinine Clearance: 71.8 mL/min (by C-G formula based on SCr of 0.74 mg/dL). Liver Function Tests: Recent Labs  Lab 03/28/19 0150  AST 17  ALT 15  ALKPHOS 37*  BILITOT 0.8  PROT 6.7  ALBUMIN 3.4*   No results for input(s): LIPASE, AMYLASE in the last 168 hours. No results for input(s): AMMONIA in the last 168 hours. Coagulation Profile: No results for input(s): INR, PROTIME in the last 168 hours. Cardiac Enzymes: No results for input(s): CKTOTAL, CKMB,  CKMBINDEX, TROPONINI in the last 168 hours. BNP (last 3 results) No results for input(s): PROBNP in the last 8760 hours. HbA1C: No results for input(s): HGBA1C in the last 72 hours. CBG: No results for input(s): GLUCAP in the last 168 hours. Lipid Profile: No results for input(s): CHOL, HDL, LDLCALC, TRIG, CHOLHDL, LDLDIRECT in the last 72 hours. Thyroid Function Tests: No results for input(s): TSH, T4TOTAL, FREET4, T3FREE, THYROIDAB in the last 72 hours. Anemia Panel: No results for input(s): VITAMINB12, FOLATE, FERRITIN, TIBC, IRON, RETICCTPCT in the last 72 hours. Sepsis Labs: No results for input(s): PROCALCITON, LATICACIDVEN in the last 168 hours.  No results found for this or any previous visit (from the past 240 hour(s)).    Radiology Studies: No results found.    Scheduled Meds: . amLODipine  5 mg Oral Daily  . enoxaparin (LOVENOX) injection  40 mg Subcutaneous Q24H  . feeding supplement (ENSURE ENLIVE)  237 mL Oral BID BM  . folic acid  1 mg Oral Daily  . ketoconazole   Topical BID  . multivitamin with minerals  1 tablet Oral Daily  . nicotine  14 mg Transdermal Daily  . polyethylene glycol  17 g Oral BID  . senna-docusate  1 tablet Oral BID  . thiamine  100 mg Oral Daily   Continuous Infusions:   LOS: 111 days      Time spent: 72minutes   Dessa Phi, DO [facility] 04/03/2019, 9:50 AM   Available via Epic secure chat 7am-7pm After these hours, please refer to coverage provider listed on [email or web address]

## 2019-04-03 NOTE — TOC Progression Note (Signed)
Transition of Care [facility]) - Progression Note    Patient Details  Name: Marie Woods MRN: [id number] Date of Birth: 04-Aug-1955  [facility] CM/SW Contact  Mearl Latin, LCSW Phone Number: 04/03/2019, 2:49 PM  Clinical Narrative:    [facility] Director provided CSW with contact information for [facility]. No answer at facility and no voicemail x3 attempts.   Expected Discharge Plan: Skilled Nursing Facility Barriers to Discharge: No SNF bed, SNF Pending payor source - LOG  Expected Discharge Plan and Services Expected Discharge Plan: Skilled Nursing Facility In-house Referral: Clinical Social Work Discharge Planning Services: NA Post Acute Care Choice: Skilled Nursing Facility Living arrangements for the past 2 months: Apartment                 DME Arranged: N/A DME Agency: NA                   Social Determinants of Health (SDOH) Interventions    Readmission Risk Interventions No flowsheet data found.

## 2019-04-04 NOTE — Progress Notes (Signed)
PROGRESS NOTE    THAIS SILBERSTEIN  RWE:[id number] DOB: 10-11-1955 DOA: 12/13/2018 PCP: Ladell Pier, MD     Brief Narrative:  Marie Woods is a 64 year old female with history of asthma, hypertension, alcohol abuse who was brought to the hospital for confusion and altered mental status in November 2020. Found to have severe sepsis due to UTI which was treated. Confusion continued and MRI was suggestive Wernicke's encephalopathy which has been treated with thiamine supplementation. Currently awaiting SNF placement.   New events last 24 hours / Subjective: No complaints today, itchiness from yesterday now resolved   Assessment & Plan:   Principal Problem:   Wernicke encephalopathy Active Problems:   History of alcohol use   TOBACCO ABUSE   Essential hypertension   AMS (altered mental status)   Confusion   Acute metabolic encephalopathy   Agitation   Acute lower UTI   Sepsis ([place])   Sepsis secondary to UTI (HCC)   Normocytic anemia   Acute metabolic encephalopathy secondary to suspected Wernicke's encephalopathy -CT scan showing atrophy and MRI revealing symmetric diffusion weighted and T2/FLAIR hyperintense signal abnormality within the medial thalami. -S/p IV thiamine, continuing oral thiamine, folic acid, multivitamin -Patient will need outpatient follow-up with neurology and consideration of neuropsychiatry evaluation.  -Continue Haldol and Ativan as needed for extreme agitation; hydroxyzine for anxiety  Severe sepsis secondary to UTI -Resolved. Completed antibiotic tx   Essential hypertension -Continue norvasc  Alcohol abuse -Continue thiamine, folate, multivitamin  Tobacco abuse -Nicotine patch   DVT prophylaxis: Lovenox  Code Status: Full Family Communication: None at bedside Disposition Plan:  . Patient is from home prior to admission. . Other barrier(s) to discharge include SNF pending payor source, no SNF bed available. Medically stable for  discharge from hospital. Awaiting placement.  . Suspect patient will discharge to SNF once placement found.    Antimicrobials:  Anti-infectives (From admission, onward)   Start     Dose/Rate Route Frequency Ordered Stop   12/14/18 0100  cefTRIAXone (ROCEPHIN) 1 g in sodium chloride 0.9 % 100 mL IVPB  Status:  Discontinued     1 g 200 mL/hr over 30 Minutes Intravenous Every 24 hours 12/14/18 0010 12/17/18 0948       Objective: Vitals:   04/03/19 1620 04/03/19 2113 04/04/19 0054 04/04/19 0840  BP: 122/79 98/67 97/66  (!) 96/57  Pulse: 88 80 77 65  Resp: 19   18  Temp: 98.1 F (36.7 C) 97.6 F (36.4 C) (!) 97.5 F (36.4 C) 98.2 F (36.8 C)  TempSrc:  Oral Oral Oral  SpO2: 99% 99% 98% 92%  Weight:      Height:       No intake or output data in the 24 hours ending 04/04/19 0954 Filed Weights   12/15/18 0101 02/22/19 0359  Weight: 98.4 kg 67.1 kg    Examination: General exam: Appears calm and comfortable    Data Reviewed: I have personally reviewed following labs and imaging studies  CBC: Recent Labs  Lab 04/03/19 0252  WBC 6.7  HGB 12.6  HCT 38.5  MCV 87.7  PLT 619   Basic Metabolic Panel: Recent Labs  Lab 04/03/19 0252  NA 145  K 3.8  CL 104  CO2 29  GLUCOSE 96  BUN 14  CREATININE 0.74  CALCIUM 9.9   GFR: Estimated Creatinine Clearance: 71.8 mL/min (by C-G formula based on SCr of 0.74 mg/dL). Liver Function Tests: No results for input(s): AST, ALT, ALKPHOS, BILITOT, PROT, ALBUMIN in  the last 168 hours. No results for input(s): LIPASE, AMYLASE in the last 168 hours. No results for input(s): AMMONIA in the last 168 hours. Coagulation Profile: No results for input(s): INR, PROTIME in the last 168 hours. Cardiac Enzymes: No results for input(s): CKTOTAL, CKMB, CKMBINDEX, TROPONINI in the last 168 hours. BNP (last 3 results) No results for input(s): PROBNP in the last 8760 hours. HbA1C: No results for input(s): HGBA1C in the last 72 hours. CBG: No  results for input(s): GLUCAP in the last 168 hours. Lipid Profile: No results for input(s): CHOL, HDL, LDLCALC, TRIG, CHOLHDL, LDLDIRECT in the last 72 hours. Thyroid Function Tests: No results for input(s): TSH, T4TOTAL, FREET4, T3FREE, THYROIDAB in the last 72 hours. Anemia Panel: No results for input(s): VITAMINB12, FOLATE, FERRITIN, TIBC, IRON, RETICCTPCT in the last 72 hours. Sepsis Labs: No results for input(s): PROCALCITON, LATICACIDVEN in the last 168 hours.  No results found for this or any previous visit (from the past 240 hour(s)).    Radiology Studies: No results found.    Scheduled Meds: . amLODipine  5 mg Oral Daily  . enoxaparin (LOVENOX) injection  40 mg Subcutaneous Q24H  . feeding supplement (ENSURE ENLIVE)  237 mL Oral BID BM  . folic acid  1 mg Oral Daily  . ketoconazole   Topical BID  . multivitamin with minerals  1 tablet Oral Daily  . nicotine  14 mg Transdermal Daily  . polyethylene glycol  17 g Oral BID  . senna-docusate  1 tablet Oral BID  . thiamine  100 mg Oral Daily   Continuous Infusions:   LOS: 112 days      Time spent: 10 minutes   Noralee Stain, DO [facility] 04/04/2019, 9:54 AM   Available via Epic secure chat 7am-7pm After these hours, please refer to coverage provider listed on [email or web address]

## 2019-04-05 NOTE — Progress Notes (Signed)
PROGRESS NOTE    Marie Woods  WUJ:[id number] DOB: [birth year] DOA: 12/13/2018 PCP: Marcine Matar, MD     Brief Narrative:  Marie Woods is a 64 year old female with history of asthma, hypertension, alcohol abuse who was brought to the hospital for confusion and altered mental status in November 2020. Found to have severe sepsis due to UTI which was treated. Confusion continued and MRI was suggestive Wernicke's encephalopathy which has been treated with thiamine supplementation. Currently awaiting SNF placement.   New events last 24 hours / Subjective: No new issues overnight. No complaints on exam   Assessment & Plan:   Principal Problem:   Wernicke encephalopathy Active Problems:   History of alcohol use   TOBACCO ABUSE   Essential hypertension   AMS (altered mental status)   Confusion   Acute metabolic encephalopathy   Agitation   Acute lower UTI   Sepsis (HCC)   Sepsis secondary to UTI (HCC)   Normocytic anemia   Acute metabolic encephalopathy secondary to suspected Wernicke's encephalopathy -CT scan showing atrophy and MRI revealing symmetric diffusion weighted and T2/FLAIR hyperintense signal abnormality within the medial thalami. -S/p IV thiamine, continuing oral thiamine, folic acid, multivitamin -Patient will need outpatient follow-up with neurology and consideration of neuropsychiatry evaluation.  -Continue Haldol and Ativan as needed for extreme agitation; hydroxyzine for anxiety  Severe sepsis secondary to UTI -Resolved. Completed antibiotic tx   Essential hypertension -Stop norvasc, BP on the low side now   Alcohol abuse -Continue thiamine, folate, multivitamin  Tobacco abuse -Nicotine patch   DVT prophylaxis: Lovenox  Code Status: Full Family Communication: None at bedside Disposition Plan:  . Patient is from home prior to admission. . Other barrier(s) to discharge include SNF pending payor source, no SNF bed available. Medically  stable for discharge from hospital. Awaiting placement.  . Suspect patient will discharge to SNF once placement found.    Antimicrobials:  Anti-infectives (From admission, onward)   Start     Dose/Rate Route Frequency Ordered Stop   12/14/18 0100  cefTRIAXone (ROCEPHIN) 1 g in sodium chloride 0.9 % 100 mL IVPB  Status:  Discontinued     1 g 200 mL/hr over 30 Minutes Intravenous Every 24 hours 12/14/18 0010 12/17/18 0948       Objective: Vitals:   04/04/19 0840 04/04/19 1601 04/04/19 2134 04/05/19 0814  BP: (!) 96/57 94/79 91/65  121/77  Pulse: 65 79 73 66  Resp: 18 16  19   Temp: 98.2 F (36.8 C) 98.1 F (36.7 C) 98 F (36.7 C) 97.6 F (36.4 C)  TempSrc: Oral Oral Oral   SpO2: 92% 100% 98% 99%  Weight:      Height:       No intake or output data in the 24 hours ending 04/05/19 0932 Filed Weights   12/15/18 0101 02/22/19 0359  Weight: 98.4 kg 67.1 kg    Examination: General exam: Appears calm and comfortable     Data Reviewed: I have personally reviewed following labs and imaging studies  CBC: Recent Labs  Lab 04/03/19 0252  WBC 6.7  HGB 12.6  HCT 38.5  MCV 87.7  PLT 279   Basic Metabolic Panel: Recent Labs  Lab 04/03/19 0252  NA 145  K 3.8  CL 104  CO2 29  GLUCOSE 96  BUN 14  CREATININE 0.74  CALCIUM 9.9   GFR: Estimated Creatinine Clearance: 71.8 mL/min (by C-G formula based on SCr of 0.74 mg/dL). Liver Function Tests: No results for input(s):  AST, ALT, ALKPHOS, BILITOT, PROT, ALBUMIN in the last 168 hours. No results for input(s): LIPASE, AMYLASE in the last 168 hours. No results for input(s): AMMONIA in the last 168 hours. Coagulation Profile: No results for input(s): INR, PROTIME in the last 168 hours. Cardiac Enzymes: No results for input(s): CKTOTAL, CKMB, CKMBINDEX, TROPONINI in the last 168 hours. BNP (last 3 results) No results for input(s): PROBNP in the last 8760 hours. HbA1C: No results for input(s): HGBA1C in the last 72 hours.  CBG: No results for input(s): GLUCAP in the last 168 hours. Lipid Profile: No results for input(s): CHOL, HDL, LDLCALC, TRIG, CHOLHDL, LDLDIRECT in the last 72 hours. Thyroid Function Tests: No results for input(s): TSH, T4TOTAL, FREET4, T3FREE, THYROIDAB in the last 72 hours. Anemia Panel: No results for input(s): VITAMINB12, FOLATE, FERRITIN, TIBC, IRON, RETICCTPCT in the last 72 hours. Sepsis Labs: No results for input(s): PROCALCITON, LATICACIDVEN in the last 168 hours.  No results found for this or any previous visit (from the past 240 hour(s)).    Radiology Studies: No results found.    Scheduled Meds: . enoxaparin (LOVENOX) injection  40 mg Subcutaneous Q24H  . feeding supplement (ENSURE ENLIVE)  237 mL Oral BID BM  . folic acid  1 mg Oral Daily  . ketoconazole   Topical BID  . multivitamin with minerals  1 tablet Oral Daily  . nicotine  14 mg Transdermal Daily  . polyethylene glycol  17 g Oral BID  . senna-docusate  1 tablet Oral BID  . thiamine  100 mg Oral Daily   Continuous Infusions:   LOS: 113 days      Time spent: 10 minutes   Dessa Phi, DO [facility] 04/05/2019, 9:32 AM   Available via Epic secure chat 7am-7pm After these hours, please refer to coverage provider listed on [email or web address]

## 2019-04-05 NOTE — TOC Progression Note (Signed)
Transition of Care [facility]) - Progression Note    Patient Details  Name: Marie Woods MRN: [id number] Date of Birth: 1955/04/09  [facility]) CM/SW Contact  Mearl Latin, LCSW Phone Number: 04/05/2019, 10:00 AM  Clinical Narrative:    Patient continues to have no bed offers. CSW will discuss case again at [facility] meeting.    Expected Discharge Plan: Skilled Nursing Facility Barriers to Discharge: No SNF bed, SNF Pending payor source - LOG  Expected Discharge Plan and Services Expected Discharge Plan: Skilled Nursing Facility In-house Referral: Clinical Social Work Discharge Planning Services: NA Post Acute Care Choice: Skilled Nursing Facility Living arrangements for the past 2 months: Apartment                 DME Arranged: N/A DME Agency: NA                   Social Determinants of Health (SDOH) Interventions    Readmission Risk Interventions No flowsheet data found.

## 2019-04-06 NOTE — Progress Notes (Signed)
PROGRESS NOTE    Marie Woods  NTI:[id number] DOB: Feb 03, 1955 DOA: 12/13/2018 PCP: Ladell Pier, MD   Brief Narrative:  64 year old female with history of asthma, hypertension, alcohol abuse was brought to the hospital for confusion and altered mental status. Found to have UTI. MRI suggestive of possible Warnicke's encephalopathy, patient was treated with thiamine. PT and OT have recommended SNF. Patient has been difficult to place.  Assessment & Plan   Severe sepsis secondary to UTI -Present on admission -Urine culture showed multiple species, repeat culture showed less than 10,000 colonies -Was initially treated with IV fluids and Rocephin -Sepsis resolved -Chest x-ray was negative for infection -Currently hemodynamically stable  Acute metabolic encephalopathy/possible Wernicke's encephalopathy -CT head showed mild atrophy neck changes without acute intercranial normality -MRI brain showed symmetric diffusion weighted and T2/FLAIR hyperintense signal abnormality within the medial thalami, favoring Wernicke's encephalopathy -Alcohol level was less than 10 on admission -Urine drug screen was negative -Patient completed IV thiamine and is currently on oral thiamine -Continue folic acid, multivitamin -Patient will need outpatient follow-up with neurology and consider neuropsychiatry evaluation -? Underlying dementia with memory issues as mental status has been fluctuating. Although today patient does appear to be alert and oriented x3 -Of note, patient is on Haldol and Ativan as needed for extreme agitation; hydroxyzine for anxiety  Essential hypertension -Norvasc discontinued due to soft BP -BP currently 95/61  Alcohol abuse -Treated with CIWA protocol -No alcohol withdrawal noted -Continue thiamine, folate, multivitamin  Tobacco abuse -Nicotine patch  Generalized weakness -PT/OT recommending SNF -Patient needs supervision given her short-term memory impairment.   Normocytic anemia -Stable, no signs of bleeding -Continue to monitor CBC  Chest tightness/shortness of breath and dizziness -Resolved, likely transient  -Was not orthostatic   Constipation -Continue bowel regimen  DVT Prophylaxis Lovenox  Code Status: Full  Family Communication: None at bedside  Disposition Plan: Admitted. Patient presented from home with acute metabolic encephalopathy and sepsis secondary to UTI. Mental status waxes and wanes therefore patient continues to need SNF placement however difficult to place.  Consultants Neurology Psychiatry  Procedures  None  Antibiotics   Anti-infectives (From admission, onward)   Start     Dose/Rate Route Frequency Ordered Stop   12/14/18 0100  cefTRIAXone (ROCEPHIN) 1 g in sodium chloride 0.9 % 100 mL IVPB  Status:  Discontinued     1 g 200 mL/hr over 30 Minutes Intravenous Every 24 hours 12/14/18 0010 12/17/18 8676      Subjective:   Marie Woods seen and examined today.  No complaints today. Denies current chest pain, shortness of breath, abdominal pain, N/V/D/C, dizziness, headache.    Objective:   Vitals:   04/05/19 0814 04/05/19 1641 04/05/19 2127 04/06/19 0829  BP: 121/77 96/69 111/72 95/61  Pulse: 66 77 68 65  Resp: 19 19 17 18   Temp: 97.6 F (36.4 C) 97.6 F (36.4 C) 98 F (36.7 C) 98.2 F (36.8 C)  TempSrc: Oral  Oral Oral  SpO2: 99% 100% 97% 94%  Weight:      Height:        Intake/Output Summary (Last 24 hours) at 04/06/2019 1950 Last data filed at 04/05/2019 1000 Gross per 24 hour  Intake 100 ml  Output -  Net 100 ml   Filed Weights   12/15/18 0101 02/22/19 0359  Weight: 98.4 kg 67.1 kg   Exam  General: Well developed, well nourished, NAD, appears stated age  HEENT: NCAT,  mucous membranes moist.   Cardiovascular:  S1 S2 auscultated, RRR, no murmur  Respiratory: Clear to auscultation bilaterally   Abdomen: Soft, nontender, nondistended, + bowel sounds  Extremities: warm dry without  cyanosis clubbing or edema  Neuro: AAOx3, nonfocal   Psych: Appropriate mood and affect, pleasant   Data Reviewed: I have personally reviewed following labs and imaging studies  CBC: Recent Labs  Lab 04/03/19 0252  WBC 6.7  HGB 12.6  HCT 38.5  MCV 87.7  PLT 279   Basic Metabolic Panel: Recent Labs  Lab 04/03/19 0252  NA 145  K 3.8  CL 104  CO2 29  GLUCOSE 96  BUN 14  CREATININE 0.74  CALCIUM 9.9   GFR: Estimated Creatinine Clearance: 71.8 mL/min (by C-G formula based on SCr of 0.74 mg/dL). Liver Function Tests: No results for input(s): AST, ALT, ALKPHOS, BILITOT, PROT, ALBUMIN in the last 168 hours. No results for input(s): LIPASE, AMYLASE in the last 168 hours. No results for input(s): AMMONIA in the last 168 hours. Coagulation Profile: No results for input(s): INR, PROTIME in the last 168 hours. Cardiac Enzymes: No results for input(s): CKTOTAL, CKMB, CKMBINDEX, TROPONINI in the last 168 hours. BNP (last 3 results) No results for input(s): PROBNP in the last 8760 hours. HbA1C: No results for input(s): HGBA1C in the last 72 hours. CBG: No results for input(s): GLUCAP in the last 168 hours. Lipid Profile: No results for input(s): CHOL, HDL, LDLCALC, TRIG, CHOLHDL, LDLDIRECT in the last 72 hours. Thyroid Function Tests: No results for input(s): TSH, T4TOTAL, FREET4, T3FREE, THYROIDAB in the last 72 hours. Anemia Panel: No results for input(s): VITAMINB12, FOLATE, FERRITIN, TIBC, IRON, RETICCTPCT in the last 72 hours. Urine analysis:    Component Value Date/Time   COLORURINE AMBER (A) 12/13/2018 2218   APPEARANCEUR HAZY (A) 12/13/2018 2218   LABSPEC 1.027 12/13/2018 2218   PHURINE 5.0 12/13/2018 2218   GLUCOSEU NEGATIVE 12/13/2018 2218   HGBUR SMALL (A) 12/13/2018 2218   HGBUR small 06/28/2009 1504   BILIRUBINUR SMALL (A) 12/13/2018 2218   KETONESUR 5 (A) 12/13/2018 2218   PROTEINUR 30 (A) 12/13/2018 2218   UROBILINOGEN 1.0 09/28/2014 1426   NITRITE  NEGATIVE 12/13/2018 2218   LEUKOCYTESUR TRACE (A) 12/13/2018 2218   Sepsis Labs: @LABRCNTIP (procalcitonin:4,lacticidven:4)  ) No results found for this or any previous visit (from the past 240 hour(s)).    Radiology Studies: No results found.   Scheduled Meds: . enoxaparin (LOVENOX) injection  40 mg Subcutaneous Q24H  . feeding supplement (ENSURE ENLIVE)  237 mL Oral BID BM  . folic acid  1 mg Oral Daily  . ketoconazole   Topical BID  . multivitamin with minerals  1 tablet Oral Daily  . nicotine  14 mg Transdermal Daily  . polyethylene glycol  17 g Oral BID  . senna-docusate  1 tablet Oral BID  . thiamine  100 mg Oral Daily   Continuous Infusions:   LOS: 114 days   Time Spent in minutes   20 minutes  Yaden Seith D.O. on 04/06/2019 at 8:32 AM  Between 7am to 7pm - Please see pager noted on [email or web address]  After 7pm go to [email or web address]  And look for the night coverage person covering for me after hours  [facility] Office  [phone]

## 2019-04-07 MED ORDER — WHITE PETROLATUM EX OINT
TOPICAL_OINTMENT | CUTANEOUS | Status: DC | PRN
  Administered 2019-04-08 – 2019-04-26 (×4): 0.2 via TOPICAL
  Filled 2019-04-07 (×2): qty 28.35
  Filled 2019-04-07: qty 85.05
  Filled 2019-04-07: qty 28.35

## 2019-04-07 NOTE — Progress Notes (Signed)
PROGRESS NOTE    Marie Woods  QMV:[id number] DOB: 01/10/1956 DOA: 12/13/2018 PCP: Ladell Pier, MD   Brief Narrative:  64 year old female with history of asthma, hypertension, alcohol abuse was brought to the hospital for confusion and altered mental status. Found to have UTI. MRI suggestive of possible Warnicke's encephalopathy, patient was treated with thiamine. PT and OT have recommended SNF. Patient has been difficult to place.  Assessment & Plan   Severe sepsis secondary to UTI -Present on admission -Urine culture showed multiple species, repeat culture showed less than 10,000 colonies -Was initially treated with IV fluids and Rocephin -Sepsis resolved -Chest x-ray was negative for infection -Currently hemodynamically stable  Acute metabolic encephalopathy/possible Wernicke's encephalopathy -CT head showed mild atrophy neck changes without acute intercranial normality -MRI brain showed symmetric diffusion weighted and T2/FLAIR hyperintense signal abnormality within the medial thalami, favoring Wernicke's encephalopathy -Alcohol level was less than 10 on admission -Urine drug screen was negative -Patient completed IV thiamine and is currently on oral thiamine -Continue folic acid, multivitamin -Patient will need outpatient follow-up with neurology and consider neuropsychiatry evaluation -? Underlying dementia with memory issues as mental status has been fluctuating. Although today patient does appear to be alert and oriented x3 -Of note, patient is on Haldol and Ativan as needed for extreme agitation; hydroxyzine for anxiety  Essential hypertension -Norvasc discontinued due to soft BP -BP currently 95/61  Alcohol abuse -Treated with CIWA protocol -No alcohol withdrawal noted -Continue thiamine, folate, multivitamin  Tobacco abuse -Nicotine patch  Generalized weakness -PT/OT recommending SNF -Patient needs supervision given her short-term memory impairment.    Normocytic anemia -Stable, no signs of bleeding -Continue to monitor CBC  Chest tightness/shortness of breath and dizziness -Resolved, likely transient  -Was not orthostatic   Constipation -Continue bowel regimen  DVT Prophylaxis Lovenox  Code Status: Full  Family Communication: None at bedside  Disposition Plan: Admitted. Patient presented from home with acute metabolic encephalopathy and sepsis secondary to UTI. Mental status waxes and wanes therefore she is unsafe to be discharged to home alone- continues to need SNF placement however difficult to place.  Consultants Neurology Psychiatry  Procedures  None  Antibiotics   Anti-infectives (From admission, onward)   Start     Dose/Rate Route Frequency Ordered Stop   12/14/18 0100  cefTRIAXone (ROCEPHIN) 1 g in sodium chloride 0.9 % 100 mL IVPB  Status:  Discontinued     1 g 200 mL/hr over 30 Minutes Intravenous Every 24 hours 12/14/18 0010 12/17/18 2841      Subjective:   [name] seen and examined today.  No complaints today. Denies current chest pain, shortness of breath, abdominal pain, N/V/D/C, dizziness, headache.    Objective:   Vitals:   04/06/19 0829 04/06/19 1657 04/06/19 2203 04/07/19 0841  BP: 95/61 (!) 165/71 (!) 170/65 97/67  Pulse: 65 80 69 71  Resp: 18 18 17 16   Temp: 98.2 F (36.8 C) 97.7 F (36.5 C) 97.9 F (36.6 C) 98.3 F (36.8 C)  TempSrc: Oral Oral Oral Oral  SpO2: 94% 100% 98% 97%  Weight:      Height:        Intake/Output Summary (Last 24 hours) at 04/07/2019 0858 Last data filed at 04/07/2019 0100 Gross per 24 hour  Intake 838 ml  Output --  Net 838 ml   Filed Weights   12/15/18 0101 02/22/19 0359  Weight: 98.4 kg 67.1 kg   Exam  General: Well developed, well nourished, NAD, appears stated  age  HEENT: NCAT,  mucous membranes moist.   Cardiovascular: S1 S2 auscultated, RRR, no murmur  Respiratory: Clear to auscultation bilaterally   Abdomen: Soft, nontender,  nondistended, + bowel sounds  Extremities: warm dry without cyanosis clubbing or edema  Neuro: AAOx3, nonfocal   Psych: Appropriate mood and affect, pleasant   Data Reviewed: I have personally reviewed following labs and imaging studies  CBC: Recent Labs  Lab 04/03/19 0252  WBC 6.7  HGB 12.6  HCT 38.5  MCV 87.7  PLT 279   Basic Metabolic Panel: Recent Labs  Lab 04/03/19 0252  NA 145  K 3.8  CL 104  CO2 29  GLUCOSE 96  BUN 14  CREATININE 0.74  CALCIUM 9.9   GFR: Estimated Creatinine Clearance: 71.8 mL/min (by C-G formula based on SCr of 0.74 mg/dL). Liver Function Tests: No results for input(s): AST, ALT, ALKPHOS, BILITOT, PROT, ALBUMIN in the last 168 hours. No results for input(s): LIPASE, AMYLASE in the last 168 hours. No results for input(s): AMMONIA in the last 168 hours. Coagulation Profile: No results for input(s): INR, PROTIME in the last 168 hours. Cardiac Enzymes: No results for input(s): CKTOTAL, CKMB, CKMBINDEX, TROPONINI in the last 168 hours. BNP (last 3 results) No results for input(s): PROBNP in the last 8760 hours. HbA1C: No results for input(s): HGBA1C in the last 72 hours. CBG: No results for input(s): GLUCAP in the last 168 hours. Lipid Profile: No results for input(s): CHOL, HDL, LDLCALC, TRIG, CHOLHDL, LDLDIRECT in the last 72 hours. Thyroid Function Tests: No results for input(s): TSH, T4TOTAL, FREET4, T3FREE, THYROIDAB in the last 72 hours. Anemia Panel: No results for input(s): VITAMINB12, FOLATE, FERRITIN, TIBC, IRON, RETICCTPCT in the last 72 hours. Urine analysis:    Component Value Date/Time   COLORURINE AMBER (A) 12/13/2018 2218   APPEARANCEUR HAZY (A) 12/13/2018 2218   LABSPEC 1.027 12/13/2018 2218   PHURINE 5.0 12/13/2018 2218   GLUCOSEU NEGATIVE 12/13/2018 2218   HGBUR SMALL (A) 12/13/2018 2218   HGBUR small 06/28/2009 1504   BILIRUBINUR SMALL (A) 12/13/2018 2218   KETONESUR 5 (A) 12/13/2018 2218   PROTEINUR 30 (A)  12/13/2018 2218   UROBILINOGEN 1.0 09/28/2014 1426   NITRITE NEGATIVE 12/13/2018 2218   LEUKOCYTESUR TRACE (A) 12/13/2018 2218   Sepsis Labs: @LABRCNTIP (procalcitonin:4,lacticidven:4)  ) No results found for this or any previous visit (from the past 240 hour(s)).    Radiology Studies: No results found.   Scheduled Meds: . enoxaparin (LOVENOX) injection  40 mg Subcutaneous Q24H  . feeding supplement (ENSURE ENLIVE)  237 mL Oral BID BM  . folic acid  1 mg Oral Daily  . ketoconazole   Topical BID  . multivitamin with minerals  1 tablet Oral Daily  . nicotine  14 mg Transdermal Daily  . polyethylene glycol  17 g Oral BID  . senna-docusate  1 tablet Oral BID  . thiamine  100 mg Oral Daily   Continuous Infusions:   LOS: 115 days   Time Spent in minutes   20 minutes  Rayana Geurin D.O. on 04/07/2019 at 8:58 AM  Between 7am to 7pm - Please see pager noted on [email or web address]  After 7pm go to [email or web address]  And look for the night coverage person covering for me after hours  [facility] Office  [phone]

## 2019-04-07 NOTE — TOC Progression Note (Signed)
Transition of Care [facility]) - Progression Note    Patient Details  Name: Marie Woods MRN: [id number] Date of Birth: 30-May-1955  Transition of Care [facility]) CM/SW Contact  Mearl Latin, LCSW Phone Number: 04/07/2019, 3:41 PM  Clinical Narrative:    CSW sent referral to [facility] along with 10 other area group homes.    Expected Discharge Plan: Skilled Nursing Facility Barriers to Discharge: No SNF bed, SNF Pending payor source - LOG  Expected Discharge Plan and Services Expected Discharge Plan: Skilled Nursing Facility In-house Referral: Clinical Social Work Discharge Planning Services: NA Post Acute Care Choice: Skilled Nursing Facility Living arrangements for the past 2 months: Apartment                 DME Arranged: N/A DME Agency: NA                   Social Determinants of Health (SDOH) Interventions    Readmission Risk Interventions No flowsheet data found.

## 2019-04-08 NOTE — Progress Notes (Signed)
PROGRESS NOTE    Marie Woods  YHC:[id number] DOB: [birth year] DOA: 12/13/2018 PCP: Marcine Matar, MD   Brief Narrative:  64 year old female with history of asthma, hypertension, alcohol abuse was brought to the hospital for confusion and altered mental status. Found to have UTI. MRI suggestive of possible Warnicke's encephalopathy, patient was treated with thiamine. PT and OT have recommended SNF. Patient has been difficult to place.  Assessment & Plan   Severe sepsis secondary to UTI -Present on admission -Urine culture showed multiple species, repeat culture showed less than 10,000 colonies -Was initially treated with IV fluids and Rocephin -Sepsis resolved -Chest x-ray was negative for infection -Currently hemodynamically stable  Acute metabolic encephalopathy/possible Wernicke's encephalopathy -CT head showed mild atrophy neck changes without acute intercranial normality -MRI brain showed symmetric diffusion weighted and T2/FLAIR hyperintense signal abnormality within the medial thalami, favoring Wernicke's encephalopathy -Alcohol level was less than 10 on admission -Urine drug screen was negative -Patient completed IV thiamine and is currently on oral thiamine -Continue folic acid, multivitamin -Patient will need outpatient follow-up with neurology and consider neuropsychiatry evaluation -? Underlying dementia with memory issues as mental status has been fluctuating. Although today patient does appear to be alert and oriented x3 -Of note, patient is on Haldol and Ativan as needed for extreme agitation; hydroxyzine for anxiety  Essential hypertension -Norvasc discontinued due to soft BP -BP currently 92/60  Alcohol abuse -Treated with CIWA protocol -No alcohol withdrawal noted -Continue thiamine, folate, multivitamin  Tobacco abuse -Nicotine patch  Generalized weakness -PT/OT recommending SNF -Patient needs supervision given her short-term memory impairment.    Normocytic anemia -Stable, no signs of bleeding -Continue to monitor CBC  Chest tightness/shortness of breath and dizziness -Resolved, likely transient  -Was not orthostatic   Constipation -Continue bowel regimen  DVT Prophylaxis Lovenox  Code Status: Full  Family Communication: None at bedside  Disposition Plan: Admitted. Patient presented from home with acute metabolic encephalopathy and sepsis secondary to UTI. Mental status waxes and wanes therefore she is unsafe to be discharged to home alone- continues to need SNF placement however difficult to place.  Consultants Neurology Psychiatry  Procedures  None  Antibiotics   Anti-infectives (From admission, onward)   Start     Dose/Rate Route Frequency Ordered Stop   12/14/18 0100  cefTRIAXone (ROCEPHIN) 1 g in sodium chloride 0.9 % 100 mL IVPB  Status:  Discontinued     1 g 200 mL/hr over 30 Minutes Intravenous Every 24 hours 12/14/18 0010 12/17/18 5176      Subjective:   Marie Woods seen and examined today.  No complaints today. Denies current chest pain, shortness of breath, abdominal pain, N/V/D/C, dizziness, headache.    Objective:   Vitals:   04/07/19 0841 04/07/19 1727 04/07/19 2154 04/08/19 0816  BP: 97/67 102/74 103/74 92/60  Pulse: 71 75 83 64  Resp: 16 17 16 19   Temp: 98.3 F (36.8 C) 98.1 F (36.7 C) 98 F (36.7 C) 98.2 F (36.8 C)  TempSrc: Oral  Oral   SpO2: 97% 98% 95% 94%  Weight:      Height:       No intake or output data in the 24 hours ending 04/08/19 0933 Filed Weights   12/15/18 0101 02/22/19 0359  Weight: 98.4 kg 67.1 kg    Exam  General: Well developed, well nourished, NAD, appears stated age  HEENT: NCAT, mucous membranes moist.   Extremities: warm dry without cyanosis clubbing or edema  Neuro: Alert and  awake  Psych: Appropriate   Data Reviewed: I have personally reviewed following labs and imaging studies  CBC: Recent Labs  Lab 04/03/19 0252  WBC 6.7  HGB  12.6  HCT 38.5  MCV 87.7  PLT 161   Basic Metabolic Panel: Recent Labs  Lab 04/03/19 0252  NA 145  K 3.8  CL 104  CO2 29  GLUCOSE 96  BUN 14  CREATININE 0.74  CALCIUM 9.9   GFR: Estimated Creatinine Clearance: 71.8 mL/min (by C-G formula based on SCr of 0.74 mg/dL). Liver Function Tests: No results for input(s): AST, ALT, ALKPHOS, BILITOT, PROT, ALBUMIN in the last 168 hours. No results for input(s): LIPASE, AMYLASE in the last 168 hours. No results for input(s): AMMONIA in the last 168 hours. Coagulation Profile: No results for input(s): INR, PROTIME in the last 168 hours. Cardiac Enzymes: No results for input(s): CKTOTAL, CKMB, CKMBINDEX, TROPONINI in the last 168 hours. BNP (last 3 results) No results for input(s): PROBNP in the last 8760 hours. HbA1C: No results for input(s): HGBA1C in the last 72 hours. CBG: No results for input(s): GLUCAP in the last 168 hours. Lipid Profile: No results for input(s): CHOL, HDL, LDLCALC, TRIG, CHOLHDL, LDLDIRECT in the last 72 hours. Thyroid Function Tests: No results for input(s): TSH, T4TOTAL, FREET4, T3FREE, THYROIDAB in the last 72 hours. Anemia Panel: No results for input(s): VITAMINB12, FOLATE, FERRITIN, TIBC, IRON, RETICCTPCT in the last 72 hours. Urine analysis:    Component Value Date/Time   COLORURINE AMBER (A) 12/13/2018 2218   APPEARANCEUR HAZY (A) 12/13/2018 2218   LABSPEC 1.027 12/13/2018 2218   PHURINE 5.0 12/13/2018 2218   GLUCOSEU NEGATIVE 12/13/2018 2218   HGBUR SMALL (A) 12/13/2018 2218   HGBUR small 06/28/2009 1504   BILIRUBINUR SMALL (A) 12/13/2018 2218   KETONESUR 5 (A) 12/13/2018 2218   PROTEINUR 30 (A) 12/13/2018 2218   UROBILINOGEN 1.0 09/28/2014 1426   NITRITE NEGATIVE 12/13/2018 2218   LEUKOCYTESUR TRACE (A) 12/13/2018 2218   Sepsis Labs: @LABRCNTIP (procalcitonin:4,lacticidven:4)  ) No results found for this or any previous visit (from the past 240 hour(s)).    Radiology Studies: No  results found.   Scheduled Meds: . enoxaparin (LOVENOX) injection  40 mg Subcutaneous Q24H  . feeding supplement (ENSURE ENLIVE)  237 mL Oral BID BM  . folic acid  1 mg Oral Daily  . ketoconazole   Topical BID  . multivitamin with minerals  1 tablet Oral Daily  . nicotine  14 mg Transdermal Daily  . polyethylene glycol  17 g Oral BID  . senna-docusate  1 tablet Oral BID  . thiamine  100 mg Oral Daily   Continuous Infusions:   LOS: 116 days   Time Spent in minutes   20 minutes  Tanise Russman D.O. on 04/08/2019 at 9:33 AM  Between 7am to 7pm - Please see pager noted on [email or web address]  After 7pm go to [email or web address]  And look for the night coverage person covering for me after hours  [facility] Office  [phone]

## 2019-04-09 NOTE — Progress Notes (Signed)
PROGRESS NOTE    Marie Woods  WEX:[id number] DOB: 1955/09/09 DOA: 12/13/2018 PCP: Ladell Pier, MD   Brief Narrative:  64 year old female with history of asthma, hypertension, alcohol abuse was brought to the hospital for confusion and altered mental status. Found to have UTI. MRI suggestive of possible Warnicke's encephalopathy, patient was treated with thiamine. PT and OT have recommended SNF. Patient has been difficult to place.  Assessment & Plan   Severe sepsis secondary to UTI -Present on admission -Urine culture showed multiple species, repeat culture showed less than 10,000 colonies -Was initially treated with IV fluids and Rocephin -Sepsis resolved -Chest x-ray was negative for infection -Currently hemodynamically stable  Acute metabolic encephalopathy/possible Wernicke's encephalopathy -CT head showed mild atrophy neck changes without acute intercranial normality -MRI brain showed symmetric diffusion weighted and T2/FLAIR hyperintense signal abnormality within the medial thalami, favoring Wernicke's encephalopathy -Alcohol level was less than 10 on admission -Urine drug screen was negative -Patient completed IV thiamine and is currently on oral thiamine -Continue folic acid, multivitamin -Patient will need outpatient follow-up with neurology and consider neuropsychiatry evaluation -? Underlying dementia with memory issues as mental status has been fluctuating. Although today patient does appear to be alert and oriented x3 -Of note, patient is on Haldol and Ativan as needed for extreme agitation; hydroxyzine for anxiety  Essential hypertension -Norvasc discontinued due to soft BP -BP currently 92/60  Alcohol abuse -Treated with CIWA protocol -No alcohol withdrawal noted -Continue thiamine, folate, multivitamin  Tobacco abuse -Nicotine patch  Generalized weakness -PT/OT recommending SNF -Patient needs supervision given her short-term memory impairment.    Normocytic anemia -Stable, no signs of bleeding -Continue to monitor CBC  Chest tightness/shortness of breath and dizziness -Resolved, likely transient  -Was not orthostatic   Constipation -Continue bowel regimen  DVT Prophylaxis Lovenox  Code Status: Full  Family Communication: None at bedside  Disposition Plan: Admitted. Patient presented from home with acute metabolic encephalopathy and sepsis secondary to UTI. Mental status waxes and wanes therefore she is unsafe to be discharged to home alone- continues to need SNF placement however difficult to place.  Consultants Neurology Psychiatry  Procedures  None  Antibiotics   Anti-infectives (From admission, onward)   Start     Dose/Rate Route Frequency Ordered Stop   12/14/18 0100  cefTRIAXone (ROCEPHIN) 1 g in sodium chloride 0.9 % 100 mL IVPB  Status:  Discontinued     1 g 200 mL/hr over 30 Minutes Intravenous Every 24 hours 12/14/18 0010 12/17/18 9381      Subjective:   Marie Woods seen and examined today.  Patient with no complaints today.  Currently denies chest pain, shortness of breath, abdominal pain, nausea vomiting, diarrhea constipation, dizziness or headache. Objective:   Vitals:   04/08/19 0816 04/08/19 1730 04/08/19 2032 04/09/19 0811  BP: 92/60 (!) 123/96 117/76 104/74  Pulse: 64 99 68 80  Resp: 19 20 18 16   Temp: 98.2 F (36.8 C) 98.7 F (37.1 C) 98 F (36.7 C) 98.2 F (36.8 C)  TempSrc: Oral  Oral   SpO2: 94% (!) 89% 90% 100%  Weight:      Height:        Intake/Output Summary (Last 24 hours) at 04/09/2019 0901 Last data filed at 04/09/2019 0800 Gross per 24 hour  Intake 360 ml  Output --  Net 360 ml   Filed Weights   12/15/18 0101 02/22/19 0359  Weight: 98.4 kg 67.1 kg   Exam  General: Well developed, well  nourished, NAD, appears stated age  HEENT: NCAT,  mucous membranes moist.   Cardiovascular: S1 S2 auscultated, RRR, no murmur  Respiratory: Clear to auscultation bilaterally    Abdomen: Soft, nontender, nondistended, + bowel sounds  Extremities: warm dry without cyanosis clubbing or edema  Neuro: AAOx2, nonfocal  Psych: appropriate mood and affect  Data Reviewed: I have personally reviewed following labs and imaging studies  CBC: Recent Labs  Lab 04/03/19 0252  WBC 6.7  HGB 12.6  HCT 38.5  MCV 87.7  PLT 279   Basic Metabolic Panel: Recent Labs  Lab 04/03/19 0252  NA 145  K 3.8  CL 104  CO2 29  GLUCOSE 96  BUN 14  CREATININE 0.74  CALCIUM 9.9   GFR: Estimated Creatinine Clearance: 71.8 mL/min (by C-G formula based on SCr of 0.74 mg/dL). Liver Function Tests: No results for input(s): AST, ALT, ALKPHOS, BILITOT, PROT, ALBUMIN in the last 168 hours. No results for input(s): LIPASE, AMYLASE in the last 168 hours. No results for input(s): AMMONIA in the last 168 hours. Coagulation Profile: No results for input(s): INR, PROTIME in the last 168 hours. Cardiac Enzymes: No results for input(s): CKTOTAL, CKMB, CKMBINDEX, TROPONINI in the last 168 hours. BNP (last 3 results) No results for input(s): PROBNP in the last 8760 hours. HbA1C: No results for input(s): HGBA1C in the last 72 hours. CBG: No results for input(s): GLUCAP in the last 168 hours. Lipid Profile: No results for input(s): CHOL, HDL, LDLCALC, TRIG, CHOLHDL, LDLDIRECT in the last 72 hours. Thyroid Function Tests: No results for input(s): TSH, T4TOTAL, FREET4, T3FREE, THYROIDAB in the last 72 hours. Anemia Panel: No results for input(s): VITAMINB12, FOLATE, FERRITIN, TIBC, IRON, RETICCTPCT in the last 72 hours. Urine analysis:    Component Value Date/Time   COLORURINE AMBER (A) 12/13/2018 2218   APPEARANCEUR HAZY (A) 12/13/2018 2218   LABSPEC 1.027 12/13/2018 2218   PHURINE 5.0 12/13/2018 2218   GLUCOSEU NEGATIVE 12/13/2018 2218   HGBUR SMALL (A) 12/13/2018 2218   HGBUR small 06/28/2009 1504   BILIRUBINUR SMALL (A) 12/13/2018 2218   KETONESUR 5 (A) 12/13/2018 2218    PROTEINUR 30 (A) 12/13/2018 2218   UROBILINOGEN 1.0 09/28/2014 1426   NITRITE NEGATIVE 12/13/2018 2218   LEUKOCYTESUR TRACE (A) 12/13/2018 2218   Sepsis Labs: @LABRCNTIP (procalcitonin:4,lacticidven:4)  ) No results found for this or any previous visit (from the past 240 hour(s)).    Radiology Studies: No results found.   Scheduled Meds: . enoxaparin (LOVENOX) injection  40 mg Subcutaneous Q24H  . feeding supplement (ENSURE ENLIVE)  237 mL Oral BID BM  . folic acid  1 mg Oral Daily  . ketoconazole   Topical BID  . multivitamin with minerals  1 tablet Oral Daily  . nicotine  14 mg Transdermal Daily  . polyethylene glycol  17 g Oral BID  . senna-docusate  1 tablet Oral BID  . thiamine  100 mg Oral Daily   Continuous Infusions:   LOS: 117 days   Time Spent in minutes   20 minutes  Marcell Pfeifer D.O. on 04/09/2019 at 9:01 AM  Between 7am to 7pm - Please see pager noted on [email or web address]  After 7pm go to [email or web address]  And look for the night coverage person covering for me after hours  [facility] Office  [phone]

## 2019-04-10 MED ORDER — TUBERCULIN PPD 5 UNIT/0.1ML ID SOLN
5.0000 [IU] | Freq: Once | INTRADERMAL | Status: AC
  Administered 2019-04-10: 5 [IU] via INTRADERMAL
  Filled 2019-04-10: qty 0.1

## 2019-04-10 NOTE — TOC Progression Note (Addendum)
Transition of Care [facility]) - Progression Note    Patient Details  Name: ADASYN MCADAMS MRN: [id number] Date of Birth: 10-15-1955  [facility] CM/SW Contact  Mearl Latin, LCSW Phone Number: 04/10/2019, 8:53 AM  Clinical Narrative:    CSW received call from Lyman Bishop at [facility]. She states they have bed availability and she and the facility administrator Leota Jacobsen) would like to come interview patient on Wednesday morning and discuss the details of a letter of guarantee with [facility] Director. They have communicated with patient's niece who is also in agreement with plan. CSW to arrange interview and requested TB test from MD. Will request COVID test tomorrow in the event the ALF can accept patient.    Expected Discharge Plan: Skilled Nursing Facility Barriers to Discharge: No SNF bed, SNF Pending payor source - LOG  Expected Discharge Plan and Services Expected Discharge Plan: Skilled Nursing Facility In-house Referral: Clinical Social Work Discharge Planning Services: NA Post Acute Care Choice: Skilled Nursing Facility Living arrangements for the past 2 months: Apartment                 DME Arranged: N/A DME Agency: NA                   Social Determinants of Health (SDOH) Interventions    Readmission Risk Interventions No flowsheet data found.

## 2019-04-10 NOTE — Progress Notes (Signed)
PROGRESS NOTE    Marie Woods  VFI:[id number] DOB: 1955/09/03 DOA: 12/13/2018 PCP: Marcine Matar, MD   Brief Narrative:  64 year old female with history of asthma, hypertension, alcohol abuse was brought to the hospital for confusion and altered mental status. Found to have UTI. MRI suggestive of possible Warnicke's encephalopathy, patient was treated with thiamine. PT and OT have recommended SNF. Patient has been difficult to place.  Assessment & Plan   Severe sepsis secondary to UTI -Present on admission -Urine culture showed multiple species, repeat culture showed less than 10,000 colonies -Was initially treated with IV fluids and Rocephin -Sepsis resolved -Chest x-ray was negative for infection -Currently hemodynamically stable  Acute metabolic encephalopathy/possible Wernicke's encephalopathy -CT head showed mild atrophy neck changes without acute intercranial normality -MRI brain showed symmetric diffusion weighted and T2/FLAIR hyperintense signal abnormality within the medial thalami, favoring Wernicke's encephalopathy -Alcohol level was less than 10 on admission -Urine drug screen was negative -Patient completed IV thiamine and is currently on oral thiamine -Continue folic acid, multivitamin -Patient will need outpatient follow-up with neurology and consider neuropsychiatry evaluation -? Underlying dementia with memory issues as mental status has been fluctuating. Although today patient does appear to be alert and oriented x3 -Of note, patient is on Haldol and Ativan as needed for extreme agitation; hydroxyzine for anxiety  Essential hypertension -Norvasc discontinued due to soft BP -BP currently 101/74  Alcohol abuse -Treated with CIWA protocol -No alcohol withdrawal noted -Continue thiamine, folate, multivitamin  Tobacco abuse -Nicotine patch  Generalized weakness -PT/OT recommending SNF -Patient needs supervision given her short-term memory impairment.     Normocytic anemia -Stable, no signs of bleeding -Continue to monitor CBC  Chest tightness/shortness of breath and dizziness -Resolved, likely transient  -Was not orthostatic   Constipation -Continue bowel regimen  DVT Prophylaxis Lovenox  Code Status: Full  Family Communication: None at bedside  Disposition Plan: Admitted. Patient presented from home with acute metabolic encephalopathy and sepsis secondary to UTI. Mental status waxes and wanes therefore she is unsafe to be discharged to home alone- continues to need SNF placement however difficult to place. ALF to review patient, TB skin testing ordered and will need to followed in 72 hours.  Consultants Neurology Psychiatry  Procedures  None  Antibiotics   Anti-infectives (From admission, onward)   Start     Dose/Rate Route Frequency Ordered Stop   12/14/18 0100  cefTRIAXone (ROCEPHIN) 1 g in sodium chloride 0.9 % 100 mL IVPB  Status:  Discontinued     1 g 200 mL/hr over 30 Minutes Intravenous Every 24 hours 12/14/18 0010 12/17/18 4166      Subjective:   Marie Woods seen and examined today.  Patient with no complaints today. Denies chest pain, shortness of breath, dental pain, nausea or vomiting, diarrhea constipation, dizziness or headache.  States it is too early in the day to tell how she is truly feeling.    Objective:   Vitals:   04/09/19 0811 04/09/19 1614 04/09/19 2333 04/10/19 0906  BP: 104/74 113/90 116/79 101/74  Pulse: 80 86 81 68  Resp: 16 16 20 16   Temp: 98.2 F (36.8 C) 98.2 F (36.8 C) 97.9 F (36.6 C) 98.6 F (37 C)  TempSrc:      SpO2: 100% 100% 100% 99%  Weight:      Height:        Intake/Output Summary (Last 24 hours) at 04/10/2019 0951 Last data filed at 04/10/2019 0913 Gross per 24 hour  Intake  960 ml  Output --  Net 960 ml   Filed Weights   12/15/18 0101 02/22/19 0359  Weight: 98.4 kg 67.1 kg   Exam  General: Well developed, well nourished, NAD, appears stated age  60:  NCAT, PERRLA, EOMI, Anicteic Sclera, mucous membranes moist.   Neck: Supple, no JVD, no masses  Cardiovascular: S1 S2 auscultated, RRR, no murmur  Respiratory: Clear to auscultation bilaterally  Abdomen: Soft, nontender, nondistended, + bowel sounds  Extremities: warm dry without cyanosis clubbing or edema  Neuro: AAOx2, nonfocal  Psych: Appropriate mood and affect  Data Reviewed: I have personally reviewed following labs and imaging studies  CBC: No results for input(s): WBC, NEUTROABS, HGB, HCT, MCV, PLT in the last 168 hours. Basic Metabolic Panel: No results for input(s): NA, K, CL, CO2, GLUCOSE, BUN, CREATININE, CALCIUM, MG, PHOS in the last 168 hours. GFR: Estimated Creatinine Clearance: 71.8 mL/min (by C-G formula based on SCr of 0.74 mg/dL). Liver Function Tests: No results for input(s): AST, ALT, ALKPHOS, BILITOT, PROT, ALBUMIN in the last 168 hours. No results for input(s): LIPASE, AMYLASE in the last 168 hours. No results for input(s): AMMONIA in the last 168 hours. Coagulation Profile: No results for input(s): INR, PROTIME in the last 168 hours. Cardiac Enzymes: No results for input(s): CKTOTAL, CKMB, CKMBINDEX, TROPONINI in the last 168 hours. BNP (last 3 results) No results for input(s): PROBNP in the last 8760 hours. HbA1C: No results for input(s): HGBA1C in the last 72 hours. CBG: No results for input(s): GLUCAP in the last 168 hours. Lipid Profile: No results for input(s): CHOL, HDL, LDLCALC, TRIG, CHOLHDL, LDLDIRECT in the last 72 hours. Thyroid Function Tests: No results for input(s): TSH, T4TOTAL, FREET4, T3FREE, THYROIDAB in the last 72 hours. Anemia Panel: No results for input(s): VITAMINB12, FOLATE, FERRITIN, TIBC, IRON, RETICCTPCT in the last 72 hours. Urine analysis:    Component Value Date/Time   COLORURINE AMBER (A) 12/13/2018 2218   APPEARANCEUR HAZY (A) 12/13/2018 2218   LABSPEC 1.027 12/13/2018 2218   PHURINE 5.0 12/13/2018 2218    GLUCOSEU NEGATIVE 12/13/2018 2218   HGBUR SMALL (A) 12/13/2018 2218   HGBUR small 06/28/2009 1504   BILIRUBINUR SMALL (A) 12/13/2018 2218   KETONESUR 5 (A) 12/13/2018 2218   PROTEINUR 30 (A) 12/13/2018 2218   UROBILINOGEN 1.0 09/28/2014 1426   NITRITE NEGATIVE 12/13/2018 2218   LEUKOCYTESUR TRACE (A) 12/13/2018 2218   Sepsis Labs: @LABRCNTIP (procalcitonin:4,lacticidven:4)  ) No results found for this or any previous visit (from the past 240 hour(s)).    Radiology Studies: No results found.   Scheduled Meds: . enoxaparin (LOVENOX) injection  40 mg Subcutaneous Q24H  . feeding supplement (ENSURE ENLIVE)  237 mL Oral BID BM  . folic acid  1 mg Oral Daily  . ketoconazole   Topical BID  . multivitamin with minerals  1 tablet Oral Daily  . nicotine  14 mg Transdermal Daily  . polyethylene glycol  17 g Oral BID  . senna-docusate  1 tablet Oral BID  . thiamine  100 mg Oral Daily   Continuous Infusions:   LOS: 118 days   Time Spent in minutes   20 minutes  Marie Woods D.O. on 04/10/2019 at 9:51 AM  Between 7am to 7pm - Please see pager noted on [email or web address]  After 7pm go to [email or web address]  And look for the night coverage person covering for me after hours  [facility] Office  [phone]

## 2019-04-11 LAB — QUANTIFERON-TB GOLD PLUS: QuantiFERON-TB Gold Plus: NEGATIVE

## 2019-04-11 LAB — QUANTIFERON-TB GOLD PLUS (RQFGPL)
QuantiFERON Mitogen Value: >10 IU/mL
QuantiFERON Nil Value: 0.04 IU/mL
QuantiFERON TB1 Ag Value: 0.04 IU/mL
QuantiFERON TB2 Ag Value: 0.04 IU/mL

## 2019-04-11 NOTE — Progress Notes (Signed)
PROGRESS NOTE    Marie Woods  KZS:[id number] DOB: 04/08/1955 DOA: 12/13/2018 PCP: Ladell Pier, MD   Brief Narrative:  64 year old female with history of asthma, hypertension, alcohol abuse was brought to the hospital for confusion and altered mental status. Found to have UTI. MRI suggestive of possible Warnicke's encephalopathy, patient was treated with thiamine. PT and OT have recommended SNF. Patient has been difficult to place.  Assessment & Plan   Severe sepsis secondary to UTI -Present on admission -Urine culture showed multiple species, repeat culture showed less than 10,000 colonies -Was initially treated with IV fluids and Rocephin -Sepsis resolved -Chest x-ray was negative for infection -Currently hemodynamically stable  Acute metabolic encephalopathy/possible Wernicke's encephalopathy -CT head showed mild atrophy neck changes without acute intercranial normality -MRI brain showed symmetric diffusion weighted and T2/FLAIR hyperintense signal abnormality within the medial thalami, favoring Wernicke's encephalopathy -Alcohol level was less than 10 on admission -Urine drug screen was negative -Patient completed IV thiamine and is currently on oral thiamine -Continue folic acid, multivitamin -Patient will need outpatient follow-up with neurology and consider neuropsychiatry evaluation -? Underlying dementia with memory issues as mental status has been fluctuating. Although today patient does appear to be alert and oriented x3 -Of note, patient is on Haldol and Ativan as needed for extreme agitation; hydroxyzine for anxiety  Essential hypertension -Norvasc discontinued due to soft BP -BP currently 107/73  Alcohol abuse -Treated with CIWA protocol -No alcohol withdrawal noted -Continue thiamine, folate, multivitamin  Tobacco abuse -Nicotine patch  Generalized weakness -PT/OT recommending SNF -Patient needs supervision given her short-term memory impairment.     Normocytic anemia -Stable, no signs of bleeding -Continue to monitor CBC  Chest tightness/shortness of breath and dizziness -Resolved, likely transient  -Was not orthostatic   Constipation -Continue bowel regimen  DVT Prophylaxis Lovenox  Code Status: Full  Family Communication: None at bedside  Disposition Plan: Admitted. Patient presented from home with acute metabolic encephalopathy and sepsis secondary to UTI. Mental status waxes and wanes therefore she is unsafe to be discharged to home alone- continues to need SNF placement however difficult to place. ALF to review patient, TB skin testing ordered and will need to followed in 48 hours.  Consultants Neurology Psychiatry  Procedures  None  Antibiotics   Anti-infectives (From admission, onward)   Start     Dose/Rate Route Frequency Ordered Stop   12/14/18 0100  cefTRIAXone (ROCEPHIN) 1 g in sodium chloride 0.9 % 100 mL IVPB  Status:  Discontinued     1 g 200 mL/hr over 30 Minutes Intravenous Every 24 hours 12/14/18 0010 12/17/18 7322      Subjective:   Linell Meldrum seen and examined today. No complaints today. Denies chest pain or shortness.    Objective:   Vitals:   04/09/19 1614 04/09/19 2333 04/10/19 0906 04/10/19 1649  BP: 113/90 116/79 101/74 107/73  Pulse: 86 81 68 79  Resp: 16 20 16 16   Temp: 98.2 F (36.8 C) 97.9 F (36.6 C) 98.6 F (37 C) 97.8 F (36.6 C)  TempSrc:      SpO2: 100% 100% 99% 100%  Weight:      Height:        Intake/Output Summary (Last 24 hours) at 04/11/2019 0856 Last data filed at 04/10/2019 0913 Gross per 24 hour  Intake 240 ml  Output --  Net 240 ml   Filed Weights   12/15/18 0101 02/22/19 0359  Weight: 98.4 kg 67.1 kg   Exam  General:  Well developed, well nourished, NAD, appears stated age  HEENT: NCAT, mucous membranes moist.   Cardiovascular: S1 S2 auscultated, RRR, no murmur  Respiratory: Clear to auscultation bilaterally  Abdomen: Soft, nontender,  nondistended, + bowel sounds  Extremities: warm dry without cyanosis clubbing or edema  Neuro: AAOx2 (self, place), nonfocal  Psych: Appropriate mood and affect  Data Reviewed: I have personally reviewed following labs and imaging studies  CBC: No results for input(s): WBC, NEUTROABS, HGB, HCT, MCV, PLT in the last 168 hours. Basic Metabolic Panel: No results for input(s): NA, K, CL, CO2, GLUCOSE, BUN, CREATININE, CALCIUM, MG, PHOS in the last 168 hours. GFR: Estimated Creatinine Clearance: 71.8 mL/min (by C-G formula based on SCr of 0.74 mg/dL). Liver Function Tests: No results for input(s): AST, ALT, ALKPHOS, BILITOT, PROT, ALBUMIN in the last 168 hours. No results for input(s): LIPASE, AMYLASE in the last 168 hours. No results for input(s): AMMONIA in the last 168 hours. Coagulation Profile: No results for input(s): INR, PROTIME in the last 168 hours. Cardiac Enzymes: No results for input(s): CKTOTAL, CKMB, CKMBINDEX, TROPONINI in the last 168 hours. BNP (last 3 results) No results for input(s): PROBNP in the last 8760 hours. HbA1C: No results for input(s): HGBA1C in the last 72 hours. CBG: No results for input(s): GLUCAP in the last 168 hours. Lipid Profile: No results for input(s): CHOL, HDL, LDLCALC, TRIG, CHOLHDL, LDLDIRECT in the last 72 hours. Thyroid Function Tests: No results for input(s): TSH, T4TOTAL, FREET4, T3FREE, THYROIDAB in the last 72 hours. Anemia Panel: No results for input(s): VITAMINB12, FOLATE, FERRITIN, TIBC, IRON, RETICCTPCT in the last 72 hours. Urine analysis:    Component Value Date/Time   COLORURINE AMBER (A) 12/13/2018 2218   APPEARANCEUR HAZY (A) 12/13/2018 2218   LABSPEC 1.027 12/13/2018 2218   PHURINE 5.0 12/13/2018 2218   GLUCOSEU NEGATIVE 12/13/2018 2218   HGBUR SMALL (A) 12/13/2018 2218   HGBUR small 06/28/2009 1504   BILIRUBINUR SMALL (A) 12/13/2018 2218   KETONESUR 5 (A) 12/13/2018 2218   PROTEINUR 30 (A) 12/13/2018 2218    UROBILINOGEN 1.0 09/28/2014 1426   NITRITE NEGATIVE 12/13/2018 2218   LEUKOCYTESUR TRACE (A) 12/13/2018 2218   Sepsis Labs: @LABRCNTIP (procalcitonin:4,lacticidven:4)  ) No results found for this or any previous visit (from the past 240 hour(s)).    Radiology Studies: No results found.   Scheduled Meds: . enoxaparin (LOVENOX) injection  40 mg Subcutaneous Q24H  . feeding supplement (ENSURE ENLIVE)  237 mL Oral BID BM  . folic acid  1 mg Oral Daily  . ketoconazole   Topical BID  . multivitamin with minerals  1 tablet Oral Daily  . nicotine  14 mg Transdermal Daily  . polyethylene glycol  17 g Oral BID  . senna-docusate  1 tablet Oral BID  . thiamine  100 mg Oral Daily  . tuberculin  5 Units Intradermal Once   Continuous Infusions:   LOS: 119 days   Time Spent in minutes   20 minutes  Taygan Connell D.O. on 04/11/2019 at 8:56 AM  Between 7am to 7pm - Please see pager noted on [email or web address]  After 7pm go to [email or web address]  And look for the night coverage person covering for me after hours  [facility] Office  [phone]

## 2019-04-11 NOTE — TOC Progression Note (Addendum)
Transition of Care [facility]) - Progression Note    Patient Details  Name: Marie Woods MRN: [id number] Date of Birth: [birth year]  Transition of Care [facility]) CM/SW Contact  Mearl Latin, LCSW Phone Number: 04/11/2019, 12:49 PM  Clinical Narrative:    CSW spoke with [facility] Administrator, Leota Jacobsen [phone]) to confirm that he is going to come interview patient tomorrow. He reports that unless something comes up, he will. CSW provided him with [facility] Director contact info as he will need to complete a screening prior to coming into the hospital.   CSW spoke with patient and provided info on the facility. Patient reports that she does not want to go to [place]. CSW explained that if they can accept patient, she does not have any other options. She states her family will take her. CSE explained that they have been unwilling to pick her up due to their work schedules. She stated her half-sister, Jamesetta So, can take her. CSW explained that Jamesetta So has stated that she is unable to assist patient at this time. Patient stated she will speak with her family about it tonight and consider the facility. She asked several times if "[place] was [place] [employer]   CSW spoke with patient's niece, Bjorn Loser. She also reported frustration with the facility being in [place], where no family can get to patient (though no visitors allowed in the building due to COVID restrictions). CSW explained that the only current alternative is for family to pick patient up. She stated they are unable to do that and she is in the process of moving to [place]. She requested CSW keep her updated.    Expected Discharge Plan: Skilled Nursing Facility Barriers to Discharge: No SNF bed, SNF Pending payor source - LOG  Expected Discharge Plan and Services Expected Discharge Plan: Skilled Nursing Facility In-house Referral: Clinical Social Work Discharge Planning Services: NA Post Acute Care Choice: Skilled Nursing Facility Living  arrangements for the past 2 months: Apartment                 DME Arranged: N/A DME Agency: NA                   Social Determinants of Health (SDOH) Interventions    Readmission Risk Interventions No flowsheet data found.

## 2019-04-12 LAB — SARS CORONAVIRUS 2 (TAT 6-24 HRS): SARS Coronavirus 2: NEGATIVE

## 2019-04-12 NOTE — Progress Notes (Addendum)
PROGRESS NOTE    Marie Woods  KGU:[id number] DOB: 20-Jun-1955 DOA: 12/13/2018 PCP: Marcine Matar, MD   Brief Narrative:  The patient is a 64 year old African-American female with a past medical history significant for but not limited to asthma, hypertension, alcohol abuse who originally presented on 12/13/2018 for confusion and altered mental status.  She was found to have a UTI with urine cultures showing multiple species.  MRI of the brain was done and was suggestive of possible Wernicke's encephalopathy. She was treated with thiamine.  She subsequently improved and PT OT recommended SNF placement.  She is unsafe to discharge home given her mental status fluctuating with underlying dementia and suspected difficulty with memory issues.  She will need to be discharged to skilled nursing facility for safe disposition however currently bed is unavailable per social worker and they are in the process of attempting to place the patient but disposition is very difficult. Currently an ALF is reviewing her case and they are to come by this morning to see the patient if she is a good fit for their facility.  Patient does not have a place to go as her apartment had to be let go due to able to hold it any more.  Assessment & Plan:   Principal Problem:   Wernicke encephalopathy Active Problems:   History of alcohol use   TOBACCO ABUSE   Essential hypertension   AMS (altered mental status)   Confusion   Acute metabolic encephalopathy   Agitation   Acute lower UTI   Sepsis (HCC)   Sepsis secondary to UTI (HCC)   Normocytic anemia  Severe sepsis 2/2 UTI: Present on admission UTI -Urine culture showed multiple species. Repeat culture showed less than 10,000 colonies.  -Was initially treated with IV Rocephin and fluids.  -Subsequently antibiotics have been discontinued. -Sepsis physiology has resolved and she is hemodynamically stable -Chest x-ray was negative for infection. -Currently  hemodynamically stable  Acute metabolic encephalopathy Possible Wernicke's encephalopathy CT of the head showed mild atrophic changes without acute intracranial abnormality -MRI of the brain: Symmetric diffusion weighted and T2/FLAIR hyperintense signal abnormality within the medial thalami. Findings favor sequelae of Wernicke's encephalopathy -There was question of patient having alcohol abuse however alcohol level less than 10.  -Urine drug screen negative -Completed IV thiamine. Currently on oral thiamine now.  -Continue folic acid, multivitamin. -Outpatient follow-up with Neurology and consider Neuropsychiatry Evaluation -Continue with haloperidol lactate 5 mg IM every 6 hours as needed for Agitation and with lorazepam 1 mg IM every 6 hours as needed for extreme agitation not resolved by the Haldol -Continue with hydroxyzine 25 mg p.o. twice daily as needed for anxiety -Mental status has been fluctuating. Suspect underlying dementia with memory issues.   Essential hypertension -Amlodipine 5 mg po Daily discontinued due to soft BP -BP currently 107/73  History of Alcohol Abuse -Treated with CIWA protocol.  -Continue folate, thiamine and multivitamin.  -Continue with hydroxyzine as above -Currently not withdrawing   History of Tobacco Abuse -Continue nicotine patch 14 mg transdermal patch every 24 hours  Generalized Weakness and Deconditioning -PT/OT recommending SNF. Social worker following. -Patient does need supervision as she appears to have short-term memory impairment.   Normocytic Anemia -Patient's hemoglobin/hematocrit was 12.6/38.5 on last check (04/03/19) -Continue monitor and trend CBC intermittently and follow for signs and symptoms of bleeding  Chest tightness/shortness of breath and dizziness -Resolved, likely transient  -Was not orthostatic   Hyperglycemia -Mild at 101 on 2/23 -Continue to  Monitor and Trend  -Repeat BMP intermittently    Constipation -Started Bowel Regimen with Miralax 17 grams po BID and with Senna-Docusate 1 tab po BID  DVT prophylaxis: Lovenox 40 mg sq q24h Code Status: FULL CODE Family Communication: No family present at bedside  Disposition Plan: Patient has been admitted from home with acute metabolic encephalopathy and sepsis [place] to urinary tract infection.  Her mental status waxes and wanes therefore she is an unsafe to be discharged home alone.  She continues to require SNF placement however it is been difficult to place.  Currently ALF is reviewing the patient and required a TB skin testing as well as a Covid test which is   Consultants:   Neurology  Psychiatry    Procedures: None   Antimicrobials:  Anti-infectives (From admission, onward)   Start     Dose/Rate Route Frequency Ordered Stop   12/14/18 0100  cefTRIAXone (ROCEPHIN) 1 g in sodium chloride 0.9 % 100 mL IVPB  Status:  Discontinued     1 g 200 mL/hr over 30 Minutes Intravenous Every 24 hours 12/14/18 0010 12/17/18 0948     Subjective: Seen and examined at bedside and she was sleeping during the encounter and briefly woke up.  Had no complaints.  The administrator at [facility] had not come by yet.  No other complaints if patient were to be left alone.  Objective: Vitals:   04/10/19 1649 04/11/19 1133 04/11/19 1555 04/12/19 1013  BP: 107/73 132/90 (!) 131/94 (!) 87/59  Pulse: 79 80 86 62  Resp: 16 16 16 18   Temp: 97.8 F (36.6 C) 97.8 F (36.6 C) 97.7 F (36.5 C) 98.4 F (36.9 C)  TempSrc:    Oral  SpO2: 100% 100% 100% 98%  Weight:      Height:       No intake or output data in the 24 hours ending 04/12/19 1211 Filed Weights   12/15/18 0101 02/22/19 0359  Weight: 98.4 kg 67.1 kg   Examination: Physical Exam:  Constitutional:  Nourished, well-developed African-American female currently in no acute distress appears calm and she is sleeping Respiratory:  Diminished to auscultation bilaterally no  appreciable wheezing, rales, rhonchi.  Patient not tachypneic or using any accessory muscles to breathe Cardiovascular:  Regular rate and rhythm.  No appreciable murmurs, rubs, gallops Abdomen:  Soft, nontender, slightly distended secondary to her body habitus. Neurologic:  Patient did not want to follow commands and she is sleeping and her to be left alone Psychiatric:  Has a calm mood and she is somnolent and drowsy wanting to be left alone.  Data Reviewed: I have personally reviewed following labs and imaging studies  CBC: No results for input(s): WBC, NEUTROABS, HGB, HCT, MCV, PLT in the last 168 hours. Basic Metabolic Panel: No results for input(s): NA, K, CL, CO2, GLUCOSE, BUN, CREATININE, CALCIUM, MG, PHOS in the last 168 hours. GFR: Estimated Creatinine Clearance: 71.8 mL/min (by C-G formula based on SCr of 0.74 mg/dL). Liver Function Tests: No results for input(s): AST, ALT, ALKPHOS, BILITOT, PROT, ALBUMIN in the last 168 hours. No results for input(s): LIPASE, AMYLASE in the last 168 hours. No results for input(s): AMMONIA in the last 168 hours. Coagulation Profile: No results for input(s): INR, PROTIME in the last 168 hours. Cardiac Enzymes: No results for input(s): CKTOTAL, CKMB, CKMBINDEX, TROPONINI in the last 168 hours. BNP (last 3 results) No results for input(s): PROBNP in the last 8760 hours. HbA1C: No results for input(s): HGBA1C in the  last 72 hours. CBG: No results for input(s): GLUCAP in the last 168 hours. Lipid Profile: No results for input(s): CHOL, HDL, LDLCALC, TRIG, CHOLHDL, LDLDIRECT in the last 72 hours. Thyroid Function Tests: No results for input(s): TSH, T4TOTAL, FREET4, T3FREE, THYROIDAB in the last 72 hours. Anemia Panel: No results for input(s): VITAMINB12, FOLATE, FERRITIN, TIBC, IRON, RETICCTPCT in the last 72 hours. Sepsis Labs: No results for input(s): PROCALCITON, LATICACIDVEN in the last 168 hours.  No results found for this or any  previous visit (from the past 240 hour(s)).   RN Pressure Injury Documentation:     Estimated body mass index is 23.18 kg/m as calculated from the following:   Height as of this encounter: 5\' 7"  (1.702 m).   Weight as of this encounter: 67.1 kg.  Malnutrition Type:  Nutrition Problem: Increased nutrient needs Etiology: acute illness   Malnutrition Characteristics:  Signs/Symptoms: estimated needs   Nutrition Interventions:  Interventions: Ensure Enlive (each supplement provides 350kcal and 20 grams of protein), MVI  Radiology Studies: No results found.  Scheduled Meds: . enoxaparin (LOVENOX) injection  40 mg Subcutaneous Q24H  . feeding supplement (ENSURE ENLIVE)  237 mL Oral BID BM  . folic acid  1 mg Oral Daily  . ketoconazole   Topical BID  . multivitamin with minerals  1 tablet Oral Daily  . nicotine  14 mg Transdermal Daily  . polyethylene glycol  17 g Oral BID  . senna-docusate  1 tablet Oral BID  . thiamine  100 mg Oral Daily   Continuous Infusions:   LOS: 120 days   Kerney Elbe, DO [facility] PAGER is on AMION  If 7PM-7AM, please contact night-coverage [email or web address]

## 2019-04-12 NOTE — Progress Notes (Addendum)
RN read TB test from 04/10/19 & test negative.  Will continue to monitor.

## 2019-04-12 NOTE — TOC Progression Note (Signed)
Transition of Care [facility]) - Progression Note    Patient Details  Name: HILDA RYNDERS MRN: [id number] Date of Birth: 03-16-1955  Transition of Care [facility]) CM/SW Contact  Mearl Latin, LCSW Phone Number: 04/12/2019, 9:12 AM  Clinical Narrative:    CSW received call from Mr. Manson Passey, administrator at [facility]. He will be coming to the hospital this morning, hopefully by 10:30am, to interview the patient to see if she will be a good fit for their facility. TOC Director aware. CSW spoke with patient's half-sister, Jamesetta So. She again stated that family could not care for her (sister passed away and brother is too ill) and reported that she hopes [facility] can accept her. She states they had to let patient's apartment go due to being unable to hold it anymore. She states she is available by phone if she needs to speak to patient to encourage her to go to the facility. CSW to continue to follow and coordinate with [facility].    Expected Discharge Plan: Skilled Nursing Facility Barriers to Discharge: No SNF bed, SNF Pending payor source - LOG  Expected Discharge Plan and Services Expected Discharge Plan: Skilled Nursing Facility In-house Referral: Clinical Social Work Discharge Planning Services: NA Post Acute Care Choice: Skilled Nursing Facility Living arrangements for the past 2 months: Apartment                 DME Arranged: N/A DME Agency: NA                   Social Determinants of Health (SDOH) Interventions    Readmission Risk Interventions No flowsheet data found.

## 2019-04-13 NOTE — TOC Progression Note (Signed)
Transition of Care [facility]) - Progression Note    Patient Details  Name: RONAE NOELL MRN: [id number] Date of Birth: 1955/07/06  Transition of Care [facility]) CM/SW Contact  Mearl Latin, LCSW Phone Number: 04/13/2019, 9:38 AM  Clinical Narrative:    CSW contacted Mr. Manson Passey with [facility]. He states he will contact CSW back in an hour, though he is definitely interested in the patient. He asks how long the hospital will pay for the patient to stay at the facility. CSW referred him to [facility] Director to discuss details of the Letter of Guarantee and he stated agreement. CSW left a voicemail for patient's DSS worker, Ms. Janee Morn [phone]) to check the status of patient's Medicaid application.    Expected Discharge Plan: Skilled Nursing Facility Barriers to Discharge: No SNF bed, SNF Pending payor source - LOG  Expected Discharge Plan and Services Expected Discharge Plan: Skilled Nursing Facility In-house Referral: Clinical Social Work Discharge Planning Services: NA Post Acute Care Choice: Skilled Nursing Facility Living arrangements for the past 2 months: Apartment                 DME Arranged: N/A DME Agency: NA                   Social Determinants of Health (SDOH) Interventions    Readmission Risk Interventions No flowsheet data found.

## 2019-04-13 NOTE — TOC Progression Note (Signed)
Transition of Care [facility]) - Progression Note    Patient Details  Name: Marie Woods MRN: [id number] Date of Birth: 11/22/1955  Transition of Care [facility]) CM/SW Contact  Mearl Latin, LCSW Phone Number: 04/13/2019, 4:08 PM  Clinical Narrative:    CSW again spoke with Marie Woods. He clarified financial questions and stated he would contact CSW in the morning.    Expected Discharge Plan: Skilled Nursing Facility Barriers to Discharge: No SNF bed, SNF Pending payor source - LOG  Expected Discharge Plan and Services Expected Discharge Plan: Skilled Nursing Facility In-house Referral: Clinical Social Work Discharge Planning Services: NA Post Acute Care Choice: Skilled Nursing Facility Living arrangements for the past 2 months: Apartment                 DME Arranged: N/A DME Agency: NA                   Social Determinants of Health (SDOH) Interventions    Readmission Risk Interventions No flowsheet data found.

## 2019-04-13 NOTE — Progress Notes (Signed)
PROGRESS NOTE    Marie Woods  ZOX:[id number] DOB: Apr 27, 1955 DOA: 12/13/2018 PCP: Ladell Pier, MD   Brief Narrative:  The patient is a 64 year old African-American female with a past medical history significant for but not limited to asthma, hypertension, alcohol abuse who originally presented on 12/13/2018 for confusion and altered mental status.  She was found to have a UTI with urine cultures showing multiple species.  MRI of the brain was done and was suggestive of possible Wernicke's encephalopathy. She was treated with thiamine.  She subsequently improved and PT OT recommended SNF placement.  She is unsafe to discharge home given her mental status fluctuating with underlying dementia and suspected difficulty with memory issues.  She will need to be discharged to skilled nursing facility for safe disposition however currently bed is unavailable per social worker and they are in the process of attempting to place the patient but disposition is very difficult. Currently an ALF is reviewing her case and they came by yesterday morning to see the patient if she is a good fit for their facility and still awaiting decision today.  Patient does not have a place to go as her apartment had to be let go due to able to hold it any more.  Assessment & Plan:   Principal Problem:   Wernicke encephalopathy Active Problems:   History of alcohol use   TOBACCO ABUSE   Essential hypertension   AMS (altered mental status)   Confusion   Acute metabolic encephalopathy   Agitation   Acute lower UTI   Sepsis (HCC)   Sepsis secondary to UTI (HCC)   Normocytic anemia  Severe sepsis 2/2 UTI: Present on admission UTI -Urine culture showed multiple species. Repeat culture showed less than 10,000 colonies.  -Was initially treated with IV Rocephin and fluids.  -Subsequently antibiotics have been discontinued. -Sepsis physiology has resolved and she is hemodynamically stable -Chest x-ray was  negative for infection. -Currently hemodynamically stable  Acute metabolic encephalopathy Possible Wernicke's encephalopathy CT of the head showed mild atrophic changes without acute intracranial abnormality -MRI of the brain: Symmetric diffusion weighted and T2/FLAIR hyperintense signal abnormality within the medial thalami. Findings favor sequelae of Wernicke's encephalopathy -There was question of patient having alcohol abuse however alcohol level less than 10.  -Urine drug screen negative -Completed IV thiamine. Currently on oral thiamine now.  -Continue folic acid, multivitamin. -Outpatient follow-up with Neurology and consider Neuropsychiatry Evaluation -Continue with haloperidol lactate 5 mg IM every 6 hours as needed for Agitation and with lorazepam 1 mg IM every 6 hours as needed for extreme agitation not resolved by the Haldol -Continue with hydroxyzine 25 mg p.o. twice daily as needed for anxiety -Mental status has been fluctuating. Suspect underlying dementia with memory issues.   Essential hypertension -Amlodipine 5 mg po Daily discontinued due to soft BP -BP currently 94/68  History of Alcohol Abuse -Treated with CIWA protocol.  -Continue folate, thiamine and multivitamin.  -Continue with hydroxyzine as above -Currently not withdrawing   History of Tobacco Abuse -Continue nicotine patch 14 mg transdermal patch every 24 hours  Generalized Weakness and Deconditioning -PT/OT recommending SNF. Social worker following. -Patient does need supervision as she appears to have short-term memory impairment.   Normocytic Anemia -Patient's hemoglobin/hematocrit was 12.6/38.5 on last check (04/03/19) -Continue monitor and trend CBC intermittently and follow for signs and symptoms of bleeding  Chest tightness/shortness of breath and dizziness -Resolved, likely transient  -Was not orthostatic   Hyperglycemia -Mild at 101 on  2/23 -Continue to Monitor and Trend   -Repeat BMP intermittently   Constipation -Started Bowel Regimen with Miralax 17 grams po BID and with Senna-Docusate 1 tab po BID  DVT prophylaxis: Lovenox 40 mg sq q24h Code Status: FULL CODE Family Communication: No family present at bedside  Disposition Plan: Patient has been admitted from home with acute metabolic encephalopathy and sepsis Derry to urinary tract infection.  Her mental status waxes and wanes therefore she is an unsafe to be discharged home alone.  She continues to require SNF placement however it is been difficult to place.  Currently ALF is reviewing the patient and required a TB skin testing as well as a Covid test which is Negative. ALF interested in the patient but still need to make a decision about the patient   Consultants:   Neurology  Psychiatry    Procedures: None   Antimicrobials:  Anti-infectives (From admission, onward)   Start     Dose/Rate Route Frequency Ordered Stop   12/14/18 0100  cefTRIAXone (ROCEPHIN) 1 g in sodium chloride 0.9 % 100 mL IVPB  Status:  Discontinued     1 g 200 mL/hr over 30 Minutes Intravenous Every 24 hours 12/14/18 0010 12/17/18 0948     Subjective: Seen and examined at bedside and she is more awake today and had no complaints.  Denies chest pain, lightheadedness or dizziness.  States that she slept well.  No other concerns or complaints at this time.  Objective: Vitals:   04/12/19 1013 04/12/19 1711 04/12/19 2122 04/13/19 0826  BP: (!) 87/59 112/77 100/61 94/68  Pulse: 62 83 78 65  Resp: 18 18  18   Temp: 98.4 F (36.9 C) 98.4 F (36.9 C) 98.2 F (36.8 C) 98.6 F (37 C)  TempSrc: Oral Oral Oral   SpO2: 98% 100% 96% 95%  Weight:      Height:        Intake/Output Summary (Last 24 hours) at 04/13/2019 1224 Last data filed at 04/13/2019 0745 Gross per 24 hour  Intake 240 ml  Output -  Net 240 ml   Filed Weights   12/15/18 0101 02/22/19 0359  Weight: 98.4 kg 67.1 kg   Examination: Physical Exam:   Constitutional:Well-nourished, well-developed African-American female currently in no acute distress appears calm and comfortable Respiratory:  Clear to auscultation bilaterally with no appreciable wheezing, rales, rhonchi.  Patient not tachypneic or using any accessory muscles with Cardiovascular:  Regular rate and rhythm.  No appreciable murmurs, rubs, gallops. Abdomen:  Soft, nontender, slightly distended secondary to her body habitus.  Bowel sounds present Neurologic:  Cranial nerves II through XII grossly intact no appreciable focal deficits. Psychiatric:  Patient is pleasant and she is awake and alert and oriented.  Has a calm affect and mood.  Data Reviewed: I have personally reviewed following labs and imaging studies  CBC: No results for input(s): WBC, NEUTROABS, HGB, HCT, MCV, PLT in the last 168 hours. Basic Metabolic Panel: No results for input(s): NA, K, CL, CO2, GLUCOSE, BUN, CREATININE, CALCIUM, MG, PHOS in the last 168 hours. GFR: Estimated Creatinine Clearance: 71.8 mL/min (by C-G formula based on SCr of 0.74 mg/dL). Liver Function Tests: No results for input(s): AST, ALT, ALKPHOS, BILITOT, PROT, ALBUMIN in the last 168 hours. No results for input(s): LIPASE, AMYLASE in the last 168 hours. No results for input(s): AMMONIA in the last 168 hours. Coagulation Profile: No results for input(s): INR, PROTIME in the last 168 hours. Cardiac Enzymes: No results for input(s):  CKTOTAL, CKMB, CKMBINDEX, TROPONINI in the last 168 hours. BNP (last 3 results) No results for input(s): PROBNP in the last 8760 hours. HbA1C: No results for input(s): HGBA1C in the last 72 hours. CBG: No results for input(s): GLUCAP in the last 168 hours. Lipid Profile: No results for input(s): CHOL, HDL, LDLCALC, TRIG, CHOLHDL, LDLDIRECT in the last 72 hours. Thyroid Function Tests: No results for input(s): TSH, T4TOTAL, FREET4, T3FREE, THYROIDAB in the last 72 hours. Anemia Panel: No results for  input(s): VITAMINB12, FOLATE, FERRITIN, TIBC, IRON, RETICCTPCT in the last 72 hours. Sepsis Labs: No results for input(s): PROCALCITON, LATICACIDVEN in the last 168 hours.  Recent Results (from the past 240 hour(s))  SARS CORONAVIRUS 2 (TAT 6-24 HRS) Nasopharyngeal Nasopharyngeal Swab     Status: None   Collection Time: 04/12/19  9:02 AM   Specimen: Nasopharyngeal Swab  Result Value Ref Range Status   SARS Coronavirus 2 NEGATIVE NEGATIVE Final    Comment: (NOTE) SARS-CoV-2 target nucleic acids are NOT DETECTED. The SARS-CoV-2 RNA is generally detectable in upper and lower respiratory specimens during the acute phase of infection. Negative results do not preclude SARS-CoV-2 infection, do not rule out co-infections with other pathogens, and should not be used as the sole basis for treatment or other patient management decisions. Negative results must be combined with clinical observations, patient history, and epidemiological information. The expected result is Negative. Fact Sheet for Patients: HairSlick.no Fact Sheet for Healthcare Providers: quierodirigir.com This test is not yet approved or cleared by the [place] FDA and  has been authorized for detection and/or diagnosis of SARS-CoV-2 by FDA under an Emergency Use Authorization (EUA). This EUA will remain  in effect (meaning this test can be used) for the duration of the COVID-19 declaration under Section 56 4(b)(1) of the Act, 21 U.S.C. section 360bbb-3(b)(1), unless the authorization is terminated or revoked sooner. Performed at [facility], [address], [place], [place] [address]      RN Pressure Injury Documentation:     Estimated body mass index is 23.18 kg/m as calculated from the following:   Height as of this encounter: 5\' 7"  (1.702 m).   Weight as of this encounter: 67.1 kg.  Malnutrition Type:  Nutrition Problem: Increased nutrient needs  Etiology: acute illness   Malnutrition Characteristics:  Signs/Symptoms: estimated needs   Nutrition Interventions:  Interventions: Ensure Enlive (each supplement provides 350kcal and 20 grams of protein), MVI  Radiology Studies: No results found.  Scheduled Meds: . enoxaparin (LOVENOX) injection  40 mg Subcutaneous Q24H  . feeding supplement (ENSURE ENLIVE)  237 mL Oral BID BM  . folic acid  1 mg Oral Daily  . ketoconazole   Topical BID  . multivitamin with minerals  1 tablet Oral Daily  . nicotine  14 mg Transdermal Daily  . polyethylene glycol  17 g Oral BID  . senna-docusate  1 tablet Oral BID  . thiamine  100 mg Oral Daily   Continuous Infusions:   LOS: 121 days   , DO [facility] PAGER is on AMION  If 7PM-7AM, please contact night-coverage [email or web address]

## 2019-04-14 NOTE — Progress Notes (Signed)
PROGRESS NOTE    Marie Woods  VZC:[id number] DOB: [birth year] DOA: 12/13/2018 PCP: Marcine Matar, MD   Brief Narrative:  The patient is a 64 year old African-American female with a past medical history significant for but not limited to asthma, hypertension, alcohol abuse who originally presented on 12/13/2018 for confusion and altered mental status.  She was found to have a UTI with urine cultures showing multiple species.  MRI of the brain was done and was suggestive of possible Wernicke's encephalopathy. She was treated with thiamine.  She subsequently improved and PT OT recommended SNF placement.  She is unsafe to discharge home given her mental status fluctuating with underlying dementia and suspected difficulty with memory issues.  She will need to be discharged to skilled nursing facility for safe disposition however currently bed is unavailable per social worker and they are in the process of attempting to place the patient but disposition is very difficult. ALF reviewed her case however she was unable to be accepted. Patient does not have a place to go as her apartment had to be let go due to able to hold it any more so she will need to remain in the hospital until a safe discharge disposition is obtained.  Assessment & Plan:   Principal Problem:   Wernicke encephalopathy Active Problems:   History of alcohol use   TOBACCO ABUSE   Essential hypertension   AMS (altered mental status)   Confusion   Acute metabolic encephalopathy   Agitation   Acute lower UTI   Sepsis (HCC)   Sepsis secondary to UTI (HCC)   Normocytic anemia  Severe sepsis 2/2 UTI: Present on Admission UTI -Urine culture showed multiple species. Repeat culture showed less than 10,000 colonies.  -Was initially treated with IV Rocephin and fluids.  -Subsequently antibiotics have been discontinued. -Sepsis physiology has resolved and she is hemodynamically stable -Chest x-ray was negative for infection.  -Currently hemodynamically stable  Acute Metabolic Encephalopathy Possible Wernicke's Encephalopathy CT of the head showed mild atrophic changes without acute intracranial abnormality -MRI of the brain: Symmetric diffusion weighted and T2/FLAIR hyperintense signal abnormality within the medial thalami. Findings favor sequelae of Wernicke's encephalopathy -There was question of patient having alcohol abuse however alcohol level less than 10.  -Urine drug screen negative -Completed IV thiamine. Currently on oral thiamine now.  -Continue folic acid, multivitamin. -Outpatient follow-up with Neurology and consider Neuropsychiatry Evaluation -Continue with haloperidol lactate 5 mg IM every 6 hours as needed for Agitation and with lorazepam 1 mg IM every 6 hours as needed for extreme agitation not resolved by the Haldol -Continue with hydroxyzine 25 mg p.o. twice daily as needed for anxiety -Mental status has been fluctuating. Suspect underlying dementia with memory issues.   Essential hypertension -Amlodipine 5 mg po Daily discontinued due to soft BP -BP currently 127/83  History of Alcohol Abuse -Treated with CIWA protocol.  -Continue folate, thiamine and multivitamin.  -Continue with hydroxyzine as above -Currently not withdrawing   History of Tobacco Abuse -Continue nicotine patch 14 mg transdermal patch every 24 hours  Generalized Weakness and Deconditioning -PT/OT recommending SNF. Social worker following. -Patient does need supervision as she appears to have short-term memory impairment.   Normocytic Anemia -Patient's hemoglobin/hematocrit was 12.6/38.5 on last check (04/03/19) -Continue monitor and trend CBC intermittently and follow for signs and symptoms of bleeding  Chest tightness/shortness of breath and dizziness -Resolved, likely transient  -Was not orthostatic   Hyperglycemia -Mild at 101 on 2/23 but since then has  improved and was 96 on 04/03/2019  -Continue to Monitor and Trend  -Repeat BMP intermittently   Constipation -Started Bowel Regimen with Miralax 17 grams po BID and with Senna-Docusate 1 tab po BID  DVT prophylaxis: Lovenox 40 mg sq q24h Code Status: FULL CODE Family Communication: No family present at bedside  Disposition Plan: Patient has been admitted from home with acute metabolic encephalopathy and sepsis Derry to urinary tract infection.  Her mental status waxes and wanes therefore she is an unsafe to be discharged home alone.  She continues to require SNF placement however it is been difficult to place.  Currently ALF is reviewing the patient and required a TB skin testing as well as a Covid test which is Negative. ALF was interested in the patient but the patient was not accepted to their ALF.  She will remain in the hospital until a safe discharge disposition is obtained.   Consultants:   Neurology  Psychiatry    Procedures: None   Antimicrobials:  Anti-infectives (From admission, onward)   Start     Dose/Rate Route Frequency Ordered Stop   12/14/18 0100  cefTRIAXone (ROCEPHIN) 1 g in sodium chloride 0.9 % 100 mL IVPB  Status:  Discontinued     1 g 200 mL/hr over 30 Minutes Intravenous Every 24 hours 12/14/18 0010 12/17/18 0948     Subjective: Seen and examined at bedside and states that she is doing fine and she is awake.  Denies any chest pain, lightheadedness or dizziness.  States that she does not want to go to ALF anymore but it turns at the ALF has not accept the patient.  No other concerns or complaints at this time.  Objective: Vitals:   04/13/19 1256 04/13/19 1608 04/13/19 2141 04/14/19 0728  BP: 123/81 111/76 91/61 127/83  Pulse: 100 (!) 103 88 78  Resp: 19 19  18   Temp: 98.1 F (36.7 C) 98.3 F (36.8 C) 98.9 F (37.2 C) (!) 97.3 F (36.3 C)  TempSrc:   Oral   SpO2: 99% 100% 97% 98%  Weight:      Height:        Intake/Output Summary (Last 24 hours) at 04/14/2019 1156 Last data  filed at 04/14/2019 0900 Gross per 24 hour  Intake 240 ml  Output -  Net 240 ml   Filed Weights   12/15/18 0101 02/22/19 0359  Weight: 98.4 kg 67.1 kg   Examination: Physical Exam:  Constitutional:Well-developed African-American female currently no acute distress appears slightly agitated this morning Respiratory:  Clear to auscultation bilaterally no appreciable wheezing, rales, rhonchi.  Patient not tachypneic or using accessory muscles to breathe Cardiovascular:  Has a regular rate and rhythm.  No appreciable murmurs, rubs, gallops Abdomen:  Soft, nontender, slightly distended secondary body habitus.  Bowel sounds present Neurologic:Cranial nerves II through XII grossly intact no appreciable focal deficits Psychiatric:  Patient is awake and alert and she is slightly agitated this morning and refusing to go to ALF however she is not been accepted.  Data Reviewed: I have personally reviewed following labs and imaging studies  CBC: No results for input(s): WBC, NEUTROABS, HGB, HCT, MCV, PLT in the last 168 hours. Basic Metabolic Panel: No results for input(s): NA, K, CL, CO2, GLUCOSE, BUN, CREATININE, CALCIUM, MG, PHOS in the last 168 hours. GFR: Estimated Creatinine Clearance: 71.8 mL/min (by C-G formula based on SCr of 0.74 mg/dL). Liver Function Tests: No results for input(s): AST, ALT, ALKPHOS, BILITOT, PROT, ALBUMIN in the last 168  hours. No results for input(s): LIPASE, AMYLASE in the last 168 hours. No results for input(s): AMMONIA in the last 168 hours. Coagulation Profile: No results for input(s): INR, PROTIME in the last 168 hours. Cardiac Enzymes: No results for input(s): CKTOTAL, CKMB, CKMBINDEX, TROPONINI in the last 168 hours. BNP (last 3 results) No results for input(s): PROBNP in the last 8760 hours. HbA1C: No results for input(s): HGBA1C in the last 72 hours. CBG: No results for input(s): GLUCAP in the last 168 hours. Lipid Profile: No results for  input(s): CHOL, HDL, LDLCALC, TRIG, CHOLHDL, LDLDIRECT in the last 72 hours. Thyroid Function Tests: No results for input(s): TSH, T4TOTAL, FREET4, T3FREE, THYROIDAB in the last 72 hours. Anemia Panel: No results for input(s): VITAMINB12, FOLATE, FERRITIN, TIBC, IRON, RETICCTPCT in the last 72 hours. Sepsis Labs: No results for input(s): PROCALCITON, LATICACIDVEN in the last 168 hours.  Recent Results (from the past 240 hour(s))  SARS CORONAVIRUS 2 (TAT 6-24 HRS) Nasopharyngeal Nasopharyngeal Swab     Status: None   Collection Time: 04/12/19  9:02 AM   Specimen: Nasopharyngeal Swab  Result Value Ref Range Status   SARS Coronavirus 2 NEGATIVE NEGATIVE Final    Comment: (NOTE) SARS-CoV-2 target nucleic acids are NOT DETECTED. The SARS-CoV-2 RNA is generally detectable in upper and lower respiratory specimens during the acute phase of infection. Negative results do not preclude SARS-CoV-2 infection, do not rule out co-infections with other pathogens, and should not be used as the sole basis for treatment or other patient management decisions. Negative results must be combined with clinical observations, patient history, and epidemiological information. The expected result is Negative. Fact Sheet for Patients: SugarRoll.be Fact Sheet for Healthcare Providers: https://www.woods-mathews.com/ This test is not yet approved or cleared by the [place] FDA and  has been authorized for detection and/or diagnosis of SARS-CoV-2 by FDA under an Emergency Use Authorization (EUA). This EUA will remain  in effect (meaning this test can be used) for the duration of the COVID-19 declaration under Section 56 4(b)(1) of the Act, 21 U.S.C. section 360bbb-3(b)(1), unless the authorization is terminated or revoked sooner. Performed at [facility], [place] [address], [place], [place] [address]      RN Pressure Injury Documentation:     Estimated body  mass index is 23.18 kg/m as calculated from the following:   Height as of this encounter: 5\' 7"  (1.702 m).   Weight as of this encounter: 67.1 kg.  Malnutrition Type:  Nutrition Problem: Increased nutrient needs Etiology: acute illness   Malnutrition Characteristics:  Signs/Symptoms: estimated needs   Nutrition Interventions:  Interventions: Ensure Enlive (each supplement provides 350kcal and 20 grams of protein), MVI  Radiology Studies: No results found.  Scheduled Meds: . enoxaparin (LOVENOX) injection  40 mg Subcutaneous Q24H  . feeding supplement (ENSURE ENLIVE)  237 mL Oral BID BM  . folic acid  1 mg Oral Daily  . ketoconazole   Topical BID  . multivitamin with minerals  1 tablet Oral Daily  . nicotine  14 mg Transdermal Daily  . polyethylene glycol  17 g Oral BID  . senna-docusate  1 tablet Oral BID  . thiamine  100 mg Oral Daily   Continuous Infusions:   LOS: 122 days   Kerney Elbe, DO [facility] PAGER is on AMION  If 7PM-7AM, please contact night-coverage [email or web address]

## 2019-04-14 NOTE — TOC Progression Note (Signed)
Transition of Care [facility]) - Progression Note    Patient Details  Name: Marie Woods MRN: [id number] Date of Birth: 12/22/1955  [facility] CM/SW Contact  Mearl Latin, LCSW Phone Number: 04/14/2019, 11:18 AM  Clinical Narrative:    CSW received call from Mr. Manson Passey. He stated that after speaking with his lawyer, he is unable to financially accept the patient due to being concerned that he will not receive payment if patient's Medicaid is denied. TOC Director contacted Mr. Manson Passey and they will speak again this afternoon to see if the financial issues could be resolved. CSW notes patient does not want to go to the facility, however, she is not able to live with family and no longer has an apartment. Patient with dementia and confusion and cannot make her own decisions.    Expected Discharge Plan: Skilled Nursing Facility Barriers to Discharge: No SNF bed, SNF Pending payor source - LOG  Expected Discharge Plan and Services Expected Discharge Plan: Skilled Nursing Facility In-house Referral: Clinical Social Work Discharge Planning Services: NA Post Acute Care Choice: Skilled Nursing Facility Living arrangements for the past 2 months: Apartment                 DME Arranged: N/A DME Agency: NA                   Social Determinants of Health (SDOH) Interventions    Readmission Risk Interventions No flowsheet data found.

## 2019-04-15 NOTE — Progress Notes (Signed)
PROGRESS NOTE    EMMA-LEE ODDO  GTX:[id number] DOB: 03-03-1955 DOA: 12/13/2018 PCP: Marcine Matar, MD   Brief Narrative:  The patient is a 64 year old African-American female with a past medical history significant for but not limited to asthma, hypertension, alcohol abuse who originally presented on 12/13/2018 for confusion and altered mental status.  She was found to have a UTI with urine cultures showing multiple species.  MRI of the brain was done and was suggestive of possible Wernicke's encephalopathy. She was treated with thiamine.  She subsequently improved and PT OT recommended SNF placement.  She is unsafe to discharge home given her mental status fluctuating with underlying dementia and suspected difficulty with memory issues.  She will need to be discharged to skilled nursing facility for safe disposition however currently bed is unavailable per social worker and they are in the process of attempting to place the patient but disposition is very difficult. ALF reviewed her case however she was unable to be accepted due to question of payment if patient's Medicaid is denied. Patient does not have a place to go as her apartment had to be let go due to able to hold it any more so she will need to remain in the hospital until a safe discharge disposition is obtained.  Assessment & Plan:   Principal Problem:   Wernicke encephalopathy Active Problems:   History of alcohol use   TOBACCO ABUSE   Essential hypertension   AMS (altered mental status)   Confusion   Acute metabolic encephalopathy   Agitation   Acute lower UTI   Sepsis (HCC)   Sepsis secondary to UTI (HCC)   Normocytic anemia  Severe sepsis 2/2 UTI: Present on Admission UTI -Urine culture showed multiple species. Repeat culture showed less than 10,000 colonies.  -Was initially treated with IV Rocephin and fluids.  -Subsequently antibiotics have been discontinued. -Sepsis physiology has resolved and she is  hemodynamically stable -Chest x-ray was negative for infection. -Currently hemodynamically stable  Acute Metabolic Encephalopathy Possible Wernicke's Encephalopathy CT of the head showed mild atrophic changes without acute intracranial abnormality -MRI of the brain: Symmetric diffusion weighted and T2/FLAIR hyperintense signal abnormality within the medial thalami. Findings favor sequelae of Wernicke's encephalopathy -There was question of patient having alcohol abuse however alcohol level less than 10.  -Urine drug screen negative -Completed IV thiamine. Currently on oral thiamine now.  -Continue folic acid, multivitamin. -Outpatient follow-up with Neurology and consider Neuropsychiatry Evaluation -Continue with haloperidol lactate 5 mg IM every 6 hours as needed for Agitation and with lorazepam 1 mg IM every 6 hours as needed for extreme agitation not resolved by the Haldol -Continue with hydroxyzine 25 mg p.o. twice daily as needed for anxiety -Mental status has been fluctuating. Suspect underlying dementia with memory issues.   Essential hypertension -Amlodipine 5 mg po Daily discontinued due to soft BP -BP currently 102/70  History of Alcohol Abuse -Treated with CIWA protocol.  -Continue folate, thiamine and multivitamin.  -Continue with hydroxyzine as above -Currently not withdrawing   History of Tobacco Abuse -Continue nicotine patch 14 mg transdermal patch every 24 hours  Generalized Weakness and Deconditioning -PT/OT recommending SNF. Social worker following. -Patient does need supervision as she appears to have short-term memory impairment.   Normocytic Anemia -Patient's hemoglobin/hematocrit was 12.6/38.5 on last check (04/03/19) -Continue monitor and trend CBC intermittently and follow for signs and symptoms of bleeding  Chest tightness/shortness of breath and dizziness -Resolved, likely transient  -Was not orthostatic  Hyperglycemia -Mild at 101  on 2/23 but since then has improved and was 96 on 04/03/2019 -Continue to Monitor and Trend  -Repeat BMP intermittently   Constipation -Started Bowel Regimen with Miralax 17 grams po BID and with Senna-Docusate 1 tab po BID  DVT prophylaxis: Lovenox 40 mg sq q24h Code Status: FULL CODE Family Communication: No family present at bedside  Disposition Plan: Patient has been admitted from home with acute metabolic encephalopathy and sepsis secondary to urinary tract infection.  Her mental status waxes and wanes therefore she is an unsafe to be discharged home alone.  She continues to require SNF placement however it is been difficult to place.  Currently ALF is reviewing the patient and required a TB skin testing as well as a Covid test which is Negative. ALF was interested in the patient but the patient was not accepted to their ALF due to question of payment if the pateint's Medicaid is denied.  She will remain in the hospital until a safe discharge disposition is obtained.   Consultants:   Neurology  Psychiatry    Procedures: None   Antimicrobials:  Anti-infectives (From admission, onward)   Start     Dose/Rate Route Frequency Ordered Stop   12/14/18 0100  cefTRIAXone (ROCEPHIN) 1 g in sodium chloride 0.9 % 100 mL IVPB  Status:  Discontinued     1 g 200 mL/hr over 30 Minutes Intravenous Every 24 hours 12/14/18 0010 12/17/18 0948     Subjective: Seen and examined at bedside and she had no complaints except that she had extremely dry legs.  States the lotion she is using is not working.  No nausea or vomiting.  No other concerns or complaints at this time.  Objective: Vitals:   04/13/19 1608 04/13/19 2141 04/14/19 0728 04/14/19 2301  BP: 111/76 91/61 127/83 102/70  Pulse: (!) 103 88 78 83  Resp: 19  18 18   Temp: 98.3 F (36.8 C) 98.9 F (37.2 C) (!) 97.3 F (36.3 C) 97.6 F (36.4 C)  TempSrc:  Oral    SpO2: 100% 97% 98% 100%  Weight:      Height:        Intake/Output  Summary (Last 24 hours) at 04/15/2019 0801 Last data filed at 04/14/2019 0900 Gross per 24 hour  Intake 240 ml  Output --  Net 240 ml   Filed Weights   12/15/18 0101 02/22/19 0359  Weight: 98.4 kg 67.1 kg   Examination: Physical Exam:  Constitutional:Well-nourished, well-developed African-American female currently no acute distress sitting up on the edge of the bed Respiratory:  Clear to auscultation bilaterally with no appreciable wheezing, rales, rhonchi.  Patient is not tachypneic or using any accessory muscles to breathe Cardiovascular:  Regular rate and rhythm.  No appreciable murmurs, rubs or gallops Abdomen:  Soft, nontender, distended secondary to body habitus.  Bowel sounds present Neurologic:Cranial nerves II through XII grossly intact no appreciable focal deficits Psychiatric:  She is awake and alert and she is slightly agitated with the lotion that she has been using.  Data Reviewed: I have personally reviewed following labs and imaging studies  CBC: No results for input(s): WBC, NEUTROABS, HGB, HCT, MCV, PLT in the last 168 hours. Basic Metabolic Panel: No results for input(s): NA, K, CL, CO2, GLUCOSE, BUN, CREATININE, CALCIUM, MG, PHOS in the last 168 hours. GFR: Estimated Creatinine Clearance: 71.8 mL/min (by C-G formula based on SCr of 0.74 mg/dL). Liver Function Tests: No results for input(s): AST, ALT, ALKPHOS, BILITOT,  PROT, ALBUMIN in the last 168 hours. No results for input(s): LIPASE, AMYLASE in the last 168 hours. No results for input(s): AMMONIA in the last 168 hours. Coagulation Profile: No results for input(s): INR, PROTIME in the last 168 hours. Cardiac Enzymes: No results for input(s): CKTOTAL, CKMB, CKMBINDEX, TROPONINI in the last 168 hours. BNP (last 3 results) No results for input(s): PROBNP in the last 8760 hours. HbA1C: No results for input(s): HGBA1C in the last 72 hours. CBG: No results for input(s): GLUCAP in the last 168 hours. Lipid  Profile: No results for input(s): CHOL, HDL, LDLCALC, TRIG, CHOLHDL, LDLDIRECT in the last 72 hours. Thyroid Function Tests: No results for input(s): TSH, T4TOTAL, FREET4, T3FREE, THYROIDAB in the last 72 hours. Anemia Panel: No results for input(s): VITAMINB12, FOLATE, FERRITIN, TIBC, IRON, RETICCTPCT in the last 72 hours. Sepsis Labs: No results for input(s): PROCALCITON, LATICACIDVEN in the last 168 hours.  Recent Results (from the past 240 hour(s))  SARS CORONAVIRUS 2 (TAT 6-24 HRS) Nasopharyngeal Nasopharyngeal Swab     Status: None   Collection Time: 04/12/19  9:02 AM   Specimen: Nasopharyngeal Swab  Result Value Ref Range Status   SARS Coronavirus 2 NEGATIVE NEGATIVE Final    Comment: (NOTE) SARS-CoV-2 target nucleic acids are NOT DETECTED. The SARS-CoV-2 RNA is generally detectable in upper and lower respiratory specimens during the acute phase of infection. Negative results do not preclude SARS-CoV-2 infection, do not rule out co-infections with other pathogens, and should not be used as the sole basis for treatment or other patient management decisions. Negative results must be combined with clinical observations, patient history, and epidemiological information. The expected result is Negative. Fact Sheet for Patients: SugarRoll.be Fact Sheet for Healthcare Providers: https://www.woods-mathews.com/ This test is not yet approved or cleared by the [place] FDA and  has been authorized for detection and/or diagnosis of SARS-CoV-2 by FDA under an Emergency Use Authorization (EUA). This EUA will remain  in effect (meaning this test can be used) for the duration of the COVID-19 declaration under Section 56 4(b)(1) of the Act, 21 U.S.C. section 360bbb-3(b)(1), unless the authorization is terminated or revoked sooner. Performed at [facility], [place] [address], [place], [place] [address]      RN Pressure Injury  Documentation:     Estimated body mass index is 23.18 kg/m as calculated from the following:   Height as of this encounter: 5\' 7"  (1.702 m).   Weight as of this encounter: 67.1 kg.  Malnutrition Type:  Nutrition Problem: Increased nutrient needs Etiology: acute illness   Malnutrition Characteristics:  Signs/Symptoms: estimated needs   Nutrition Interventions:  Interventions: Ensure Enlive (each supplement provides 350kcal and 20 grams of protein), MVI  Radiology Studies: No results found.  Scheduled Meds: . enoxaparin (LOVENOX) injection  40 mg Subcutaneous Q24H  . feeding supplement (ENSURE ENLIVE)  237 mL Oral BID BM  . folic acid  1 mg Oral Daily  . ketoconazole   Topical BID  . multivitamin with minerals  1 tablet Oral Daily  . nicotine  14 mg Transdermal Daily  . polyethylene glycol  17 g Oral BID  . senna-docusate  1 tablet Oral BID  . thiamine  100 mg Oral Daily   Continuous Infusions:   LOS: 123 days   Kerney Elbe, DO [facility] PAGER is on AMION  If 7PM-7AM, please contact night-coverage [email or web address]

## 2019-04-16 DIAGNOSIS — L853 Xerosis cutis: Secondary | ICD-10-CM

## 2019-04-16 DIAGNOSIS — L309 Dermatitis, unspecified: Secondary | ICD-10-CM

## 2019-04-16 MED ORDER — DIPHENHYDRAMINE HCL 50 MG/ML IJ SOLN
25.0000 mg | Freq: Once | INTRAMUSCULAR | Status: DC
  Filled 2019-04-16: qty 1

## 2019-04-16 MED ORDER — HYDROCORTISONE 1 % EX LOTN: TOPICAL_LOTION | Freq: Two times a day (BID) | CUTANEOUS | Status: DC

## 2019-04-16 MED ORDER — AVEENO SOOTHING BATH TREATMENT EX PACK
1.0000 | PACK | Freq: Every day | CUTANEOUS | Status: DC
  Administered 2019-04-19 – 2019-05-18 (×14): 1 via TOPICAL
  Filled 2019-04-16 (×35): qty 1

## 2019-04-16 MED ORDER — LORATADINE 10 MG PO TABS
10.0000 mg | ORAL_TABLET | Freq: Every day | ORAL | Status: DC
  Administered 2019-04-17 – 2019-05-18 (×31): 10 mg via ORAL
  Filled 2019-04-16 (×33): qty 1

## 2019-04-16 MED ORDER — HYDROCORTISONE 1 % EX CREA
TOPICAL_CREAM | Freq: Two times a day (BID) | CUTANEOUS | Status: DC
  Filled 2019-04-16: qty 28

## 2019-04-16 MED ORDER — TRIAMCINOLONE 0.1 % CREAM:EUCERIN CREAM 1:1
TOPICAL_CREAM | Freq: Two times a day (BID) | CUTANEOUS | Status: DC
  Administered 2019-04-17 – 2019-05-09 (×6): 1 via TOPICAL
  Filled 2019-04-16: qty 1

## 2019-04-16 MED ORDER — AVEENO MOISTURIZING EX BAR: CHEWABLE_BAR | Freq: Every day | CUTANEOUS | Status: DC

## 2019-04-16 NOTE — Progress Notes (Signed)
PROGRESS NOTE    Marie Woods  QMV:[id number] DOB: 08/26/1955 DOA: 12/13/2018 PCP: Marcine Matar, MD   Brief Narrative:  The patient is a 64 year old African-American female with a past medical history significant for but not limited to asthma, hypertension, alcohol abuse who originally presented on 12/13/2018 for confusion and altered mental status.  She was found to have a UTI with urine cultures showing multiple species.  MRI of the brain was done and was suggestive of possible Wernicke's encephalopathy. She was treated with thiamine.  She subsequently improved and PT OT recommended SNF placement.  She is unsafe to discharge home given her mental status fluctuating with underlying dementia and suspected difficulty with memory issues.  She will need to be discharged to skilled nursing facility for safe disposition however currently bed is unavailable per social worker and they are in the process of attempting to place the patient but disposition is very difficult. ALF reviewed her case however she was unable to be accepted due to question of payment if patient's Medicaid is denied. Patient does not have a place to go as her apartment had to be let go due to able to hold it any more so she will need to remain in the hospital until a safe discharge disposition is obtained.  Assessment & Plan:   Principal Problem:   Wernicke encephalopathy Active Problems:   History of alcohol use   TOBACCO ABUSE   Essential hypertension   AMS (altered mental status)   Confusion   Acute metabolic encephalopathy   Agitation   Acute lower UTI   Sepsis (HCC)   Sepsis secondary to UTI (HCC)   Normocytic anemia  Severe sepsis 2/2 UTI: Present on Admission UTI -Urine culture showed multiple species. Repeat culture showed less than 10,000 colonies.  -Was initially treated with IV Rocephin and fluids.  -Subsequently antibiotics have been discontinued. -Sepsis physiology has resolved and she is  hemodynamically stable -Chest x-ray was negative for infection. -Currently hemodynamically stable  Acute Metabolic Encephalopathy Possible Wernicke's Encephalopathy CT of the head showed mild atrophic changes without acute intracranial abnormality -MRI of the brain: Symmetric diffusion weighted and T2/FLAIR hyperintense signal abnormality within the medial thalami. Findings favor sequelae of Wernicke's encephalopathy -There was question of patient having alcohol abuse however alcohol level less than 10.  -Urine drug screen negative -Completed IV thiamine. Currently on oral thiamine now.  -Continue folic acid, multivitamin. -Outpatient follow-up with Neurology and consider Neuropsychiatry Evaluation -Continue with haloperidol lactate 5 mg IM every 6 hours as needed for Agitation and with lorazepam 1 mg IM every 6 hours as needed for extreme agitation not resolved by the Haldol -Continue with hydroxyzine 25 mg p.o. twice daily as needed for anxiety -Mental status has been fluctuating. Suspect underlying dementia with memory issues.   Essential hypertension -Amlodipine 5 mg po Daily discontinued due to soft BP -BP currently 98/57  History of Alcohol Abuse -Treated with CIWA protocol.  -Continue folate, thiamine and multivitamin.  -Continue with hydroxyzine as above -Currently not withdrawing   History of Tobacco Abuse -Continue nicotine patch 14 mg Transdermal patch every 24 hours  Generalized Weakness and Deconditioning -PT/OT recommending SNF. Social worker following. -Patient does need supervision as she appears to have short-term memory impairment.   Normocytic Anemia -Patient's hemoglobin/hematocrit was 12.6/38.5 on last check (04/03/19) -Continue monitor and trend CBC intermittently and follow for signs and symptoms of bleeding  Chest tightness/shortness of breath and dizziness -Resolved, likely transient  -Was not orthostatic  Hyperglycemia -Mild at 101 on  2/23 but since then has improved and was 96 on 04/03/2019 -Continue to Monitor and Trend  -Repeat BMP intermittently but will check CMP today given worsening itching and dry skin to look at Bilirubin Level    Constipation -Started Bowel Regimen with Miralax 17 grams po BID and with Senna-Docusate 1 tab po BID  Xerosis/Eczema -Has extremely dry and flaky skin and she is complaining of itching significantly no evidence of any redness on my evaluation so will not give any Clobetasol 0.5% cream twice daily -Given IV Benadryl 25 mg x 1 and was scheduling her Claritin 10 mg p.o. daily -We will try topical solutions and have started triamcinolone 1% and Eucerin cream 1:1 mixture -Also have ordered Aveeno soothing bath treatments -Continue with mineral oil-hydrophilic petrolatum topically twice daily as needed for dry skin as well as Hydroscerin cream 2 Applications Topically as Needed fro Dry Skin/Relief/Moisturize and c/w Ketoconazole 2% Cream -Check CMP and CBC given itching to further evaluate    DVT prophylaxis: Lovenox 40 mg sq q24h Code Status: FULL CODE Family Communication: No family present at bedside  Disposition Plan: Patient has been admitted from home with acute metabolic encephalopathy and sepsis secondary to urinary tract infection.  Her mental status waxes and wanes therefore she is an unsafe to be discharged home alone.  She continues to require SNF placement however it is been difficult to place.  Currently ALF is reviewing the patient and required a TB skin testing as well as a Covid test which is Negative. ALF was interested in the patient but the patient was not accepted to their ALF due to question of payment if the pateint's Medicaid is denied.  She will remain in the hospital until a safe discharge disposition is obtained.  Consultants:   Neurology  Psychiatry    Procedures: None   Antimicrobials:  Anti-infectives (From admission, onward)   Start     Dose/Rate Route  Frequency Ordered Stop   12/14/18 0100  cefTRIAXone (ROCEPHIN) 1 g in sodium chloride 0.9 % 100 mL IVPB  Status:  Discontinued     1 g 200 mL/hr over 30 Minutes Intravenous Every 24 hours 12/14/18 0010 12/17/18 0948     Subjective: Seen and examined at bedside and she was having significant amount of itching and complaints of dry skin.  States her "skin is peeling off".  No nausea or vomiting.  States that she is very itchy and wanted to take a shower immediately.  No chest pain, shortness of breath.  No other concerns or complaints at this time.  Objective: Vitals:   04/14/19 2301 04/15/19 1756 04/15/19 2240 04/16/19 0841  BP: 102/70 116/89 (!) 95/53 (!) 98/57  Pulse: 83 92 82 73  Resp: 18 18 18 16   Temp: 97.6 F (36.4 C) 97.6 F (36.4 C) 98.8 F (37.1 C) 98 F (36.7 C)  TempSrc:   Oral   SpO2: 100% 99% 98% 96%  Weight:      Height:       No intake or output data in the 24 hours ending 04/16/19 1151 Filed Weights   12/15/18 0101 02/22/19 0359  Weight: 98.4 kg 67.1 kg   Examination: Physical Exam:  Constitutional: WN/WD African-American female currently in mild distress complaining of whole body itching and appears anxious and slightly uncomfortable Eyes: Lids and conjunctivae normal, sclerae anicteric  ENMT: External Ears, Nose appear normal. Grossly normal hearing.  Neck: Appears normal, supple, no cervical masses, normal ROM,  no appreciable thyromegaly; no JVD Respiratory: Slightly diminished to auscultation bilaterally, no wheezing, rales, rhonchi or crackles. Normal respiratory effort and patient is not tachypenic. No accessory muscle use.  Unlabored breathing Cardiovascular: RRR, no murmurs / rubs / gallops. S1 and S2 auscultated.  Abdomen: Soft, non-tender, non-distended. Bowel sounds positive.  GU: Deferred. Musculoskeletal: No clubbing / cyanosis of digits/nails. No joint deformity upper and lower extremities. G Skin: Has extremely dry skin which she has been  scratching.  She is flaking of her skin as well consistent with xerosis.  No active erythema or warmth. Neurologic: CN 2-12 grossly intact with no focal deficits. Romberg sign and cerebellar reflexes not assessed.  Psychiatric: Normal judgment and insight. Alert and awake. Anxious mood and appropriate affect.   Data Reviewed: I have personally reviewed following labs and imaging studies  CBC: No results for input(s): WBC, NEUTROABS, HGB, HCT, MCV, PLT in the last 168 hours. Basic Metabolic Panel: No results for input(s): NA, K, CL, CO2, GLUCOSE, BUN, CREATININE, CALCIUM, MG, PHOS in the last 168 hours. GFR: Estimated Creatinine Clearance: 71.8 mL/min (by C-G formula based on SCr of 0.74 mg/dL). Liver Function Tests: No results for input(s): AST, ALT, ALKPHOS, BILITOT, PROT, ALBUMIN in the last 168 hours. No results for input(s): LIPASE, AMYLASE in the last 168 hours. No results for input(s): AMMONIA in the last 168 hours. Coagulation Profile: No results for input(s): INR, PROTIME in the last 168 hours. Cardiac Enzymes: No results for input(s): CKTOTAL, CKMB, CKMBINDEX, TROPONINI in the last 168 hours. BNP (last 3 results) No results for input(s): PROBNP in the last 8760 hours. HbA1C: No results for input(s): HGBA1C in the last 72 hours. CBG: No results for input(s): GLUCAP in the last 168 hours. Lipid Profile: No results for input(s): CHOL, HDL, LDLCALC, TRIG, CHOLHDL, LDLDIRECT in the last 72 hours. Thyroid Function Tests: No results for input(s): TSH, T4TOTAL, FREET4, T3FREE, THYROIDAB in the last 72 hours. Anemia Panel: No results for input(s): VITAMINB12, FOLATE, FERRITIN, TIBC, IRON, RETICCTPCT in the last 72 hours. Sepsis Labs: No results for input(s): PROCALCITON, LATICACIDVEN in the last 168 hours.  Recent Results (from the past 240 hour(s))  SARS CORONAVIRUS 2 (TAT 6-24 HRS) Nasopharyngeal Nasopharyngeal Swab     Status: None   Collection Time: 04/12/19  9:02 AM    Specimen: Nasopharyngeal Swab  Result Value Ref Range Status   SARS Coronavirus 2 NEGATIVE NEGATIVE Final    Comment: (NOTE) SARS-CoV-2 target nucleic acids are NOT DETECTED. The SARS-CoV-2 RNA is generally detectable in upper and lower respiratory specimens during the acute phase of infection. Negative results do not preclude SARS-CoV-2 infection, do not rule out co-infections with other pathogens, and should not be used as the sole basis for treatment or other patient management decisions. Negative results must be combined with clinical observations, patient history, and epidemiological information. The expected result is Negative. Fact Sheet for Patients: HairSlick.no Fact Sheet for Healthcare Providers: quierodirigir.com This test is not yet approved or cleared by the [place] FDA and  has been authorized for detection and/or diagnosis of SARS-CoV-2 by FDA under an Emergency Use Authorization (EUA). This EUA will remain  in effect (meaning this test can be used) for the duration of the COVID-19 declaration under Section 56 4(b)(1) of the Act, 21 U.S.C. section 360bbb-3(b)(1), unless the authorization is terminated or revoked sooner. Performed at [facility], [address] [place], [place] [address]      RN Pressure Injury Documentation:  Estimated body mass index is 23.18 kg/m as calculated from the following:   Height as of this encounter: 5\' 7"  (1.702 m).   Weight as of this encounter: 67.1 kg.  Malnutrition Type:  Nutrition Problem: Increased nutrient needs Etiology: acute illness   Malnutrition Characteristics:  Signs/Symptoms: estimated needs   Nutrition Interventions:  Interventions: Ensure Enlive (each supplement provides 350kcal and 20 grams of protein), MVI  Radiology Studies: No results found.  Scheduled Meds: . Aveeno Treatment  1 packet Topical Daily  .  diphenhydrAMINE  25 mg Intravenous Once  . enoxaparin (LOVENOX) injection  40 mg Subcutaneous Q24H  . feeding supplement (ENSURE ENLIVE)  237 mL Oral BID BM  . folic acid  1 mg Oral Daily  . ketoconazole   Topical BID  . loratadine  10 mg Oral Daily  . multivitamin with minerals  1 tablet Oral Daily  . nicotine  14 mg Transdermal Daily  . polyethylene glycol  17 g Oral BID  . senna-docusate  1 tablet Oral BID  . thiamine  100 mg Oral Daily  . triamcinolone 0.1 % cream : eucerin   Topical BID   Continuous Infusions:   LOS: 124 days   [employer], DO [facility] PAGER is on AMION  If 7PM-7AM, please contact night-coverage [email or web address]

## 2019-04-17 NOTE — Progress Notes (Signed)
PROGRESS NOTE    Marie Woods  LGX:[id number] DOB: 12-Feb-1955 DOA: 12/13/2018 PCP: Marcine Matar, MD   Brief Narrative:  The patient is a 64 year old African-American female with a past medical history significant for but not limited to asthma, hypertension, alcohol abuse who originally presented on 12/13/2018 for confusion and altered mental status.  She was found to have a UTI with urine cultures showing multiple species.  MRI of the brain was done and was suggestive of possible Wernicke's encephalopathy. She was treated with thiamine.  She subsequently improved and PT OT recommended SNF placement.  She is unsafe to discharge home given her mental status fluctuating with underlying dementia and suspected difficulty with memory issues.  She will need to be discharged to skilled nursing facility for safe disposition however currently bed is unavailable per social worker and they are in the process of attempting to place the patient but disposition is very difficult. ALF reviewed her case however she was unable to be accepted due to question of payment if patient's Medicaid is denied. Patient does not have a place to go as her apartment had to be let go due to able to hold it any more so she will need to remain in the hospital until a safe discharge disposition is obtained.  Assessment & Plan:   Principal Problem:   Wernicke encephalopathy Active Problems:   History of alcohol use   TOBACCO ABUSE   Essential hypertension   AMS (altered mental status)   Confusion   Acute metabolic encephalopathy   Agitation   Acute lower UTI   Sepsis (HCC)   Sepsis secondary to UTI (HCC)   Normocytic anemia  Severe sepsis 2/2 UTI: Present on Admission UTI -Urine culture showed multiple species. Repeat culture showed less than 10,000 colonies.  -Was initially treated with IV Rocephin and fluids.  -Subsequently antibiotics have been discontinued. -Sepsis physiology has resolved and she is  hemodynamically stable -Chest x-ray was negative for infection. -Currently hemodynamically stable  Acute Metabolic Encephalopathy Possible Wernicke's Encephalopathy CT of the head showed mild atrophic changes without acute intracranial abnormality -MRI of the brain: Symmetric diffusion weighted and T2/FLAIR hyperintense signal abnormality within the medial thalami. Findings favor sequelae of Wernicke's encephalopathy -There was question of patient having alcohol abuse however alcohol level less than 10.  -Urine drug screen negative -Completed IV thiamine. Currently on oral thiamine now.  -Continue folic acid, multivitamin. -Outpatient follow-up with Neurology and consider Neuropsychiatry Evaluation -Continue with haloperidol lactate 5 mg IM every 6 hours as needed for Agitation and with lorazepam 1 mg IM every 6 hours as needed for extreme agitation not resolved by the Haldol -Continue with hydroxyzine 25 mg p.o. twice daily as needed for anxiety -Mental status has been fluctuating. Suspect underlying dementia with memory issues.   Essential hypertension -Amlodipine 5 mg po Daily discontinued due to soft BP -BP currently 116/81  History of Alcohol Abuse -Treated with CIWA protocol.  -Continue folate, thiamine and multivitamin.  -Continue with hydroxyzine as above -Currently not withdrawing   History of Tobacco Abuse -Continue nicotine patch 14 mg Transdermal patch every 24 hours  Generalized Weakness and Deconditioning -PT/OT recommending SNF. Social worker following. -Patient does need supervision as she appears to have short-term memory impairment.   Normocytic Anemia -Patient's hemoglobin/hematocrit was 12.6/38.5 on last check (04/03/19) -Continue monitor and trend CBC intermittently and follow for signs and symptoms of bleeding  Chest tightness/shortness of breath and dizziness -Resolved, likely transient  -Was not orthostatic  Hyperglycemia -Mild at 101  on 2/23 but since then has improved and was 96 on 04/03/2019 -Continue to Monitor and Trend  -Repeat BMP intermittently  -CMP never checked yesterday  Constipation -Started Bowel Regimen with Miralax 17 grams po BID and with Senna-Docusate 1 tab po BID  Xerosis/Eczema -Has extremely dry and flaky skin and she is complaining of itching significantly no evidence of any redness on my evaluation so will not give any Clobetasol 0.5% cream twice daily -Given IV Benadryl 25 mg x 1 and was scheduling her Claritin 10 mg p.o. daily -We will try topical solutions and have started triamcinolone 1% and Eucerin cream 1:1 mixture -Also have ordered Aveeno soothing bath treatments -Continue with mineral oil-hydrophilic petrolatum topically twice daily as needed for dry skin as well as Hydroscerin cream 2 Applications Topically as Needed fro Dry Skin/Relief/Moisturize and c/w Ketoconazole 2% Cream -Check CMP and CBC given itching to further evaluate but never checked yesterday as patient refused  DVT prophylaxis: Lovenox 40 mg sq q24h Code Status: FULL CODE Family Communication: No family present at bedside  Disposition Plan: Patient has been admitted from home with acute metabolic encephalopathy and sepsis secondary to urinary tract infection.  Her mental status waxes and wanes therefore she is an unsafe to be discharged home alone.  She continues to require SNF placement however it is been difficult to place.  Currently ALF is reviewing the patient and required a TB skin testing as well as a Covid test which is Negative. ALF was interested in the patient but the patient was not accepted to their ALF due to question of payment if the pateint's Medicaid is denied.  She will remain in the hospital until a safe discharge disposition is obtained.  Consultants:   Neurology  Psychiatry    Procedures: None   Antimicrobials:  Anti-infectives (From admission, onward)   Start     Dose/Rate Route Frequency  Ordered Stop   12/14/18 0100  cefTRIAXone (ROCEPHIN) 1 g in sodium chloride 0.9 % 100 mL IVPB  Status:  Discontinued     1 g 200 mL/hr over 30 Minutes Intravenous Every 24 hours 12/14/18 0010 12/17/18 0948     Subjective: Seen and examined at bedside and was more comfortable today.  States that she is still having some itching but not as bad.  Feels okay and states that she slept good.  Nursing states that she has been more calm and cooperative today.  No other concerns or complaints at this time.  Objective: Vitals:   04/15/19 2240 04/16/19 0841 04/16/19 1718 04/16/19 2226  BP: (!) 95/53 (!) 98/57 125/72 116/81  Pulse: 82 73 83 74  Resp: 18 16 15 16   Temp: 98.8 F (37.1 C) 98 F (36.7 C) 98.3 F (36.8 C) 98 F (36.7 C)  TempSrc: Oral  Oral Oral  SpO2: 98% 96% 90% 100%  Weight:      Height:        Intake/Output Summary (Last 24 hours) at 04/17/2019 0750 Last data filed at 04/16/2019 0850 Gross per 24 hour  Intake 240 ml  Output -  Net 240 ml   Filed Weights   12/15/18 0101 02/22/19 0359  Weight: 98.4 kg 67.1 kg   Examination: Physical Exam:  Constitutional:Well-nourished, well-developed African-American female currently no acute distress and appears a bit calmer today and not complaining of as much itching Respiratory:Slightly diminished to auscultation bilaterally no appreciable wheezing, rales, rhonchi.  Patient is not tachypneic or using accessory muscle breathing  Cardiovascular:Regular rate and rhythm.  No appreciable murmurs, rubs, gallops.  Abdomen:Soft, nontender, nondistended.  Bowel sounds present Neurologic:Cranial nerves II through XII grossly intact no appreciable focal deficits Psychiatric:Appears calm and not as anxious as she was yesterday.  She is awake and alert  Data Reviewed: I have personally reviewed following labs and imaging studies  CBC: No results for input(s): WBC, NEUTROABS, HGB, HCT, MCV, PLT in the last 168 hours. Basic Metabolic  Panel: No results for input(s): NA, K, CL, CO2, GLUCOSE, BUN, CREATININE, CALCIUM, MG, PHOS in the last 168 hours. GFR: Estimated Creatinine Clearance: 71.8 mL/min (by C-G formula based on SCr of 0.74 mg/dL). Liver Function Tests: No results for input(s): AST, ALT, ALKPHOS, BILITOT, PROT, ALBUMIN in the last 168 hours. No results for input(s): LIPASE, AMYLASE in the last 168 hours. No results for input(s): AMMONIA in the last 168 hours. Coagulation Profile: No results for input(s): INR, PROTIME in the last 168 hours. Cardiac Enzymes: No results for input(s): CKTOTAL, CKMB, CKMBINDEX, TROPONINI in the last 168 hours. BNP (last 3 results) No results for input(s): PROBNP in the last 8760 hours. HbA1C: No results for input(s): HGBA1C in the last 72 hours. CBG: No results for input(s): GLUCAP in the last 168 hours. Lipid Profile: No results for input(s): CHOL, HDL, LDLCALC, TRIG, CHOLHDL, LDLDIRECT in the last 72 hours. Thyroid Function Tests: No results for input(s): TSH, T4TOTAL, FREET4, T3FREE, THYROIDAB in the last 72 hours. Anemia Panel: No results for input(s): VITAMINB12, FOLATE, FERRITIN, TIBC, IRON, RETICCTPCT in the last 72 hours. Sepsis Labs: No results for input(s): PROCALCITON, LATICACIDVEN in the last 168 hours.  Recent Results (from the past 240 hour(s))  SARS CORONAVIRUS 2 (TAT 6-24 HRS) Nasopharyngeal Nasopharyngeal Swab     Status: None   Collection Time: 04/12/19  9:02 AM   Specimen: Nasopharyngeal Swab  Result Value Ref Range Status   SARS Coronavirus 2 NEGATIVE NEGATIVE Final    Comment: (NOTE) SARS-CoV-2 target nucleic acids are NOT DETECTED. The SARS-CoV-2 RNA is generally detectable in upper and lower respiratory specimens during the acute phase of infection. Negative results do not preclude SARS-CoV-2 infection, do not rule out co-infections with other pathogens, and should not be used as the sole basis for treatment or other patient management decisions.  Negative results must be combined with clinical observations, patient history, and epidemiological information. The expected result is Negative. Fact Sheet for Patients: HairSlick.no Fact Sheet for Healthcare Providers: quierodirigir.com This test is not yet approved or cleared by the [place] FDA and  has been authorized for detection and/or diagnosis of SARS-CoV-2 by FDA under an Emergency Use Authorization (EUA). This EUA will remain  in effect (meaning this test can be used) for the duration of the COVID-19 declaration under Section 56 4(b)(1) of the Act, 21 U.S.C. section 360bbb-3(b)(1), unless the authorization is terminated or revoked sooner. Performed at [facility], [address], [place], [place] [address]      RN Pressure Injury Documentation:     Estimated body mass index is 23.18 kg/m as calculated from the following:   Height as of this encounter: 5\' 7"  (1.702 m).   Weight as of this encounter: 67.1 kg.  Malnutrition Type:  Nutrition Problem: Increased nutrient needs Etiology: acute illness   Malnutrition Characteristics:  Signs/Symptoms: estimated needs   Nutrition Interventions:  Interventions: Ensure Enlive (each supplement provides 350kcal and 20 grams of protein), MVI  Radiology Studies: No results found.  Scheduled Meds: . Aveeno Soothing  Bath Treatment  1 packet Topical Daily  . diphenhydrAMINE  25 mg Intravenous Once  . enoxaparin (LOVENOX) injection  40 mg Subcutaneous Q24H  . feeding supplement (ENSURE ENLIVE)  237 mL Oral BID BM  . folic acid  1 mg Oral Daily  . ketoconazole   Topical BID  . loratadine  10 mg Oral Daily  . multivitamin with minerals  1 tablet Oral Daily  . nicotine  14 mg Transdermal Daily  . polyethylene glycol  17 g Oral BID  . senna-docusate  1 tablet Oral BID  . thiamine  100 mg Oral Daily  . triamcinolone 0.1 % cream : eucerin   Topical BID    Continuous Infusions:   LOS: 125 days   Kerney Elbe, DO [facility] PAGER is on AMION  If 7PM-7AM, please contact night-coverage [email or web address]

## 2019-04-18 NOTE — Progress Notes (Signed)
PROGRESS NOTE    Marie Woods  FIE:[id number] DOB: 1955-07-01 DOA: 12/13/2018 PCP: Marcine Matar, MD   Brief Narrative:  The patient is a 64 year old African-American female with a past medical history significant for but not limited to asthma, hypertension, alcohol abuse who originally presented on 12/13/2018 for confusion and altered mental status.  She was found to have a UTI with urine cultures showing multiple species.  MRI of the brain was done and was suggestive of possible Wernicke's encephalopathy. She was treated with thiamine.  She subsequently improved and PT OT recommended SNF placement.  She is unsafe to discharge home given her mental status fluctuating with underlying dementia and suspected difficulty with memory issues.  She will need to be discharged to skilled nursing facility for safe disposition however currently bed is unavailable per social worker and they are in the process of attempting to place the patient but disposition is very difficult. ALF reviewed her case however she was unable to be accepted due to payment issue. Patient does not have a place to go as her apartment had to be let go due to able to hold it any more so she will need to remain in the hospital until a safe discharge disposition is obtained.  Assessment & Plan:   Principal Problem:   Wernicke encephalopathy Active Problems:   History of alcohol use   TOBACCO ABUSE   Essential hypertension   AMS (altered mental status)   Confusion   Acute metabolic encephalopathy   Agitation   Acute lower UTI   Sepsis (HCC)   Sepsis secondary to UTI (HCC)   Normocytic anemia  Severe sepsis 2/2 UTI: Present on Admission UTI -Urine culture showed multiple species. Repeat culture showed less than 10,000 colonies.  -Was initially treated with IV Rocephin and fluids.  -Subsequently antibiotics have been discontinued. -Sepsis physiology has resolved and she is hemodynamically stable -Chest x-ray was  negative for infection. -Currently hemodynamically stable  Acute Metabolic Encephalopathy Possible Wernicke's Encephalopathy CT of the head showed mild atrophic changes without acute intracranial abnormality -MRI of the brain: Symmetric diffusion weighted and T2/FLAIR hyperintense signal abnormality within the medial thalami. Findings favor sequelae of Wernicke's encephalopathy -There was question of patient having alcohol abuse however alcohol level less than 10.  -Urine drug screen negative -Completed IV thiamine. Currently on oral thiamine now.  -Continue folic acid, multivitamin. -Outpatient follow-up with Neurology and consider Neuropsychiatry Evaluation -Continue with haloperidol lactate 5 mg IM every 6 hours as needed for Agitation and with lorazepam 1 mg IM every 6 hours as needed for extreme agitation not resolved by the Haldol -Continue with hydroxyzine 25 mg p.o. twice daily as needed for anxiety -Mental status has been fluctuating. Suspect underlying dementia with memory issues.   Essential hypertension -Amlodipine 5 mg po Daily discontinued due to soft BP -BP currently 104/67  History of Alcohol Abuse -Treated with CIWA protocol.  -Continue folate, thiamine and multivitamin.  -Continue with hydroxyzine as above -Currently not withdrawing   History of Tobacco Abuse -Continue nicotine patch 14 mg Transdermal patch every 24 hours  Generalized Weakness and Deconditioning -PT/OT recommending SNF. Social worker following. -Patient does need supervision as she appears to have short-term memory impairment.   Normocytic Anemia -Patient's hemoglobin/hematocrit was 12.6/38.5 on last check (04/03/19) -Continue monitor and trend CBC intermittently and follow for signs and symptoms of bleeding  Chest tightness/shortness of breath and dizziness -Resolved, likely transient  -Was not orthostatic   Hyperglycemia -Mild at 101 on 2/23  but since then has improved and was  96 on 04/03/2019 -Continue to Monitor and Trend  -Repeat BMP intermittently  -CMP never checked yesterday  Constipation -Started Bowel Regimen with Miralax 17 grams po BID and with Senna-Docusate 1 tab po BID  Xerosis/Eczema -Has had extremely dry and flaky skin and she is complaining of itching significantly no evidence of any redness on my evaluation so will not give any Clobetasol 0.5% cream twice daily; Dry skin is improving  -Given IV Benadryl 25 mg x 1 and was scheduling her Claritin 10 mg p.o. daily -We will try topical solutions and have started triamcinolone 1% and Eucerin cream 1:1 mixture -Also have ordered Aveeno soothing bath treatments -Continue with mineral oil-hydrophilic petrolatum topically twice daily as needed for dry skin as well as Hydroscerin cream 2 Applications Topically as Needed fro Dry Skin/Relief/Moisturize and c/w Ketoconazole 2% Cream -Check CMP and CBC given itching to further evaluate but never checked yesterday as patient refused  DVT prophylaxis: Lovenox 40 mg sq q24h Code Status: FULL CODE Family Communication: No family present at bedside  Disposition Plan: Patient has been admitted from home with acute metabolic encephalopathy and sepsis secondary to urinary tract infection.  Her mental status waxes and wanes therefore she is an unsafe to be discharged home alone.  She continues to require SNF placement however it is been difficult to place.  Currently ALF had been reviewing the patient and required a TB skin testing as well as a Covid test which is Negative. ALF was interested in the patient but the patient was not accepted to their ALF due to question of payment if the pateint's Medicaid is denied.  She will remain in the hospital until a safe discharge disposition is obtained.  Consultants:   Neurology  Psychiatry    Procedures: None   Antimicrobials:  Anti-infectives (From admission, onward)   Start     Dose/Rate Route Frequency Ordered Stop    12/14/18 0100  cefTRIAXone (ROCEPHIN) 1 g in sodium chloride 0.9 % 100 mL IVPB  Status:  Discontinued     1 g 200 mL/hr over 30 Minutes Intravenous Every 24 hours 12/14/18 0010 12/17/18 0948     Subjective: Seen and examined at bedside and she is about to order breakfast.  Had no complaints.  No nausea or vomiting.  Thinks her dry skin is getting better with the lotion.  No other concerns or complaints at this time.  Objective: Vitals:   04/16/19 1718 04/16/19 2226 04/17/19 1515 04/17/19 2309  BP: 125/72 116/81 116/79 104/67  Pulse: 83 74 95 82  Resp: 15 16 18 20   Temp: 98.3 F (36.8 C) 98 F (36.7 C) 98.2 F (36.8 C) 98.4 F (36.9 C)  TempSrc: Oral Oral    SpO2: 90% 100% 100% 95%  Weight:      Height:        Intake/Output Summary (Last 24 hours) at 04/18/2019 0737 Last data filed at 04/17/2019 2128 Gross per 24 hour  Intake 220 ml  Output 1 ml  Net 219 ml   Filed Weights   12/15/18 0101 02/22/19 0359  Weight: 98.4 kg 67.1 kg   Examination: Physical Exam:  Constitutional:Nourished, well-developed African-American female currently no acute distress about to order breakfast Respiratory:Slight diminished to auscultation bilaterally no appreciable wheezing, rales, rhonchi.  Patient not tachypneic or using any accessory muscles to breathe Cardiovascular:Regular rate and rhythm.  No appreciable murmurs, rubs or gallops Abdomen:Soft, nontender, nondistended.  Bowel sounds present Neurologic:Cranial nerves  II through XII gross intact no appreciable focal deficits. Psychiatric:She appears calm and she is not as anxious and has a pleasant mood and affect.  Data Reviewed: I have personally reviewed following labs and imaging studies  CBC: No results for input(s): WBC, NEUTROABS, HGB, HCT, MCV, PLT in the last 168 hours. Basic Metabolic Panel: No results for input(s): NA, K, CL, CO2, GLUCOSE, BUN, CREATININE, CALCIUM, MG, PHOS in the last 168 hours. GFR: Estimated  Creatinine Clearance: 71.8 mL/min (by C-G formula based on SCr of 0.74 mg/dL). Liver Function Tests: No results for input(s): AST, ALT, ALKPHOS, BILITOT, PROT, ALBUMIN in the last 168 hours. No results for input(s): LIPASE, AMYLASE in the last 168 hours. No results for input(s): AMMONIA in the last 168 hours. Coagulation Profile: No results for input(s): INR, PROTIME in the last 168 hours. Cardiac Enzymes: No results for input(s): CKTOTAL, CKMB, CKMBINDEX, TROPONINI in the last 168 hours. BNP (last 3 results) No results for input(s): PROBNP in the last 8760 hours. HbA1C: No results for input(s): HGBA1C in the last 72 hours. CBG: No results for input(s): GLUCAP in the last 168 hours. Lipid Profile: No results for input(s): CHOL, HDL, LDLCALC, TRIG, CHOLHDL, LDLDIRECT in the last 72 hours. Thyroid Function Tests: No results for input(s): TSH, T4TOTAL, FREET4, T3FREE, THYROIDAB in the last 72 hours. Anemia Panel: No results for input(s): VITAMINB12, FOLATE, FERRITIN, TIBC, IRON, RETICCTPCT in the last 72 hours. Sepsis Labs: No results for input(s): PROCALCITON, LATICACIDVEN in the last 168 hours.  Recent Results (from the past 240 hour(s))  SARS CORONAVIRUS 2 (TAT 6-24 HRS) Nasopharyngeal Nasopharyngeal Swab     Status: None   Collection Time: 04/12/19  9:02 AM   Specimen: Nasopharyngeal Swab  Result Value Ref Range Status   SARS Coronavirus 2 NEGATIVE NEGATIVE Final    Comment: (NOTE) SARS-CoV-2 target nucleic acids are NOT DETECTED. The SARS-CoV-2 RNA is generally detectable in upper and lower respiratory specimens during the acute phase of infection. Negative results do not preclude SARS-CoV-2 infection, do not rule out co-infections with other pathogens, and should not be used as the sole basis for treatment or other patient management decisions. Negative results must be combined with clinical observations, patient history, and epidemiological information. The expected result  is Negative. Fact Sheet for Patients: HairSlick.no Fact Sheet for Healthcare Providers: quierodirigir.com This test is not yet approved or cleared by the [place] FDA and  has been authorized for detection and/or diagnosis of SARS-CoV-2 by FDA under an Emergency Use Authorization (EUA). This EUA will remain  in effect (meaning this test can be used) for the duration of the COVID-19 declaration under Section 56 4(b)(1) of the Act, 21 U.S.C. section 360bbb-3(b)(1), unless the authorization is terminated or revoked sooner. Performed at [facility], [address] [place], [place] [address]      RN Pressure Injury Documentation:     Estimated body mass index is 23.18 kg/m as calculated from the following:   Height as of this encounter: 5\' 7"  (1.702 m).   Weight as of this encounter: 67.1 kg.  Malnutrition Type:  Nutrition Problem: Increased nutrient needs Etiology: acute illness   Malnutrition Characteristics:  Signs/Symptoms: estimated needs   Nutrition Interventions:  Interventions: Ensure Enlive (each supplement provides 350kcal and 20 grams of protein), MVI  Radiology Studies: No results found.  Scheduled Meds: . Aveeno Treatment  1 packet Topical Daily  . diphenhydrAMINE  25 mg Intravenous Once  . enoxaparin (LOVENOX) injection  40 mg Subcutaneous Q24H  . feeding supplement (ENSURE ENLIVE)  237 mL Oral BID BM  . folic acid  1 mg Oral Daily  . ketoconazole   Topical BID  . loratadine  10 mg Oral Daily  . multivitamin with minerals  1 tablet Oral Daily  . nicotine  14 mg Transdermal Daily  . polyethylene glycol  17 g Oral BID  . senna-docusate  1 tablet Oral BID  . thiamine  100 mg Oral Daily  . triamcinolone 0.1 % cream : eucerin   Topical BID   Continuous Infusions:   LOS: 126 days   Kerney Elbe, DO [facility] PAGER is on AMION  If 7PM-7AM, please contact  night-coverage [email or web address]

## 2019-04-19 NOTE — Plan of Care (Signed)
  Problem: Education: Goal: Knowledge of General Education information will improve Description: Including pain rating scale, medication(s)/side effects and non-pharmacologic comfort measures Outcome: Progressing   Problem: Clinical Measurements: Goal: Respiratory complications will improve Outcome: Progressing   Problem: Activity: Goal: Risk for activity intolerance will decrease Outcome: Progressing   Problem: Nutrition: Goal: Adequate nutrition will be maintained Outcome: Progressing   Problem: Coping: Goal: Level of anxiety will decrease Outcome: Progressing   Problem: Elimination: Goal: Will not experience complications related to bowel motility Outcome: Progressing Goal: Will not experience complications related to urinary retention Outcome: Progressing   Problem: Pain Managment: Goal: General experience of comfort will improve Outcome: Progressing   Problem: Safety: Goal: Ability to remain free from injury will improve Outcome: Progressing   Problem: Skin Integrity: Goal: Risk for impaired skin integrity will decrease Outcome: Progressing   

## 2019-04-19 NOTE — Progress Notes (Signed)
PROGRESS NOTE    Marie Woods  UEA:[id number] DOB: 05-27-1955 DOA: 12/13/2018 PCP: Ladell Pier, MD   Brief Narrative:  The patient is a 64 year old African-American female with a past medical history significant for but not limited to asthma, hypertension, alcohol abuse who originally presented on 12/13/2018 for confusion and altered mental status.  She was found to have a UTI with urine cultures showing multiple species.  MRI of the brain was done and was suggestive of possible Wernicke's encephalopathy. She was treated with thiamine.  She subsequently improved and PT OT recommended SNF placement.  She is unsafe to discharge home given her mental status fluctuating with underlying dementia and suspected difficulty with memory issues.  She will need to be discharged to skilled nursing facility for safe disposition however currently bed is unavailable per social worker and they are in the process of attempting to place the patient but disposition is very difficult. ALF reviewed her case however she was unable to be accepted due to payment issue. Patient does not have a place to go as her apartment had to be let go due to able to hold it any more so she will need to remain in the hospital until a safe discharge disposition is obtained.   Subjective: No complaints, denies chest pain, no shortness of breath  Assessment & Plan:   Principal Problem:   Wernicke encephalopathy Active Problems:   History of alcohol use   TOBACCO ABUSE   Essential hypertension   AMS (altered mental status)   Confusion   Acute metabolic encephalopathy   Agitation   Acute lower UTI   Sepsis (HCC)   Sepsis secondary to UTI (HCC)   Normocytic anemia  Severe sepsis 2/2 UTI: Present on Admission UTI -Urine culture showed multiple species. Repeat culture showed less than 10,000 colonies.  -Was initially treated with IV Rocephin and fluids.  -Subsequently antibiotics have been discontinued. -Sepsis  physiology has resolved and she is hemodynamically stable -Chest x-ray was negative for infection. -Currently hemodynamically stable  Acute Metabolic Encephalopathy Possible Wernicke's Encephalopathy CT of the head showed mild atrophic changes without acute intracranial abnormality -MRI of the brain: Symmetric diffusion weighted and T2/FLAIR hyperintense signal abnormality within the medial thalami. Findings favor sequelae of Wernicke's encephalopathy -There was question of patient having alcohol abuse however alcohol level less than 10.  -Urine drug screen negative -Completed IV thiamine. Currently on oral thiamine now.  -Continue folic acid, multivitamin. -Outpatient follow-up with Neurology and consider Neuropsychiatry Evaluation -Continue with haloperidol lactate 5 mg IM every 6 hours as needed for Agitation and with lorazepam 1 mg IM every 6 hours as needed for extreme agitation not resolved by the Haldol -Continue with hydroxyzine 25 mg p.o. twice daily as needed for anxiety -Mental status has been fluctuating. Suspect underlying dementia with memory issues.   Essential hypertension -Amlodipine 5 mg po Daily discontinued due to soft BP -BP overall stable  History of Alcohol Abuse -Treated with CIWA protocol.  -Continue folate, thiamine and multivitamin.  -Continue with hydroxyzine as above -Currently not withdrawing   History of Tobacco Abuse -Continue nicotine patch 14 mg Transdermal patch every 24 hours  Generalized Weakness and Deconditioning -PT/OT recommending SNF. Social worker following. -Patient does need supervision as she appears to have short-term memory impairment.   Normocytic Anemia -Patient's hemoglobin/hematocrit was 12.6/38.5 on last check (04/03/19) -repeat labs in am   Chest tightness/shortness of breath and dizziness -Resolved, likely transient  -Was not orthostatic   Hyperglycemia -Mild at  101 on 2/23 but since then has improved and  was 96 on 04/03/2019 -Continue to Monitor and Trend  -Repeat BMP tomorrow  Constipation -Started Bowel Regimen with Miralax 17 grams po BID and with Senna-Docusate 1 tab po BID  Xerosis/Eczema -Has had extremely dry and flaky skin and she is complaining of itching significantly no evidence of any redness on my evaluation so will not give any Clobetasol 0.5% cream twice daily; Dry skin is improving  -Given IV Benadryl 25 mg x 1 and was scheduling her Claritin 10 mg p.o. daily -We will try topical solutions and have started triamcinolone 1% and Eucerin cream 1:1 mixture -Also have ordered Aveeno soothing bath treatments -Continue with mineral oil-hydrophilic petrolatum topically twice daily as needed for dry skin as well as Hydroscerin cream 2 Applications Topically as Needed fro Dry Skin/Relief/Moisturize and c/w Ketoconazole 2% Cream -Check CMP and CBC given itching to further evaluate but never checked few days ago as patient refused. Will attempt labs tomorrow  DVT prophylaxis: Lovenox 40 mg sq q24h Code Status: FULL CODE Family Communication: No family present at bedside  Disposition Plan: Patient has been admitted from home with acute metabolic encephalopathy and sepsis secondary to urinary tract infection.  Her mental status waxes and wanes therefore she is an unsafe to be discharged home alone.  She continues to require SNF placement however it is been difficult to place.  Currently ALF had been reviewing the patient and required a TB skin testing as well as a Covid test which is Negative. ALF was interested in the patient but the patient was not accepted to their ALF due to question of payment if the pateint's Medicaid is denied.  She will remain in the hospital until a safe discharge disposition is obtained.  Consultants:   Neurology  Psychiatry    Procedures: None   Antimicrobials:  Anti-infectives (From admission, onward)   Start     Dose/Rate Route Frequency Ordered Stop    12/14/18 0100  cefTRIAXone (ROCEPHIN) 1 g in sodium chloride 0.9 % 100 mL IVPB  Status:  Discontinued     1 g 200 mL/hr over 30 Minutes Intravenous Every 24 hours 12/14/18 0010 12/17/18 0948      Objective: Vitals:   04/17/19 2309 04/18/19 0746 04/18/19 1600 04/19/19 0900  BP: 104/67 (!) 82/54 (!) 144/88 140/73  Pulse: 82 72  78  Resp: 20 16  18   Temp: 98.4 F (36.9 C) 98.1 F (36.7 C)  98 F (36.7 C)  TempSrc:    Oral  SpO2: 95% 96%  98%  Weight:      Height:       No intake or output data in the 24 hours ending 04/19/19 1129 Filed Weights   12/15/18 0101 02/22/19 0359  Weight: 98.4 kg 67.1 kg   Examination: Physical Exam:  Constitutional:NAD Respiratory:CTA, no wheezing  Cardiovascular:RRR, no edema  Data Reviewed: I have personally reviewed following labs and imaging studies  CBC: No results for input(s): WBC, NEUTROABS, HGB, HCT, MCV, PLT in the last 168 hours. Basic Metabolic Panel: No results for input(s): NA, K, CL, CO2, GLUCOSE, BUN, CREATININE, CALCIUM, MG, PHOS in the last 168 hours. GFR: Estimated Creatinine Clearance: 71.8 mL/min (by C-G formula based on SCr of 0.74 mg/dL). Liver Function Tests: No results for input(s): AST, ALT, ALKPHOS, BILITOT, PROT, ALBUMIN in the last 168 hours. No results for input(s): LIPASE, AMYLASE in the last 168 hours. No results for input(s): AMMONIA in the last 168 hours.  Coagulation Profile: No results for input(s): INR, PROTIME in the last 168 hours. Cardiac Enzymes: No results for input(s): CKTOTAL, CKMB, CKMBINDEX, TROPONINI in the last 168 hours. BNP (last 3 results) No results for input(s): PROBNP in the last 8760 hours. HbA1C: No results for input(s): HGBA1C in the last 72 hours. CBG: No results for input(s): GLUCAP in the last 168 hours. Lipid Profile: No results for input(s): CHOL, HDL, LDLCALC, TRIG, CHOLHDL, LDLDIRECT in the last 72 hours. Thyroid Function Tests: No results for input(s): TSH, T4TOTAL,  FREET4, T3FREE, THYROIDAB in the last 72 hours. Anemia Panel: No results for input(s): VITAMINB12, FOLATE, FERRITIN, TIBC, IRON, RETICCTPCT in the last 72 hours. Sepsis Labs: No results for input(s): PROCALCITON, LATICACIDVEN in the last 168 hours.  Recent Results (from the past 240 hour(s))  SARS CORONAVIRUS 2 (TAT 6-24 HRS) Nasopharyngeal Nasopharyngeal Swab     Status: None   Collection Time: 04/12/19  9:02 AM   Specimen: Nasopharyngeal Swab  Result Value Ref Range Status   SARS Coronavirus 2 NEGATIVE NEGATIVE Final    Comment: (NOTE) SARS-CoV-2 target nucleic acids are NOT DETECTED. The SARS-CoV-2 RNA is generally detectable in upper and lower respiratory specimens during the acute phase of infection. Negative results do not preclude SARS-CoV-2 infection, do not rule out co-infections with other pathogens, and should not be used as the sole basis for treatment or other patient management decisions. Negative results must be combined with clinical observations, patient history, and epidemiological information. The expected result is Negative. Fact Sheet for Patients: HairSlick.no Fact Sheet for Healthcare Providers: quierodirigir.com This test is not yet approved or cleared by the [place] FDA and  has been authorized for detection and/or diagnosis of SARS-CoV-2 by FDA under an Emergency Use Authorization (EUA). This EUA will remain  in effect (meaning this test can be used) for the duration of the COVID-19 declaration under Section 56 4(b)(1) of the Act, 21 U.S.C. section 360bbb-3(b)(1), unless the authorization is terminated or revoked sooner. Performed at [facility], [address], [place], [place] [address]      RN Pressure Injury Documentation:     Estimated body mass index is 23.18 kg/m as calculated from the following:   Height as of this encounter: 5\' 7"  (1.702 m).   Weight as of this encounter:  67.1 kg.  Malnutrition Type:  Nutrition Problem: Increased nutrient needs Etiology: acute illness   Malnutrition Characteristics:  Signs/Symptoms: estimated needs   Nutrition Interventions:  Interventions: Ensure Enlive (each supplement provides 350kcal and 20 grams of protein), MVI  Radiology Studies: No results found.  Scheduled Meds: . Aveeno Treatment  1 packet Topical Daily  . diphenhydrAMINE  25 mg Intravenous Once  . enoxaparin (LOVENOX) injection  40 mg Subcutaneous Q24H  . feeding supplement (ENSURE ENLIVE)  237 mL Oral BID BM  . folic acid  1 mg Oral Daily  . ketoconazole   Topical BID  . loratadine  10 mg Oral Daily  . multivitamin with minerals  1 tablet Oral Daily  . nicotine  14 mg Transdermal Daily  . polyethylene glycol  17 g Oral BID  . senna-docusate  1 tablet Oral BID  . thiamine  100 mg Oral Daily  . triamcinolone 0.1 % cream : eucerin   Topical BID   Continuous Infusions:   LOS: 127 days   [employer], MD [facility] PAGER is on AMION  If 7PM-7AM, please contact night-coverage [email or web address]

## 2019-04-20 LAB — CBC
HCT: 36.3 % (ref 36.0–46.0)
Hemoglobin: 11.8 g/dL — ABNORMAL LOW (ref 12.0–15.0)
MCH: 28.1 pg (ref 26.0–34.0)
MCHC: 32.5 g/dL (ref 30.0–36.0)
MCV: 86.4 fL (ref 80.0–100.0)
Platelets: 309 10*3/uL (ref 150–400)
RBC: 4.2 MIL/uL (ref 3.87–5.11)
RDW: 15.3 % (ref 11.5–15.5)
WBC: 7.7 10*3/uL (ref 4.0–10.5)
nRBC: 0 % (ref 0.0–0.2)

## 2019-04-20 LAB — COMPREHENSIVE METABOLIC PANEL
ALT: 14 U/L (ref 0–44)
AST: 15 U/L (ref 15–41)
Albumin: 3 g/dL — ABNORMAL LOW (ref 3.5–5.0)
Alkaline Phosphatase: 40 U/L (ref 38–126)
Anion gap: 7 (ref 5–15)
BUN: 13 mg/dL (ref 8–23)
CO2: 26 mmol/L (ref 22–32)
Calcium: 9.1 mg/dL (ref 8.9–10.3)
Chloride: 109 mmol/L (ref 98–111)
Creatinine, Ser: 0.73 mg/dL (ref 0.44–1.00)
GFR calc Af Amer: >60 mL/min (ref >60)
GFR calc non Af Amer: >60 mL/min (ref >60)
Glucose, Bld: 91 mg/dL (ref 70–99)
Potassium: 3.7 mmol/L (ref 3.5–5.1)
Sodium: 142 mmol/L (ref 135–145)
Total Bilirubin: 0.7 mg/dL (ref 0.3–1.2)
Total Protein: 6.1 g/dL — ABNORMAL LOW (ref 6.5–8.1)

## 2019-04-20 NOTE — Progress Notes (Signed)
PROGRESS NOTE    Marie Woods  DVV:[id number] DOB: [birth year] DOA: 12/13/2018 PCP: Marcine Matar, MD   Brief Narrative:  The patient is a 64 year old African-American female with a past medical history significant for but not limited to asthma, hypertension, alcohol abuse who originally presented on 12/13/2018 for confusion and altered mental status.  She was found to have a UTI with urine cultures showing multiple species.  MRI of the brain was done and was suggestive of possible Wernicke's encephalopathy. She was treated with thiamine.  She subsequently improved and PT OT recommended SNF placement.  She is unsafe to discharge home given her mental status fluctuating with underlying dementia and suspected difficulty with memory issues.  She will need to be discharged to skilled nursing facility for safe disposition however currently bed is unavailable per social worker and they are in the process of attempting to place the patient but disposition is very difficult. ALF reviewed her case however she was unable to be accepted due to payment issue. Patient does not have a place to go as her apartment had to be let go due to able to hold it any more so she will need to remain in the hospital until a safe discharge disposition is obtained.   Subjective: No complaints.  Assessment & Plan:   Principal Problem:   Wernicke encephalopathy Active Problems:   History of alcohol use   TOBACCO ABUSE   Essential hypertension   AMS (altered mental status)   Confusion   Acute metabolic encephalopathy   Agitation   Acute lower UTI   Sepsis (HCC)   Sepsis secondary to UTI (HCC)   Normocytic anemia  Severe sepsis 2/2 UTI: Present on Admission UTI -Urine culture showed multiple species. Repeat culture showed less than 10,000 colonies.  -Was initially treated with IV Rocephin and fluids.  -Subsequently antibiotics have been discontinued. -Sepsis physiology has resolved and she is  hemodynamically stable -Chest x-ray was negative for infection. -Currently hemodynamically stable  Acute Metabolic Encephalopathy Possible Wernicke's Encephalopathy CT of the head showed mild atrophic changes without acute intracranial abnormality -MRI of the brain: Symmetric diffusion weighted and T2/FLAIR hyperintense signal abnormality within the medial thalami. Findings favor sequelae of Wernicke's encephalopathy -There was question of patient having alcohol abuse however alcohol level less than 10.  -Urine drug screen negative -Completed IV thiamine. Currently on oral thiamine now.  -Continue folic acid, multivitamin. -Outpatient follow-up with Neurology and consider Neuropsychiatry Evaluation -Continue with haloperidol lactate 5 mg IM every 6 hours as needed for Agitation and with lorazepam 1 mg IM every 6 hours as needed for extreme agitation not resolved by the Haldol -Continue with hydroxyzine 25 mg p.o. twice daily as needed for anxiety -Mental status has been fluctuating. Suspect underlying dementia with memory issues.   Essential hypertension -Amlodipine 5 mg po Daily discontinued due to soft BP -BP overall stable  History of Alcohol Abuse -Treated with CIWA protocol.  -Continue folate, thiamine and multivitamin.  -Continue with hydroxyzine as above -Currently not withdrawing   History of Tobacco Abuse -Continue nicotine patch 14 mg Transdermal patch every 24 hours  Generalized Weakness and Deconditioning -PT/OT recommending SNF. Social worker following. -Patient does need supervision as she appears to have short-term memory impairment.   Normocytic Anemia -Patient's hemoglobin/hematocrit was 12.6/38.5 on last check (04/03/19) -Labs stable on 3/18  Chest tightness/shortness of breath and dizziness -Resolved, likely transient  -Was not orthostatic   Hyperglycemia -Mild at 101 on 2/23 but since then has improved  and was 96 on 04/03/2019 -Continue to  Monitor and Trend  -Labs stable on 3/18  Constipation -Started Bowel Regimen with Miralax 17 grams po BID and with Senna-Docusate 1 tab po BID  Xerosis/Eczema -Has had extremely dry and flaky skin and she is complaining of itching significantly no evidence of any redness on my evaluation so will not give any Clobetasol 0.5% cream twice daily; Dry skin is improving  -Given IV Benadryl 25 mg x 1 and was scheduling her Claritin 10 mg p.o. daily -We will try topical solutions and have started triamcinolone 1% and Eucerin cream 1:1 mixture -Also have ordered Aveeno soothing bath treatments -Continue with mineral oil-hydrophilic petrolatum topically twice daily as needed for dry skin as well as Hydroscerin cream 2 Applications Topically as Needed fro Dry Skin/Relief/Moisturize and c/w Ketoconazole 2% Cream -Check CMP and CBC given itching to further evaluate but never checked few days ago as patient refused. Will attempt labs tomorrow  DVT prophylaxis: Lovenox 40 mg sq q24h Code Status: FULL CODE Family Communication: No family present at bedside  Disposition Plan: Patient has been admitted from home with acute metabolic encephalopathy and sepsis secondary to urinary tract infection.  Her mental status waxes and wanes therefore she is an unsafe to be discharged home alone.  She continues to require SNF placement however it is been difficult to place.  Currently ALF had been reviewing the patient and required a TB skin testing as well as a Covid test which is Negative. ALF was interested in the patient but the patient was not accepted to their ALF due to question of payment if the pateint's Medicaid is denied.  She will remain in the hospital until a safe discharge disposition is obtained.  Consultants:   Neurology  Psychiatry    Procedures: None   Antimicrobials:  Anti-infectives (From admission, onward)   Start     Dose/Rate Route Frequency Ordered Stop   12/14/18 0100  cefTRIAXone  (ROCEPHIN) 1 g in sodium chloride 0.9 % 100 mL IVPB  Status:  Discontinued     1 g 200 mL/hr over 30 Minutes Intravenous Every 24 hours 12/14/18 0010 12/17/18 0948      Objective: Vitals:   04/18/19 1600 04/19/19 0900 04/19/19 2210 04/20/19 0751  BP: (!) 144/88 140/73 129/86 (!) 144/113  Pulse:  78 79 73  Resp:  18 17 18   Temp:  98 F (36.7 C) 98.2 F (36.8 C) 97.9 F (36.6 C)  TempSrc:  Oral Oral Oral  SpO2:  98% 100% 99%  Weight:      Height:        Intake/Output Summary (Last 24 hours) at 04/20/2019 1057 Last data filed at 04/20/2019 0745 Gross per 24 hour  Intake 240 ml  Output --  Net 240 ml   Filed Weights   12/15/18 0101 02/22/19 0359  Weight: 98.4 kg 67.1 kg   Examination: Physical Exam:  Constitutional: No distress  Data Reviewed: I have personally reviewed following labs and imaging studies  CBC: Recent Labs  Lab 04/20/19 0238  WBC 7.7  HGB 11.8*  HCT 36.3  MCV 86.4  PLT 309   Basic Metabolic Panel: Recent Labs  Lab 04/20/19 0238  NA 142  K 3.7  CL 109  CO2 26  GLUCOSE 91  BUN 13  CREATININE 0.73  CALCIUM 9.1   GFR: Estimated Creatinine Clearance: 71.8 mL/min (by C-G formula based on SCr of 0.73 mg/dL). Liver Function Tests: Recent Labs  Lab 04/20/19 [phone]  AST 15  ALT 14  ALKPHOS 40  BILITOT 0.7  PROT 6.1*  ALBUMIN 3.0*   No results for input(s): LIPASE, AMYLASE in the last 168 hours. No results for input(s): AMMONIA in the last 168 hours. Coagulation Profile: No results for input(s): INR, PROTIME in the last 168 hours. Cardiac Enzymes: No results for input(s): CKTOTAL, CKMB, CKMBINDEX, TROPONINI in the last 168 hours. BNP (last 3 results) No results for input(s): PROBNP in the last 8760 hours. HbA1C: No results for input(s): HGBA1C in the last 72 hours. CBG: No results for input(s): GLUCAP in the last 168 hours. Lipid Profile: No results for input(s): CHOL, HDL, LDLCALC, TRIG, CHOLHDL, LDLDIRECT in the last 72  hours. Thyroid Function Tests: No results for input(s): TSH, T4TOTAL, FREET4, T3FREE, THYROIDAB in the last 72 hours. Anemia Panel: No results for input(s): VITAMINB12, FOLATE, FERRITIN, TIBC, IRON, RETICCTPCT in the last 72 hours. Sepsis Labs: No results for input(s): PROCALCITON, LATICACIDVEN in the last 168 hours.  Recent Results (from the past 240 hour(s))  SARS CORONAVIRUS 2 (TAT 6-24 HRS) Nasopharyngeal Nasopharyngeal Swab     Status: None   Collection Time: 04/12/19  9:02 AM   Specimen: Nasopharyngeal Swab  Result Value Ref Range Status   SARS Coronavirus 2 NEGATIVE NEGATIVE Final    Comment: (NOTE) SARS-CoV-2 target nucleic acids are NOT DETECTED. The SARS-CoV-2 RNA is generally detectable in upper and lower respiratory specimens during the acute phase of infection. Negative results do not preclude SARS-CoV-2 infection, do not rule out co-infections with other pathogens, and should not be used as the sole basis for treatment or other patient management decisions. Negative results must be combined with clinical observations, patient history, and epidemiological information. The expected result is Negative. Fact Sheet for Patients: SugarRoll.be Fact Sheet for Healthcare Providers: https://www.woods-mathews.com/ This test is not yet approved or cleared by the [place] FDA and  has been authorized for detection and/or diagnosis of SARS-CoV-2 by FDA under an Emergency Use Authorization (EUA). This EUA will remain  in effect (meaning this test can be used) for the duration of the COVID-19 declaration under Section 56 4(b)(1) of the Act, 21 U.S.C. section 360bbb-3(b)(1), unless the authorization is terminated or revoked sooner. Performed at [facility], [place] [address] [place], [place] [address]      RN Pressure Injury Documentation:     Estimated body mass index is 23.18 kg/m as calculated from the following:   Height  as of this encounter: 5\' 7"  (1.702 m).   Weight as of this encounter: 67.1 kg.  Malnutrition Type:  Nutrition Problem: Increased nutrient needs Etiology: acute illness   Malnutrition Characteristics:  Signs/Symptoms: estimated needs   Nutrition Interventions:  Interventions: Ensure Enlive (each supplement provides 350kcal and 20 grams of protein), MVI  Radiology Studies: No results found.  Scheduled Meds: . Aveeno [employer] Treatment  1 packet Topical Daily  . diphenhydrAMINE  25 mg Intravenous Once  . enoxaparin (LOVENOX) injection  40 mg Subcutaneous Q24H  . feeding supplement (ENSURE ENLIVE)  237 mL Oral BID BM  . folic acid  1 mg Oral Daily  . ketoconazole   Topical BID  . loratadine  10 mg Oral Daily  . multivitamin with minerals  1 tablet Oral Daily  . nicotine  14 mg Transdermal Daily  . polyethylene glycol  17 g Oral BID  . senna-docusate  1 tablet Oral BID  . thiamine  100 mg Oral Daily  . triamcinolone 0.1 % cream :  eucerin   Topical BID   Continuous Infusions:   LOS: 128 days   Pamella Pert, MD [facility] PAGER is on AMION  If 7PM-7AM, please contact night-coverage [email or web address]

## 2019-04-21 NOTE — Progress Notes (Signed)
Pt refusing PO am meds at this time.  Will try again.

## 2019-04-21 NOTE — Progress Notes (Signed)
PROGRESS NOTE    Marie Woods  ZYY:[id number] DOB: Feb 23, 1955 DOA: 12/13/2018 PCP: Marcine Matar, MD   Brief Narrative:  The patient is a 64 year old African-American female with a past medical history significant for but not limited to asthma, hypertension, alcohol abuse who originally presented on 12/13/2018 for confusion and altered mental status.  She was found to have a UTI with urine cultures showing multiple species.  MRI of the brain was done and was suggestive of possible Wernicke's encephalopathy. She was treated with thiamine.  She subsequently improved and PT OT recommended SNF placement.  She is unsafe to discharge home given her mental status fluctuating with underlying dementia and suspected difficulty with memory issues.  She will need to be discharged to skilled nursing facility for safe disposition however currently bed is unavailable per social worker and they are in the process of attempting to place the patient but disposition is very difficult. ALF reviewed her case however she was unable to be accepted due to payment issue. Patient does not have a place to go as her apartment had to be let go due to able to hold it any more so she will need to remain in the hospital until a safe discharge disposition is obtained.   Subjective: Sleeping, wakes up easily, denies any complaints, no chest pain, no shortness of breath  Assessment & Plan:   Principal Problem:   Wernicke encephalopathy Active Problems:   History of alcohol use   TOBACCO ABUSE   Essential hypertension   AMS (altered mental status)   Confusion   Acute metabolic encephalopathy   Agitation   Acute lower UTI   Sepsis (HCC)   Sepsis secondary to UTI (HCC)   Normocytic anemia  Severe sepsis 2/2 UTI: Present on Admission UTI -Urine culture showed multiple species. Repeat culture showed less than 10,000 colonies.  -Was initially treated with IV Rocephin and fluids.  -Subsequently antibiotics have  been discontinued. -Sepsis physiology has resolved and she is hemodynamically stable -Chest x-ray was negative for infection. -Currently hemodynamically stable  Acute Metabolic Encephalopathy Possible Wernicke's Encephalopathy CT of the head showed mild atrophic changes without acute intracranial abnormality -MRI of the brain: Symmetric diffusion weighted and T2/FLAIR hyperintense signal abnormality within the medial thalami. Findings favor sequelae of Wernicke's encephalopathy -There was question of patient having alcohol abuse however alcohol level less than 10.  -Urine drug screen negative -Completed IV thiamine. Currently on oral thiamine now.  -Continue folic acid, multivitamin. -Outpatient follow-up with Neurology and consider Neuropsychiatry Evaluation -Continue with haloperidol lactate 5 mg IM every 6 hours as needed for Agitation and with lorazepam 1 mg IM every 6 hours as needed for extreme agitation not resolved by the Haldol -Continue with hydroxyzine 25 mg p.o. twice daily as needed for anxiety -Mental status has been fluctuating. Suspect underlying dementia with memory issues.   Essential hypertension -Amlodipine 5 mg po Daily discontinued due to soft BP -BP overall stable  History of Alcohol Abuse -Treated with CIWA protocol.  -Continue folate, thiamine and multivitamin.  -Continue with hydroxyzine as above -Currently not withdrawing   History of Tobacco Abuse -Continue nicotine patch 14 mg Transdermal patch every 24 hours  Generalized Weakness and Deconditioning -PT/OT recommending SNF. Social worker following. -Patient does need supervision as she appears to have short-term memory impairment.   Normocytic Anemia -Patient's hemoglobin/hematocrit was 12.6/38.5 on last check (04/03/19) -Labs stable on 3/18  Chest tightness/shortness of breath and dizziness -Resolved, likely transient  -Was not orthostatic  Hyperglycemia -Mild at 101 on 2/23 but  since then has improved and was 96 on 04/03/2019 -Continue to Monitor and Trend  -Labs stable on 3/18  Constipation -Started Bowel Regimen with Miralax 17 grams po BID and with Senna-Docusate 1 tab po BID  Xerosis/Eczema -Has had extremely dry and flaky skin and she is complaining of itching significantly no evidence of any redness on my evaluation so will not give any Clobetasol 0.5% cream twice daily; Dry skin is improving  -Given IV Benadryl 25 mg x 1 and was scheduling her Claritin 10 mg p.o. daily -We will try topical solutions and have started triamcinolone 1% and Eucerin cream 1:1 mixture -Also have ordered Aveeno soothing bath treatments -Continue with mineral oil-hydrophilic petrolatum topically twice daily as needed for dry skin as well as Hydroscerin cream 2 Applications Topically as Needed fro Dry Skin/Relief/Moisturize and c/w Ketoconazole 2% Cream  DVT prophylaxis: Lovenox 40 mg sq q24h Code Status: FULL CODE Family Communication: No family present at bedside  Disposition Plan: Patient has been admitted from home with acute metabolic encephalopathy and sepsis secondary to urinary tract infection.  Her mental status waxes and wanes therefore she is an unsafe to be discharged home alone.  She continues to require SNF placement however it is been difficult to place.  Currently ALF had been reviewing the patient and required a TB skin testing as well as a Covid test which is Negative. ALF was interested in the patient but the patient was not accepted to their ALF due to question of payment if the pateint's Medicaid is denied.  She will remain in the hospital until a safe discharge disposition is obtained.  Consultants:   Neurology  Psychiatry    Procedures: None   Antimicrobials:  Anti-infectives (From admission, onward)   Start     Dose/Rate Route Frequency Ordered Stop   12/14/18 0100  cefTRIAXone (ROCEPHIN) 1 g in sodium chloride 0.9 % 100 mL IVPB  Status:  Discontinued      1 g 200 mL/hr over 30 Minutes Intravenous Every 24 hours 12/14/18 0010 12/17/18 0948      Objective: Vitals:   04/20/19 0751 04/20/19 2213 04/21/19 0815 04/21/19 0817  BP: (!) 144/113 135/72 (!) 88/56 98/83  Pulse: 73 78 61   Resp: 18 20 19    Temp: 97.9 F (36.6 C) 98 F (36.7 C) 98 F (36.7 C)   TempSrc: Oral Oral Oral   SpO2: 99% 98% 98%   Weight:      Height:       No intake or output data in the 24 hours ending 04/21/19 1031 Filed Weights   12/15/18 0101 02/22/19 0359  Weight: 98.4 kg 67.1 kg   Examination: Physical Exam:  Constitutional: NAD  Data Reviewed: I have personally reviewed following labs and imaging studies  CBC: Recent Labs  Lab 04/20/19 0238  WBC 7.7  HGB 11.8*  HCT 36.3  MCV 86.4  PLT 824   Basic Metabolic Panel: Recent Labs  Lab 04/20/19 0238  NA 142  K 3.7  CL 109  CO2 26  GLUCOSE 91  BUN 13  CREATININE 0.73  CALCIUM 9.1   GFR: Estimated Creatinine Clearance: 71.8 mL/min (by C-G formula based on SCr of 0.73 mg/dL). Liver Function Tests: Recent Labs  Lab 04/20/19 0238  AST 15  ALT 14  ALKPHOS 40  BILITOT 0.7  PROT 6.1*  ALBUMIN 3.0*   No results for input(s): LIPASE, AMYLASE in the last 168 hours. No results  for input(s): AMMONIA in the last 168 hours. Coagulation Profile: No results for input(s): INR, PROTIME in the last 168 hours. Cardiac Enzymes: No results for input(s): CKTOTAL, CKMB, CKMBINDEX, TROPONINI in the last 168 hours. BNP (last 3 results) No results for input(s): PROBNP in the last 8760 hours. HbA1C: No results for input(s): HGBA1C in the last 72 hours. CBG: No results for input(s): GLUCAP in the last 168 hours. Lipid Profile: No results for input(s): CHOL, HDL, LDLCALC, TRIG, CHOLHDL, LDLDIRECT in the last 72 hours. Thyroid Function Tests: No results for input(s): TSH, T4TOTAL, FREET4, T3FREE, THYROIDAB in the last 72 hours. Anemia Panel: No results for input(s): VITAMINB12, FOLATE, FERRITIN,  TIBC, IRON, RETICCTPCT in the last 72 hours. Sepsis Labs: No results for input(s): PROCALCITON, LATICACIDVEN in the last 168 hours.  Recent Results (from the past 240 hour(s))  SARS CORONAVIRUS 2 (TAT 6-24 HRS) Nasopharyngeal Nasopharyngeal Swab     Status: None   Collection Time: 04/12/19  9:02 AM   Specimen: Nasopharyngeal Swab  Result Value Ref Range Status   SARS Coronavirus 2 NEGATIVE NEGATIVE Final    Comment: (NOTE) SARS-CoV-2 target nucleic acids are NOT DETECTED. The SARS-CoV-2 RNA is generally detectable in upper and lower respiratory specimens during the acute phase of infection. Negative results do not preclude SARS-CoV-2 infection, do not rule out co-infections with other pathogens, and should not be used as the sole basis for treatment or other patient management decisions. Negative results must be combined with clinical observations, patient history, and epidemiological information. The expected result is Negative. Fact Sheet for Patients: HairSlick.no Fact Sheet for Healthcare Providers: quierodirigir.com This test is not yet approved or cleared by the [place] FDA and  has been authorized for detection and/or diagnosis of SARS-CoV-2 by FDA under an Emergency Use Authorization (EUA). This EUA will remain  in effect (meaning this test can be used) for the duration of the COVID-19 declaration under Section 56 4(b)(1) of the Act, 21 U.S.C. section 360bbb-3(b)(1), unless the authorization is terminated or revoked sooner. Performed at [facility], [address], [place], [place] [address]      RN Pressure Injury Documentation:     Estimated body mass index is 23.18 kg/m as calculated from the following:   Height as of this encounter: 5\' 7"  (1.702 m).   Weight as of this encounter: 67.1 kg.  Malnutrition Type:  Nutrition Problem: Increased nutrient needs Etiology: acute illness   Malnutrition  Characteristics:  Signs/Symptoms: estimated needs   Nutrition Interventions:  Interventions: Ensure Enlive (each supplement provides 350kcal and 20 grams of protein), MVI  Radiology Studies: No results found.  Scheduled Meds: . Aveeno Treatment  1 packet Topical Daily  . diphenhydrAMINE  25 mg Intravenous Once  . enoxaparin (LOVENOX) injection  40 mg Subcutaneous Q24H  . feeding supplement (ENSURE ENLIVE)  237 mL Oral BID BM  . folic acid  1 mg Oral Daily  . ketoconazole   Topical BID  . loratadine  10 mg Oral Daily  . multivitamin with minerals  1 tablet Oral Daily  . nicotine  14 mg Transdermal Daily  . polyethylene glycol  17 g Oral BID  . senna-docusate  1 tablet Oral BID  . thiamine  100 mg Oral Daily  . triamcinolone 0.1 % cream : eucerin   Topical BID   Continuous Infusions:   LOS: 129 days   [employer], MD [facility] PAGER is on AMION  If 7PM-7AM, please contact night-coverage  [email or web address]

## 2019-04-21 NOTE — Progress Notes (Addendum)
Pt walking up to nurse's station every few minutes without mask on & requesting things we do not have such as a space heater and irritated that we do not have this.  Pt yelling at staff when asked to use call light.

## 2019-04-22 NOTE — Progress Notes (Signed)
PROGRESS NOTE    Marie Woods  [id number] DOB: 16-Apr-1955 DOA: 12/13/2018 PCP: Ladell Pier, MD   Brief Narrative:  The patient is a 64 year old African-American female with a past medical history significant for but not limited to asthma, hypertension, alcohol abuse who originally presented on 12/13/2018 for confusion and altered mental status.  She was found to have a UTI with urine cultures showing multiple species.  MRI of the brain was done and was suggestive of possible Wernicke's encephalopathy. She was treated with thiamine.  She subsequently improved and PT OT recommended SNF placement.  She is unsafe to discharge home given her mental status fluctuating with underlying dementia and suspected difficulty with memory issues.  She will need to be discharged to skilled nursing facility for safe disposition however currently bed is unavailable per social worker and they are in the process of attempting to place the patient but disposition is very difficult. ALF reviewed her case however she was unable to be accepted due to payment issue. Patient does not have a place to go as her apartment had to be let go due to able to hold it any more so she will need to remain in the hospital until a safe discharge disposition is obtained.   Subjective: No complaints  Assessment & Plan:   Principal Problem:   Wernicke encephalopathy Active Problems:   History of alcohol use   TOBACCO ABUSE   Essential hypertension   AMS (altered mental status)   Confusion   Acute metabolic encephalopathy   Agitation   Acute lower UTI   Sepsis (HCC)   Sepsis secondary to UTI (HCC)   Normocytic anemia  Severe sepsis 2/2 UTI: Present on Admission UTI -Urine culture showed multiple species. Repeat culture showed less than 10,000 colonies.  -Was initially treated with IV Rocephin and fluids.  -Subsequently antibiotics have been discontinued. -Sepsis physiology has resolved and she is  hemodynamically stable -Chest x-ray was negative for infection. -Currently hemodynamically stable  Acute Metabolic Encephalopathy Possible Wernicke's Encephalopathy CT of the head showed mild atrophic changes without acute intracranial abnormality -MRI of the brain: Symmetric diffusion weighted and T2/FLAIR hyperintense signal abnormality within the medial thalami. Findings favor sequelae of Wernicke's encephalopathy -There was question of patient having alcohol abuse however alcohol level less than 10.  -Urine drug screen negative -Completed IV thiamine. Currently on oral thiamine now.  -Continue folic acid, multivitamin. -Outpatient follow-up with Neurology and consider Neuropsychiatry Evaluation -Continue with haloperidol lactate 5 mg IM every 6 hours as needed for Agitation and with lorazepam 1 mg IM every 6 hours as needed for extreme agitation not resolved by the Haldol -Continue with hydroxyzine 25 mg p.o. twice daily as needed for anxiety -Mental status has been fluctuating. Suspect underlying dementia with memory issues.   Essential hypertension -Amlodipine 5 mg po Daily discontinued due to soft BP -BP overall stable  History of Alcohol Abuse -Treated with CIWA protocol.  -Continue folate, thiamine and multivitamin.  -Continue with hydroxyzine as above -Currently not withdrawing   History of Tobacco Abuse -Continue nicotine patch 14 mg Transdermal patch every 24 hours  Generalized Weakness and Deconditioning -PT/OT recommending SNF. Social worker following. -Patient does need supervision as she appears to have short-term memory impairment.   Normocytic Anemia -Patient's hemoglobin/hematocrit was 12.6/38.5 on last check (04/03/19) -Labs stable on 3/18  Chest tightness/shortness of breath and dizziness -Resolved, likely transient  -Was not orthostatic   Hyperglycemia -Mild at 101 on 2/23 but since then has improved  and was 96 on 04/03/2019 -Continue to  Monitor and Trend  -Labs stable on 3/18  Constipation -Started Bowel Regimen with Miralax 17 grams po BID and with Senna-Docusate 1 tab po BID  Xerosis/Eczema -Has had extremely dry and flaky skin and she is complaining of itching significantly no evidence of any redness on my evaluation so will not give any Clobetasol 0.5% cream twice daily; Dry skin is improving  -Given IV Benadryl 25 mg x 1 and was scheduling her Claritin 10 mg p.o. daily -We will try topical solutions and have started triamcinolone 1% and Eucerin cream 1:1 mixture -Also have ordered Aveeno soothing bath treatments -Continue with mineral oil-hydrophilic petrolatum topically twice daily as needed for dry skin as well as Hydroscerin cream 2 Applications Topically as Needed fro Dry Skin/Relief/Moisturize and c/w Ketoconazole 2% Cream  DVT prophylaxis: Lovenox 40 mg sq q24h Code Status: FULL CODE Family Communication: No family present at bedside  Disposition Plan: Patient has been admitted from home with acute metabolic encephalopathy and sepsis secondary to urinary tract infection.  Her mental status waxes and wanes therefore she is an unsafe to be discharged home alone.  She continues to require SNF placement however it is been difficult to place.  Currently ALF had been reviewing the patient and required a TB skin testing as well as a Covid test which is Negative. ALF was interested in the patient but the patient was not accepted to their ALF due to question of payment if the pateint's Medicaid is denied.  She will remain in the hospital until a safe discharge disposition is obtained.  Consultants:   Neurology  Psychiatry    Procedures: None   Antimicrobials:  Anti-infectives (From admission, onward)   Start     Dose/Rate Route Frequency Ordered Stop   12/14/18 0100  cefTRIAXone (ROCEPHIN) 1 g in sodium chloride 0.9 % 100 mL IVPB  Status:  Discontinued     1 g 200 mL/hr over 30 Minutes Intravenous Every 24 hours  12/14/18 0010 12/17/18 0948      Objective: Vitals:   04/21/19 0815 04/21/19 0817 04/21/19 2056 04/22/19 0826  BP: (!) 88/56 98/83 (!) 90/53 103/78  Pulse: 61  83 74  Resp: 19  14   Temp: 98 F (36.7 C)  98.1 F (36.7 C) 98 F (36.7 C)  TempSrc: Oral  Oral Oral  SpO2: 98%  98% 100%  Weight:      Height:        Intake/Output Summary (Last 24 hours) at 04/22/2019 0952 Last data filed at 04/22/2019 0900 Gross per 24 hour  Intake 360 ml  Output --  Net 360 ml   Filed Weights   12/15/18 0101 02/22/19 0359  Weight: 98.4 kg 67.1 kg   Examination: Physical Exam:  Constitutional: NAD, calm, comfortable Vitals:   04/21/19 0815 04/21/19 0817 04/21/19 2056 04/22/19 0826  BP: (!) 88/56 98/83 (!) 90/53 103/78  Pulse: 61  83 74  Resp: 19  14   Temp: 98 F (36.7 C)  98.1 F (36.7 C) 98 F (36.7 C)  TempSrc: Oral  Oral Oral  SpO2: 98%  98% 100%  Weight:      Height:       Eyes: PERRL, lids and conjunctivae normal Respiratory: clear to auscultation bilaterally, no wheezing, no crackles.  Cardiovascular: Regular rate and rhythm, no murmurs / rubs / gallops.   Data Reviewed: I have personally reviewed following labs and imaging studies  CBC: Recent Labs  Lab  04/20/19 0238  WBC 7.7  HGB 11.8*  HCT 36.3  MCV 86.4  PLT 309   Basic Metabolic Panel: Recent Labs  Lab 04/20/19 0238  NA 142  K 3.7  CL 109  CO2 26  GLUCOSE 91  BUN 13  CREATININE 0.73  CALCIUM 9.1   GFR: Estimated Creatinine Clearance: 71.8 mL/min (by C-G formula based on SCr of 0.73 mg/dL). Liver Function Tests: Recent Labs  Lab 04/20/19 0238  AST 15  ALT 14  ALKPHOS 40  BILITOT 0.7  PROT 6.1*  ALBUMIN 3.0*   No results for input(s): LIPASE, AMYLASE in the last 168 hours. No results for input(s): AMMONIA in the last 168 hours. Coagulation Profile: No results for input(s): INR, PROTIME in the last 168 hours. Cardiac Enzymes: No results for input(s): CKTOTAL, CKMB, CKMBINDEX, TROPONINI  in the last 168 hours. BNP (last 3 results) No results for input(s): PROBNP in the last 8760 hours. HbA1C: No results for input(s): HGBA1C in the last 72 hours. CBG: No results for input(s): GLUCAP in the last 168 hours. Lipid Profile: No results for input(s): CHOL, HDL, LDLCALC, TRIG, CHOLHDL, LDLDIRECT in the last 72 hours. Thyroid Function Tests: No results for input(s): TSH, T4TOTAL, FREET4, T3FREE, THYROIDAB in the last 72 hours. Anemia Panel: No results for input(s): VITAMINB12, FOLATE, FERRITIN, TIBC, IRON, RETICCTPCT in the last 72 hours. Sepsis Labs: No results for input(s): PROCALCITON, LATICACIDVEN in the last 168 hours.  Recent Results (from the past 240 hour(s))  SARS CORONAVIRUS 2 (TAT 6-24 HRS) Nasopharyngeal Nasopharyngeal Swab     Status: None   Collection Time: 04/12/19  9:02 AM   Specimen: Nasopharyngeal Swab  Result Value Ref Range Status   SARS Coronavirus 2 NEGATIVE NEGATIVE Final    Comment: (NOTE) SARS-CoV-2 target nucleic acids are NOT DETECTED. The SARS-CoV-2 RNA is generally detectable in upper and lower respiratory specimens during the acute phase of infection. Negative results do not preclude SARS-CoV-2 infection, do not rule out co-infections with other pathogens, and should not be used as the sole basis for treatment or other patient management decisions. Negative results must be combined with clinical observations, patient history, and epidemiological information. The expected result is Negative. Fact Sheet for Patients: HairSlick.no Fact Sheet for Healthcare Providers: quierodirigir.com This test is not yet approved or cleared by the [place] FDA and  has been authorized for detection and/or diagnosis of SARS-CoV-2 by FDA under an Emergency Use Authorization (EUA). This EUA will remain  in effect (meaning this test can be used) for the duration of the COVID-19 declaration under Section  56 4(b)(1) of the Act, 21 U.S.C. section 360bbb-3(b)(1), unless the authorization is terminated or revoked sooner. Performed at [facility], [address], [place], [place] [address]      RN Pressure Injury Documentation:     Estimated body mass index is 23.18 kg/m as calculated from the following:   Height as of this encounter: 5\' 7"  (1.702 m).   Weight as of this encounter: 67.1 kg.  Malnutrition Type:  Nutrition Problem: Increased nutrient needs Etiology: acute illness   Malnutrition Characteristics:  Signs/Symptoms: estimated needs   Nutrition Interventions:  Interventions: Ensure Enlive (each supplement provides 350kcal and 20 grams of protein), MVI  Radiology Studies: No results found.  Scheduled Meds: . Aveeno Treatment  1 packet Topical Daily  . diphenhydrAMINE  25 mg Intravenous Once  . enoxaparin (LOVENOX) injection  40 mg Subcutaneous Q24H  . feeding supplement (ENSURE ENLIVE)  237 mL Oral BID BM  . folic acid  1 mg Oral Daily  . ketoconazole   Topical BID  . loratadine  10 mg Oral Daily  . multivitamin with minerals  1 tablet Oral Daily  . nicotine  14 mg Transdermal Daily  . polyethylene glycol  17 g Oral BID  . senna-docusate  1 tablet Oral BID  . thiamine  100 mg Oral Daily  . triamcinolone 0.1 % cream : eucerin   Topical BID   Continuous Infusions:   LOS: 130 days   Pamella Pert, MD [facility] PAGER is on AMION  If 7PM-7AM, please contact night-coverage [email or web address]

## 2019-04-23 NOTE — Progress Notes (Signed)
PROGRESS NOTE    Marie Woods  ZOX:[id number] DOB: Nov 07, 1955 DOA: 12/13/2018 PCP: Marcine Matar, MD   Brief Narrative:  The patient is a 64 year old African-American female with a past medical history significant for but not limited to asthma, hypertension, alcohol abuse who originally presented on 12/13/2018 for confusion and altered mental status.  She was found to have a UTI with urine cultures showing multiple species.  MRI of the brain was done and was suggestive of possible Wernicke's encephalopathy. She was treated with thiamine.  She subsequently improved and PT OT recommended SNF placement.  She is unsafe to discharge home given her mental status fluctuating with underlying dementia and suspected difficulty with memory issues.  She will need to be discharged to skilled nursing facility for safe disposition however currently bed is unavailable per social worker and they are in the process of attempting to place the patient but disposition is very difficult. ALF reviewed her case however she was unable to be accepted due to payment issue. Patient does not have a place to go as her apartment had to be let go due to able to hold it any more so she will need to remain in the hospital until a safe discharge disposition is obtained.   Subjective: In bed, comfortable, no complaints  Assessment & Plan:   Principal Problem:   Wernicke encephalopathy Active Problems:   History of alcohol use   TOBACCO ABUSE   Essential hypertension   AMS (altered mental status)   Confusion   Acute metabolic encephalopathy   Agitation   Acute lower UTI   Sepsis (HCC)   Sepsis secondary to UTI (HCC)   Normocytic anemia  Severe sepsis 2/2 UTI: Present on Admission UTI -Urine culture showed multiple species. Repeat culture showed less than 10,000 colonies.  -Was initially treated with IV Rocephin and fluids.  -Subsequently antibiotics have been discontinued. -Sepsis physiology has resolved  and she is hemodynamically stable -Chest x-ray was negative for infection. -Currently hemodynamically stable  Acute Metabolic Encephalopathy Possible Wernicke's Encephalopathy CT of the head showed mild atrophic changes without acute intracranial abnormality -MRI of the brain: Symmetric diffusion weighted and T2/FLAIR hyperintense signal abnormality within the medial thalami. Findings favor sequelae of Wernicke's encephalopathy -There was question of patient having alcohol abuse however alcohol level less than 10.  -Urine drug screen negative -Completed IV thiamine. Currently on oral thiamine now.  -Continue folic acid, multivitamin. -Outpatient follow-up with Neurology and consider Neuropsychiatry Evaluation -Continue with haloperidol lactate 5 mg IM every 6 hours as needed for Agitation and with lorazepam 1 mg IM every 6 hours as needed for extreme agitation not resolved by the Haldol -Continue with hydroxyzine 25 mg p.o. twice daily as needed for anxiety -Mental status has been fluctuating. Suspect underlying dementia with memory issues.   Essential hypertension -Amlodipine 5 mg po Daily discontinued due to soft BP -BP overall stable  History of Alcohol Abuse -Treated with CIWA protocol.  -Continue folate, thiamine and multivitamin.  -Continue with hydroxyzine as above -Currently not withdrawing   History of Tobacco Abuse -Continue nicotine patch 14 mg Transdermal patch every 24 hours  Generalized Weakness and Deconditioning -PT/OT recommending SNF. Social worker following. -Patient does need supervision as she appears to have short-term memory impairment.   Normocytic Anemia -Patient's hemoglobin/hematocrit was 12.6/38.5 on last check (04/03/19) -Labs stable on 3/18  Chest tightness/shortness of breath and dizziness -Resolved, likely transient  -Was not orthostatic   Hyperglycemia -Mild at 101 on 2/23 but since  then has improved and was 96 on  04/03/2019 -Continue to Monitor and Trend  -Labs stable on 3/18  Constipation -Started Bowel Regimen with Miralax 17 grams po BID and with Senna-Docusate 1 tab po BID  Xerosis/Eczema -Has had extremely dry and flaky skin and she is complaining of itching significantly no evidence of any redness on my evaluation so will not give any Clobetasol 0.5% cream twice daily; Dry skin is improving  -Given IV Benadryl 25 mg x 1 and was scheduling her Claritin 10 mg p.o. daily -We will try topical solutions and have started triamcinolone 1% and Eucerin cream 1:1 mixture -Also have ordered Aveeno soothing bath treatments -Continue with mineral oil-hydrophilic petrolatum topically twice daily as needed for dry skin as well as Hydroscerin cream 2 Applications Topically as Needed fro Dry Skin/Relief/Moisturize and c/w Ketoconazole 2% Cream  DVT prophylaxis: Lovenox 40 mg sq q24h Code Status: FULL CODE Family Communication: No family present at bedside  Disposition Plan: Patient has been admitted from home with acute metabolic encephalopathy and sepsis secondary to urinary tract infection.  Her mental status waxes and wanes therefore she is an unsafe to be discharged home alone.  She continues to require SNF placement however it is been difficult to place.  Currently ALF had been reviewing the patient and required a TB skin testing as well as a Covid test which is Negative. ALF was interested in the patient but the patient was not accepted to their ALF due to question of payment if the pateint's Medicaid is denied.  She will remain in the hospital until a safe discharge disposition is obtained.  Consultants:   Neurology  Psychiatry    Procedures: None   Antimicrobials:  Anti-infectives (From admission, onward)   Start     Dose/Rate Route Frequency Ordered Stop   12/14/18 0100  cefTRIAXone (ROCEPHIN) 1 g in sodium chloride 0.9 % 100 mL IVPB  Status:  Discontinued     1 g 200 mL/hr over 30 Minutes  Intravenous Every 24 hours 12/14/18 0010 12/17/18 0948      Objective: Vitals:   04/21/19 2056 04/22/19 0826 04/22/19 1623 04/22/19 2239  BP: (!) 90/53 103/78 118/87 95/60  Pulse: 83 74 100 76  Resp: 14  19 14   Temp: 98.1 F (36.7 C) 98 F (36.7 C) 98.1 F (36.7 C) 98 F (36.7 C)  TempSrc: Oral Oral Oral   SpO2: 98% 100% 100% 100%  Weight:      Height:        Intake/Output Summary (Last 24 hours) at 04/23/2019 0944 Last data filed at 04/22/2019 1300 Gross per 24 hour  Intake 240 ml  Output --  Net 240 ml   Filed Weights   12/15/18 0101 02/22/19 0359  Weight: 98.4 kg 67.1 kg   Examination: Physical Exam:  Constitutional: NAD Vitals:   04/21/19 2056 04/22/19 0826 04/22/19 1623 04/22/19 2239  BP: (!) 90/53 103/78 118/87 95/60  Pulse: 83 74 100 76  Resp: 14  19 14   Temp: 98.1 F (36.7 C) 98 F (36.7 C) 98.1 F (36.7 C) 98 F (36.7 C)  TempSrc: Oral Oral Oral   SpO2: 98% 100% 100% 100%  Weight:      Height:       Data Reviewed: I have personally reviewed following labs and imaging studies  CBC: Recent Labs  Lab 04/20/19 0238  WBC 7.7  HGB 11.8*  HCT 36.3  MCV 86.4  PLT 188   Basic Metabolic Panel: Recent Labs  Lab 04/20/19 0238  NA 142  K 3.7  CL 109  CO2 26  GLUCOSE 91  BUN 13  CREATININE 0.73  CALCIUM 9.1   GFR: Estimated Creatinine Clearance: 71.8 mL/min (by C-G formula based on SCr of 0.73 mg/dL). Liver Function Tests: Recent Labs  Lab 04/20/19 0238  AST 15  ALT 14  ALKPHOS 40  BILITOT 0.7  PROT 6.1*  ALBUMIN 3.0*   No results for input(s): LIPASE, AMYLASE in the last 168 hours. No results for input(s): AMMONIA in the last 168 hours. Coagulation Profile: No results for input(s): INR, PROTIME in the last 168 hours. Cardiac Enzymes: No results for input(s): CKTOTAL, CKMB, CKMBINDEX, TROPONINI in the last 168 hours. BNP (last 3 results) No results for input(s): PROBNP in the last 8760 hours. HbA1C: No results for input(s):  HGBA1C in the last 72 hours. CBG: No results for input(s): GLUCAP in the last 168 hours. Lipid Profile: No results for input(s): CHOL, HDL, LDLCALC, TRIG, CHOLHDL, LDLDIRECT in the last 72 hours. Thyroid Function Tests: No results for input(s): TSH, T4TOTAL, FREET4, T3FREE, THYROIDAB in the last 72 hours. Anemia Panel: No results for input(s): VITAMINB12, FOLATE, FERRITIN, TIBC, IRON, RETICCTPCT in the last 72 hours. Sepsis Labs: No results for input(s): PROCALCITON, LATICACIDVEN in the last 168 hours.  No results found for this or any previous visit (from the past 240 hour(s)).   RN Pressure Injury Documentation:     Estimated body mass index is 23.18 kg/m as calculated from the following:   Height as of this encounter: 5\' 7"  (1.702 m).   Weight as of this encounter: 67.1 kg.  Malnutrition Type:  Nutrition Problem: Increased nutrient needs Etiology: acute illness   Malnutrition Characteristics:  Signs/Symptoms: estimated needs   Nutrition Interventions:  Interventions: Ensure Enlive (each supplement provides 350kcal and 20 grams of protein), MVI  Radiology Studies: No results found.  Scheduled Meds: . Aveeno Treatment  1 packet Topical Daily  . diphenhydrAMINE  25 mg Intravenous Once  . enoxaparin (LOVENOX) injection  40 mg Subcutaneous Q24H  . feeding supplement (ENSURE ENLIVE)  237 mL Oral BID BM  . folic acid  1 mg Oral Daily  . ketoconazole   Topical BID  . loratadine  10 mg Oral Daily  . multivitamin with minerals  1 tablet Oral Daily  . nicotine  14 mg Transdermal Daily  . polyethylene glycol  17 g Oral BID  . senna-docusate  1 tablet Oral BID  . thiamine  100 mg Oral Daily  . triamcinolone 0.1 % cream : eucerin   Topical BID   Continuous Infusions:   LOS: 131 days   [employer], MD [facility] PAGER is on AMION  If 7PM-7AM, please contact night-coverage [email or web address]

## 2019-04-24 NOTE — TOC Progression Note (Addendum)
Transition of Care [facility]) - Progression Note    Patient Details  Name: Marie Woods MRN: [id number] Date of Birth: Jan 31, 1956  [facility] CM/SW Contact  Mearl Latin, LCSW Phone Number: 04/24/2019, 9:42 AM  Clinical Narrative:    CSW continues to follow the patient for safe disposition planning and is hopeful to hear back on the status of patient's Medicaid in April, which hopefully will open up more bed offers if approved.  Referral sent to [facility].  Expected Discharge Plan: Skilled Nursing Facility Barriers to Discharge: No SNF bed, SNF Pending payor source - LOG  Expected Discharge Plan and Services Expected Discharge Plan: Skilled Nursing Facility In-house Referral: Clinical Social Work Discharge Planning Services: NA Post Acute Care Choice: Skilled Nursing Facility Living arrangements for the past 2 months: Apartment                 DME Arranged: N/A DME Agency: NA                   Social Determinants of Health (SDOH) Interventions    Readmission Risk Interventions No flowsheet data found.

## 2019-04-24 NOTE — Progress Notes (Signed)
PROGRESS NOTE    Marie Woods  RDE:[id number] DOB: 27-Mar-1955 DOA: 12/13/2018 PCP: Ladell Pier, MD   Brief Narrative:  The patient is a 64 year old African-American female with a past medical history significant for but not limited to asthma, hypertension, alcohol abuse who originally presented on 12/13/2018 for confusion and altered mental status.  She was found to have a UTI with urine cultures showing multiple species.  MRI of the brain was done and was suggestive of possible Wernicke's encephalopathy. She was treated with thiamine.  She subsequently improved and PT OT recommended SNF placement.  She is unsafe to discharge home given her mental status fluctuating with underlying dementia and suspected difficulty with memory issues.  She will need to be discharged to skilled nursing facility for safe disposition however currently bed is unavailable per social worker and they are in the process of attempting to place the patient but disposition is very difficult. ALF reviewed her case however she was unable to be accepted due to payment issue. Patient does not have a place to go as her apartment had to be let go due to able to hold it any more so she will need to remain in the hospital until a safe discharge disposition is obtained.  Subjective: No complaints  Assessment & Plan:   Principal Problem:   Wernicke encephalopathy Active Problems:   History of alcohol use   TOBACCO ABUSE   Essential hypertension   AMS (altered mental status)   Confusion   Acute metabolic encephalopathy   Agitation   Acute lower UTI   Sepsis (HCC)   Sepsis secondary to UTI (HCC)   Normocytic anemia  Severe sepsis 2/2 UTI: Present on Admission UTI -Urine culture showed multiple species. Repeat culture showed less than 10,000 colonies.  -Was initially treated with IV Rocephin and fluids.  -Subsequently antibiotics have been discontinued. -Sepsis physiology has resolved and she is  hemodynamically stable -Chest x-ray was negative for infection. -Hemodynamically stable  Acute Metabolic Encephalopathy Possible Wernicke's Encephalopathy CT of the head showed mild atrophic changes without acute intracranial abnormality -MRI of the brain: Symmetric diffusion weighted and T2/FLAIR hyperintense signal abnormality within the medial thalami. Findings favor sequelae of Wernicke's encephalopathy -There was question of patient having alcohol abuse however alcohol level less than 10.  -Urine drug screen negative -Completed IV thiamine. Currently on oral thiamine now.  -Continue folic acid, multivitamin. -Outpatient follow-up with Neurology and consider Neuropsychiatry Evaluation -Continue with haloperidol lactate 5 mg IM every 6 hours as needed for Agitation and with lorazepam 1 mg IM every 6 hours as needed for extreme agitation not resolved by the Haldol -Continue with hydroxyzine 25 mg p.o. twice daily as needed for anxiety -Mental status has been fluctuating. Suspect underlying dementia with memory issues.   Essential hypertension -Amlodipine 5 mg po Daily discontinued due to soft BP -BP overall stable  History of Alcohol Abuse -Treated with CIWA protocol.  -Continue folate, thiamine and multivitamin.  -Continue with hydroxyzine as above -Currently not withdrawing   History of Tobacco Abuse -Continue nicotine patch 14 mg Transdermal patch every 24 hours  Generalized Weakness and Deconditioning -PT/OT recommending SNF. Social worker following. -Patient does need supervision as she appears to have short-term memory impairment.   Normocytic Anemia -Patient's hemoglobin/hematocrit was 12.6/38.5 on last check (04/03/19) -Labs stable on 3/18  Chest tightness/shortness of breath and dizziness -Resolved, likely transient  -Was not orthostatic   Hyperglycemia -Mild at 101 on 2/23 but since then has improved and was  96 on 04/03/2019 -Continue to Monitor and  Trend  -Labs stable on 3/18  Constipation -Started Bowel Regimen with Miralax 17 grams po BID and with Senna-Docusate 1 tab po BID  Xerosis/Eczema -Has had extremely dry and flaky skin and she is complaining of itching significantly no evidence of any redness on my evaluation so will not give any Clobetasol 0.5% cream twice daily; Dry skin is improving  -Given IV Benadryl 25 mg x 1 and was scheduling her Claritin 10 mg p.o. daily -We will try topical solutions and have started triamcinolone 1% and Eucerin cream 1:1 mixture -Also have ordered Aveeno soothing bath treatments -Continue with mineral oil-hydrophilic petrolatum topically twice daily as needed for dry skin as well as Hydroscerin cream 2 Applications Topically as Needed fro Dry Skin/Relief/Moisturize and c/w Ketoconazole 2% Cream  DVT prophylaxis: Lovenox 40 mg sq q24h Code Status: FULL CODE Family Communication: No family present at bedside  Disposition Plan: Patient has been admitted from home with acute metabolic encephalopathy and sepsis secondary to urinary tract infection.  Her mental status waxes and wanes therefore she is an unsafe to be discharged home alone.  She continues to require SNF placement however it is been difficult to place.  Currently ALF had been reviewing the patient and required a TB skin testing as well as a Covid test which is Negative. ALF was interested in the patient but the patient was not accepted to their ALF due to question of payment if the pateint's Medicaid is denied.  She will remain in the hospital until a safe discharge disposition is obtained.  Consultants:   Neurology  Psychiatry    Procedures: None   Antimicrobials:  Anti-infectives (From admission, onward)   Start     Dose/Rate Route Frequency Ordered Stop   12/14/18 0100  cefTRIAXone (ROCEPHIN) 1 g in sodium chloride 0.9 % 100 mL IVPB  Status:  Discontinued     1 g 200 mL/hr over 30 Minutes Intravenous Every 24 hours 12/14/18 0010  12/17/18 0948      Objective: Vitals:   04/22/19 1623 04/22/19 2239 04/23/19 1602 04/23/19 2027  BP: 118/87 95/60 117/89 112/79  Pulse: 100 76 80 79  Resp: 19 14  14   Temp: 98.1 F (36.7 C) 98 F (36.7 C) (!) 97.5 F (36.4 C) 97.6 F (36.4 C)  TempSrc: Oral   Oral  SpO2: 100% 100% 100% 100%  Weight:      Height:        Intake/Output Summary (Last 24 hours) at 04/24/2019 1057 Last data filed at 04/24/2019 0817 Gross per 24 hour  Intake 480 ml  Output --  Net 480 ml   Filed Weights   12/15/18 0101 02/22/19 0359  Weight: 98.4 kg 67.1 kg   Examination: Physical Exam:  Constitutional: NAD Vitals:   04/22/19 1623 04/22/19 2239 04/23/19 1602 04/23/19 2027  BP: 118/87 95/60 117/89 112/79  Pulse: 100 76 80 79  Resp: 19 14  14   Temp: 98.1 F (36.7 C) 98 F (36.7 C) (!) 97.5 F (36.4 C) 97.6 F (36.4 C)  TempSrc: Oral   Oral  SpO2: 100% 100% 100% 100%  Weight:      Height:       Data Reviewed: I have personally reviewed following labs and imaging studies  CBC: Recent Labs  Lab 04/20/19 0238  WBC 7.7  HGB 11.8*  HCT 36.3  MCV 86.4  PLT 309   Basic Metabolic Panel: Recent Labs  Lab 04/20/19 0238  NA 142  K 3.7  CL 109  CO2 26  GLUCOSE 91  BUN 13  CREATININE 0.73  CALCIUM 9.1   GFR: Estimated Creatinine Clearance: 71.8 mL/min (by C-G formula based on SCr of 0.73 mg/dL). Liver Function Tests: Recent Labs  Lab 04/20/19 0238  AST 15  ALT 14  ALKPHOS 40  BILITOT 0.7  PROT 6.1*  ALBUMIN 3.0*   No results for input(s): LIPASE, AMYLASE in the last 168 hours. No results for input(s): AMMONIA in the last 168 hours. Coagulation Profile: No results for input(s): INR, PROTIME in the last 168 hours. Cardiac Enzymes: No results for input(s): CKTOTAL, CKMB, CKMBINDEX, TROPONINI in the last 168 hours. BNP (last 3 results) No results for input(s): PROBNP in the last 8760 hours. HbA1C: No results for input(s): HGBA1C in the last 72 hours. CBG: No  results for input(s): GLUCAP in the last 168 hours. Lipid Profile: No results for input(s): CHOL, HDL, LDLCALC, TRIG, CHOLHDL, LDLDIRECT in the last 72 hours. Thyroid Function Tests: No results for input(s): TSH, T4TOTAL, FREET4, T3FREE, THYROIDAB in the last 72 hours. Anemia Panel: No results for input(s): VITAMINB12, FOLATE, FERRITIN, TIBC, IRON, RETICCTPCT in the last 72 hours. Sepsis Labs: No results for input(s): PROCALCITON, LATICACIDVEN in the last 168 hours.  No results found for this or any previous visit (from the past 240 hour(s)).   RN Pressure Injury Documentation:     Estimated body mass index is 23.18 kg/m as calculated from the following:   Height as of this encounter: 5\' 7"  (1.702 m).   Weight as of this encounter: 67.1 kg.  Malnutrition Type:  Nutrition Problem: Increased nutrient needs Etiology: acute illness   Malnutrition Characteristics:  Signs/Symptoms: estimated needs   Nutrition Interventions:  Interventions: Ensure Enlive (each supplement provides 350kcal and 20 grams of protein), MVI  Radiology Studies: No results found.  Scheduled Meds: . Aveeno Treatment  1 packet Topical Daily  . diphenhydrAMINE  25 mg Intravenous Once  . enoxaparin (LOVENOX) injection  40 mg Subcutaneous Q24H  . feeding supplement (ENSURE ENLIVE)  237 mL Oral BID BM  . folic acid  1 mg Oral Daily  . ketoconazole   Topical BID  . loratadine  10 mg Oral Daily  . multivitamin with minerals  1 tablet Oral Daily  . nicotine  14 mg Transdermal Daily  . polyethylene glycol  17 g Oral BID  . senna-docusate  1 tablet Oral BID  . thiamine  100 mg Oral Daily  . triamcinolone 0.1 % cream : eucerin   Topical BID   Continuous Infusions:   LOS: 132 days   [employer], MD [facility] PAGER is on AMION  If 7PM-7AM, please contact night-coverage [email or web address]

## 2019-04-25 NOTE — Progress Notes (Addendum)
PROGRESS NOTE    Marie Woods  GUR:[id number] DOB: 05/15/1955 DOA: 12/13/2018 PCP: Ladell Pier, MD   Brief Narrative:  The patient is a 64 year old African-American female with a past medical history significant for but not limited to asthma, hypertension, alcohol abuse who originally presented on 12/13/2018 for confusion and altered mental status.  She was found to have a UTI with urine cultures showing multiple species.  MRI of the brain was done and was suggestive of possible Wernicke's encephalopathy. She was treated with thiamine.  She subsequently improved and PT OT recommended SNF placement.  She is unsafe to discharge home given her mental status fluctuating with underlying dementia and suspected difficulty with memory issues.  She will need to be discharged to skilled nursing facility for safe disposition however currently bed is unavailable per social worker and they are in the process of attempting to place the patient but disposition is very difficult. ALF reviewed her case however she was unable to be accepted due to payment issue. Patient does not have a place to go as her apartment had to be let go due to able to hold it any more so she will need to remain in the hospital until a safe discharge disposition is obtained.  Subjective: No complaints  Assessment & Plan:   Principal Problem:   Wernicke encephalopathy Active Problems:   History of alcohol use   TOBACCO ABUSE   Essential hypertension   AMS (altered mental status)   Confusion   Acute metabolic encephalopathy   Agitation   Acute lower UTI   Sepsis (HCC)   Sepsis secondary to UTI (HCC)   Normocytic anemia  Severe sepsis 2/2 UTI: Present on Admission UTI -Urine culture showed multiple species. Repeat culture showed less than 10,000 colonies.  -Was initially treated with IV Rocephin and fluids.  -Subsequently antibiotics have been discontinued. -Sepsis physiology has resolved and she is  hemodynamically stable -Chest x-ray was negative for infection. -Hemodynamically stable  Acute Metabolic Encephalopathy Possible Wernicke's Encephalopathy CT of the head showed mild atrophic changes without acute intracranial abnormality -MRI of the brain: Symmetric diffusion weighted and T2/FLAIR hyperintense signal abnormality within the medial thalami. Findings favor sequelae of Wernicke's encephalopathy -There was question of patient having alcohol abuse however alcohol level less than 10.  -Urine drug screen negative -Completed IV thiamine. Currently on oral thiamine now.  -Continue folic acid, multivitamin. -Outpatient follow-up with Neurology and consider Neuropsychiatry Evaluation -Continue with haloperidol lactate 5 mg IM every 6 hours as needed for Agitation and with lorazepam 1 mg IM every 6 hours as needed for extreme agitation not resolved by the Haldol -Continue with hydroxyzine 25 mg p.o. twice daily as needed for anxiety -Mental status has been fluctuating. Suspect underlying dementia with memory issues.   Essential hypertension -Amlodipine 5 mg po Daily discontinued due to soft BP -BP overall stable  History of Alcohol Abuse -Treated with CIWA protocol.  -Continue folate, thiamine and multivitamin.  -Continue with hydroxyzine as above  History of Tobacco Abuse -Continue nicotine patch 14 mg Transdermal patch every 24 hours  Generalized Weakness and Deconditioning -PT/OT recommending SNF. Social worker following. -Patient does need supervision as she appears to have short-term memory impairment.   Normocytic Anemia -Patient's hemoglobin/hematocrit was 12.6/38.5 on last check (04/03/19) -Labs stable on 3/18, repeat 3/24  Chest tightness/shortness of breath and dizziness -Resolved, likely transient  -Was not orthostatic   Hyperglycemia -Mild at 101 on 2/23 but since then has improved and was 96 on  04/03/2019 -Continue to Monitor and Trend  -Labs  stable on 3/18  Constipation -Started Bowel Regimen with Miralax 17 grams po BID and with Senna-Docusate 1 tab po BID  Xerosis/Eczema -Aveeno soothing bath treatments -Continue with mineral oil-hydrophilic petrolatum topically twice daily as needed for dry skin as well as Hydroscerin cream 2 Applications Topically as Needed fro Dry Skin/Relief/Moisturize and c/w Ketoconazole 2% Cream  DVT prophylaxis: Lovenox 40 mg sq q24h Code Status: FULL CODE Family Communication: No family present at bedside  Disposition Plan: Patient has been admitted from home with acute metabolic encephalopathy and sepsis secondary to urinary tract infection.  Her mental status waxes and wanes therefore she is an unsafe to be discharged home alone.  She continues to require SNF placement however it is been difficult to place.  Currently ALF had been reviewing the patient and required a TB skin testing as well as a Covid test which is Negative. ALF was interested in the patient but the patient was not accepted to their ALF due to question of payment if the pateint's Medicaid is denied.  She will remain in the hospital until a safe discharge disposition is obtained.  Consultants:   Neurology  Psychiatry    Procedures: None   Antimicrobials:  Anti-infectives (From admission, onward)   Start     Dose/Rate Route Frequency Ordered Stop   12/14/18 0100  cefTRIAXone (ROCEPHIN) 1 g in sodium chloride 0.9 % 100 mL IVPB  Status:  Discontinued     1 g 200 mL/hr over 30 Minutes Intravenous Every 24 hours 12/14/18 0010 12/17/18 0948      Objective: Vitals:   04/23/19 1602 04/23/19 2027 04/25/19 0014 04/25/19 0700  BP: 117/89 112/79 (!) 91/59 113/75  Pulse: 80 79 74 74  Resp:  14    Temp: (!) 97.5 F (36.4 C) 97.6 F (36.4 C) 98.3 F (36.8 C) 98.6 F (37 C)  TempSrc:  Oral Oral Oral  SpO2: 100% 100% 97% 97%  Weight:      Height:       No intake or output data in the 24 hours ending 04/25/19 1001 Filed  Weights   12/15/18 0101 02/22/19 0359  Weight: 98.4 kg 67.1 kg   Examination: Physical Exam:  Constitutional: no distress Vitals:   04/23/19 1602 04/23/19 2027 04/25/19 0014 04/25/19 0700  BP: 117/89 112/79 (!) 91/59 113/75  Pulse: 80 79 74 74  Resp:  14    Temp: (!) 97.5 F (36.4 C) 97.6 F (36.4 C) 98.3 F (36.8 C) 98.6 F (37 C)  TempSrc:  Oral Oral Oral  SpO2: 100% 100% 97% 97%  Weight:      Height:       Data Reviewed: I have personally reviewed following labs and imaging studies  CBC: Recent Labs  Lab 04/20/19 0238  WBC 7.7  HGB 11.8*  HCT 36.3  MCV 86.4  PLT 309   Basic Metabolic Panel: Recent Labs  Lab 04/20/19 0238  NA 142  K 3.7  CL 109  CO2 26  GLUCOSE 91  BUN 13  CREATININE 0.73  CALCIUM 9.1   GFR: Estimated Creatinine Clearance: 71.8 mL/min (by C-G formula based on SCr of 0.73 mg/dL). Liver Function Tests: Recent Labs  Lab 04/20/19 0238  AST 15  ALT 14  ALKPHOS 40  BILITOT 0.7  PROT 6.1*  ALBUMIN 3.0*   No results for input(s): LIPASE, AMYLASE in the last 168 hours. No results for input(s): AMMONIA in the last 168 hours.  Coagulation Profile: No results for input(s): INR, PROTIME in the last 168 hours. Cardiac Enzymes: No results for input(s): CKTOTAL, CKMB, CKMBINDEX, TROPONINI in the last 168 hours. BNP (last 3 results) No results for input(s): PROBNP in the last 8760 hours. HbA1C: No results for input(s): HGBA1C in the last 72 hours. CBG: No results for input(s): GLUCAP in the last 168 hours. Lipid Profile: No results for input(s): CHOL, HDL, LDLCALC, TRIG, CHOLHDL, LDLDIRECT in the last 72 hours. Thyroid Function Tests: No results for input(s): TSH, T4TOTAL, FREET4, T3FREE, THYROIDAB in the last 72 hours. Anemia Panel: No results for input(s): VITAMINB12, FOLATE, FERRITIN, TIBC, IRON, RETICCTPCT in the last 72 hours. Sepsis Labs: No results for input(s): PROCALCITON, LATICACIDVEN in the last 168 hours.  No results found  for this or any previous visit (from the past 240 hour(s)).   RN Pressure Injury Documentation:     Estimated body mass index is 23.18 kg/m as calculated from the following:   Height as of this encounter: 5\' 7"  (1.702 m).   Weight as of this encounter: 67.1 kg.  Malnutrition Type:  Nutrition Problem: Increased nutrient needs Etiology: acute illness   Malnutrition Characteristics:  Signs/Symptoms: estimated needs   Nutrition Interventions:  Interventions: Ensure Enlive (each supplement provides 350kcal and 20 grams of protein), MVI  Radiology Studies: No results found.  Scheduled Meds: . Aveeno Treatment  1 packet Topical Daily  . diphenhydrAMINE  25 mg Intravenous Once  . enoxaparin (LOVENOX) injection  40 mg Subcutaneous Q24H  . feeding supplement (ENSURE ENLIVE)  237 mL Oral BID BM  . folic acid  1 mg Oral Daily  . ketoconazole   Topical BID  . loratadine  10 mg Oral Daily  . multivitamin with minerals  1 tablet Oral Daily  . nicotine  14 mg Transdermal Daily  . polyethylene glycol  17 g Oral BID  . senna-docusate  1 tablet Oral BID  . thiamine  100 mg Oral Daily  . triamcinolone 0.1 % cream : eucerin   Topical BID   Continuous Infusions:   LOS: 133 days   [employer], MD [facility] PAGER is on AMION  If 7PM-7AM, please contact night-coverage [email or web address]

## 2019-04-26 NOTE — TOC Progression Note (Signed)
Transition of Care [facility]) - Progression Note    Patient Details  Name: TERESSA MCGLOCKLIN MRN: [id number] Date of Birth: Jun 16, 1955  [facility] CM/SW Contact  Mearl Latin, LCSW Phone Number: 04/26/2019, 11:02 AM  Clinical Narrative:    CSW sent referral to:  [facility]  [facility]  [facility] Details Fax CSW Initial Referral  [facility]  [facility]  [facility]    Expected Discharge Plan: Skilled Nursing Facility Barriers to Discharge: No SNF bed, SNF Pending payor source - LOG  Expected Discharge Plan and Services Expected Discharge Plan: Skilled Nursing Facility In-house Referral: Clinical Social Work Discharge Planning Services: NA Post Acute Care Choice: Skilled Nursing Facility Living arrangements for the past 2 months: Apartment                 DME Arranged: N/A DME Agency: NA                   Social Determinants of Health (SDOH) Interventions    Readmission Risk Interventions No flowsheet data found.

## 2019-04-26 NOTE — Progress Notes (Signed)
PROGRESS NOTE    Marie Woods  JOA:[id number] DOB: Jun 21, 1955 DOA: 12/13/2018 PCP: Marcine Matar, MD   Brief Narrative:  The patient is a 64 year old African-American female with a past medical history significant for but not limited to asthma, hypertension, alcohol abuse who originally presented on 12/13/2018 for confusion and altered mental status.  She was found to have a UTI with urine cultures showing multiple species.  MRI of the brain was done and was suggestive of possible Wernicke's encephalopathy. She was treated with thiamine.  She subsequently improved and PT OT recommended SNF placement.  She is unsafe to discharge home given her mental status fluctuating with underlying dementia and suspected difficulty with memory issues.  She will need to be discharged to skilled nursing facility for safe disposition however currently bed is unavailable per social worker and they are in the process of attempting to place the patient but disposition is very difficult. ALF reviewed her case however she was unable to be accepted due to payment issue. Patient does not have a place to go as her apartment had to be let go due to able to hold it any more so she will need to remain in the hospital until a safe discharge disposition is obtained.  Subjective: No acute issues or events overnight-headache, fevers, chills, nausea, vomiting, diarrhea, constipation, chest pain, shortness of breath.  Assessment & Plan:   Principal Problem:   Wernicke encephalopathy Active Problems:   History of alcohol use   TOBACCO ABUSE   Essential hypertension   AMS (altered mental status)   Confusion   Acute metabolic encephalopathy   Agitation   Acute lower UTI   Sepsis (HCC)   Sepsis secondary to UTI (HCC)   Normocytic anemia  Severe sepsis 2/2 UTI: Present on Admission -Urine culture showed multiple species. Repeat culture showed less than 10,000 colonies.  -Was initially treated with IV Rocephin and  fluids.  -Subsequently antibiotics have been discontinued. -Sepsis physiology has resolved and she is hemodynamically stable -Chest x-ray was negative for infection. -Hemodynamically stable  Acute Metabolic Encephalopathy, improving Possible Wernicke's Encephalopathy CT of the head showed mild atrophic changes without acute intracranial abnormality -MRI of the brain: Symmetric diffusion weighted and T2/FLAIR hyperintense signal abnormality within the medial thalami. Findings favor sequelae of Wernicke's encephalopathy -There was question of patient having alcohol abuse however alcohol level less than 10.  -Urine drug screen negative -Completed IV thiamine. Currently on oral thiamine now.  -Continue folic acid, multivitamin. -Outpatient follow-up with Neurology and consider Neuropsychiatry Evaluation -Continue with haloperidol lactate 5 mg IM every 6 hours as needed for Agitation and with lorazepam 1 mg IM every 6 hours as needed for extreme agitation not resolved by the Haldol -Continue with hydroxyzine 25 mg p.o. twice daily as needed for anxiety -Mental status has been fluctuating. Suspect underlying dementia with memory issues.   Essential hypertension -Amlodipine 5 mg po Daily discontinued due to soft BP -BP overall stable  History of Alcohol Abuse -Treated with CIWA protocol.  -Continue folate, thiamine and multivitamin.  -Continue with hydroxyzine as above  History of Tobacco Abuse -Continue nicotine patch 14 mg Transdermal patch every 24 hours  Generalized Weakness and Deconditioning -PT/OT recommending SNF. Social worker following. -Patient does need supervision as she appears to have short-term memory impairment.   Normocytic Anemia -Patient's hemoglobin/hematocrit was 12.6/38.5 on last check (04/03/19) -Labs stable on 3/18, repeat 3/24  Chest tightness/shortness of breath and dizziness -Resolved, likely transient  -Was not orthostatic  Hyperglycemia -Mild at 101 on 2/23 but since then has improved and was 96 on 04/03/2019 -Continue to Monitor and Trend  -Labs stable on 3/18  Constipation -Started Bowel Regimen with Miralax 17 grams po BID and with Senna-Docusate 1 tab po BID  Xerosis/Eczema -Aveeno soothing bath treatments -Continue with mineral oil-hydrophilic petrolatum topically twice daily as needed for dry skin as well as Hydroscerin cream 2 Applications Topically as Needed fro Dry Skin/Relief/Moisturize and c/w Ketoconazole 2% Cream  DVT prophylaxis: Lovenox 40 mg sq q24h Code Status: FULL CODE Family Communication: No family available Disposition Plan: Patient has been admitted from home with acute metabolic encephalopathy and sepsis secondary to urinary tract infection.  Her mental status waxes and wanes therefore she is an unsafe to be discharged home alone.  She continues to require SNF placement however it is been difficult to place.  Currently ALF had been reviewing the patient and required a TB skin testing as well as a Covid test which is Negative. ALF was interested in the patient but the patient was not accepted to their ALF due to question of payment if the pateint's Medicaid is denied.  She will remain in the hospital until a safe discharge disposition is obtained.  Consultants:   Neurology  Psychiatry    Procedures: None   Antimicrobials:  Anti-infectives (From admission, onward)   Start     Dose/Rate Route Frequency Ordered Stop   12/14/18 0100  cefTRIAXone (ROCEPHIN) 1 g in sodium chloride 0.9 % 100 mL IVPB  Status:  Discontinued     1 g 200 mL/hr over 30 Minutes Intravenous Every 24 hours 12/14/18 0010 12/17/18 0948      Objective: Vitals:   04/25/19 0014 04/25/19 0700 04/25/19 1500 04/25/19 2015  BP: (!) 91/59 113/75 120/74 120/71  Pulse: 74 74 89 96  Resp:      Temp: 98.3 F (36.8 C) 98.6 F (37 C) 99 F (37.2 C) 98.1 F (36.7 C)  TempSrc: Oral Oral Oral Oral  SpO2: 97% 97%  99% 98%  Weight:      Height:       No intake or output data in the 24 hours ending 04/26/19 0729 Filed Weights   12/15/18 0101 02/22/19 0359  Weight: 98.4 kg 67.1 kg   Examination: Physical Exam:  Constitutional: no distress Vitals:   04/25/19 0014 04/25/19 0700 04/25/19 1500 04/25/19 2015  BP: (!) 91/59 113/75 120/74 120/71  Pulse: 74 74 89 96  Resp:      Temp: 98.3 F (36.8 C) 98.6 F (37 C) 99 F (37.2 C) 98.1 F (36.7 C)  TempSrc: Oral Oral Oral Oral  SpO2: 97% 97% 99% 98%  Weight:      Height:       Data Reviewed: I have personally reviewed following labs and imaging studies  CBC: Recent Labs  Lab 04/20/19 0238  WBC 7.7  HGB 11.8*  HCT 36.3  MCV 86.4  PLT 102   Basic Metabolic Panel: Recent Labs  Lab 04/20/19 0238  NA 142  K 3.7  CL 109  CO2 26  GLUCOSE 91  BUN 13  CREATININE 0.73  CALCIUM 9.1   GFR: Estimated Creatinine Clearance: 71.8 mL/min (by C-G formula based on SCr of 0.73 mg/dL). Liver Function Tests: Recent Labs  Lab 04/20/19 0238  AST 15  ALT 14  ALKPHOS 40  BILITOT 0.7  PROT 6.1*  ALBUMIN 3.0*      Estimated body mass index is 23.18 kg/m as calculated  from the following:   Height as of this encounter: 5\' 7"  (1.702 m).   Weight as of this encounter: 67.1 kg.  Malnutrition Type:  Nutrition Problem: Increased nutrient needs Etiology: acute illness   Malnutrition Characteristics:  Signs/Symptoms: estimated needs   Nutrition Interventions:  Interventions: Ensure Enlive (each supplement provides 350kcal and 20 grams of protein), MVI  Radiology Studies: No results found.  Scheduled Meds: . Aveeno Treatment  1 packet Topical Daily  . diphenhydrAMINE  25 mg Intravenous Once  . enoxaparin (LOVENOX) injection  40 mg Subcutaneous Q24H  . feeding supplement (ENSURE ENLIVE)  237 mL Oral BID BM  . folic acid  1 mg Oral Daily  . ketoconazole   Topical BID  . loratadine  10 mg Oral Daily  . multivitamin  with minerals  1 tablet Oral Daily  . nicotine  14 mg Transdermal Daily  . polyethylene glycol  17 g Oral BID  . senna-docusate  1 tablet Oral BID  . thiamine  100 mg Oral Daily  . triamcinolone 0.1 % cream : eucerin   Topical BID   Continuous Infusions:   LOS: 134 days   [employer], DO [facility] PAGER is on AMION  If 7PM-7AM, please contact night-coverage [email or web address]

## 2019-04-27 NOTE — Progress Notes (Signed)
PROGRESS NOTE    Marie Woods  JOA:[id number] DOB: Jun 21, 1955 DOA: 12/13/2018 PCP: Marcine Matar, MD   Brief Narrative:  The patient is a 64 year old African-American female with a past medical history significant for but not limited to asthma, hypertension, alcohol abuse who originally presented on 12/13/2018 for confusion and altered mental status.  She was found to have a UTI with urine cultures showing multiple species.  MRI of the brain was done and was suggestive of possible Wernicke's encephalopathy. She was treated with thiamine.  She subsequently improved and PT OT recommended SNF placement.  She is unsafe to discharge home given her mental status fluctuating with underlying dementia and suspected difficulty with memory issues.  She will need to be discharged to skilled nursing facility for safe disposition however currently bed is unavailable per social worker and they are in the process of attempting to place the patient but disposition is very difficult. ALF reviewed her case however she was unable to be accepted due to payment issue. Patient does not have a place to go as her apartment had to be let go due to able to hold it any more so she will need to remain in the hospital until a safe discharge disposition is obtained.  Subjective: No acute issues or events overnight-headache, fevers, chills, nausea, vomiting, diarrhea, constipation, chest pain, shortness of breath.  Assessment & Plan:   Principal Problem:   Wernicke encephalopathy Active Problems:   History of alcohol use   TOBACCO ABUSE   Essential hypertension   AMS (altered mental status)   Confusion   Acute metabolic encephalopathy   Agitation   Acute lower UTI   Sepsis (HCC)   Sepsis secondary to UTI (HCC)   Normocytic anemia  Severe sepsis 2/2 UTI: Present on Admission -Urine culture showed multiple species. Repeat culture showed less than 10,000 colonies.  -Was initially treated with IV Rocephin and  fluids.  -Subsequently antibiotics have been discontinued. -Sepsis physiology has resolved and she is hemodynamically stable -Chest x-ray was negative for infection. -Hemodynamically stable  Acute Metabolic Encephalopathy, improving Possible Wernicke's Encephalopathy CT of the head showed mild atrophic changes without acute intracranial abnormality -MRI of the brain: Symmetric diffusion weighted and T2/FLAIR hyperintense signal abnormality within the medial thalami. Findings favor sequelae of Wernicke's encephalopathy -There was question of patient having alcohol abuse however alcohol level less than 10.  -Urine drug screen negative -Completed IV thiamine. Currently on oral thiamine now.  -Continue folic acid, multivitamin. -Outpatient follow-up with Neurology and consider Neuropsychiatry Evaluation -Continue with haloperidol lactate 5 mg IM every 6 hours as needed for Agitation and with lorazepam 1 mg IM every 6 hours as needed for extreme agitation not resolved by the Haldol -Continue with hydroxyzine 25 mg p.o. twice daily as needed for anxiety -Mental status has been fluctuating. Suspect underlying dementia with memory issues.   Essential hypertension -Amlodipine 5 mg po Daily discontinued due to soft BP -BP overall stable  History of Alcohol Abuse -Treated with CIWA protocol.  -Continue folate, thiamine and multivitamin.  -Continue with hydroxyzine as above  History of Tobacco Abuse -Continue nicotine patch 14 mg Transdermal patch every 24 hours  Generalized Weakness and Deconditioning -PT/OT recommending SNF. Social worker following. -Patient does need supervision as she appears to have short-term memory impairment.   Normocytic Anemia -Patient's hemoglobin/hematocrit was 12.6/38.5 on last check (04/03/19) -Labs stable on 3/18, repeat 3/24  Chest tightness/shortness of breath and dizziness -Resolved, likely transient  -Was not orthostatic  Hyperglycemia -Mild at 101 on 2/23 but since then has improved and was 96 on 04/03/2019 -Continue to Monitor and Trend  -Labs stable on 3/18  Constipation -Started Bowel Regimen with Miralax 17 grams po BID and with Senna-Docusate 1 tab po BID  Xerosis/Eczema -Aveeno soothing bath treatments -Continue with mineral oil-hydrophilic petrolatum topically twice daily as needed for dry skin as well as Hydroscerin cream 2 Applications Topically as Needed fro Dry Skin/Relief/Moisturize and c/w Ketoconazole 2% Cream  DVT prophylaxis: Lovenox 40 mg sq q24h Code Status: FULL CODE Family Communication: No family available Disposition Plan: Patient has been admitted from home with acute metabolic encephalopathy and sepsis secondary to urinary tract infection.  Her mental status waxes and wanes therefore she is an unsafe to be discharged home alone.  She continues to require SNF placement however it is been difficult to place.  Currently ALF had been reviewing the patient and required a TB skin testing as well as a Covid test which is Negative. ALF was interested in the patient but the patient was not accepted to their ALF due to question of payment if the pateint's Medicaid is denied.  She will remain in the hospital until a safe discharge disposition is obtained.  Consultants:   Neurology  Psychiatry    Procedures: None   Antimicrobials:  Anti-infectives (From admission, onward)   Start     Dose/Rate Route Frequency Ordered Stop   12/14/18 0100  cefTRIAXone (ROCEPHIN) 1 g in sodium chloride 0.9 % 100 mL IVPB  Status:  Discontinued     1 g 200 mL/hr over 30 Minutes Intravenous Every 24 hours 12/14/18 0010 12/17/18 0948      Objective: Vitals:   04/25/19 2015 04/26/19 0700 04/26/19 1710 04/26/19 2339  BP: 120/71 114/73 113/78 102/66  Pulse: 96 73 79 74  Resp:      Temp: 98.1 F (36.7 C) 98.8 F (37.1 C) 98.4 F (36.9 C) 98 F (36.7 C)  TempSrc: Oral Oral  Oral  SpO2: 98% 97% 99%  98%  Weight:      Height:       No intake or output data in the 24 hours ending 04/27/19 0721 Filed Weights   12/15/18 0101 02/22/19 0359  Weight: 98.4 kg 67.1 kg   Examination: Physical Exam:  Constitutional: no distress Vitals:   04/25/19 2015 04/26/19 0700 04/26/19 1710 04/26/19 2339  BP: 120/71 114/73 113/78 102/66  Pulse: 96 73 79 74  Resp:      Temp: 98.1 F (36.7 C) 98.8 F (37.1 C) 98.4 F (36.9 C) 98 F (36.7 C)  TempSrc: Oral Oral  Oral  SpO2: 98% 97% 99% 98%  Weight:      Height:       Data Reviewed: I have personally reviewed following labs and imaging studies  CBC: No results for input(s): WBC, NEUTROABS, HGB, HCT, MCV, PLT in the last 168 hours. Basic Metabolic Panel: No results for input(s): NA, K, CL, CO2, GLUCOSE, BUN, CREATININE, CALCIUM, MG, PHOS in the last 168 hours. GFR: Estimated Creatinine Clearance: 71.8 mL/min (by C-G formula based on SCr of 0.73 mg/dL). Liver Function Tests: No results for input(s): AST, ALT, ALKPHOS, BILITOT, PROT, ALBUMIN in the last 168 hours.    Estimated body mass index is 23.18 kg/m as calculated from the following:   Height as of this encounter: 5\' 7"  (1.702 m).   Weight as of this encounter: 67.1 kg.  Malnutrition Type:  Nutrition Problem: Increased nutrient needs Etiology: acute  illness   Malnutrition Characteristics:  Signs/Symptoms: estimated needs   Nutrition Interventions:  Interventions: Ensure Enlive (each supplement provides 350kcal and 20 grams of protein), MVI  Radiology Studies: No results found.  Scheduled Meds: . Aveeno [employer] Treatment  1 packet Topical Daily  . diphenhydrAMINE  25 mg Intravenous Once  . enoxaparin (LOVENOX) injection  40 mg Subcutaneous Q24H  . feeding supplement (ENSURE ENLIVE)  237 mL Oral BID BM  . folic acid  1 mg Oral Daily  . ketoconazole   Topical BID  . loratadine  10 mg Oral Daily  . multivitamin with minerals  1 tablet Oral Daily  . nicotine  14  mg Transdermal Daily  . polyethylene glycol  17 g Oral BID  . senna-docusate  1 tablet Oral BID  . thiamine  100 mg Oral Daily  . triamcinolone 0.1 % cream : eucerin   Topical BID   Continuous Infusions:   LOS: 135 days   Azucena Fallen, DO [facility] PAGER is on AMION  If 7PM-7AM, please contact night-coverage [email or web address]

## 2019-04-28 NOTE — Progress Notes (Signed)
PROGRESS NOTE    Marie Woods  TFT:[id number] DOB: 1955-04-28 DOA: 12/13/2018 PCP: Ladell Pier, MD   Brief Narrative:  The patient is a 64 year old African-American female with a past medical history significant for but not limited to asthma, hypertension, alcohol abuse who originally presented on 12/13/2018 for confusion and altered mental status.  She was found to have a UTI with urine cultures showing multiple species.  MRI of the brain was done and was suggestive of possible Wernicke's encephalopathy. She was treated with thiamine.  She subsequently improved and PT OT recommended SNF placement.  She is unsafe to discharge home given her mental status fluctuating with underlying dementia and suspected difficulty with memory issues.  She will need to be discharged to skilled nursing facility for safe disposition however currently bed is unavailable per social worker and they are in the process of attempting to place the patient but disposition is very difficult. ALF reviewed her case however she was unable to be accepted due to payment issue. Patient does not have a place to go as her apartment had to be let go due to able to hold it any more so she will need to remain in the hospital until a safe discharge disposition is obtained.  Subjective: No acute issues or events overnight-headache, fevers, chills, nausea, vomiting, diarrhea, constipation, chest pain, shortness of breath. Resting in bed comfortably - very pleasant.   Assessment & Plan:   Principal Problem:   Wernicke encephalopathy Active Problems:   History of alcohol use   TOBACCO ABUSE   Essential hypertension   AMS (altered mental status)   Confusion   Acute metabolic encephalopathy   Agitation   Acute lower UTI   Sepsis ([place])   Sepsis secondary to UTI (HCC)   Normocytic anemia  Severe sepsis 2/2 UTI: Present on Admission, Resolved -Urine culture showed multiple species. Repeat culture showed less than 10,000  colonies.  -Was initially treated with IV Rocephin and fluids.  -Subsequently antibiotics have been discontinued. -Sepsis physiology has resolved and she is hemodynamically stable -Chest x-ray was negative for infection. -Hemodynamically stable  Acute Metabolic Encephalopathy on likely baseline dementia/wernicke's encephalopathy, stable CT of the head showed mild atrophic changes without acute intracranial abnormality -MRI of the brain: Symmetric diffusion weighted and T2/FLAIR hyperintense signal abnormality within the medial thalami. Findings favor sequelae of Wernicke's encephalopathy -There was question of patient having alcohol abuse however alcohol level less than 10.  -Urine drug screen negative -Completed IV thiamine. Currently on oral thiamine now.  -Continue folic acid, multivitamin. -Outpatient follow-up with Neurology and consider Neuropsychiatry Evaluation -Continue with haloperidol lactate 5 mg IM every 6 hours as needed for Agitation and with lorazepam 1 mg IM every 6 hours as needed for extreme agitation not resolved by the Haldol -Continue with hydroxyzine 25 mg p.o. twice daily as needed for anxiety -Mental status has been fluctuating. Suspect underlying dementia with memory issues.   Essential hypertension -Amlodipine 5 mg po Daily discontinued due to soft BP -BP overall stable  History of Alcohol Abuse -Treated with CIWA protocol.  -Continue folate, thiamine and multivitamin.  -Continue with hydroxyzine as above  History of Tobacco Abuse -Continue nicotine patch 14 mg Transdermal patch every 24 hours  Generalized Weakness and Deconditioning -PT/OT recommending SNF. Social worker following. -Patient does need supervision as she appears to have short-term memory impairment.   Normocytic Anemia -Patient's hemoglobin/hematocrit was 12.6/38.5 on last check (04/03/19) -Labs stable on 3/18, repeat 3/24  Chest tightness/shortness of breath  and  dizziness -Resolved, likely transient  -Was not orthostatic   Hyperglycemia -Mild at 101 on 2/23 but since then has improved and was 96 on 04/03/2019 -Continue to Monitor and Trend  -Labs stable on 3/18  Constipation -Started Bowel Regimen with Miralax 17 grams po BID and with Senna-Docusate 1 tab po BID  Xerosis/Eczema -Aveeno soothing bath treatments -Continue with mineral oil-hydrophilic petrolatum topically twice daily as needed for dry skin as well as Hydroscerin cream 2 Applications Topically as Needed fro Dry Skin/Relief/Moisturize and c/w Ketoconazole 2% Cream  DVT prophylaxis: Lovenox 40 mg sq q24h Code Status: FULL CODE Family Communication: No family available Disposition Plan: Patient has been admitted from home with acute metabolic encephalopathy and sepsis secondary to urinary tract infection.  Her mental status waxes and wanes therefore she is an unsafe to be discharged home alone.  She continues to require SNF placement however it is been difficult to place.  Currently ALF had been reviewing the patient and required a TB skin testing as well as a Covid test which is Negative. ALF was interested in the patient but the patient was not accepted to their ALF due to question of payment if the pateint's Medicaid is denied.  She will remain in the hospital until a safe discharge disposition is obtained.  Consultants:   Neurology  Psychiatry    Procedures: None   Antimicrobials:  Anti-infectives (From admission, onward)   Start     Dose/Rate Route Frequency Ordered Stop   12/14/18 0100  cefTRIAXone (ROCEPHIN) 1 g in sodium chloride 0.9 % 100 mL IVPB  Status:  Discontinued     1 g 200 mL/hr over 30 Minutes Intravenous Every 24 hours 12/14/18 0010 12/17/18 0948      Objective: Vitals:   04/26/19 1710 04/26/19 2339 04/27/19 0822 04/28/19 0200  BP: 113/78 102/66 (!) 85/59 105/81  Pulse: 79 74 70 77  Resp:   16 20  Temp: 98.4 F (36.9 C) 98 F (36.7 C) 98.4 F (36.9  C) 98 F (36.7 C)  TempSrc:  Oral Oral Oral  SpO2: 99% 98% 100% 95%  Weight:      Height:       No intake or output data in the 24 hours ending 04/28/19 0726 Filed Weights   12/15/18 0101 02/22/19 0359  Weight: 98.4 kg 67.1 kg   Examination: Physical Exam:  Constitutional: no distress Vitals:   04/26/19 1710 04/26/19 2339 04/27/19 0822 04/28/19 0200  BP: 113/78 102/66 (!) 85/59 105/81  Pulse: 79 74 70 77  Resp:   16 20  Temp: 98.4 F (36.9 C) 98 F (36.7 C) 98.4 F (36.9 C) 98 F (36.7 C)  TempSrc:  Oral Oral Oral  SpO2: 99% 98% 100% 95%  Weight:      Height:       Data Reviewed: I have personally reviewed following labs and imaging studies  CBC: No results for input(s): WBC, NEUTROABS, HGB, HCT, MCV, PLT in the last 168 hours. Basic Metabolic Panel: No results for input(s): NA, K, CL, CO2, GLUCOSE, BUN, CREATININE, CALCIUM, MG, PHOS in the last 168 hours. GFR: Estimated Creatinine Clearance: 71.8 mL/min (by C-G formula based on SCr of 0.73 mg/dL). Liver Function Tests: No results for input(s): AST, ALT, ALKPHOS, BILITOT, PROT, ALBUMIN in the last 168 hours.    Estimated body mass index is 23.18 kg/m as calculated from the following:   Height as of this encounter: 5\' 7"  (1.702 m).   Weight as of this  encounter: 67.1 kg.  Malnutrition Type:  Nutrition Problem: Increased nutrient needs Etiology: acute illness   Malnutrition Characteristics:  Signs/Symptoms: estimated needs   Nutrition Interventions:  Interventions: Ensure Enlive (each supplement provides 350kcal and 20 grams of protein), MVI  Radiology Studies: No results found.  Scheduled Meds: . Aveeno [employer] Treatment  1 packet Topical Daily  . diphenhydrAMINE  25 mg Intravenous Once  . enoxaparin (LOVENOX) injection  40 mg Subcutaneous Q24H  . feeding supplement (ENSURE ENLIVE)  237 mL Oral BID BM  . folic acid  1 mg Oral Daily  . ketoconazole   Topical BID  . loratadine  10 mg Oral  Daily  . multivitamin with minerals  1 tablet Oral Daily  . nicotine  14 mg Transdermal Daily  . polyethylene glycol  17 g Oral BID  . senna-docusate  1 tablet Oral BID  . thiamine  100 mg Oral Daily  . triamcinolone 0.1 % cream : eucerin   Topical BID   Continuous Infusions:   LOS: 136 days   Azucena Fallen, DO [facility] PAGER is on AMION  If 7PM-7AM, please contact night-coverage [email or web address]

## 2019-04-28 NOTE — TOC Progression Note (Signed)
Transition of Care [facility]) - Progression Note    Patient Details  Name: Marie Woods MRN: [id number] Date of Birth: 04/07/1955  Transition of [facility]) CM/SW Contact  Mearl Latin, LCSW Phone Number: 04/28/2019, 11:16 AM  Clinical Narrative:    Patient continues to have no safe discharge plan. Will continue to follow.    Expected Discharge Plan: Skilled Nursing Facility Barriers to Discharge: No SNF bed, SNF Pending payor source - LOG  Expected Discharge Plan and Services Expected Discharge Plan: Skilled Nursing Facility In-house Referral: Clinical Social Work Discharge Planning Services: NA Post Acute Care Choice: Skilled Nursing Facility Living arrangements for the past 2 months: Apartment                 DME Arranged: N/A DME Agency: NA                   Social Determinants of Health (SDOH) Interventions    Readmission Risk Interventions No flowsheet data found.

## 2019-04-29 NOTE — Progress Notes (Signed)
PROGRESS NOTE    Marie Woods  TFT:[id number] DOB: 1955-04-28 DOA: 12/13/2018 PCP: Ladell Pier, MD   Brief Narrative:  The patient is a 64 year old African-American female with a past medical history significant for but not limited to asthma, hypertension, alcohol abuse who originally presented on 12/13/2018 for confusion and altered mental status.  She was found to have a UTI with urine cultures showing multiple species.  MRI of the brain was done and was suggestive of possible Wernicke's encephalopathy. She was treated with thiamine.  She subsequently improved and PT OT recommended SNF placement.  She is unsafe to discharge home given her mental status fluctuating with underlying dementia and suspected difficulty with memory issues.  She will need to be discharged to skilled nursing facility for safe disposition however currently bed is unavailable per social worker and they are in the process of attempting to place the patient but disposition is very difficult. ALF reviewed her case however she was unable to be accepted due to payment issue. Patient does not have a place to go as her apartment had to be let go due to able to hold it any more so she will need to remain in the hospital until a safe discharge disposition is obtained.  Subjective: No acute issues or events overnight-headache, fevers, chills, nausea, vomiting, diarrhea, constipation, chest pain, shortness of breath. Resting in bed comfortably - very pleasant.   Assessment & Plan:   Principal Problem:   Wernicke encephalopathy Active Problems:   History of alcohol use   TOBACCO ABUSE   Essential hypertension   AMS (altered mental status)   Confusion   Acute metabolic encephalopathy   Agitation   Acute lower UTI   Sepsis ([facility])   Sepsis secondary to UTI ([facility])   Normocytic anemia  Severe sepsis 2/2 UTI: Present on Admission, Resolved -Urine culture showed multiple species. Repeat culture showed less than 10,000  colonies.  -Was initially treated with IV Rocephin and fluids.  -Subsequently antibiotics have been discontinued. -Sepsis physiology has resolved and she is hemodynamically stable -Chest x-ray was negative for infection. -Hemodynamically stable  Acute Metabolic Encephalopathy on likely baseline dementia/wernicke's encephalopathy, stable CT of the head showed mild atrophic changes without acute intracranial abnormality -MRI of the brain: Symmetric diffusion weighted and T2/FLAIR hyperintense signal abnormality within the medial thalami. Findings favor sequelae of Wernicke's encephalopathy -There was question of patient having alcohol abuse however alcohol level less than 10.  -Urine drug screen negative -Completed IV thiamine. Currently on oral thiamine now.  -Continue folic acid, multivitamin. -Outpatient follow-up with Neurology and consider Neuropsychiatry Evaluation -Continue with haloperidol lactate 5 mg IM every 6 hours as needed for Agitation and with lorazepam 1 mg IM every 6 hours as needed for extreme agitation not resolved by the Haldol -Continue with hydroxyzine 25 mg p.o. twice daily as needed for anxiety -Mental status has been fluctuating. Suspect underlying dementia with memory issues.   Essential hypertension -Amlodipine 5 mg po Daily discontinued due to soft BP -BP overall stable  History of Alcohol Abuse -Treated with CIWA protocol.  -Continue folate, thiamine and multivitamin.  -Continue with hydroxyzine as above  History of Tobacco Abuse -Continue nicotine patch 14 mg Transdermal patch every 24 hours  Generalized Weakness and Deconditioning -PT/OT recommending SNF. Social worker following. -Patient does need supervision as she appears to have short-term memory impairment.   Normocytic Anemia -Patient's hemoglobin/hematocrit was 12.6/38.5 on last check (04/03/19) -Labs stable on 3/18, repeat 3/24  Chest tightness/shortness of breath  and  dizziness -Resolved, likely transient  -Was not orthostatic   Hyperglycemia -Mild at 101 on 2/23 but since then has improved and was 96 on 04/03/2019 -Continue to Monitor and Trend  -Labs stable on 3/18  Constipation -Started Bowel Regimen with Miralax 17 grams po BID and with Senna-Docusate 1 tab po BID  Xerosis/Eczema -Aveeno soothing bath treatments -Continue with mineral oil-hydrophilic petrolatum topically twice daily as needed for dry skin as well as Hydroscerin cream 2 Applications Topically as Needed fro Dry Skin/Relief/Moisturize and c/w Ketoconazole 2% Cream  DVT prophylaxis: Lovenox 40 mg sq q24h Code Status: FULL CODE Family Communication: No family available Disposition Plan: Patient has been admitted from home with acute metabolic encephalopathy and sepsis secondary to urinary tract infection.  Her mental status waxes and wanes therefore she is an unsafe to be discharged home alone.  She continues to require SNF placement however it is been difficult to place.  Currently ALF had been reviewing the patient and required a TB skin testing as well as a Covid test which is Negative. ALF was interested in the patient but the patient was not accepted to their ALF due to question of payment if the pateint's Medicaid is denied.  She will remain in the hospital until a safe discharge disposition is obtained.  Consultants:   Neurology  Psychiatry    Procedures: None   Antimicrobials:  Anti-infectives (From admission, onward)   Start     Dose/Rate Route Frequency Ordered Stop   12/14/18 0100  cefTRIAXone (ROCEPHIN) 1 g in sodium chloride 0.9 % 100 mL IVPB  Status:  Discontinued     1 g 200 mL/hr over 30 Minutes Intravenous Every 24 hours 12/14/18 0010 12/17/18 0948      Objective: Vitals:   04/28/19 0200 04/28/19 0901 04/28/19 1703 04/28/19 2134  BP: 105/81 95/66 136/88 113/81  Pulse: 77 79 61 79  Resp: 20 17 18 14   Temp: 98 F (36.7 C) 97.9 F (36.6 C) 98.3 F (36.8  C) 97.6 F (36.4 C)  TempSrc: Oral  Oral Oral  SpO2: 95% 99% 98% 98%  Weight:      Height:       No intake or output data in the 24 hours ending 04/29/19 0706 Filed Weights   12/15/18 0101 02/22/19 0359  Weight: 98.4 kg 67.1 kg   Examination: Physical Exam:  Constitutional: no distress Vitals:   04/28/19 0200 04/28/19 0901 04/28/19 1703 04/28/19 2134  BP: 105/81 95/66 136/88 113/81  Pulse: 77 79 61 79  Resp: 20 17 18 14   Temp: 98 F (36.7 C) 97.9 F (36.6 C) 98.3 F (36.8 C) 97.6 F (36.4 C)  TempSrc: Oral  Oral Oral  SpO2: 95% 99% 98% 98%  Weight:      Height:       Data Reviewed: I have personally reviewed following labs and imaging studies  CBC: No results for input(s): WBC, NEUTROABS, HGB, HCT, MCV, PLT in the last 168 hours. Basic Metabolic Panel: No results for input(s): NA, K, CL, CO2, GLUCOSE, BUN, CREATININE, CALCIUM, MG, PHOS in the last 168 hours. GFR: Estimated Creatinine Clearance: 71.8 mL/min (by C-G formula based on SCr of 0.73 mg/dL). Liver Function Tests: No results for input(s): AST, ALT, ALKPHOS, BILITOT, PROT, ALBUMIN in the last 168 hours.    Estimated body mass index is 23.18 kg/m as calculated from the following:   Height as of this encounter: 5\' 7"  (1.702 m).   Weight as of this encounter: 67.1  kg.  Malnutrition Type:  Nutrition Problem: Increased nutrient needs Etiology: acute illness   Malnutrition Characteristics:  Signs/Symptoms: estimated needs   Nutrition Interventions:  Interventions: Ensure Enlive (each supplement provides 350kcal and 20 grams of protein), MVI  Radiology Studies: No results found.  Scheduled Meds: . Aveeno [employer] Treatment  1 packet Topical Daily  . diphenhydrAMINE  25 mg Intravenous Once  . enoxaparin (LOVENOX) injection  40 mg Subcutaneous Q24H  . feeding supplement (ENSURE ENLIVE)  237 mL Oral BID BM  . folic acid  1 mg Oral Daily  . ketoconazole   Topical BID  . loratadine  10 mg Oral  Daily  . multivitamin with minerals  1 tablet Oral Daily  . nicotine  14 mg Transdermal Daily  . polyethylene glycol  17 g Oral BID  . senna-docusate  1 tablet Oral BID  . thiamine  100 mg Oral Daily  . triamcinolone 0.1 % cream : eucerin   Topical BID   Continuous Infusions:   LOS: 137 days   Little Ishikawa, DO [facility] PAGER is on [place]  If 7PM-7AM, please contact night-coverage [email or web address]

## 2019-04-30 NOTE — Progress Notes (Signed)
PROGRESS NOTE    Marie Woods  TFT:[id number] DOB: 1955-04-28 DOA: 12/13/2018 PCP: Ladell Pier, MD   Brief Narrative:  The patient is a 64 year old African-American female with a past medical history significant for but not limited to asthma, hypertension, alcohol abuse who originally presented on 12/13/2018 for confusion and altered mental status.  She was found to have a UTI with urine cultures showing multiple species.  MRI of the brain was done and was suggestive of possible Wernicke's encephalopathy. She was treated with thiamine.  She subsequently improved and PT OT recommended SNF placement.  She is unsafe to discharge home given her mental status fluctuating with underlying dementia and suspected difficulty with memory issues.  She will need to be discharged to skilled nursing facility for safe disposition however currently bed is unavailable per social worker and they are in the process of attempting to place the patient but disposition is very difficult. ALF reviewed her case however she was unable to be accepted due to payment issue. Patient does not have a place to go as her apartment had to be let go due to able to hold it any more so she will need to remain in the hospital until a safe discharge disposition is obtained.  Subjective: No acute issues or events overnight-headache, fevers, chills, nausea, vomiting, diarrhea, constipation, chest pain, shortness of breath. Resting in bed comfortably - very pleasant.   Assessment & Plan:   Principal Problem:   Wernicke encephalopathy Active Problems:   History of alcohol use   TOBACCO ABUSE   Essential hypertension   AMS (altered mental status)   Confusion   Acute metabolic encephalopathy   Agitation   Acute lower UTI   Sepsis ([facility])   Sepsis secondary to UTI ([facility])   Normocytic anemia  Severe sepsis 2/2 UTI: Present on Admission, Resolved -Urine culture showed multiple species. Repeat culture showed less than 10,000  colonies.  -Was initially treated with IV Rocephin and fluids.  -Subsequently antibiotics have been discontinued. -Sepsis physiology has resolved and she is hemodynamically stable -Chest x-ray was negative for infection. -Hemodynamically stable  Acute Metabolic Encephalopathy on likely baseline dementia/wernicke's encephalopathy, stable CT of the head showed mild atrophic changes without acute intracranial abnormality -MRI of the brain: Symmetric diffusion weighted and T2/FLAIR hyperintense signal abnormality within the medial thalami. Findings favor sequelae of Wernicke's encephalopathy -There was question of patient having alcohol abuse however alcohol level less than 10.  -Urine drug screen negative -Completed IV thiamine. Currently on oral thiamine now.  -Continue folic acid, multivitamin. -Outpatient follow-up with Neurology and consider Neuropsychiatry Evaluation -Continue with haloperidol lactate 5 mg IM every 6 hours as needed for Agitation and with lorazepam 1 mg IM every 6 hours as needed for extreme agitation not resolved by the Haldol -Continue with hydroxyzine 25 mg p.o. twice daily as needed for anxiety -Mental status has been fluctuating. Suspect underlying dementia with memory issues.   Essential hypertension -Amlodipine 5 mg po Daily discontinued due to soft BP -BP overall stable  History of Alcohol Abuse -Treated with CIWA protocol.  -Continue folate, thiamine and multivitamin.  -Continue with hydroxyzine as above  History of Tobacco Abuse -Continue nicotine patch 14 mg Transdermal patch every 24 hours  Generalized Weakness and Deconditioning -PT/OT recommending SNF. Social worker following. -Patient does need supervision as she appears to have short-term memory impairment.   Normocytic Anemia -Patient's hemoglobin/hematocrit was 12.6/38.5 on last check (04/03/19) -Labs stable on 3/18, repeat 3/24  Chest tightness/shortness of breath  and  dizziness -Resolved, likely transient  -Was not orthostatic   Hyperglycemia -Mild at 101 on 2/23 but since then has improved and was 96 on 04/03/2019 -Continue to Monitor and Trend  -Labs stable on 3/18  Constipation -Started Bowel Regimen with Miralax 17 grams po BID and with Senna-Docusate 1 tab po BID  Xerosis/Eczema -Aveeno soothing bath treatments -Continue with mineral oil-hydrophilic petrolatum topically twice daily as needed for dry skin as well as Hydroscerin cream 2 Applications Topically as Needed fro Dry Skin/Relief/Moisturize and c/w Ketoconazole 2% Cream  DVT prophylaxis: Lovenox 40 mg sq q24h Code Status: FULL CODE Family Communication: No family available Disposition Plan: Patient has been admitted from home with acute metabolic encephalopathy and sepsis secondary to urinary tract infection.  Her mental status waxes and wanes therefore she is an unsafe to be discharged home alone.  She continues to require SNF placement however it is been difficult to place.  Currently ALF had been reviewing the patient and required a TB skin testing as well as a Covid test which is Negative. ALF was interested in the patient but the patient was not accepted to their ALF due to question of payment if the pateint's Medicaid is denied.  She will remain in the hospital until a safe discharge disposition is obtained.  Consultants:   Neurology  Psychiatry    Procedures: None   Antimicrobials:  Anti-infectives (From admission, onward)   Start     Dose/Rate Route Frequency Ordered Stop   12/14/18 0100  cefTRIAXone (ROCEPHIN) 1 g in sodium chloride 0.9 % 100 mL IVPB  Status:  Discontinued     1 g 200 mL/hr over 30 Minutes Intravenous Every 24 hours 12/14/18 0010 12/17/18 0948      Objective: Vitals:   04/28/19 2134 04/29/19 0907 04/29/19 1704 04/29/19 2211  BP: 113/81 101/68 98/69 (!) 102/58  Pulse: 79 69 76 68  Resp: 14 16 16 16   Temp: 97.6 F (36.4 C) 97.9 F (36.6 C) 98.1 F  (36.7 C) 98 F (36.7 C)  TempSrc: Oral   Oral  SpO2: 98% 96% 98% 98%  Weight:      Height:       No intake or output data in the 24 hours ending 04/30/19 0717 Filed Weights   12/15/18 0101 02/22/19 0359  Weight: 98.4 kg 67.1 kg   Examination: Physical Exam:  Constitutional: no distress Vitals:   04/28/19 2134 04/29/19 0907 04/29/19 1704 04/29/19 2211  BP: 113/81 101/68 98/69 (!) 102/58  Pulse: 79 69 76 68  Resp: 14 16 16 16   Temp: 97.6 F (36.4 C) 97.9 F (36.6 C) 98.1 F (36.7 C) 98 F (36.7 C)  TempSrc: Oral   Oral  SpO2: 98% 96% 98% 98%  Weight:      Height:       Data Reviewed: I have personally reviewed following labs and imaging studies  CBC: No results for input(s): WBC, NEUTROABS, HGB, HCT, MCV, PLT in the last 168 hours. Basic Metabolic Panel: No results for input(s): NA, K, CL, CO2, GLUCOSE, BUN, CREATININE, CALCIUM, MG, PHOS in the last 168 hours. GFR: Estimated Creatinine Clearance: 71.8 mL/min (by C-G formula based on SCr of 0.73 mg/dL). Liver Function Tests: No results for input(s): AST, ALT, ALKPHOS, BILITOT, PROT, ALBUMIN in the last 168 hours.    Estimated body mass index is 23.18 kg/m as calculated from the following:   Height as of this encounter: 5\' 7"  (1.702 m).   Weight as of this  encounter: 67.1 kg.  Malnutrition Type:  Nutrition Problem: Increased nutrient needs Etiology: acute illness   Malnutrition Characteristics:  Signs/Symptoms: estimated needs   Nutrition Interventions:  Interventions: Ensure Enlive (each supplement provides 350kcal and 20 grams of protein), MVI  Radiology Studies: No results found.  Scheduled Meds: . Aveeno [employer] Treatment  1 packet Topical Daily  . diphenhydrAMINE  25 mg Intravenous Once  . enoxaparin (LOVENOX) injection  40 mg Subcutaneous Q24H  . feeding supplement (ENSURE ENLIVE)  237 mL Oral BID BM  . folic acid  1 mg Oral Daily  . ketoconazole   Topical BID  . loratadine  10 mg Oral  Daily  . multivitamin with minerals  1 tablet Oral Daily  . nicotine  14 mg Transdermal Daily  . polyethylene glycol  17 g Oral BID  . senna-docusate  1 tablet Oral BID  . thiamine  100 mg Oral Daily  . triamcinolone 0.1 % cream : eucerin   Topical BID   Continuous Infusions:   LOS: 138 days   Azucena Fallen, DO [facility] PAGER is on [place]  If 7PM-7AM, please contact night-coverage [email or web address]

## 2019-05-01 MED ORDER — LIDOCAINE VISCOUS HCL 2 % MT SOLN: 15.0000 mL | Freq: Once | OROMUCOSAL | Status: DC

## 2019-05-01 MED ORDER — ALUM & MAG HYDROXIDE-SIMETH 200-200-20 MG/5ML PO SUSP: 30.0000 mL | Freq: Once | ORAL | Status: DC

## 2019-05-01 NOTE — Progress Notes (Signed)
PROGRESS NOTE    Marie Woods  DJM:[id number] DOB: 1955/08/23 DOA: 12/13/2018 PCP: Marcine Matar, MD   Brief Narrative:  The patient is a 64 year old African-American female with a past medical history significant for but not limited to asthma, hypertension, alcohol abuse who originally presented on 12/13/2018 for confusion and altered mental status.  She was found to have a UTI with urine cultures showing multiple species.  MRI of the brain was done and was suggestive of possible Wernicke's encephalopathy. She was treated with thiamine.  She subsequently improved and PT OT recommended SNF placement.  She is unsafe to discharge home given her mental status fluctuating with underlying dementia and suspected difficulty with memory issues.  She will need to be discharged to skilled nursing facility for safe disposition however currently bed is unavailable per social worker and they are in the process of attempting to place the patient but disposition is very difficult. ALF reviewed her case however she was unable to be accepted due to payment issue. Patient does not have a place to go as her apartment had to be let go due to able to hold it any more so she will need to remain in the hospital until a safe discharge disposition is obtained.  Subjective: No acute issues or events overnight denies nausea, vomiting, headache, fevers, chills, shortness of breath, chest pain.  Assessment & Plan:   Principal Problem:   Wernicke encephalopathy Active Problems:   History of alcohol use   TOBACCO ABUSE   Essential hypertension   AMS (altered mental status)   Confusion   Acute metabolic encephalopathy   Agitation   Acute lower UTI   Sepsis (HCC)   Sepsis secondary to UTI (HCC)   Normocytic anemia  Severe sepsis 2/2 UTI: Present on Admission, Resolved -Urine culture showed multiple species. Repeat culture showed less than 10,000 colonies.  -Was initially treated with IV Rocephin and fluids.   -Subsequently antibiotics have been discontinued. -Sepsis physiology has resolved and she is hemodynamically stable -Chest x-ray was negative for infection. -Hemodynamically stable  Acute Metabolic Encephalopathy on likely baseline dementia/wernicke's encephalopathy, stable CT of the head showed mild atrophic changes without acute intracranial abnormality -MRI of the brain: Symmetric diffusion weighted and T2/FLAIR hyperintense signal abnormality within the medial thalami. Findings favor sequelae of Wernicke's encephalopathy -There was question of patient having alcohol abuse however alcohol level less than 10.  -Urine drug screen negative -Completed IV thiamine. Currently on oral thiamine now.  -Continue folic acid, multivitamin. -Outpatient follow-up with Neurology and consider Neuropsychiatry Evaluation -Continue with haloperidol lactate 5 mg IM every 6 hours as needed for Agitation and with lorazepam 1 mg IM every 6 hours as needed for extreme agitation not resolved by the Haldol -Continue with hydroxyzine 25 mg p.o. twice daily as needed for anxiety -Mental status has been fluctuating. Suspect underlying dementia with memory issues.   Essential hypertension -Amlodipine 5 mg po Daily discontinued due to soft BP -BP overall stable  History of Alcohol Abuse -Treated with CIWA protocol.  -Continue folate, thiamine and multivitamin.  -Continue with hydroxyzine as above  History of Tobacco Abuse -Continue nicotine patch 14 mg Transdermal patch every 24 hours  Generalized Weakness and Deconditioning -PT/OT recommending SNF. Social worker following. -Patient does need supervision as she appears to have short-term memory impairment.   Normocytic Anemia -Patient's hemoglobin/hematocrit was 12.6/38.5 on last check (04/03/19) -Labs stable on 3/18, repeat 3/24  Chest tightness/shortness of breath and dizziness -Resolved, likely transient  -Was not  orthostatic    Hyperglycemia -Mild at 101 on 2/23 but since then has improved and was 96 on 04/03/2019 -Continue to Monitor and Trend  -Labs stable on 3/18  Constipation -Started Bowel Regimen with Miralax 17 grams po BID and with Senna-Docusate 1 tab po BID  Xerosis/Eczema -Aveeno soothing bath treatments -Continue with mineral oil-hydrophilic petrolatum topically twice daily as needed for dry skin as well as Hydroscerin cream 2 Applications Topically as Needed fro Dry Skin/Relief/Moisturize and c/w Ketoconazole 2% Cream  DVT prophylaxis: Lovenox 40 mg sq q24h Code Status: FULL CODE Family Communication: No family available Disposition Plan: Patient has been admitted from home with acute metabolic encephalopathy and sepsis secondary to urinary tract infection.  Her mental status waxes and wanes therefore she is an unsafe to be discharged home alone.  She continues to require SNF placement however it is been difficult to place.  Currently ALF had been reviewing the patient and required a TB skin testing as well as a Covid test which is Negative. ALF was interested in the patient but the patient was not accepted to their ALF due to question of payment if the pateint's Medicaid is denied.  She will remain in the hospital until a safe discharge disposition is obtained.  Consultants:   Neurology  Psychiatry    Procedures: None   Antimicrobials:  Anti-infectives (From admission, onward)   Start     Dose/Rate Route Frequency Ordered Stop   12/14/18 0100  cefTRIAXone (ROCEPHIN) 1 g in sodium chloride 0.9 % 100 mL IVPB  Status:  Discontinued     1 g 200 mL/hr over 30 Minutes Intravenous Every 24 hours 12/14/18 0010 12/17/18 0948      Objective: Vitals:   04/29/19 1704 04/29/19 2211 04/30/19 0842 04/30/19 1636  BP: 98/69 (!) 102/58 (!) 88/52 109/77  Pulse: 76 68 75 88  Resp: 16 16 16 18   Temp: 98.1 F (36.7 C) 98 F (36.7 C) 98.9 F (37.2 C) 97.9 F (36.6 C)  TempSrc:  Oral    SpO2: 98%  98% 96% 98%  Weight:      Height:       No intake or output data in the 24 hours ending 05/01/19 0714 Filed Weights   12/15/18 0101 02/22/19 0359  Weight: 98.4 kg 67.1 kg   Examination: Physical Exam:  General:  Pleasantly resting in bed, No acute distress. HEENT:  Normocephalic atraumatic.  Sclerae nonicteric, noninjected.  Extraocular movements intact bilaterally. Neck:  Without mass or deformity.  Trachea is midline. Lungs:  Clear to auscultate bilaterally without rhonchi, wheeze, or rales. Heart:  Regular rate and rhythm.  Without murmurs, rubs, or gallops. Abdomen:  Soft, nontender, nondistended.  Without guarding or rebound. Extremities: Without cyanosis, clubbing, edema, or obvious deformity.  Vitals:   04/29/19 1704 04/29/19 2211 04/30/19 0842 04/30/19 1636  BP: 98/69 (!) 102/58 (!) 88/52 109/77  Pulse: 76 68 75 88  Resp: 16 16 16 18   Temp: 98.1 F (36.7 C) 98 F (36.7 C) 98.9 F (37.2 C) 97.9 F (36.6 C)  TempSrc:  Oral    SpO2: 98% 98% 96% 98%  Weight:      Height:       Data Reviewed: I have personally reviewed following labs and imaging studies  CBC: No results for input(s): WBC, NEUTROABS, HGB, HCT, MCV, PLT in the last 168 hours. Basic Metabolic Panel: No results for input(s): NA, K, CL, CO2, GLUCOSE, BUN, CREATININE, CALCIUM, MG, PHOS in the last 168 hours.  GFR: Estimated Creatinine Clearance: 71.8 mL/min (by C-G formula based on SCr of 0.73 mg/dL). Liver Function Tests: No results for input(s): AST, ALT, ALKPHOS, BILITOT, PROT, ALBUMIN in the last 168 hours.    Estimated body mass index is 23.18 kg/m as calculated from the following:   Height as of this encounter: 5\' 7"  (1.702 m).   Weight as of this encounter: 67.1 kg.  Malnutrition Type:  Nutrition Problem: Increased nutrient needs Etiology: acute illness   Malnutrition Characteristics:  Signs/Symptoms: estimated needs   Nutrition Interventions:  Interventions: Ensure Enlive (each  supplement provides 350kcal and 20 grams of protein), MVI  Radiology Studies: No results found.  Scheduled Meds: . Aveeno [employer] Treatment  1 packet Topical Daily  . diphenhydrAMINE  25 mg Intravenous Once  . enoxaparin (LOVENOX) injection  40 mg Subcutaneous Q24H  . feeding supplement (ENSURE ENLIVE)  237 mL Oral BID BM  . folic acid  1 mg Oral Daily  . ketoconazole   Topical BID  . loratadine  10 mg Oral Daily  . multivitamin with minerals  1 tablet Oral Daily  . nicotine  14 mg Transdermal Daily  . polyethylene glycol  17 g Oral BID  . senna-docusate  1 tablet Oral BID  . thiamine  100 mg Oral Daily  . triamcinolone 0.1 % cream : eucerin   Topical BID   Continuous Infusions:   LOS: 139 days   Little Ishikawa, DO [facility] PAGER is on [place]  If 7PM-7AM, please contact night-coverage [email or web address]

## 2019-05-01 NOTE — TOC Progression Note (Addendum)
Transition of Care [facility]) - Progression Note    Patient Details  Name: Marie Woods MRN: [id number] Date of Birth: [birth year]  Transition of Care [facility]) CM/SW Contact  Mearl Latin, LCSW Phone Number: 05/01/2019, 4:19 PM  Clinical Narrative:    Patient continues to have no safe discharge plan. Sent referral to [employer]. Will continue to follow.    Expected Discharge Plan: Skilled Nursing Facility Barriers to Discharge: No SNF bed, SNF Pending payor source - LOG  Expected Discharge Plan and Services Expected Discharge Plan: Skilled Nursing Facility In-house Referral: Clinical Social Work Discharge Planning Services: NA Post Acute Care Choice: Skilled Nursing Facility Living arrangements for the past 2 months: Apartment                 DME Arranged: N/A DME Agency: NA                   Social Determinants of Health (SDOH) Interventions    Readmission Risk Interventions No flowsheet data found.

## 2019-05-01 NOTE — Progress Notes (Signed)
Pt requesting medicine for heartburn and indigestion.  Pt refusing mylanta.  Tums administered with no relief.  Orders received for gi cocktail.  Pt refusing at this time, will call me after dinner if needing it. Will continue to monitor.

## 2019-05-02 NOTE — Progress Notes (Signed)
PROGRESS NOTE    Marie Woods  DJM:[id number] DOB: 1955/08/23 DOA: 12/13/2018 PCP: Marcine Matar, MD   Brief Narrative:  The patient is a 64 year old African-American female with a past medical history significant for but not limited to asthma, hypertension, alcohol abuse who originally presented on 12/13/2018 for confusion and altered mental status.  She was found to have a UTI with urine cultures showing multiple species.  MRI of the brain was done and was suggestive of possible Wernicke's encephalopathy. She was treated with thiamine.  She subsequently improved and PT OT recommended SNF placement.  She is unsafe to discharge home given her mental status fluctuating with underlying dementia and suspected difficulty with memory issues.  She will need to be discharged to skilled nursing facility for safe disposition however currently bed is unavailable per social worker and they are in the process of attempting to place the patient but disposition is very difficult. ALF reviewed her case however she was unable to be accepted due to payment issue. Patient does not have a place to go as her apartment had to be let go due to able to hold it any more so she will need to remain in the hospital until a safe discharge disposition is obtained.  Subjective: No acute issues or events overnight denies nausea, vomiting, headache, fevers, chills, shortness of breath, chest pain.  Assessment & Plan:   Principal Problem:   Wernicke encephalopathy Active Problems:   History of alcohol use   TOBACCO ABUSE   Essential hypertension   AMS (altered mental status)   Confusion   Acute metabolic encephalopathy   Agitation   Acute lower UTI   Sepsis (HCC)   Sepsis secondary to UTI (HCC)   Normocytic anemia  Severe sepsis 2/2 UTI: Present on Admission, Resolved -Urine culture showed multiple species. Repeat culture showed less than 10,000 colonies.  -Was initially treated with IV Rocephin and fluids.   -Subsequently antibiotics have been discontinued. -Sepsis physiology has resolved and she is hemodynamically stable -Chest x-ray was negative for infection. -Hemodynamically stable  Acute Metabolic Encephalopathy on likely baseline dementia/wernicke's encephalopathy, stable CT of the head showed mild atrophic changes without acute intracranial abnormality -MRI of the brain: Symmetric diffusion weighted and T2/FLAIR hyperintense signal abnormality within the medial thalami. Findings favor sequelae of Wernicke's encephalopathy -There was question of patient having alcohol abuse however alcohol level less than 10.  -Urine drug screen negative -Completed IV thiamine. Currently on oral thiamine now.  -Continue folic acid, multivitamin. -Outpatient follow-up with Neurology and consider Neuropsychiatry Evaluation -Continue with haloperidol lactate 5 mg IM every 6 hours as needed for Agitation and with lorazepam 1 mg IM every 6 hours as needed for extreme agitation not resolved by the Haldol -Continue with hydroxyzine 25 mg p.o. twice daily as needed for anxiety -Mental status has been fluctuating. Suspect underlying dementia with memory issues.   Essential hypertension -Amlodipine 5 mg po Daily discontinued due to soft BP -BP overall stable  History of Alcohol Abuse -Treated with CIWA protocol.  -Continue folate, thiamine and multivitamin.  -Continue with hydroxyzine as above  History of Tobacco Abuse -Continue nicotine patch 14 mg Transdermal patch every 24 hours  Generalized Weakness and Deconditioning -PT/OT recommending SNF. Social worker following. -Patient does need supervision as she appears to have short-term memory impairment.   Normocytic Anemia -Patient's hemoglobin/hematocrit was 12.6/38.5 on last check (04/03/19) -Labs stable on 3/18, repeat 3/24  Chest tightness/shortness of breath and dizziness -Resolved, likely transient  -Was not  orthostatic    Hyperglycemia -Mild at 101 on 2/23 but since then has improved and was 96 on 04/03/2019 -Continue to Monitor and Trend  -Labs stable on 3/18  Constipation -Started Bowel Regimen with Miralax 17 grams po BID and with Senna-Docusate 1 tab po BID  Xerosis/Eczema -Aveeno soothing bath treatments -Continue with mineral oil-hydrophilic petrolatum topically twice daily as needed for dry skin as well as Hydroscerin cream 2 Applications Topically as Needed fro Dry Skin/Relief/Moisturize and c/w Ketoconazole 2% Cream  DVT prophylaxis: Lovenox 40 mg sq q24h Code Status: FULL CODE Family Communication: No family available Disposition Plan: Patient has been admitted from home with acute metabolic encephalopathy and sepsis secondary to urinary tract infection.  Her mental status waxes and wanes therefore she is an unsafe to be discharged home alone.  She continues to require SNF placement however it is been difficult to place.  Currently ALF had been reviewing the patient and required a TB skin testing as well as a Covid test which is Negative. ALF was interested in the patient but the patient was not accepted to their ALF due to question of payment if the pateint's Medicaid is denied.  She will remain in the hospital until a safe discharge disposition is obtained.  Consultants:   Neurology  Psychiatry    Procedures: None   Antimicrobials:  Anti-infectives (From admission, onward)   Start     Dose/Rate Route Frequency Ordered Stop   12/14/18 0100  cefTRIAXone (ROCEPHIN) 1 g in sodium chloride 0.9 % 100 mL IVPB  Status:  Discontinued     1 g 200 mL/hr over 30 Minutes Intravenous Every 24 hours 12/14/18 0010 12/17/18 0948      Objective: Vitals:   04/30/19 1636 05/01/19 0752 05/01/19 2233 05/02/19 0044  BP: 109/77 111/72 (!) 88/65 91/64  Pulse: 88 67 67   Resp: 18 18    Temp: 97.9 F (36.6 C) 98.1 F (36.7 C) 97.8 F (36.6 C)   TempSrc:  Oral Oral   SpO2: 98% 97% 97%   Weight:       Height:        Intake/Output Summary (Last 24 hours) at 05/02/2019 0717 Last data filed at 05/01/2019 1400 Gross per 24 hour  Intake 120 ml  Output --  Net 120 ml   Filed Weights   12/15/18 0101 02/22/19 0359  Weight: 98.4 kg 67.1 kg   Examination: Physical Exam:  General:  Pleasantly resting in bed, No acute distress. HEENT:  Normocephalic atraumatic.  Sclerae nonicteric, noninjected.  Extraocular movements intact bilaterally. Neck:  Without mass or deformity.  Trachea is midline. Lungs:  Clear to auscultate bilaterally without rhonchi, wheeze, or rales. Heart:  Regular rate and rhythm.  Without murmurs, rubs, or gallops. Abdomen:  Soft, nontender, nondistended.  Without guarding or rebound. Extremities: Without cyanosis, clubbing, edema, or obvious deformity.  Vitals:   04/30/19 1636 05/01/19 0752 05/01/19 2233 05/02/19 0044  BP: 109/77 111/72 (!) 88/65 91/64  Pulse: 88 67 67   Resp: 18 18    Temp: 97.9 F (36.6 C) 98.1 F (36.7 C) 97.8 F (36.6 C)   TempSrc:  Oral Oral   SpO2: 98% 97% 97%   Weight:      Height:       Data Reviewed: I have personally reviewed following labs and imaging studies  CBC: No results for input(s): WBC, NEUTROABS, HGB, HCT, MCV, PLT in the last 168 hours. Basic Metabolic Panel: No results for input(s): NA, K, CL,  CO2, GLUCOSE, BUN, CREATININE, CALCIUM, MG, PHOS in the last 168 hours. GFR: Estimated Creatinine Clearance: 71.8 mL/min (by C-G formula based on SCr of 0.73 mg/dL). Liver Function Tests: No results for input(s): AST, ALT, ALKPHOS, BILITOT, PROT, ALBUMIN in the last 168 hours.    Estimated body mass index is 23.18 kg/m as calculated from the following:   Height as of this encounter: 5\' 7"  (1.702 m).   Weight as of this encounter: 67.1 kg.  Malnutrition Type:  Nutrition Problem: Increased nutrient needs Etiology: acute illness   Malnutrition Characteristics:  Signs/Symptoms: estimated needs   Nutrition  Interventions:  Interventions: Ensure Enlive (each supplement provides 350kcal and 20 grams of protein), MVI  Radiology Studies: No results found.  Scheduled Meds: . alum & mag hydroxide-simeth  30 mL Oral Once   And  . lidocaine  15 mL Oral Once  . Aveeno Treatment  1 packet Topical Daily  . diphenhydrAMINE  25 mg Intravenous Once  . enoxaparin (LOVENOX) injection  40 mg Subcutaneous Q24H  . feeding supplement (ENSURE ENLIVE)  237 mL Oral BID BM  . folic acid  1 mg Oral Daily  . ketoconazole   Topical BID  . loratadine  10 mg Oral Daily  . multivitamin with minerals  1 tablet Oral Daily  . nicotine  14 mg Transdermal Daily  . polyethylene glycol  17 g Oral BID  . senna-docusate  1 tablet Oral BID  . thiamine  100 mg Oral Daily  . triamcinolone 0.1 % cream : eucerin   Topical BID   Continuous Infusions:   LOS: 140 days   [employer], DO [facility] PAGER is on [place]  If 7PM-7AM, please contact night-coverage [email or web address]

## 2019-05-03 NOTE — TOC Progression Note (Signed)
Transition of Care [facility]) - Progression Note    Patient Details  Name: Marie Woods MRN: [id number] Date of Birth: 17-May-1955  [facility]) CM/SW Contact  Mearl Latin, LCSW Phone Number: 05/03/2019, 3:06 PM  Clinical Narrative:    CSW sent referral to [facility] for review.    Expected Discharge Plan: Skilled Nursing Facility Barriers to Discharge: No SNF bed, SNF Pending payor source - LOG  Expected Discharge Plan and Services Expected Discharge Plan: Skilled Nursing Facility In-house Referral: Clinical Social Work Discharge Planning Services: NA Post Acute Care Choice: Skilled Nursing Facility Living arrangements for the past 2 months: Apartment                 DME Arranged: N/A DME Agency: NA                   Social Determinants of Health (SDOH) Interventions    Readmission Risk Interventions No flowsheet data found.

## 2019-05-03 NOTE — Progress Notes (Signed)
PROGRESS NOTE    Marie Woods  [id number] DOB: 05/22/1955 DOA: 12/13/2018 PCP: Marcine Matar, MD   Brief Narrative:  The patient is a 64 year old African-American female with a past medical history significant for but not limited to asthma, hypertension, alcohol abuse who originally presented on 12/13/2018 for confusion and altered mental status.  She was found to have a UTI with urine cultures showing multiple species.  MRI of the brain was done and was suggestive of possible Wernicke's encephalopathy. She was treated with thiamine.  She subsequently improved and PT OT recommended SNF placement.  She is unsafe to discharge home given her mental status fluctuating with underlying dementia and suspected difficulty with memory issues.  She will need to be discharged to skilled nursing facility for safe disposition however currently bed is unavailable per social worker and they are in the process of attempting to place the patient but disposition is very difficult. ALF reviewed her case however she was unable to be accepted due to payment issue. Patient does not have a place to go as her apartment had to be let go due to able to hold it any more so she will need to remain in the hospital until a safe discharge disposition is obtained.  Subjective: Patient denies any new complaints.  Patient denies any chest pain, abdominal pain, nausea/vomiting, fever/chills.  Nursing staff requested permission for patient to go outside with supervision for some fresh air as she has been in the hospital for about 141 days  Assessment & Plan:   Principal Problem:   Wernicke encephalopathy Active Problems:   History of alcohol use   TOBACCO ABUSE   Essential hypertension   AMS (altered mental status)   Confusion   Acute metabolic encephalopathy   Agitation   Acute lower UTI   Sepsis (HCC)   Sepsis secondary to UTI (HCC)   Normocytic anemia  Severe sepsis 2/2 UTI: Present on Admission,  Resolved -Urine culture showed multiple species. Repeat culture showed less than 10,000 colonies.  -Was initially treated with IV Rocephin and fluids, subsequently antibiotics have been discontinued  Acute Metabolic Encephalopathy on likely baseline dementia/wernicke's encephalopathy, stable CT of the head showed mild atrophic changes without acute intracranial abnormality -MRI of the brain: Symmetric diffusion weighted and T2/FLAIR hyperintense signal abnormality within the medial thalami. Findings favor sequelae of Wernicke's encephalopathy -There was question of patient having alcohol abuse however alcohol level less than 10.  -Urine drug screen negative -Completed IV thiamine. Currently on oral thiamine now.  -Continue folic acid, multivitamin. -Outpatient follow-up with Neurology and consider Neuropsychiatry Evaluation -Continue with haloperidol lactate 5 mg IM every 6 hours as needed for Agitation and with lorazepam 1 mg IM every 6 hours as needed for extreme agitation not resolved by the Haldol -Continue with hydroxyzine 25 mg p.o. twice daily as needed for anxiety -Suspect underlying dementia with memory issues.   Essential hypertension -Amlodipine 5 mg po Daily discontinued due to soft BP -BP overall stable  History of Alcohol Abuse -Treated with CIWA protocol.  -Continue folate, thiamine and multivitamin.  -Continue with hydroxyzine as above  History of Tobacco Abuse -Continue nicotine patch 14 mg Transdermal patch every 24 hours  Generalized Weakness and Deconditioning -PT/OT recommending SNF. Social worker following. -Patient does need supervision as she appears to have short-term memory impairment.   Xerosis/Eczema -Aveeno soothing bath treatments -Continue with mineral oil-hydrophilic petrolatum topically twice daily as needed for dry skin as well as Hydroscerin cream 2 Applications Topically as  Needed fro Dry Skin/Relief/Moisturize and c/w Ketoconazole 2%  Cream  DVT prophylaxis: Lovenox 40 mg sq q24h Code Status: FULL CODE Family Communication: No family available Disposition Plan: Patient has been admitted from home with acute metabolic encephalopathy and sepsis secondary to urinary tract infection.  Her mental status waxes and wanes therefore she is an unsafe to be discharged home alone.  She continues to require SNF placement however it is been difficult to place.  Currently ALF had been reviewing the patient and required a TB skin testing as well as a Covid test which is Negative. ALF was interested in the patient but the patient was not accepted to their ALF due to question of payment if the pateint's Medicaid is denied.  She will remain in the hospital until a safe discharge disposition is obtained.  TOC on board, continue to work on skilled nursing facility placement  Consultants:   Neurology  Psychiatry    Procedures: None   Antimicrobials:  Anti-infectives (From admission, onward)   Start     Dose/Rate Route Frequency Ordered Stop   12/14/18 0100  cefTRIAXone (ROCEPHIN) 1 g in sodium chloride 0.9 % 100 mL IVPB  Status:  Discontinued     1 g 200 mL/hr over 30 Minutes Intravenous Every 24 hours 12/14/18 0010 12/17/18 0948      Objective: Vitals:   05/02/19 0740 05/02/19 1512 05/02/19 2332 05/03/19 1230  BP: 95/66 111/82 111/77 110/79  Pulse: 65 76 72 79  Resp: 18 16 16 17   Temp: 97.9 F (36.6 C) 98.2 F (36.8 C) 97.7 F (36.5 C) (!) 97.4 F (36.3 C)  TempSrc:   Oral Oral  SpO2: 96% 90% 98%   Weight:      Height:       No intake or output data in the 24 hours ending 05/03/19 1620 Filed Weights   12/15/18 0101 02/22/19 0359  Weight: 98.4 kg 67.1 kg   Examination: Physical Exam:  General: NAD   Cardiovascular: S1, S2 present  Respiratory: CTAB  Abdomen: Soft, nontender, nondistended, bowel sounds present  Musculoskeletal: No bilateral pedal edema noted  Skin: Normal  Psychiatry: Normal  mood    Vitals:   05/02/19 0740 05/02/19 1512 05/02/19 2332 05/03/19 1230  BP: 95/66 111/82 111/77 110/79  Pulse: 65 76 72 79  Resp: 18 16 16 17   Temp: 97.9 F (36.6 C) 98.2 F (36.8 C) 97.7 F (36.5 C) (!) 97.4 F (36.3 C)  TempSrc:   Oral Oral  SpO2: 96% 90% 98%   Weight:      Height:       Data Reviewed: I have personally reviewed following labs and imaging studies  CBC: No results for input(s): WBC, NEUTROABS, HGB, HCT, MCV, PLT in the last 168 hours. Basic Metabolic Panel: No results for input(s): NA, K, CL, CO2, GLUCOSE, BUN, CREATININE, CALCIUM, MG, PHOS in the last 168 hours. GFR: Estimated Creatinine Clearance: 71.8 mL/min (by C-G formula based on SCr of 0.73 mg/dL). Liver Function Tests: No results for input(s): AST, ALT, ALKPHOS, BILITOT, PROT, ALBUMIN in the last 168 hours.    Estimated body mass index is 23.18 kg/m as calculated from the following:   Height as of this encounter: 5\' 7"  (1.702 m).   Weight as of this encounter: 67.1 kg.  Malnutrition Type:  Nutrition Problem: Increased nutrient needs Etiology: acute illness   Malnutrition Characteristics:  Signs/Symptoms: estimated needs   Nutrition Interventions:  Interventions: Ensure Enlive (each supplement provides 350kcal and 20 grams  of protein), MVI  Radiology Studies: No results found.  Scheduled Meds: . alum & mag hydroxide-simeth  30 mL Oral Once   And  . lidocaine  15 mL Oral Once  . Aveeno [employer]ur[employer] Treatment  1 packet Topical Daily  . diphenhydrAMINE  25 mg Intravenous Once  . enoxaparin (LOVENOX) injection  40 mg Subcutaneous Q24H  . feeding supplement (ENSURE ENLIVE)  237 mL Oral BID BM  . folic acid  1 mg Oral Daily  . ketoconazole   Topical BID  . loratadine  10 mg Oral Daily  . multivitamin with minerals  1 tablet Oral Daily  . nicotine  14 mg Transdermal Daily  . polyethylene glycol  17 g Oral BID  . senna-docusate  1 tablet Oral BID  . thiamine  100 mg Oral Daily   . triamcinolone 0.1 % cream : eucerin   Topical BID   Continuous Infusions:   LOS: 141 days   Alma Friendly, MD [facility] PAGER is on [place]  If 7PM-7AM, please contact night-coverage [email or web address]

## 2019-05-04 LAB — CBC WITH DIFFERENTIAL/PLATELET
Abs Immature Granulocytes: 0.04 10*3/uL (ref 0.00–0.07)
Basophils Absolute: 0 10*3/uL (ref 0.0–0.1)
Basophils Relative: 0 %
Eosinophils Absolute: 0.4 10*3/uL (ref 0.0–0.5)
Eosinophils Relative: 4 %
HCT: 41.6 % (ref 36.0–46.0)
Hemoglobin: 13.7 g/dL (ref 12.0–15.0)
Immature Granulocytes: 0 %
Lymphocytes Relative: 32 %
Lymphs Abs: 2.9 10*3/uL (ref 0.7–4.0)
MCH: 28.4 pg (ref 26.0–34.0)
MCHC: 32.9 g/dL (ref 30.0–36.0)
MCV: 86.3 fL (ref 80.0–100.0)
Monocytes Absolute: 1.1 10*3/uL — ABNORMAL HIGH (ref 0.1–1.0)
Monocytes Relative: 12 %
Neutro Abs: 4.7 10*3/uL (ref 1.7–7.7)
Neutrophils Relative %: 52 %
Platelets: 324 10*3/uL (ref 150–400)
RBC: 4.82 MIL/uL (ref 3.87–5.11)
RDW: 14.8 % (ref 11.5–15.5)
WBC: 9 10*3/uL (ref 4.0–10.5)
nRBC: 0 % (ref 0.0–0.2)

## 2019-05-04 LAB — BASIC METABOLIC PANEL
Anion gap: 12 (ref 5–15)
BUN: 15 mg/dL (ref 8–23)
CO2: 25 mmol/L (ref 22–32)
Calcium: 9.8 mg/dL (ref 8.9–10.3)
Chloride: 105 mmol/L (ref 98–111)
Creatinine, Ser: 0.81 mg/dL (ref 0.44–1.00)
GFR calc Af Amer: >60 mL/min (ref >60)
GFR calc non Af Amer: >60 mL/min (ref >60)
Glucose, Bld: 101 mg/dL — ABNORMAL HIGH (ref 70–99)
Potassium: 4.1 mmol/L (ref 3.5–5.1)
Sodium: 142 mmol/L (ref 135–145)

## 2019-05-04 NOTE — Progress Notes (Signed)
PROGRESS NOTE    Marie Woods  [id number] DOB: 1955-05-01 DOA: 12/13/2018 PCP: Marcine Matar, MD   Brief Narrative:  The patient is a 64 year old African-American female with a past medical history significant for but not limited to asthma, hypertension, alcohol abuse who originally presented on 12/13/2018 for confusion and altered mental status.  She was found to have a UTI with urine cultures showing multiple species.  MRI of the brain was done and was suggestive of possible Wernicke's encephalopathy. She was treated with thiamine.  She subsequently improved and PT OT recommended SNF placement.  She is unsafe to discharge home given her mental status fluctuating with underlying dementia and suspected difficulty with memory issues.  She will need to be discharged to skilled nursing facility for safe disposition however currently bed is unavailable per social worker and they are in the process of attempting to place the patient but disposition is very difficult. ALF reviewed her case however she was unable to be accepted due to payment issue. Patient does not have a place to go as her apartment had to be let go due to able to hold it any more so she will need to remain in the hospital until a safe discharge disposition is obtained.   Subjective: Patient seen and examined at bedside, noted to be resting, easily arousable.  Patient denies any new complaints.  Assessment & Plan:   Principal Problem:   Wernicke encephalopathy Active Problems:   History of alcohol use   TOBACCO ABUSE   Essential hypertension   AMS (altered mental status)   Confusion   Acute metabolic encephalopathy   Agitation   Acute lower UTI   Sepsis (HCC)   Sepsis secondary to UTI (HCC)   Normocytic anemia  Severe sepsis 2/2 UTI: Present on Admission, Resolved -Urine culture showed multiple species. Repeat culture showed less than 10,000 colonies.  -Was initially treated with IV Rocephin and fluids,  subsequently antibiotics have been discontinued  Acute Metabolic Encephalopathy on likely baseline dementia/wernicke's encephalopathy, stable CT of the head showed mild atrophic changes without acute intracranial abnormality -MRI of the brain: Symmetric diffusion weighted and T2/FLAIR hyperintense signal abnormality within the medial thalami. Findings favor sequelae of Wernicke's encephalopathy -There was question of patient having alcohol abuse however alcohol level less than 10 -Urine drug screen negative -Completed IV thiamine. Currently on oral thiamine now.  -Continue folic acid, multivitamin. -Outpatient follow-up with Neurology and consider Neuropsychiatry Evaluation -Continue with haloperidol lactate 5 mg IM every 6 hours as needed for Agitation and with lorazepam 1 mg IM every 6 hours as needed for extreme agitation not resolved by the Haldol -Continue with hydroxyzine 25 mg p.o. twice daily as needed for anxiety -Suspect underlying dementia with memory issues.   Essential hypertension -Amlodipine 5 mg po Daily discontinued due to soft BP -BP overall stable  History of Alcohol Abuse -Treated with CIWA protocol.  -Continue folate, thiamine and multivitamin.  -Continue with hydroxyzine as above  History of Tobacco Abuse -Continue nicotine patch 14 mg Transdermal patch every 24 hours  Generalized Weakness and Deconditioning -PT/OT recommending SNF. Social worker following. -Patient does need supervision as she appears to have short-term memory impairment.   Xerosis/Eczema -Aveeno soothing bath treatments -Continue with mineral oil-hydrophilic petrolatum topically twice daily as needed for dry skin as well as Hydroscerin cream 2 Applications Topically as Needed fro Dry Skin/Relief/Moisturize and c/w Ketoconazole 2% Cream  DVT prophylaxis: Lovenox 40 mg sq q24h Code Status: FULL CODE Family Communication: No  family available Disposition Plan: Patient has been  admitted from home with acute metabolic encephalopathy and sepsis secondary to urinary tract infection.  Her mental status waxes and wanes therefore she is an unsafe to be discharged home alone.  She continues to require SNF placement however it is been difficult to place.  Currently ALF had been reviewing the patient and required a TB skin testing as well as a Covid test which is Negative. ALF was interested in the patient but the patient was not accepted to their ALF due to question of payment if the pateint's Medicaid is denied.  She will remain in the hospital until a safe discharge disposition is obtained.  TOC on board, continue to work on skilled nursing facility placement  Consultants:   Neurology  Psychiatry    Procedures: None   Antimicrobials:  Anti-infectives (From admission, onward)   Start     Dose/Rate Route Frequency Ordered Stop   12/14/18 0100  cefTRIAXone (ROCEPHIN) 1 g in sodium chloride 0.9 % 100 mL IVPB  Status:  Discontinued     1 g 200 mL/hr over 30 Minutes Intravenous Every 24 hours 12/14/18 0010 12/17/18 0948      Objective: Vitals:   05/02/19 2332 05/03/19 1230 05/03/19 1720 05/03/19 2211  BP: 111/77 110/79 106/80 120/80  Pulse: 72 79 80 75  Resp: 16 17 16    Temp: 97.7 F (36.5 C) (!) 97.4 F (36.3 C) 97.9 F (36.6 C) 97.7 F (36.5 C)  TempSrc: Oral Oral  Oral  SpO2: 98%  96% 100%  Weight:      Height:       No intake or output data in the 24 hours ending 05/04/19 1603 Filed Weights   12/15/18 0101 02/22/19 0359  Weight: 98.4 kg 67.1 kg   Examination: Physical Exam:  General: NAD   Cardiovascular: S1, S2 present  Respiratory: CTAB  Abdomen: Soft, nontender, nondistended, bowel sounds present  Musculoskeletal: No bilateral pedal edema noted  Skin: Normal  Psychiatry: Normal mood    Vitals:   05/02/19 2332 05/03/19 1230 05/03/19 1720 05/03/19 2211  BP: 111/77 110/79 106/80 120/80  Pulse: 72 79 80 75  Resp: 16 17 16    Temp:  97.7 F (36.5 C) (!) 97.4 F (36.3 C) 97.9 F (36.6 C) 97.7 F (36.5 C)  TempSrc: Oral Oral  Oral  SpO2: 98%  96% 100%  Weight:      Height:       Data Reviewed: I have personally reviewed following labs and imaging studies  CBC: Recent Labs  Lab 05/04/19 0117  WBC 9.0  NEUTROABS 4.7  HGB 13.7  HCT 41.6  MCV 86.3  PLT 324   Basic Metabolic Panel: Recent Labs  Lab 05/04/19 0117  NA 142  K 4.1  CL 105  CO2 25  GLUCOSE 101*  BUN 15  CREATININE 0.81  CALCIUM 9.8   GFR: Estimated Creatinine Clearance: 70.9 mL/min (by C-G formula based on SCr of 0.81 mg/dL). Liver Function Tests: No results for input(s): AST, ALT, ALKPHOS, BILITOT, PROT, ALBUMIN in the last 168 hours.    Estimated body mass index is 23.18 kg/m as calculated from the following:   Height as of this encounter: 5\' 7"  (1.702 m).   Weight as of this encounter: 67.1 kg.  Malnutrition Type:  Nutrition Problem: Increased nutrient needs Etiology: acute illness   Malnutrition Characteristics:  Signs/Symptoms: estimated needs   Nutrition Interventions:  Interventions: Ensure Enlive (each supplement provides 350kcal and 20 grams  of protein), MVI  Radiology Studies: No results found.  Scheduled Meds: . alum & mag hydroxide-simeth  30 mL Oral Once   And  . lidocaine  15 mL Oral Once  . Aveeno [employer]ur[employer] Treatment  1 packet Topical Daily  . diphenhydrAMINE  25 mg Intravenous Once  . enoxaparin (LOVENOX) injection  40 mg Subcutaneous Q24H  . feeding supplement (ENSURE ENLIVE)  237 mL Oral BID BM  . folic acid  1 mg Oral Daily  . ketoconazole   Topical BID  . loratadine  10 mg Oral Daily  . multivitamin with minerals  1 tablet Oral Daily  . nicotine  14 mg Transdermal Daily  . polyethylene glycol  17 g Oral BID  . senna-docusate  1 tablet Oral BID  . thiamine  100 mg Oral Daily  . triamcinolone 0.1 % cream : eucerin   Topical BID   Continuous Infusions:   LOS: 142 days   Alma Friendly, MD [facility] PAGER is on [place]  If 7PM-7AM, please contact night-coverage [email or web address]

## 2019-05-05 NOTE — Progress Notes (Signed)
PROGRESS NOTE    Marie Woods  [id number] DOB: [birth year] DOA: 12/13/2018 PCP: Ladell Pier, MD   Brief Narrative:  The patient is a 64 year old African-American female with a past medical history significant for but not limited to asthma, hypertension, alcohol abuse who originally presented on 12/13/2018 for confusion and altered mental status.  She was found to have a UTI with urine cultures showing multiple species.  MRI of the brain was done and was suggestive of possible Wernicke's encephalopathy. She was treated with thiamine.  She subsequently improved and PT OT recommended SNF placement.  She is unsafe to discharge home given her mental status fluctuating with underlying dementia and suspected difficulty with memory issues.  She will need to be discharged to skilled nursing facility for safe disposition however currently bed is unavailable per social worker and they are in the process of attempting to place the patient but disposition is very difficult. ALF reviewed her case however she was unable to be accepted due to payment issue. Patient does not have a place to go as her apartment had to be let go due to able to hold it any more so she will need to remain in the hospital until a safe discharge disposition is obtained.   Subjective: Met patient resting in bed, easily arousable, denies any new complaints.  Assessment & Plan:   Principal Problem:   Wernicke encephalopathy Active Problems:   History of alcohol use   TOBACCO ABUSE   Essential hypertension   AMS (altered mental status)   Confusion   Acute metabolic encephalopathy   Agitation   Acute lower UTI   Sepsis ([place])   Sepsis secondary to UTI (HCC)   Normocytic anemia  Severe sepsis 2/2 UTI: Present on Admission, Resolved -Urine culture showed multiple species. Repeat culture showed less than 10,000 colonies.  -Was initially treated with IV Rocephin and fluids, subsequently antibiotics have been  discontinued  Acute Metabolic Encephalopathy on likely baseline dementia/wernicke's encephalopathy, stable CT of the head showed mild atrophic changes without acute intracranial abnormality -MRI of the brain: Symmetric diffusion weighted and T2/FLAIR hyperintense signal abnormality within the medial thalami. Findings favor sequelae of Wernicke's encephalopathy -There was question of patient having alcohol abuse however alcohol level less than 10 -Urine drug screen negative -Completed IV thiamine. Currently on oral thiamine now.  -Continue folic acid, multivitamin. -Outpatient follow-up with Neurology and consider Neuropsychiatry Evaluation -Continue with haloperidol lactate 5 mg IM every 6 hours as needed for Agitation and with lorazepam 1 mg IM every 6 hours as needed for extreme agitation not resolved by the Haldol -Continue with hydroxyzine 25 mg p.o. twice daily as needed for anxiety -Suspect underlying dementia with memory issues.   Essential hypertension -Amlodipine 5 mg po Daily discontinued due to soft BP -BP overall stable  History of Alcohol Abuse -Treated with CIWA protocol.  -Continue folate, thiamine and multivitamin.  -Continue with hydroxyzine as above  History of Tobacco Abuse -Continue nicotine patch 14 mg Transdermal patch every 24 hours  Generalized Weakness and Deconditioning -PT/OT recommending SNF. Social worker following. -Patient does need supervision as she appears to have short-term memory impairment.   Xerosis/Eczema -Aveeno soothing bath treatments -Continue with mineral oil-hydrophilic petrolatum topically twice daily as needed for dry skin as well as Hydroscerin cream 2 Applications Topically as Needed fro Dry Skin/Relief/Moisturize and c/w Ketoconazole 2% Cream  DVT prophylaxis: Lovenox 40 mg sq q24h Code Status: FULL CODE Family Communication: No family available Disposition Plan: Patient has been  admitted from home with acute metabolic  encephalopathy and sepsis secondary to urinary tract infection.  Her mental status waxes and wanes therefore she is an unsafe to be discharged home alone.  She continues to require SNF placement however it is been difficult to place.  Currently ALF had been reviewing the patient and required a TB skin testing as well as a Covid test which is Negative. ALF was interested in the patient but the patient was not accepted to their ALF due to question of payment if the pateint's Medicaid is denied.  She will remain in the hospital until a safe discharge disposition is obtained.  TOC on board, continue to work on skilled nursing facility placement  Consultants:   Neurology  Psychiatry    Procedures: None   Antimicrobials:  Anti-infectives (From admission, onward)   Start     Dose/Rate Route Frequency Ordered Stop   12/14/18 0100  cefTRIAXone (ROCEPHIN) 1 g in sodium chloride 0.9 % 100 mL IVPB  Status:  Discontinued     1 g 200 mL/hr over 30 Minutes Intravenous Every 24 hours 12/14/18 0010 12/17/18 0948      Objective: Vitals:   05/03/19 1720 05/03/19 2211 05/04/19 2115 05/05/19 0813  BP: 106/80 120/80 111/80 (!) 89/62  Pulse: 80 75 84 69  Resp: 16  16 19   Temp: 97.9 F (36.6 C) 97.7 F (36.5 C) 98.2 F (36.8 C) 98 F (36.7 C)  TempSrc:  Oral  Oral  SpO2: 96% 100% 100% 97%  Weight:      Height:       No intake or output data in the 24 hours ending 05/05/19 1415 Filed Weights   12/15/18 0101 02/22/19 0359  Weight: 98.4 kg 67.1 kg   Examination: Physical Exam:  General: NAD   Cardiovascular: S1, S2 present  Respiratory: CTAB  Abdomen: Soft, nontender, nondistended, bowel sounds present  Musculoskeletal: No bilateral pedal edema noted  Skin: Normal  Psychiatry: Normal mood    Vitals:   05/03/19 1720 05/03/19 2211 05/04/19 2115 05/05/19 0813  BP: 106/80 120/80 111/80 (!) 89/62  Pulse: 80 75 84 69  Resp: 16  16 19   Temp: 97.9 F (36.6 C) 97.7 F (36.5 C) 98.2 F  (36.8 C) 98 F (36.7 C)  TempSrc:  Oral  Oral  SpO2: 96% 100% 100% 97%  Weight:      Height:       Data Reviewed: I have personally reviewed following labs and imaging studies  CBC: Recent Labs  Lab 05/04/19 0117  WBC 9.0  NEUTROABS 4.7  HGB 13.7  HCT 41.6  MCV 86.3  PLT 977   Basic Metabolic Panel: Recent Labs  Lab 05/04/19 0117  NA 142  K 4.1  CL 105  CO2 25  GLUCOSE 101*  BUN 15  CREATININE 0.81  CALCIUM 9.8   GFR: Estimated Creatinine Clearance: 70.9 mL/min (by C-G formula based on SCr of 0.81 mg/dL). Liver Function Tests: No results for input(s): AST, ALT, ALKPHOS, BILITOT, PROT, ALBUMIN in the last 168 hours.    Estimated body mass index is 23.18 kg/m as calculated from the following:   Height as of this encounter: 5' 7"  (1.702 m).   Weight as of this encounter: 67.1 kg.  Malnutrition Type:  Nutrition Problem: Increased nutrient needs Etiology: acute illness   Malnutrition Characteristics:  Signs/Symptoms: estimated needs   Nutrition Interventions:  Interventions: Ensure Enlive (each supplement provides 350kcal and 20 grams of protein), MVI  Radiology Studies: No  results found.  Scheduled Meds: . alum & mag hydroxide-simeth  30 mL Oral Once   And  . lidocaine  15 mL Oral Once  . Aveeno [employer] Treatment  1 packet Topical Daily  . diphenhydrAMINE  25 mg Intravenous Once  . enoxaparin (LOVENOX) injection  40 mg Subcutaneous Q24H  . feeding supplement (ENSURE ENLIVE)  237 mL Oral BID BM  . folic acid  1 mg Oral Daily  . ketoconazole   Topical BID  . loratadine  10 mg Oral Daily  . multivitamin with minerals  1 tablet Oral Daily  . nicotine  14 mg Transdermal Daily  . polyethylene glycol  17 g Oral BID  . senna-docusate  1 tablet Oral BID  . thiamine  100 mg Oral Daily  . triamcinolone 0.1 % cream : eucerin   Topical BID   Continuous Infusions:   LOS: 143 days   Alma Friendly, MD [facility] PAGER is on  [place]  If 7PM-7AM, please contact night-coverage [email or web address]

## 2019-05-06 NOTE — Progress Notes (Signed)
PROGRESS NOTE    ONESTY CLAIR  MPN:[id number] DOB: 05-03-1955 DOA: 12/13/2018 PCP: Marcine Matar, MD   Brief Narrative:  The patient is a 64 year old African-American female with a past medical history significant for but not limited to asthma, hypertension, alcohol abuse who originally presented on 12/13/2018 for confusion and altered mental status.  She was found to have a UTI with urine cultures showing multiple species.  MRI of the brain was done and was suggestive of possible Wernicke's encephalopathy. She was treated with thiamine.  She subsequently improved and PT OT recommended SNF placement.  She is unsafe to discharge home given her mental status fluctuating with underlying dementia and suspected difficulty with memory issues.  She will need to be discharged to skilled nursing facility for safe disposition however currently bed is unavailable per social worker and they are in the process of attempting to place the patient but disposition is very difficult. ALF reviewed her case however she was unable to be accepted due to payment issue. Patient does not have a place to go as her apartment had to be let go due to able to hold it any more so she will need to remain in the hospital until a safe discharge disposition is obtained.   Subjective: Patient seen and examined at bedside.  Denies any new complaints.  Assessment & Plan:   Principal Problem:   Wernicke encephalopathy Active Problems:   History of alcohol use   TOBACCO ABUSE   Essential hypertension   AMS (altered mental status)   Confusion   Acute metabolic encephalopathy   Agitation   Acute lower UTI   Sepsis (HCC)   Sepsis secondary to UTI (HCC)   Normocytic anemia  Severe sepsis 2/2 UTI: Present on Admission, Resolved -Urine culture showed multiple species. Repeat culture showed less than 10,000 colonies.  -Was initially treated with IV Rocephin and fluids, subsequently antibiotics have been  discontinued  Acute Metabolic Encephalopathy on likely baseline dementia/wernicke's encephalopathy, stable CT of the head showed mild atrophic changes without acute intracranial abnormality -MRI of the brain: Symmetric diffusion weighted and T2/FLAIR hyperintense signal abnormality within the medial thalami. Findings favor sequelae of Wernicke's encephalopathy -There was question of patient having alcohol abuse however alcohol level less than 10 -Urine drug screen negative -Completed IV thiamine. Currently on oral thiamine now.  -Continue folic acid, multivitamin. -Outpatient follow-up with Neurology and consider Neuropsychiatry Evaluation -Continue with haloperidol lactate 5 mg IM every 6 hours as needed for Agitation and with lorazepam 1 mg IM every 6 hours as needed for extreme agitation not resolved by the Haldol -Continue with hydroxyzine 25 mg p.o. twice daily as needed for anxiety -Suspect underlying dementia with memory issues.   Essential hypertension -Amlodipine 5 mg po Daily discontinued due to soft BP -BP overall stable  History of Alcohol Abuse -Treated with CIWA protocol.  -Continue folate, thiamine and multivitamin.  -Continue with hydroxyzine as above  History of Tobacco Abuse -Continue nicotine patch 14 mg Transdermal patch every 24 hours  Generalized Weakness and Deconditioning -PT/OT recommending SNF. Social worker following. -Patient does need supervision as she appears to have short-term memory impairment.   Xerosis/Eczema -Aveeno soothing bath treatments -Continue with mineral oil-hydrophilic petrolatum topically twice daily as needed for dry skin as well as Hydroscerin cream 2 Applications Topically as Needed fro Dry Skin/Relief/Moisturize and c/w Ketoconazole 2% Cream  DVT prophylaxis: Lovenox 40 mg sq q24h Code Status: FULL CODE Family Communication: No family available Disposition Plan: Patient has been  admitted from home with acute metabolic  encephalopathy and sepsis secondary to urinary tract infection.  Her mental status waxes and wanes therefore she is an unsafe to be discharged home alone.  She continues to require SNF placement however it is been difficult to place.  Currently ALF had been reviewing the patient and required a TB skin testing as well as a Covid test which is Negative. ALF was interested in the patient but the patient was not accepted to their ALF due to question of payment if the pateint's Medicaid is denied.  She will remain in the hospital until a safe discharge disposition is obtained.  TOC on board, continue to work on skilled nursing facility placement  Consultants:   Neurology  Psychiatry    Procedures: None   Antimicrobials:  Anti-infectives (From admission, onward)   Start     Dose/Rate Route Frequency Ordered Stop   12/14/18 0100  cefTRIAXone (ROCEPHIN) 1 g in sodium chloride 0.9 % 100 mL IVPB  Status:  Discontinued     1 g 200 mL/hr over 30 Minutes Intravenous Every 24 hours 12/14/18 0010 12/17/18 0948      Objective: Vitals:   05/04/19 2115 05/05/19 0813 05/05/19 2020 05/06/19 0802  BP: 111/80 (!) 89/62 116/84 104/76  Pulse: 84 69 80 76  Resp: 16 19 19 17   Temp: 98.2 F (36.8 C) 98 F (36.7 C) 98 F (36.7 C) 98.1 F (36.7 C)  TempSrc:  Oral Oral   SpO2: 100% 97% 98% 99%  Weight:      Height:       No intake or output data in the 24 hours ending 05/06/19 1549 Filed Weights   12/15/18 0101 02/22/19 0359  Weight: 98.4 kg 67.1 kg   Examination: Physical Exam:  General: NAD   Cardiovascular: S1, S2 present  Respiratory: CTAB  Abdomen: Soft, nontender, nondistended, bowel sounds present  Musculoskeletal: No bilateral pedal edema noted  Skin: Normal  Psychiatry: Normal mood    Vitals:   05/04/19 2115 05/05/19 0813 05/05/19 2020 05/06/19 0802  BP: 111/80 (!) 89/62 116/84 104/76  Pulse: 84 69 80 76  Resp: 16 19 19 17   Temp: 98.2 F (36.8 C) 98 F (36.7 C) 98 F  (36.7 C) 98.1 F (36.7 C)  TempSrc:  Oral Oral   SpO2: 100% 97% 98% 99%  Weight:      Height:       Data Reviewed: I have personally reviewed following labs and imaging studies  CBC: Recent Labs  Lab 05/04/19 0117  WBC 9.0  NEUTROABS 4.7  HGB 13.7  HCT 41.6  MCV 86.3  PLT 008   Basic Metabolic Panel: Recent Labs  Lab 05/04/19 0117  NA 142  K 4.1  CL 105  CO2 25  GLUCOSE 101*  BUN 15  CREATININE 0.81  CALCIUM 9.8   GFR: Estimated Creatinine Clearance: 70.9 mL/min (by C-G formula based on SCr of 0.81 mg/dL). Liver Function Tests: No results for input(s): AST, ALT, ALKPHOS, BILITOT, PROT, ALBUMIN in the last 168 hours.    Estimated body mass index is 23.18 kg/m as calculated from the following:   Height as of this encounter: 5\' 7"  (1.702 m).   Weight as of this encounter: 67.1 kg.  Malnutrition Type:  Nutrition Problem: Increased nutrient needs Etiology: acute illness   Malnutrition Characteristics:  Signs/Symptoms: estimated needs   Nutrition Interventions:  Interventions: Ensure Enlive (each supplement provides 350kcal and 20 grams of protein), MVI  Radiology Studies: No  results found.  Scheduled Meds: . alum & mag hydroxide-simeth  30 mL Oral Once   And  . lidocaine  15 mL Oral Once  . Aveeno [employer] Treatment  1 packet Topical Daily  . diphenhydrAMINE  25 mg Intravenous Once  . enoxaparin (LOVENOX) injection  40 mg Subcutaneous Q24H  . feeding supplement (ENSURE ENLIVE)  237 mL Oral BID BM  . folic acid  1 mg Oral Daily  . ketoconazole   Topical BID  . loratadine  10 mg Oral Daily  . multivitamin with minerals  1 tablet Oral Daily  . nicotine  14 mg Transdermal Daily  . polyethylene glycol  17 g Oral BID  . senna-docusate  1 tablet Oral BID  . thiamine  100 mg Oral Daily  . triamcinolone 0.1 % cream : eucerin   Topical BID   Continuous Infusions:   LOS: 144 days   Briant Cedar, MD [facility] PAGER is on  AMION  If 7PM-7AM, please contact night-coverage [email or web address]

## 2019-05-07 NOTE — Progress Notes (Signed)
PROGRESS NOTE    Marie Woods  MPN:[id number] DOB: 05-03-1955 DOA: 12/13/2018 PCP: Marcine Matar, MD   Brief Narrative:  The patient is a 64 year old African-American female with a past medical history significant for but not limited to asthma, hypertension, alcohol abuse who originally presented on 12/13/2018 for confusion and altered mental status.  She was found to have a UTI with urine cultures showing multiple species.  MRI of the brain was done and was suggestive of possible Wernicke's encephalopathy. She was treated with thiamine.  She subsequently improved and PT OT recommended SNF placement.  She is unsafe to discharge home given her mental status fluctuating with underlying dementia and suspected difficulty with memory issues.  She will need to be discharged to skilled nursing facility for safe disposition however currently bed is unavailable per social worker and they are in the process of attempting to place the patient but disposition is very difficult. ALF reviewed her case however she was unable to be accepted due to payment issue. Patient does not have a place to go as her apartment had to be let go due to able to hold it any more so she will need to remain in the hospital until a safe discharge disposition is obtained.   Subjective: Patient seen and examined at bedside.  Denies any new complaints.  Assessment & Plan:   Principal Problem:   Wernicke encephalopathy Active Problems:   History of alcohol use   TOBACCO ABUSE   Essential hypertension   AMS (altered mental status)   Confusion   Acute metabolic encephalopathy   Agitation   Acute lower UTI   Sepsis (HCC)   Sepsis secondary to UTI (HCC)   Normocytic anemia  Severe sepsis 2/2 UTI: Present on Admission, Resolved -Urine culture showed multiple species. Repeat culture showed less than 10,000 colonies.  -Was initially treated with IV Rocephin and fluids, subsequently antibiotics have been  discontinued  Acute Metabolic Encephalopathy on likely baseline dementia/wernicke's encephalopathy, stable CT of the head showed mild atrophic changes without acute intracranial abnormality -MRI of the brain: Symmetric diffusion weighted and T2/FLAIR hyperintense signal abnormality within the medial thalami. Findings favor sequelae of Wernicke's encephalopathy -There was question of patient having alcohol abuse however alcohol level less than 10 -Urine drug screen negative -Completed IV thiamine. Currently on oral thiamine now.  -Continue folic acid, multivitamin. -Outpatient follow-up with Neurology and consider Neuropsychiatry Evaluation -Continue with haloperidol lactate 5 mg IM every 6 hours as needed for Agitation and with lorazepam 1 mg IM every 6 hours as needed for extreme agitation not resolved by the Haldol -Continue with hydroxyzine 25 mg p.o. twice daily as needed for anxiety -Suspect underlying dementia with memory issues.   Essential hypertension -Amlodipine 5 mg po Daily discontinued due to soft BP -BP overall stable  History of Alcohol Abuse -Treated with CIWA protocol.  -Continue folate, thiamine and multivitamin.  -Continue with hydroxyzine as above  History of Tobacco Abuse -Continue nicotine patch 14 mg Transdermal patch every 24 hours  Generalized Weakness and Deconditioning -PT/OT recommending SNF. Social worker following. -Patient does need supervision as she appears to have short-term memory impairment.   Xerosis/Eczema -Aveeno soothing bath treatments -Continue with mineral oil-hydrophilic petrolatum topically twice daily as needed for dry skin as well as Hydroscerin cream 2 Applications Topically as Needed fro Dry Skin/Relief/Moisturize and c/w Ketoconazole 2% Cream  DVT prophylaxis: Lovenox 40 mg sq q24h Code Status: FULL CODE Family Communication: No family available Disposition Plan: Patient has been  admitted from home with acute metabolic  encephalopathy and sepsis secondary to urinary tract infection.  Her mental status waxes and wanes therefore she is an unsafe to be discharged home alone.  She continues to require SNF placement however it is been difficult to place.  Currently ALF had been reviewing the patient and required a TB skin testing as well as a Covid test which is Negative. ALF was interested in the patient but the patient was not accepted to their ALF due to question of payment if the pateint's Medicaid is denied.  She will remain in the hospital until a safe discharge disposition is obtained.  TOC on board, continue to work on skilled nursing facility placement  Consultants:   Neurology  Psychiatry    Procedures: None   Antimicrobials:  Anti-infectives (From admission, onward)   Start     Dose/Rate Route Frequency Ordered Stop   12/14/18 0100  cefTRIAXone (ROCEPHIN) 1 g in sodium chloride 0.9 % 100 mL IVPB  Status:  Discontinued     1 g 200 mL/hr over 30 Minutes Intravenous Every 24 hours 12/14/18 0010 12/17/18 0948      Objective: Vitals:   05/06/19 0802 05/06/19 2129 05/06/19 2235 05/07/19 0801  BP: 104/76  107/72 90/61  Pulse: 76  78 64  Resp: 17  18 16   Temp: 98.1 F (36.7 C)  98.2 F (36.8 C) 98.4 F (36.9 C)  TempSrc:   Oral   SpO2: 99% 98% 98% 98%  Weight:      Height:       No intake or output data in the 24 hours ending 05/07/19 1445 Filed Weights   12/15/18 0101 02/22/19 0359  Weight: 98.4 kg 67.1 kg   Examination: Physical Exam:  General: NAD   Cardiovascular: S1, S2 present  Respiratory: CTAB  Abdomen: Soft, nontender, nondistended, bowel sounds present  Musculoskeletal: No bilateral pedal edema noted  Skin: Normal  Psychiatry: Normal mood    Vitals:   05/06/19 0802 05/06/19 2129 05/06/19 2235 05/07/19 0801  BP: 104/76  107/72 90/61  Pulse: 76  78 64  Resp: 17  18 16   Temp: 98.1 F (36.7 C)  98.2 F (36.8 C) 98.4 F (36.9 C)  TempSrc:   Oral   SpO2: 99%  98% 98% 98%  Weight:      Height:       Data Reviewed: I have personally reviewed following labs and imaging studies  CBC: Recent Labs  Lab 05/04/19 0117  WBC 9.0  NEUTROABS 4.7  HGB 13.7  HCT 41.6  MCV 86.3  PLT 505   Basic Metabolic Panel: Recent Labs  Lab 05/04/19 0117  NA 142  K 4.1  CL 105  CO2 25  GLUCOSE 101*  BUN 15  CREATININE 0.81  CALCIUM 9.8   GFR: Estimated Creatinine Clearance: 70.9 mL/min (by C-G formula based on SCr of 0.81 mg/dL). Liver Function Tests: No results for input(s): AST, ALT, ALKPHOS, BILITOT, PROT, ALBUMIN in the last 168 hours.    Estimated body mass index is 23.18 kg/m as calculated from the following:   Height as of this encounter: 5\' 7"  (1.702 m).   Weight as of this encounter: 67.1 kg.  Malnutrition Type:  Nutrition Problem: Increased nutrient needs Etiology: acute illness   Malnutrition Characteristics:  Signs/Symptoms: estimated needs   Nutrition Interventions:  Interventions: Ensure Enlive (each supplement provides 350kcal and 20 grams of protein), MVI  Radiology Studies: No results found.  Scheduled Meds: . alum &  mag hydroxide-simeth  30 mL Oral Once   And  . lidocaine  15 mL Oral Once  . Aveeno [employer] Treatment  1 packet Topical Daily  . diphenhydrAMINE  25 mg Intravenous Once  . enoxaparin (LOVENOX) injection  40 mg Subcutaneous Q24H  . feeding supplement (ENSURE ENLIVE)  237 mL Oral BID BM  . folic acid  1 mg Oral Daily  . ketoconazole   Topical BID  . loratadine  10 mg Oral Daily  . multivitamin with minerals  1 tablet Oral Daily  . nicotine  14 mg Transdermal Daily  . polyethylene glycol  17 g Oral BID  . senna-docusate  1 tablet Oral BID  . thiamine  100 mg Oral Daily  . triamcinolone 0.1 % cream : eucerin   Topical BID   Continuous Infusions:   LOS: 145 days   Briant Cedar, MD [facility] PAGER is on AMION  If 7PM-7AM, please contact  night-coverage [email or web address]

## 2019-05-08 NOTE — Progress Notes (Signed)
Pt became frustrated & angry: stated that she was leaving and that her sister was waiting downstairs for her. Pt got dressed and continued to endorse that she was leaving & that her sister was waiting. This RN asked for the name of the sister, so that the sister could be called for verification and that security could be notified. Pt refused. Pt's contact Bjorn Loser, niece, was called for verification. Bjorn Loser stated that the pt has one sister named Darel Hong that is deceased. Security was notified of pt attempting to leave, but they did not need to speak with the pt. This RN and NS were able to discuss hospitalization with the pt, and pt went into her room to call family. Family endorsed that pt must stay in the hospital for now. Pt calm and happy after phone call, and asked for an ice cream sandwich.

## 2019-05-08 NOTE — Progress Notes (Signed)
PROGRESS NOTE    Marie Woods  XVQ:[id number] DOB: August 24, 1955 DOA: 12/13/2018 PCP: Marcine Matar, MD   Brief Narrative:  The patient is a 64 year old African-American female with a past medical history significant for but not limited to asthma, hypertension, alcohol abuse who originally presented on 12/13/2018 for confusion and altered mental status.  She was found to have a UTI with urine cultures showing multiple species.  MRI of the brain was done and was suggestive of possible Wernicke's encephalopathy. She was treated with thiamine.  She subsequently improved and PT OT recommended SNF placement.  She is unsafe to discharge home given her mental status fluctuating with underlying dementia and suspected difficulty with memory issues.  She will need to be discharged to skilled nursing facility for safe disposition however currently bed is unavailable per social worker and they are in the process of attempting to place the patient but disposition is very difficult. ALF reviewed her case however she was unable to be accepted due to payment issue. Patient does not have a place to go as her apartment had to be let go due to able to hold it any more so she will need to remain in the hospital until a safe discharge disposition is obtained.   Subjective: Patient seen and examined at bedside.  Denies any new complaints.  Assessment & Plan:   Principal Problem:   Wernicke encephalopathy Active Problems:   History of alcohol use   TOBACCO ABUSE   Essential hypertension   AMS (altered mental status)   Confusion   Acute metabolic encephalopathy   Agitation   Acute lower UTI   Sepsis (HCC)   Sepsis secondary to UTI (HCC)   Normocytic anemia   Severe sepsis 2/2 UTI: Present on Admission, Resolved -Urine culture showed multiple species. Repeat culture showed less than 10,000 colonies.  -Was initially treated with IV Rocephin and fluids, subsequently antibiotics have been  discontinued  Acute Metabolic Encephalopathy on likely baseline dementia/wernicke's encephalopathy, stable CT of the head showed mild atrophic changes without acute intracranial abnormality -MRI of the brain: Symmetric diffusion weighted and T2/FLAIR hyperintense signal abnormality within the medial thalami. Findings favor sequelae of Wernicke's encephalopathy -There was question of patient having alcohol abuse however alcohol level less than 10 -Urine drug screen negative -Completed IV thiamine. Currently on oral thiamine now.  -Continue folic acid, multivitamin. -Outpatient follow-up with Neurology and consider Neuropsychiatry Evaluation -Continue with haloperidol lactate 5 mg IM every 6 hours as needed for Agitation and with lorazepam 1 mg IM every 6 hours as needed for extreme agitation not resolved by the Haldol -Continue with hydroxyzine 25 mg p.o. twice daily as needed for anxiety -Suspect underlying dementia with memory issues.   Essential hypertension -Amlodipine 5 mg po Daily discontinued due to soft BP -BP overall stable  History of Alcohol Abuse -Treated with CIWA protocol.  -Continue folate, thiamine and multivitamin.  -Continue with hydroxyzine as above  History of Tobacco Abuse -Continue nicotine patch 14 mg Transdermal patch every 24 hours  Generalized Weakness and Deconditioning -PT/OT recommending SNF. Social worker following. -Patient does need supervision as she appears to have short-term memory impairment.   Xerosis/Eczema -Aveeno soothing bath treatments -Continue with mineral oil-hydrophilic petrolatum topically twice daily as needed for dry skin as well as Hydroscerin cream 2 Applications Topically as Needed fro Dry Skin/Relief/Moisturize and c/w Ketoconazole 2% Cream  DVT prophylaxis: Lovenox 40 mg sq q24h Code Status: FULL CODE Family Communication: No family available Disposition Plan: Patient has  been admitted from home with acute metabolic  encephalopathy and sepsis secondary to urinary tract infection.  Her mental status waxes and wanes therefore she is an unsafe to be discharged home alone.  She continues to require SNF placement however it is been difficult to place.  Currently ALF had been reviewing the patient and required a TB skin testing as well as a Covid test which is Negative. ALF was interested in the patient but the patient was not accepted to their ALF due to question of payment if the pateint's Medicaid is denied.  She will remain in the hospital until a safe discharge disposition is obtained.  TOC on board, continue to work on skilled nursing facility placement  Consultants:   Neurology  Psychiatry    Procedures: None   Antimicrobials:  Anti-infectives (From admission, onward)   Start     Dose/Rate Route Frequency Ordered Stop   12/14/18 0100  cefTRIAXone (ROCEPHIN) 1 g in sodium chloride 0.9 % 100 mL IVPB  Status:  Discontinued     1 g 200 mL/hr over 30 Minutes Intravenous Every 24 hours 12/14/18 0010 12/17/18 0948      Objective: Vitals:   05/07/19 1828 05/07/19 2154 05/08/19 0852 05/08/19 1703  BP: 111/72 113/79 126/87 (!) 115/95  Pulse: 85 83 66 75  Resp: 16 14 19 16   Temp: 98.6 F (37 C) 97.6 F (36.4 C) 97.9 F (36.6 C) 98.3 F (36.8 C)  TempSrc:   Oral Oral  SpO2: 100% 100% 100% 100%  Weight:      Height:        Intake/Output Summary (Last 24 hours) at 05/08/2019 1758 Last data filed at 05/08/2019 1225 Gross per 24 hour  Intake 240 ml  Output 1 ml  Net 239 ml   Filed Weights   12/15/18 0101 02/22/19 0359  Weight: 98.4 kg 67.1 kg   Examination: Physical Exam:  General: NAD   Cardiovascular: S1, S2 present  Respiratory: CTAB  Abdomen: Soft, nontender, nondistended, bowel sounds present  Musculoskeletal: No bilateral pedal edema noted  Skin: Normal  Psychiatry: Normal mood    Vitals:   05/07/19 1828 05/07/19 2154 05/08/19 0852 05/08/19 1703  BP: 111/72 113/79 126/87  (!) 115/95  Pulse: 85 83 66 75  Resp: 16 14 19 16   Temp: 98.6 F (37 C) 97.6 F (36.4 C) 97.9 F (36.6 C) 98.3 F (36.8 C)  TempSrc:   Oral Oral  SpO2: 100% 100% 100% 100%  Weight:      Height:       Data Reviewed: I have personally reviewed following labs and imaging studies  CBC: Recent Labs  Lab 05/04/19 0117  WBC 9.0  NEUTROABS 4.7  HGB 13.7  HCT 41.6  MCV 86.3  PLT 245   Basic Metabolic Panel: Recent Labs  Lab 05/04/19 0117  NA 142  K 4.1  CL 105  CO2 25  GLUCOSE 101*  BUN 15  CREATININE 0.81  CALCIUM 9.8   GFR: Estimated Creatinine Clearance: 70.9 mL/min (by C-G formula based on SCr of 0.81 mg/dL). Liver Function Tests: No results for input(s): AST, ALT, ALKPHOS, BILITOT, PROT, ALBUMIN in the last 168 hours.    Estimated body mass index is 23.18 kg/m as calculated from the following:   Height as of this encounter: 5\' 7"  (1.702 m).   Weight as of this encounter: 67.1 kg.  Malnutrition Type:  Nutrition Problem: Increased nutrient needs Etiology: acute illness   Malnutrition Characteristics:  Signs/Symptoms: estimated needs  Nutrition Interventions:  Interventions: Ensure Enlive (each supplement provides 350kcal and 20 grams of protein), MVI  Radiology Studies: No results found.  Scheduled Meds: . alum & mag hydroxide-simeth  30 mL Oral Once   And  . lidocaine  15 mL Oral Once  . Aveeno [employer] Treatment  1 packet Topical Daily  . diphenhydrAMINE  25 mg Intravenous Once  . enoxaparin (LOVENOX) injection  40 mg Subcutaneous Q24H  . feeding supplement (ENSURE ENLIVE)  237 mL Oral BID BM  . folic acid  1 mg Oral Daily  . ketoconazole   Topical BID  . loratadine  10 mg Oral Daily  . multivitamin with minerals  1 tablet Oral Daily  . nicotine  14 mg Transdermal Daily  . polyethylene glycol  17 g Oral BID  . senna-docusate  1 tablet Oral BID  . thiamine  100 mg Oral Daily  . triamcinolone 0.1 % cream : eucerin   Topical BID    Continuous Infusions:   LOS: 146 days   Briant Cedar, MD [facility] PAGER is on AMION  If 7PM-7AM, please contact night-coverage [email or web address]

## 2019-05-08 NOTE — TOC Progression Note (Signed)
Transition of Care [facility]) - Progression Note    Patient Details  Name: Marie Woods MRN: [id number] Date of Birth: Aug 24, 1955  Transition of Care [facility]) CM/SW Contact  Mearl Latin, LCSW Phone Number: 05/08/2019, 4:13 PM  Clinical Narrative:    Still no bed offers. CSW and [facility] leadership continuing to follow.    Expected Discharge Plan: Skilled Nursing Facility Barriers to Discharge: No SNF bed, SNF Pending payor source - LOG  Expected Discharge Plan and Services Expected Discharge Plan: Skilled Nursing Facility In-house Referral: Clinical Social Work Discharge Planning Services: NA Post Acute Care Choice: Skilled Nursing Facility Living arrangements for the past 2 months: Apartment                 DME Arranged: N/A DME Agency: NA                   Social Determinants of Health (SDOH) Interventions    Readmission Risk Interventions No flowsheet data found.

## 2019-05-09 NOTE — TOC Progression Note (Addendum)
Transition of Care [facility]) - Progression Note    Patient Details  Name: Marie Woods MRN: [id number] Date of Birth: Jun 13, 1955  Transition of Care [facility]) CM/SW Contact  Mearl Latin, LCSW Phone Number: 05/09/2019, 12:36 PM  Clinical Narrative:    -CSW faxed referral to [employer]. Blondie reports she will review it.  -Per [facility], they are unable to accept patients with Dementia.  -Left voicemail for [facility].    Expected Discharge Plan: Skilled Nursing Facility Barriers to Discharge: No SNF bed, SNF Pending payor source - LOG  Expected Discharge Plan and Services Expected Discharge Plan: Skilled Nursing Facility In-house Referral: Clinical Social Work Discharge Planning Services: NA Post Acute Care Choice: Skilled Nursing Facility Living arrangements for the past 2 months: Apartment                 DME Arranged: N/A DME Agency: NA                   Social Determinants of Health (SDOH) Interventions    Readmission Risk Interventions No flowsheet data found.

## 2019-05-09 NOTE — Progress Notes (Signed)
PROGRESS NOTE    Marie Woods  WVP:[id number] DOB: 1956-01-29 DOA: 12/13/2018 PCP: Marcine Matar, MD   Brief Narrative:  The patient is a 64 year old African-American female with a past medical history significant for but not limited to asthma, hypertension, alcohol abuse who originally presented on 12/13/2018 for confusion and altered mental status.  She was found to have a UTI with urine cultures showing multiple species.  MRI of the brain was done and was suggestive of possible Wernicke's encephalopathy. She was treated with thiamine.  She subsequently improved and PT OT recommended SNF placement.  She is unsafe to discharge home given her mental status fluctuating with underlying dementia and suspected difficulty with memory issues.  She will need to be discharged to skilled nursing facility for safe disposition however currently bed is unavailable per social worker and they are in the process of attempting to place the patient but disposition is very difficult. ALF reviewed her case however she was unable to be accepted due to payment issue. Patient does not have a place to go as her apartment had to be let go due to able to hold it any more so she will need to remain in the hospital until a safe discharge disposition is obtained.   Subjective: Patient seen and examined at bedside.  Overnight, patient attempted to leave the hospital stating she wants to go home.  Denies any new complaints today  Assessment & Plan:   Principal Problem:   Wernicke encephalopathy Active Problems:   History of alcohol use   TOBACCO ABUSE   Essential hypertension   AMS (altered mental status)   Confusion   Acute metabolic encephalopathy   Agitation   Acute lower UTI   Sepsis (HCC)   Sepsis secondary to UTI (HCC)   Normocytic anemia   Severe sepsis 2/2 UTI: Present on Admission, Resolved -Urine culture showed multiple species. Repeat culture showed less than 10,000 colonies.  -Was initially  treated with IV Rocephin and fluids, subsequently antibiotics have been discontinued  Acute Metabolic Encephalopathy on likely baseline dementia/wernicke's encephalopathy, stable CT of the head showed mild atrophic changes without acute intracranial abnormality -MRI of the brain: Symmetric diffusion weighted and T2/FLAIR hyperintense signal abnormality within the medial thalami. Findings favor sequelae of Wernicke's encephalopathy -There was question of patient having alcohol abuse however alcohol level less than 10 -Urine drug screen negative -Completed IV thiamine. Currently on oral thiamine now.  -Continue folic acid, multivitamin. -Outpatient follow-up with Neurology and consider Neuropsychiatry Evaluation -Continue with haloperidol lactate 5 mg IM every 6 hours as needed for Agitation and with lorazepam 1 mg IM every 6 hours as needed for extreme agitation not resolved by the Haldol -Continue with hydroxyzine 25 mg p.o. twice daily as needed for anxiety -Suspect underlying dementia with memory issues.   Essential hypertension -Amlodipine 5 mg po Daily discontinued due to soft BP -BP overall stable  History of Alcohol Abuse -Treated with CIWA protocol.  -Continue folate, thiamine and multivitamin.  -Continue with hydroxyzine as above  History of Tobacco Abuse -Continue nicotine patch 14 mg Transdermal patch every 24 hours  Generalized Weakness and Deconditioning -PT/OT recommending SNF. Social worker following. -Patient does need supervision as she appears to have short-term memory impairment.   Xerosis/Eczema -Aveeno soothing bath treatments -Continue with mineral oil-hydrophilic petrolatum topically twice daily as needed for dry skin as well as Hydroscerin cream 2 Applications Topically as Needed fro Dry Skin/Relief/Moisturize and c/w Ketoconazole 2% Cream  DVT prophylaxis: Lovenox 40 mg  sq q24h Code Status: FULL CODE Family Communication: No family available  Disposition Plan: Patient has been admitted from home with acute metabolic encephalopathy and sepsis secondary to urinary tract infection.  Her mental status waxes and wanes therefore she is an unsafe to be discharged home alone.  She continues to require SNF placement however it is been difficult to place.  Currently ALF had been reviewing the patient and required a TB skin testing as well as a Covid test which is Negative. ALF was interested in the patient but the patient was not accepted to their ALF due to question of payment if the pateint's Medicaid is denied.  She will remain in the hospital until a safe discharge disposition is obtained.  TOC on board, continue to work on skilled nursing facility placement  Consultants:   Neurology  Psychiatry    Procedures: None   Antimicrobials:  Anti-infectives (From admission, onward)   Start     Dose/Rate Route Frequency Ordered Stop   12/14/18 0100  cefTRIAXone (ROCEPHIN) 1 g in sodium chloride 0.9 % 100 mL IVPB  Status:  Discontinued     1 g 200 mL/hr over 30 Minutes Intravenous Every 24 hours 12/14/18 0010 12/17/18 0948      Objective: Vitals:   05/07/19 2154 05/08/19 0852 05/08/19 1703 05/09/19 0840  BP: 113/79 126/87 (!) 115/95 (!) 90/58  Pulse: 83 66 75 75  Resp: 14 19 16 16   Temp: 97.6 F (36.4 C) 97.9 F (36.6 C) 98.3 F (36.8 C) 98.7 F (37.1 C)  TempSrc:  Oral Oral   SpO2: 100% 100% 100% 99%  Weight:      Height:        Intake/Output Summary (Last 24 hours) at 05/09/2019 1741 Last data filed at 05/09/2019 0730 Gross per 24 hour  Intake 237 ml  Output -  Net 237 ml   Filed Weights   12/15/18 0101 02/22/19 0359  Weight: 98.4 kg 67.1 kg   Examination: Physical Exam:  General: NAD   Cardiovascular: S1, S2 present  Respiratory: CTAB  Abdomen: Soft, nontender, nondistended, bowel sounds present  Musculoskeletal: No bilateral pedal edema noted  Skin: Normal  Psychiatry: Normal mood    Vitals:    05/07/19 2154 05/08/19 0852 05/08/19 1703 05/09/19 0840  BP: 113/79 126/87 (!) 115/95 (!) 90/58  Pulse: 83 66 75 75  Resp: 14 19 16 16   Temp: 97.6 F (36.4 C) 97.9 F (36.6 C) 98.3 F (36.8 C) 98.7 F (37.1 C)  TempSrc:  Oral Oral   SpO2: 100% 100% 100% 99%  Weight:      Height:       Data Reviewed: I have personally reviewed following labs and imaging studies  CBC: Recent Labs  Lab 05/04/19 0117  WBC 9.0  NEUTROABS 4.7  HGB 13.7  HCT 41.6  MCV 86.3  PLT 324   Basic Metabolic Panel: Recent Labs  Lab 05/04/19 0117  NA 142  K 4.1  CL 105  CO2 25  GLUCOSE 101*  BUN 15  CREATININE 0.81  CALCIUM 9.8   GFR: Estimated Creatinine Clearance: 70.9 mL/min (by C-G formula based on SCr of 0.81 mg/dL). Liver Function Tests: No results for input(s): AST, ALT, ALKPHOS, BILITOT, PROT, ALBUMIN in the last 168 hours.    Estimated body mass index is 23.18 kg/m as calculated from the following:   Height as of this encounter: 5\' 7"  (1.702 m).   Weight as of this encounter: 67.1 kg.  Malnutrition Type:  Nutrition Problem: Increased nutrient needs Etiology: acute illness   Malnutrition Characteristics:  Signs/Symptoms: estimated needs   Nutrition Interventions:  Interventions: Ensure Enlive (each supplement provides 350kcal and 20 grams of protein), MVI  Radiology Studies: No results found.  Scheduled Meds: . alum & mag hydroxide-simeth  30 mL Oral Once   And  . lidocaine  15 mL Oral Once  . Aveeno [employer]urant Treatment  1 packet Topical Daily  . diphenhydrAMINE  25 mg Intravenous Once  . enoxaparin (LOVENOX) injection  40 mg Subcutaneous Q24H  . feeding supplement (ENSURE ENLIVE)  237 mL Oral BID BM  . folic acid  1 mg Oral Daily  . ketoconazole   Topical BID  . loratadine  10 mg Oral Daily  . multivitamin with minerals  1 tablet Oral Daily  . nicotine  14 mg Transdermal Daily  . polyethylene glycol  17 g Oral BID  . senna-docusate  1 tablet Oral BID  .  thiamine  100 mg Oral Daily  . triamcinolone 0.1 % cream : eucerin   Topical BID   Continuous Infusions:   LOS: 147 days   Alma Friendly, MD [facility] PAGER is on AMION  If 7PM-7AM, please contact night-coverage [email or web address]

## 2019-05-10 NOTE — TOC Progression Note (Signed)
Transition of Care [facility]) - Progression Note    Patient Details  Name: [name] MRN: [id number] Date of Birth: [birth year]  Transition [facility]) CM/SW Contact  Mearl Latin, LCSW Phone Number: 05/10/2019, 3:28 PM  Clinical Narrative:    Tresa Endo at [facility] family care home stated that he does not have any female beds available but provided CSW with contact info for [place] [facility] the Stay program (group homes and family care homes). She also states she does not have any availability at this time.    Expected Discharge Plan: Skilled Nursing Facility Barriers to Discharge: No SNF bed, SNF Pending payor source - LOG  Expected Discharge Plan and Services Expected Discharge Plan: Skilled Nursing Facility In-house Referral: Clinical Social Work Discharge Planning Services: NA Post Acute Care Choice: Skilled Nursing Facility Living arrangements for the past 2 months: Apartment                 DME Arranged: N/A DME Agency: NA                   Social Determinants of Health (SDOH) Interventions    Readmission Risk Interventions No flowsheet data found.

## 2019-05-10 NOTE — Progress Notes (Addendum)
PROGRESS NOTE    Marie Woods  CXK:[id number] DOB: September 14, 1955 DOA: 12/13/2018 PCP: Marcine Matar, MD    Brief Narrative:  64 year old African-American female with a past medical history significant for but not limited to asthma, hypertension, alcohol abuse who originally presented on 12/13/2018 for confusion and altered mental status. She was found to have a UTI with urine cultures showing multiple species. MRI of the brain was done and was suggestive of possible Wernicke's encephalopathy. She was treated with thiamine. She subsequently improved and PT OT recommended SNF placement. She is unsafe to discharge home given her mental status fluctuating with underlying dementia and suspected difficulty with memory issues. She will need to be discharged to skilled nursing facility for safe disposition however currently bed is unavailable per social worker and they are in the process of attempting to place the patientbut disposition is very difficult. ALF reviewed her case however she was unable to be accepted due to payment issue. Patient does not have a place to go as her apartment had to be let go due to able to hold it any more so she will need to remain in the hospital until a safe discharge disposition is obtained.  Assessment & Plan:   Principal Problem:   Wernicke encephalopathy Active Problems:   History of alcohol use   TOBACCO ABUSE   Essential hypertension   AMS (altered mental status)   Confusion   Acute metabolic encephalopathy   Agitation   Acute lower UTI   Sepsis (HCC)   Sepsis secondary to UTI (HCC)   Normocytic anemia   Severe sepsis 2/2 UTI: Present on Admission, Resolved -Urine culture showed multiple species. Repeat culture showed less than 10,000 colonies.  -Was initially treated with IV Rocephin and fluids, subsequently antibiotics have been discontinued -Remains stable  Acute Metabolic Encephalopathy on likely baseline dementia/wernicke's  encephalopathy, stable CT of the head showed mild atrophic changes without acute intracranial abnormality -MRI of the brain: Symmetric diffusion weighted and T2/FLAIR hyperintense signal abnormality within the medial thalami. Findings favor sequelae of Wernicke's encephalopathy -There was question of patient having alcohol abuse however alcohol level less than 10 -Urine drug screen negative -Completed IV thiamine. Currently on oral thiamine now.  -Continue folic acid, multivitamin. -Outpatient follow-up with Neurology and consider Neuropsychiatry Evaluation -Continue with haloperidol lactate 5 mg IM every 6 hours as needed for Agitation and with lorazepam 1 mg IM every 6 hours as needed for extreme agitation not resolved by the Haldol -Continue with hydroxyzine 25 mg p.o. twice daily as needed for anxiety -Suspect underlying dementia with memory issues.   Essential hypertension -Amlodipine 5 mg po Daily discontinued due to soft BP -BP overall stable  History of Alcohol Abuse -Treated with CIWA protocol, no withdrawals at this time -Continue folate, thiamine and multivitamin.  -Continue with hydroxyzine as above  History of Tobacco Abuse -Continue nicotine patch 14 mg Transdermal patch every 24 hours  Generalized Weakness and Deconditioning -PT/OT recommending SNF. Social worker following. -Patient does need supervision as she appears to have short-term memory impairment.   Xerosis/Eczema -Aveeno soothing bath treatments -Continue with mineral oil-hydrophilic petrolatum topically twice daily as needed for dry skin as well as Hydroscerin cream 2 Applications Topically as Needed fro Dry Skin/Relief/Moisturize and c/w Ketoconazole 2% Cream  DVT prophylaxis: Lovenox subQ Code Status: Full Family Communication: Pt in room, family not at bedside Disposition Plan: From home, plan SNF when bed available per SW  Consultants:   Neurology  Psychiatry  Procedures:  Antimicrobials: Anti-infectives (From admission, onward)   Start     Dose/Rate Route Frequency Ordered Stop   12/14/18 0100  cefTRIAXone (ROCEPHIN) 1 g in sodium chloride 0.9 % 100 mL IVPB  Status:  Discontinued     1 g 200 mL/hr over 30 Minutes Intravenous Every 24 hours 12/14/18 0010 12/17/18 0948       Subjective: Very eager to go to rehab  Objective: Vitals:   05/08/19 0852 05/08/19 1703 05/09/19 0840 05/10/19 0747  BP: 126/87 (!) 115/95 (!) 90/58 91/63  Pulse: 66 75 75 67  Resp: 19 16 16 18   Temp: 97.9 F (36.6 C) 98.3 F (36.8 C) 98.7 F (37.1 C) 98.4 F (36.9 C)  TempSrc: Oral Oral  Oral  SpO2: 100% 100% 99%   Weight:      Height:        Intake/Output Summary (Last 24 hours) at 05/10/2019 1515 Last data filed at 05/10/2019 0730 Gross per 24 hour  Intake 240 ml  Output --  Net 240 ml   Filed Weights   12/15/18 0101 02/22/19 0359  Weight: 98.4 kg 67.1 kg    Examination:  General exam: Appears calm and comfortable  Respiratory system: Clear to auscultation. Respiratory effort normal.  Data Reviewed: I have personally reviewed following labs and imaging studies  CBC: Recent Labs  Lab 05/04/19 0117  WBC 9.0  NEUTROABS 4.7  HGB 13.7  HCT 41.6  MCV 86.3  PLT 824   Basic Metabolic Panel: Recent Labs  Lab 05/04/19 0117  NA 142  K 4.1  CL 105  CO2 25  GLUCOSE 101*  BUN 15  CREATININE 0.81  CALCIUM 9.8   GFR: Estimated Creatinine Clearance: 70.9 mL/min (by C-G formula based on SCr of 0.81 mg/dL). Liver Function Tests: No results for input(s): AST, ALT, ALKPHOS, BILITOT, PROT, ALBUMIN in the last 168 hours. No results for input(s): LIPASE, AMYLASE in the last 168 hours. No results for input(s): AMMONIA in the last 168 hours. Coagulation Profile: No results for input(s): INR, PROTIME in the last 168 hours. Cardiac Enzymes: No results for input(s): CKTOTAL, CKMB, CKMBINDEX, TROPONINI in the last 168 hours. BNP (last 3 results) No  results for input(s): PROBNP in the last 8760 hours. HbA1C: No results for input(s): HGBA1C in the last 72 hours. CBG: No results for input(s): GLUCAP in the last 168 hours. Lipid Profile: No results for input(s): CHOL, HDL, LDLCALC, TRIG, CHOLHDL, LDLDIRECT in the last 72 hours. Thyroid Function Tests: No results for input(s): TSH, T4TOTAL, FREET4, T3FREE, THYROIDAB in the last 72 hours. Anemia Panel: No results for input(s): VITAMINB12, FOLATE, FERRITIN, TIBC, IRON, RETICCTPCT in the last 72 hours. Sepsis Labs: No results for input(s): PROCALCITON, LATICACIDVEN in the last 168 hours.  No results found for this or any previous visit (from the past 240 hour(s)).   Radiology Studies: No results found.  Scheduled Meds: . alum & mag hydroxide-simeth  30 mL Oral Once   And  . lidocaine  15 mL Oral Once  . Aveeno Assurant Treatment  1 packet Topical Daily  . diphenhydrAMINE  25 mg Intravenous Once  . enoxaparin (LOVENOX) injection  40 mg Subcutaneous Q24H  . feeding supplement (ENSURE ENLIVE)  237 mL Oral BID BM  . folic acid  1 mg Oral Daily  . ketoconazole   Topical BID  . loratadine  10 mg Oral Daily  . multivitamin with minerals  1 tablet Oral Daily  . nicotine  14 mg Transdermal Daily  .  polyethylene glycol  17 g Oral BID  . senna-docusate  1 tablet Oral BID  . thiamine  100 mg Oral Daily  . triamcinolone 0.1 % cream : eucerin   Topical BID   Continuous Infusions:   LOS: 148 days   Rickey Barbara, MD [facility] Pager On Amion  If 7PM-7AM, please contact night-coverage 05/10/2019, 3:15 PM

## 2019-05-11 NOTE — Progress Notes (Signed)
PROGRESS NOTE    Marie Woods  WCB:[id number] DOB: [birth year] DOA: 12/13/2018 PCP: Ladell Pier, MD    Brief Narrative:  64 year old African-American female with a past medical history significant for but not limited to asthma, hypertension, alcohol abuse who originally presented on 12/13/2018 for confusion and altered mental status. She was found to have a UTI with urine cultures showing multiple species. MRI of the brain was done and was suggestive of possible Wernicke's encephalopathy. She was treated with thiamine. She subsequently improved and PT OT recommended SNF placement. She is unsafe to discharge home given her mental status fluctuating with underlying dementia and suspected difficulty with memory issues. She will need to be discharged to skilled nursing facility for safe disposition however currently bed is unavailable per social worker and they are in the process of attempting to place the patientbut disposition is very difficult. ALF reviewed her case however she was unable to be accepted due to payment issue. Patient does not have a place to go as her apartment had to be let go due to able to hold it any more so she will need to remain in the hospital until a safe discharge disposition is obtained.  Assessment & Plan:   Principal Problem:   Wernicke encephalopathy Active Problems:   History of alcohol use   TOBACCO ABUSE   Essential hypertension   AMS (altered mental status)   Confusion   Acute metabolic encephalopathy   Agitation   Acute lower UTI   Sepsis ([place])   Sepsis secondary to UTI ([facility])   Normocytic anemia   Severe sepsis 2/2 UTI: Present on Admission, Resolved -Urine culture showed multiple species. Repeat culture showed less than 10,000 colonies.  -Was initially treated with IV Rocephin and fluids, subsequently antibiotics have been discontinued -Currently stable  Acute Metabolic Encephalopathy on likely baseline dementia/wernicke's  encephalopathy, stable CT of the head showed mild atrophic changes without acute intracranial abnormality -MRI of the brain: Symmetric diffusion weighted and T2/FLAIR hyperintense signal abnormality within the medial thalami. Findings favor sequelae of Wernicke's encephalopathy -There was question of patient having alcohol abuse however alcohol level less than 10 -Urine drug screen negative -Completed IV thiamine. Currently on oral thiamine now.  -Continue folic acid, multivitamin. -Outpatient follow-up with Neurology and consider Neuropsychiatry Evaluation -Continue with haloperidol lactate 5 mg IM every 6 hours as needed for Agitation and with lorazepam 1 mg IM every 6 hours as needed for extreme agitation not resolved by the Haldol -Continue with hydroxyzine 25 mg p.o. twice daily as needed for anxiety -Suspect underlying dementia with memory issues.   Essential hypertension -Amlodipine 5 mg po Daily discontinued due to soft BP -BP overall stable  History of Alcohol Abuse -Treated with CIWA protocol, no withdrawals at this time -Continue folate, thiamine and multivitamin.  -Continue with hydroxyzine as above  History of Tobacco Abuse -Continue nicotine patch 14 mg Transdermal patch every 24 hours  Generalized Weakness and Deconditioning -PT/OT recommending SNF. Social worker following. -Patient does need supervision as she appears to have short-term memory impairment.   Xerosis/Eczema -Aveeno soothing bath treatments -Continue with mineral oil-hydrophilic petrolatum topically twice daily as needed for dry skin as well as Hydroscerin cream 2 Applications Topically as Needed fro Dry Skin/Relief/Moisturize and c/w Ketoconazole 2% Cream  DVT prophylaxis: Lovenox subQ Code Status: Full Family Communication: Pt in room, family not at bedside Disposition Plan: From home, plan SNF when bed available per SW  Consultants:   Neurology  Psychiatry  Procedures:  Antimicrobials: Anti-infectives (From admission, onward)   Start     Dose/Rate Route Frequency Ordered Stop   12/14/18 0100  cefTRIAXone (ROCEPHIN) 1 g in sodium chloride 0.9 % 100 mL IVPB  Status:  Discontinued     1 g 200 mL/hr over 30 Minutes Intravenous Every 24 hours 12/14/18 0010 12/17/18 0948      Subjective: Without complaints  Objective: Vitals:   05/10/19 0747 05/10/19 2315 05/11/19 0737 05/11/19 1600  BP: 91/63 (!) 110/99 96/66 (!) 122/91  Pulse: 67 83 65 82  Resp: 18 16 16 17   Temp: 98.4 F (36.9 C) 98 F (36.7 C) 97.9 F (36.6 C) 98.2 F (36.8 C)  TempSrc: Oral Oral    SpO2:  99% 98% 100%  Weight:      Height:       No intake or output data in the 24 hours ending 05/11/19 1642 Filed Weights   12/15/18 0101 02/22/19 0359  Weight: 98.4 kg 67.1 kg    Examination: Respiratory system: normal chest rise, clear, no audible wheezing Central nervous system: No seizures, no tremors  Data Reviewed: I have personally reviewed following labs and imaging studies  CBC: No results for input(s): WBC, NEUTROABS, HGB, HCT, MCV, PLT in the last 168 hours. Basic Metabolic Panel: No results for input(s): NA, K, CL, CO2, GLUCOSE, BUN, CREATININE, CALCIUM, MG, PHOS in the last 168 hours. GFR: Estimated Creatinine Clearance: 70.9 mL/min (by C-G formula based on SCr of 0.81 mg/dL). Liver Function Tests: No results for input(s): AST, ALT, ALKPHOS, BILITOT, PROT, ALBUMIN in the last 168 hours. No results for input(s): LIPASE, AMYLASE in the last 168 hours. No results for input(s): AMMONIA in the last 168 hours. Coagulation Profile: No results for input(s): INR, PROTIME in the last 168 hours. Cardiac Enzymes: No results for input(s): CKTOTAL, CKMB, CKMBINDEX, TROPONINI in the last 168 hours. BNP (last 3 results) No results for input(s): PROBNP in the last 8760 hours. HbA1C: No results for input(s): HGBA1C in the last 72 hours. CBG: No results for input(s): GLUCAP in  the last 168 hours. Lipid Profile: No results for input(s): CHOL, HDL, LDLCALC, TRIG, CHOLHDL, LDLDIRECT in the last 72 hours. Thyroid Function Tests: No results for input(s): TSH, T4TOTAL, FREET4, T3FREE, THYROIDAB in the last 72 hours. Anemia Panel: No results for input(s): VITAMINB12, FOLATE, FERRITIN, TIBC, IRON, RETICCTPCT in the last 72 hours. Sepsis Labs: No results for input(s): PROCALCITON, LATICACIDVEN in the last 168 hours.  No results found for this or any previous visit (from the past 240 hour(s)).   Radiology Studies: No results found.  Scheduled Meds: . alum & mag hydroxide-simeth  30 mL Oral Once   And  . lidocaine  15 mL Oral Once  . Aveeno 02/24/19 Treatment  1 packet Topical Daily  . diphenhydrAMINE  25 mg Intravenous Once  . enoxaparin (LOVENOX) injection  40 mg Subcutaneous Q24H  . feeding supplement (ENSURE ENLIVE)  237 mL Oral BID BM  . folic acid  1 mg Oral Daily  . ketoconazole   Topical BID  . loratadine  10 mg Oral Daily  . multivitamin with minerals  1 tablet Oral Daily  . nicotine  14 mg Transdermal Daily  . polyethylene glycol  17 g Oral BID  . senna-docusate  1 tablet Oral BID  . thiamine  100 mg Oral Daily  . triamcinolone 0.1 % cream : eucerin   Topical BID   Continuous Infusions:   LOS: 149 days   [employer]  Rhona Leavens, MD [facility] Pager On Amion  If 7PM-7AM, please contact night-coverage 05/11/2019, 4:42 PM

## 2019-05-12 NOTE — Progress Notes (Signed)
PROGRESS NOTE    Marie Woods  [id number] DOB: [birth year] DOA: 12/13/2018 PCP: Marcine Matar, MD    Brief Narrative:  64 year old African-American female with a past medical history significant for but not limited to asthma, hypertension, alcohol abuse who originally presented on 12/13/2018 for confusion and altered mental status. She was found to have a UTI with urine cultures showing multiple species. MRI of the brain was done and was suggestive of possible Wernicke's encephalopathy. She was treated with thiamine. She subsequently improved and PT OT recommended SNF placement. She is unsafe to discharge home given her mental status fluctuating with underlying dementia and suspected difficulty with memory issues. She will need to be discharged to skilled nursing facility for safe disposition however currently bed is unavailable per social worker and they are in the process of attempting to place the patientbut disposition is very difficult. ALF reviewed her case however she was unable to be accepted due to payment issue. Patient does not have a place to go as her apartment had to be let go due to able to hold it any more so she will need to remain in the hospital until a safe discharge disposition is obtained.  Assessment & Plan:   Principal Problem:   Wernicke encephalopathy Active Problems:   History of alcohol use   TOBACCO ABUSE   Essential hypertension   AMS (altered mental status)   Confusion   Acute metabolic encephalopathy   Agitation   Acute lower UTI   Sepsis ([facility])   Sepsis secondary to UTI ([facility])   Normocytic anemia   Severe sepsis 2/2 UTI: Present on Admission, Resolved -Urine culture showed multiple species. Repeat culture showed less than 10,000 colonies.  -Was initially treated with IV Rocephin and fluids, subsequently antibiotics have been discontinued -Currently stable, awaiting placement  Acute Metabolic Encephalopathy on likely baseline  dementia/wernicke's encephalopathy, stable CT of the head showed mild atrophic changes without acute intracranial abnormality -MRI of the brain: Symmetric diffusion weighted and T2/FLAIR hyperintense signal abnormality within the medial thalami. Findings favor sequelae of Wernicke's encephalopathy -There was question of patient having alcohol abuse however alcohol level less than 10 -Urine drug screen negative -Completed IV thiamine. Currently on oral thiamine now.  -Continue folic acid, multivitamin. -Outpatient follow-up with Neurology and consider Neuropsychiatry Evaluation -Continue with haloperidol lactate 5 mg IM every 6 hours as needed for Agitation and with lorazepam 1 mg IM every 6 hours as needed for extreme agitation not resolved by the Haldol -Continue with hydroxyzine 25 mg p.o. twice daily as needed for anxiety -Suspect underlying dementia with memory issues.   Essential hypertension -Amlodipine 5 mg po Daily discontinued due to soft BP -BP overall stable  History of Alcohol Abuse -Treated with CIWA protocol, no withdrawals at this time -Continue folate, thiamine and multivitamin.  -Continue with hydroxyzine as above  History of Tobacco Abuse -Continue nicotine patch 14 mg Transdermal patch every 24 hours  Generalized Weakness and Deconditioning -PT/OT recommending SNF. Social worker following. -Patient does need supervision as she appears to have short-term memory impairment.   Xerosis/Eczema -Aveeno soothing bath treatments -Continue with mineral oil-hydrophilic petrolatum topically twice daily as needed for dry skin as well as Hydroscerin cream 2 Applications Topically as Needed fro Dry Skin/Relief/Moisturize and c/w Ketoconazole 2% Cream  DVT prophylaxis: Lovenox subQ Code Status: Full Family Communication: Pt in room, family not at bedside Disposition Plan: From home, plan SNF when bed available per SW  Consultants:    Neurology  Psychiatry  Procedures:     Antimicrobials: Anti-infectives (From admission, onward)   Start     Dose/Rate Route Frequency Ordered Stop   12/14/18 0100  cefTRIAXone (ROCEPHIN) 1 g in sodium chloride 0.9 % 100 mL IVPB  Status:  Discontinued     1 g 200 mL/hr over 30 Minutes Intravenous Every 24 hours 12/14/18 0010 12/17/18 0948      Subjective: No complaints this  AM. Eager to go for a walk outside  Objective: Vitals:   05/11/19 0737 05/11/19 1600 05/12/19 0047 05/12/19 0724  BP: 96/66 (!) 122/91 103/84 (!) 92/58  Pulse: 65 82 72 75  Resp: 16 17 17 16   Temp: 97.9 F (36.6 C) 98.2 F (36.8 C) 98 F (36.7 C) 97.9 F (36.6 C)  TempSrc:   Oral Oral  SpO2: 98% 100% 100% 100%  Weight:      Height:       No intake or output data in the 24 hours ending 05/12/19 1502 Filed Weights   12/15/18 0101 02/22/19 0359  Weight: 98.4 kg 67.1 kg    Examination: General exam: Awake, sitting up eating meal Respiratory system: Normal respiratory effort, no wheezing  Data Reviewed: I have personally reviewed following labs and imaging studies  CBC: No results for input(s): WBC, NEUTROABS, HGB, HCT, MCV, PLT in the last 168 hours. Basic Metabolic Panel: No results for input(s): NA, K, CL, CO2, GLUCOSE, BUN, CREATININE, CALCIUM, MG, PHOS in the last 168 hours. GFR: Estimated Creatinine Clearance: 70.9 mL/min (by C-G formula based on SCr of 0.81 mg/dL). Liver Function Tests: No results for input(s): AST, ALT, ALKPHOS, BILITOT, PROT, ALBUMIN in the last 168 hours. No results for input(s): LIPASE, AMYLASE in the last 168 hours. No results for input(s): AMMONIA in the last 168 hours. Coagulation Profile: No results for input(s): INR, PROTIME in the last 168 hours. Cardiac Enzymes: No results for input(s): CKTOTAL, CKMB, CKMBINDEX, TROPONINI in the last 168 hours. BNP (last 3 results) No results for input(s): PROBNP in the last 8760 hours. HbA1C: No results for  input(s): HGBA1C in the last 72 hours. CBG: No results for input(s): GLUCAP in the last 168 hours. Lipid Profile: No results for input(s): CHOL, HDL, LDLCALC, TRIG, CHOLHDL, LDLDIRECT in the last 72 hours. Thyroid Function Tests: No results for input(s): TSH, T4TOTAL, FREET4, T3FREE, THYROIDAB in the last 72 hours. Anemia Panel: No results for input(s): VITAMINB12, FOLATE, FERRITIN, TIBC, IRON, RETICCTPCT in the last 72 hours. Sepsis Labs: No results for input(s): PROCALCITON, LATICACIDVEN in the last 168 hours.  No results found for this or any previous visit (from the past 240 hour(s)).   Radiology Studies: No results found.  Scheduled Meds: . alum & mag hydroxide-simeth  30 mL Oral Once   And  . lidocaine  15 mL Oral Once  . Aveeno [employer] Treatment  1 packet Topical Daily  . diphenhydrAMINE  25 mg Intravenous Once  . enoxaparin (LOVENOX) injection  40 mg Subcutaneous Q24H  . feeding supplement (ENSURE ENLIVE)  237 mL Oral BID BM  . folic acid  1 mg Oral Daily  . ketoconazole   Topical BID  . loratadine  10 mg Oral Daily  . multivitamin with minerals  1 tablet Oral Daily  . nicotine  14 mg Transdermal Daily  . polyethylene glycol  17 g Oral BID  . senna-docusate  1 tablet Oral BID  . thiamine  100 mg Oral Daily  . triamcinolone 0.1 % cream : eucerin   Topical  BID   Continuous Infusions:   LOS: 150 days   Rickey Barbara, MD [facility] Pager On Amion  If 7PM-7AM, please contact night-coverage 05/12/2019, 3:02 PM

## 2019-05-13 NOTE — Progress Notes (Signed)
PROGRESS NOTE    Marie Woods  [id number] DOB: 1955/04/23 DOA: 12/13/2018 PCP: Ladell Pier, MD    Brief Narrative:  64 year old African-American female with a past medical history significant for but not limited to asthma, hypertension, alcohol abuse who originally presented on 12/13/2018 for confusion and altered mental status. She was found to have a UTI with urine cultures showing multiple species. MRI of the brain was done and was suggestive of possible Wernicke's encephalopathy. She was treated with thiamine. She subsequently improved and PT OT recommended SNF placement. She is unsafe to discharge home given her mental status fluctuating with underlying dementia and suspected difficulty with memory issues. She will need to be discharged to skilled nursing facility for safe disposition however currently bed is unavailable per social worker and they are in the process of attempting to place the patientbut disposition is very difficult. ALF reviewed her case however she was unable to be accepted due to payment issue. Patient does not have a place to go as her apartment had to be let go due to able to hold it any more so she will need to remain in the hospital until a safe discharge disposition is obtained.  Assessment & Plan:   Principal Problem:   Wernicke encephalopathy Active Problems:   History of alcohol use   TOBACCO ABUSE   Essential hypertension   AMS (altered mental status)   Confusion   Acute metabolic encephalopathy   Agitation   Acute lower UTI   Sepsis ([place])   Sepsis secondary to UTI ([facility])   Normocytic anemia   Severe sepsis 2/2 UTI: Present on Admission, Resolved -Urine culture showed multiple species. Repeat culture showed less than 10,000 colonies.  -Was initially treated with IV Rocephin and fluids, subsequently antibiotics have been discontinued -Currently stable, still awaiting placement  Acute Metabolic Encephalopathy on likely baseline  dementia/wernicke's encephalopathy, stable CT of the head showed mild atrophic changes without acute intracranial abnormality -MRI of the brain: Symmetric diffusion weighted and T2/FLAIR hyperintense signal abnormality within the medial thalami. Findings favor sequelae of Wernicke's encephalopathy -There was question of patient having alcohol abuse however alcohol level less than 10 -Urine drug screen negative -Completed IV thiamine. Currently on oral thiamine now.  -Continue folic acid, multivitamin. -Outpatient follow-up with Neurology and consider Neuropsychiatry Evaluation -Continue with haloperidol lactate 5 mg IM every 6 hours as needed for Agitation and with lorazepam 1 mg IM every 6 hours as needed for extreme agitation not resolved by the Haldol -Continue with hydroxyzine 25 mg p.o. twice daily as needed for anxiety -Suspect underlying dementia with memory issues.   Essential hypertension -Amlodipine 5 mg po Daily discontinued due to soft BP -BP overall stable  History of Alcohol Abuse -Treated with CIWA protocol, no withdrawals at this time -Continue folate, thiamine and multivitamin.  -Continue with hydroxyzine as above  History of Tobacco Abuse -Continue nicotine patch 14 mg Transdermal patch every 24 hours  Generalized Weakness and Deconditioning -PT/OT recommending SNF. Social worker following. -Patient does need supervision as she appears to have short-term memory impairment.   Xerosis/Eczema -Aveeno soothing bath treatments -Continue with mineral oil-hydrophilic petrolatum topically twice daily as needed for dry skin as well as Hydroscerin cream 2 Applications Topically as Needed fro Dry Skin/Relief/Moisturize and c/w Ketoconazole 2% Cream  DVT prophylaxis: Lovenox subQ Code Status: Full Family Communication: Pt in room, family not at bedside Disposition Plan: From home, plan SNF when bed available per SW  Consultants:    Neurology  Psychiatry  Procedures:     Antimicrobials: Anti-infectives (From admission, onward)   Start     Dose/Rate Route Frequency Ordered Stop   12/14/18 0100  cefTRIAXone (ROCEPHIN) 1 g in sodium chloride 0.9 % 100 mL IVPB  Status:  Discontinued     1 g 200 mL/hr over 30 Minutes Intravenous Every 24 hours 12/14/18 0010 12/17/18 0948      Subjective: Without complaints this AM  Objective: Vitals:   05/12/19 0724 05/12/19 1711 05/12/19 1900 05/13/19 0817  BP: (!) 92/58 (!) 165/81 (!) 152/76 92/77  Pulse: 75 61 (!) 58 63  Resp: 16 16 14 16   Temp: 97.9 F (36.6 C) 98.5 F (36.9 C) 97.8 F (36.6 C) 98.9 F (37.2 C)  TempSrc: Oral Oral Oral Oral  SpO2: 100% 99% 99% 96%  Weight:      Height:        Intake/Output Summary (Last 24 hours) at 05/13/2019 1433 Last data filed at 05/12/2019 1500 Gross per 24 hour  Intake 360 ml  Output --  Net 360 ml   Filed Weights   12/15/18 0101 02/22/19 0359  Weight: 98.4 kg 67.1 kg    Examination: General exam: Conversant, in no acute distress, was asleep but woken up Respiratory system: normal chest rise, clear, no audible wheezing  Data Reviewed: I have personally reviewed following labs and imaging studies  CBC: No results for input(s): WBC, NEUTROABS, HGB, HCT, MCV, PLT in the last 168 hours. Basic Metabolic Panel: No results for input(s): NA, K, CL, CO2, GLUCOSE, BUN, CREATININE, CALCIUM, MG, PHOS in the last 168 hours. GFR: Estimated Creatinine Clearance: 70.9 mL/min (by C-G formula based on SCr of 0.81 mg/dL). Liver Function Tests: No results for input(s): AST, ALT, ALKPHOS, BILITOT, PROT, ALBUMIN in the last 168 hours. No results for input(s): LIPASE, AMYLASE in the last 168 hours. No results for input(s): AMMONIA in the last 168 hours. Coagulation Profile: No results for input(s): INR, PROTIME in the last 168 hours. Cardiac Enzymes: No results for input(s): CKTOTAL, CKMB, CKMBINDEX, TROPONINI in the last 168  hours. BNP (last 3 results) No results for input(s): PROBNP in the last 8760 hours. HbA1C: No results for input(s): HGBA1C in the last 72 hours. CBG: No results for input(s): GLUCAP in the last 168 hours. Lipid Profile: No results for input(s): CHOL, HDL, LDLCALC, TRIG, CHOLHDL, LDLDIRECT in the last 72 hours. Thyroid Function Tests: No results for input(s): TSH, T4TOTAL, FREET4, T3FREE, THYROIDAB in the last 72 hours. Anemia Panel: No results for input(s): VITAMINB12, FOLATE, FERRITIN, TIBC, IRON, RETICCTPCT in the last 72 hours. Sepsis Labs: No results for input(s): PROCALCITON, LATICACIDVEN in the last 168 hours.  No results found for this or any previous visit (from the past 240 hour(s)).   Radiology Studies: No results found.  Scheduled Meds: . alum & mag hydroxide-simeth  30 mL Oral Once   And  . lidocaine  15 mL Oral Once  . Aveeno 02/24/19 Treatment  1 packet Topical Daily  . diphenhydrAMINE  25 mg Intravenous Once  . enoxaparin (LOVENOX) injection  40 mg Subcutaneous Q24H  . feeding supplement (ENSURE ENLIVE)  237 mL Oral BID BM  . folic acid  1 mg Oral Daily  . ketoconazole   Topical BID  . loratadine  10 mg Oral Daily  . multivitamin with minerals  1 tablet Oral Daily  . nicotine  14 mg Transdermal Daily  . polyethylene glycol  17 g Oral BID  . senna-docusate  1 tablet Oral BID  . thiamine  100 mg Oral Daily  . triamcinolone 0.1 % cream : eucerin   Topical BID   Continuous Infusions:   LOS: 151 days   Rickey Barbara, MD [facility] Pager On Amion  If 7PM-7AM, please contact night-coverage 05/13/2019, 2:33 PM

## 2019-05-14 NOTE — Progress Notes (Signed)
PROGRESS NOTE    Marie Woods  VQQ:[id number] DOB: Oct 27, 1955 DOA: 12/13/2018 PCP: Marcine Matar, MD    Brief Narrative:  64 year old African-American female with a past medical history significant for but not limited to asthma, hypertension, alcohol abuse who originally presented on 12/13/2018 for confusion and altered mental status. She was found to have a UTI with urine cultures showing multiple species. MRI of the brain was done and was suggestive of possible Wernicke's encephalopathy. She was treated with thiamine. She subsequently improved and PT OT recommended SNF placement. She is unsafe to discharge home given her mental status fluctuating with underlying dementia and suspected difficulty with memory issues. She will need to be discharged to skilled nursing facility for safe disposition however currently bed is unavailable per social worker and they are in the process of attempting to place the patientbut disposition is very difficult. ALF reviewed her case however she was unable to be accepted due to payment issue. Patient does not have a place to go as her apartment had to be let go due to able to hold it any more so she will need to remain in the hospital until a safe discharge disposition is obtained.  Assessment & Plan:   Principal Problem:   Wernicke encephalopathy Active Problems:   History of alcohol use   TOBACCO ABUSE   Essential hypertension   AMS (altered mental status)   Confusion   Acute metabolic encephalopathy   Agitation   Acute lower UTI   Sepsis (HCC)   Sepsis secondary to UTI (HCC)   Normocytic anemia   Severe sepsis 2/2 UTI: Present on Admission, Resolved -Urine culture showed multiple species. Repeat culture showed less than 10,000 colonies.  -Was initially treated with IV Rocephin and fluids, subsequently antibiotics have been discontinued -Currently stable, still awaiting placement  Acute Metabolic Encephalopathy on likely baseline  dementia/wernicke's encephalopathy, stable CT of the head showed mild atrophic changes without acute intracranial abnormality -MRI of the brain: Symmetric diffusion weighted and T2/FLAIR hyperintense signal abnormality within the medial thalami. Findings favor sequelae of Wernicke's encephalopathy -There was question of patient having alcohol abuse however alcohol level noted to be less than 10 -Urine drug screen negative -Completed IV thiamine. Currently on oral thiamine now.  -Continue folic acid, multivitamin. -Outpatient follow-up with Neurology and consider Neuropsychiatry Evaluation -Continue with haloperidol lactate 5 mg IM every 6 hours as needed for Agitation and with lorazepam 1 mg IM every 6 hours as needed for extreme agitation not resolved by the Haldol -Continue with hydroxyzine 25 mg p.o. twice daily as needed for anxiety -Suspect underlying dementia with memory issues.   Essential hypertension -Amlodipine 5 mg po Daily discontinued due to soft BP -BP overall stable  History of Alcohol Abuse -Treated with CIWA protocol, no withdrawals at this time -Continue folate, thiamine and multivitamin.  -Continue with hydroxyzine as above  History of Tobacco Abuse -Continue nicotine patch 14 mg Transdermal patch every 24 hours  Generalized Weakness and Deconditioning -PT/OT recommending SNF. Social worker following. -Patient does need supervision as she appears to have short-term memory impairment.   Xerosis/Eczema -Aveeno soothing bath treatments -Continue with mineral oil-hydrophilic petrolatum topically twice daily as needed for dry skin as well as Hydroscerin cream 2 Applications Topically as Needed fro Dry Skin/Relief/Moisturize and c/w Ketoconazole 2% Cream  DVT prophylaxis: Lovenox subQ Code Status: Full Family Communication: Pt in room, family not at bedside Disposition Plan: From home, plan SNF when bed available per SW  Consultants:  Neurology  Psychiatry  Procedures:     Antimicrobials: Anti-infectives (From admission, onward)   Start     Dose/Rate Route Frequency Ordered Stop   12/14/18 0100  cefTRIAXone (ROCEPHIN) 1 g in sodium chloride 0.9 % 100 mL IVPB  Status:  Discontinued     1 g 200 mL/hr over 30 Minutes Intravenous Every 24 hours 12/14/18 0010 12/17/18 0948      Subjective: Without complaints  Objective: Vitals:   05/13/19 0817 05/13/19 1544 05/14/19 0004 05/14/19 0902  BP: 92/77 100/70 97/70 90/65   Pulse: 63 69 65 67  Resp: 16 16 16 16   Temp: 98.9 F (37.2 C) 98.3 F (36.8 C) 98.5 F (36.9 C) 98.2 F (36.8 C)  TempSrc: Oral Oral Oral   SpO2: 96% 100% 97% 99%  Weight:      Height:       No intake or output data in the 24 hours ending 05/14/19 1415 Filed Weights   12/15/18 0101 02/22/19 0359  Weight: 98.4 kg 67.1 kg    Examination: General exam: Conversant, in no acute distress Respiratory system: normal chest rise, clear, no audible wheezing  Data Reviewed: I have personally reviewed following labs and imaging studies  CBC: No results for input(s): WBC, NEUTROABS, HGB, HCT, MCV, PLT in the last 168 hours. Basic Metabolic Panel: No results for input(s): NA, K, CL, CO2, GLUCOSE, BUN, CREATININE, CALCIUM, MG, PHOS in the last 168 hours. GFR: Estimated Creatinine Clearance: 70.9 mL/min (by C-G formula based on SCr of 0.81 mg/dL). Liver Function Tests: No results for input(s): AST, ALT, ALKPHOS, BILITOT, PROT, ALBUMIN in the last 168 hours. No results for input(s): LIPASE, AMYLASE in the last 168 hours. No results for input(s): AMMONIA in the last 168 hours. Coagulation Profile: No results for input(s): INR, PROTIME in the last 168 hours. Cardiac Enzymes: No results for input(s): CKTOTAL, CKMB, CKMBINDEX, TROPONINI in the last 168 hours. BNP (last 3 results) No results for input(s): PROBNP in the last 8760 hours. HbA1C: No results for input(s): HGBA1C in the last 72  hours. CBG: No results for input(s): GLUCAP in the last 168 hours. Lipid Profile: No results for input(s): CHOL, HDL, LDLCALC, TRIG, CHOLHDL, LDLDIRECT in the last 72 hours. Thyroid Function Tests: No results for input(s): TSH, T4TOTAL, FREET4, T3FREE, THYROIDAB in the last 72 hours. Anemia Panel: No results for input(s): VITAMINB12, FOLATE, FERRITIN, TIBC, IRON, RETICCTPCT in the last 72 hours. Sepsis Labs: No results for input(s): PROCALCITON, LATICACIDVEN in the last 168 hours.  No results found for this or any previous visit (from the past 240 hour(s)).   Radiology Studies: No results found.  Scheduled Meds: . alum & mag hydroxide-simeth  30 mL Oral Once   And  . lidocaine  15 mL Oral Once  . Aveeno 02/24/19 Treatment  1 packet Topical Daily  . diphenhydrAMINE  25 mg Intravenous Once  . enoxaparin (LOVENOX) injection  40 mg Subcutaneous Q24H  . feeding supplement (ENSURE ENLIVE)  237 mL Oral BID BM  . folic acid  1 mg Oral Daily  . ketoconazole   Topical BID  . loratadine  10 mg Oral Daily  . multivitamin with minerals  1 tablet Oral Daily  . nicotine  14 mg Transdermal Daily  . polyethylene glycol  17 g Oral BID  . senna-docusate  1 tablet Oral BID  . thiamine  100 mg Oral Daily  . triamcinolone 0.1 % cream : eucerin   Topical BID   Continuous Infusions:  LOS: 152 days   Marylu Lund, MD [facility] Pager On Amion  If 7PM-7AM, please contact night-coverage 05/14/2019, 2:15 PM

## 2019-05-15 NOTE — Progress Notes (Signed)
PROGRESS NOTE    Marie Woods  ZOX:[id number] DOB: 09-04-1955 DOA: 12/13/2018 PCP: Marcine Matar, MD    Brief Narrative:  65 year old African-American female with a past medical history significant for but not limited to asthma, hypertension, alcohol abuse who originally presented on 12/13/2018 for confusion and altered mental status. She was found to have a UTI with urine cultures showing multiple species. MRI of the brain was done and was suggestive of possible Wernicke's encephalopathy. She was treated with thiamine. She subsequently improved and PT OT recommended SNF placement. She is unsafe to discharge home given her mental status fluctuating with underlying dementia and suspected difficulty with memory issues. She will need to be discharged to skilled nursing facility for safe disposition however currently bed is unavailable per social worker and they are in the process of attempting to place the patientbut disposition is very difficult. ALF reviewed her case however she was unable to be accepted due to payment issue. Patient does not have a place to go as her apartment had to be let go due to able to hold it any more so she will need to remain in the hospital until a safe discharge disposition is obtained.  Assessment & Plan:   Principal Problem:   Wernicke encephalopathy Active Problems:   History of alcohol use   TOBACCO ABUSE   Essential hypertension   AMS (altered mental status)   Confusion   Acute metabolic encephalopathy   Agitation   Acute lower UTI   Sepsis (HCC)   Sepsis secondary to UTI (HCC)   Normocytic anemia   Severe sepsis 2/2 UTI: Present on Admission, Resolved -Urine culture showed multiple species. Repeat culture showed less than 10,000 colonies.  -Was initially treated with IV Rocephin and fluids, subsequently antibiotics have been discontinued -Currently stable, cont to await placement  Acute Metabolic Encephalopathy on likely baseline  dementia/wernicke's encephalopathy, stable CT of the head showed mild atrophic changes without acute intracranial abnormality -MRI of the brain: Symmetric diffusion weighted and T2/FLAIR hyperintense signal abnormality within the medial thalami. Findings favor sequelae of Wernicke's encephalopathy -There was question of patient having alcohol abuse however alcohol level noted to be less than 10 -Urine drug screen negative -Completed IV thiamine. Currently on oral thiamine now.  -Continue folic acid, multivitamin. -Outpatient follow-up with Neurology and consider Neuropsychiatry Evaluation -Continue with haloperidol lactate 5 mg IM every 6 hours as needed for Agitation and with lorazepam 1 mg IM every 6 hours as needed for extreme agitation not resolved by the Haldol -Continue with hydroxyzine 25 mg p.o. twice daily as needed for anxiety -Suspect underlying dementia with memory issues.   Essential hypertension -Amlodipine 5 mg po Daily discontinued due to soft BP -BP stable at this time  History of Alcohol Abuse -Treated with CIWA protocol, no withdrawals at this time -Continue folate, thiamine and multivitamin.  -Continue with hydroxyzine as above  History of Tobacco Abuse -Continue nicotine patch 14 mg Transdermal patch every 24 hours  Generalized Weakness and Deconditioning -PT/OT recommending SNF. Social worker following. -Patient does need supervision as she appears to have short-term memory impairment.   Xerosis/Eczema -Aveeno soothing bath treatments -Continue with mineral oil-hydrophilic petrolatum topically twice daily as needed for dry skin as well as Hydroscerin cream 2 Applications Topically as Needed fro Dry Skin/Relief/Moisturize and c/w Ketoconazole 2% Cream  DVT prophylaxis: Lovenox subQ Code Status: Full Family Communication: Pt in room, family not at bedside Disposition Plan: From home, plan SNF when bed available per SW  Consultants:    Neurology  Psychiatry  Procedures:     Antimicrobials: Anti-infectives (From admission, onward)   Start     Dose/Rate Route Frequency Ordered Stop   12/14/18 0100  cefTRIAXone (ROCEPHIN) 1 g in sodium chloride 0.9 % 100 mL IVPB  Status:  Discontinued     1 g 200 mL/hr over 30 Minutes Intravenous Every 24 hours 12/14/18 0010 12/17/18 0948      Subjective: No complaints this AM  Objective: Vitals:   05/14/19 0902 05/14/19 1836 05/14/19 2043 05/15/19 0801  BP: 90/65 116/89 101/73 110/70  Pulse: 67 83 81 70  Resp: 16 17 18 16   Temp: 98.2 F (36.8 C)  98.5 F (36.9 C) 98.5 F (36.9 C)  TempSrc:   Oral   SpO2: 99% 100% 100% 99%  Weight:      Height:        Intake/Output Summary (Last 24 hours) at 05/15/2019 1515 Last data filed at 05/15/2019 0715 Gross per 24 hour  Intake 240 ml  Output --  Net 240 ml   Filed Weights   12/15/18 0101 02/22/19 0359  Weight: 98.4 kg 67.1 kg    Examination: General exam: Asleep but arousable, laying in bed, in nad Respiratory system: Normal respiratory effort, no wheezing  Data Reviewed: I have personally reviewed following labs and imaging studies  CBC: No results for input(s): WBC, NEUTROABS, HGB, HCT, MCV, PLT in the last 168 hours. Basic Metabolic Panel: No results for input(s): NA, K, CL, CO2, GLUCOSE, BUN, CREATININE, CALCIUM, MG, PHOS in the last 168 hours. GFR: Estimated Creatinine Clearance: 70.9 mL/min (by C-G formula based on SCr of 0.81 mg/dL). Liver Function Tests: No results for input(s): AST, ALT, ALKPHOS, BILITOT, PROT, ALBUMIN in the last 168 hours. No results for input(s): LIPASE, AMYLASE in the last 168 hours. No results for input(s): AMMONIA in the last 168 hours. Coagulation Profile: No results for input(s): INR, PROTIME in the last 168 hours. Cardiac Enzymes: No results for input(s): CKTOTAL, CKMB, CKMBINDEX, TROPONINI in the last 168 hours. BNP (last 3 results) No results for input(s): PROBNP in the  last 8760 hours. HbA1C: No results for input(s): HGBA1C in the last 72 hours. CBG: No results for input(s): GLUCAP in the last 168 hours. Lipid Profile: No results for input(s): CHOL, HDL, LDLCALC, TRIG, CHOLHDL, LDLDIRECT in the last 72 hours. Thyroid Function Tests: No results for input(s): TSH, T4TOTAL, FREET4, T3FREE, THYROIDAB in the last 72 hours. Anemia Panel: No results for input(s): VITAMINB12, FOLATE, FERRITIN, TIBC, IRON, RETICCTPCT in the last 72 hours. Sepsis Labs: No results for input(s): PROCALCITON, LATICACIDVEN in the last 168 hours.  No results found for this or any previous visit (from the past 240 hour(s)).   Radiology Studies: No results found.  Scheduled Meds: . Aveeno 02/24/19 Treatment  1 packet Topical Daily  . diphenhydrAMINE  25 mg Intravenous Once  . enoxaparin (LOVENOX) injection  40 mg Subcutaneous Q24H  . feeding supplement (ENSURE ENLIVE)  237 mL Oral BID BM  . folic acid  1 mg Oral Daily  . ketoconazole   Topical BID  . loratadine  10 mg Oral Daily  . multivitamin with minerals  1 tablet Oral Daily  . nicotine  14 mg Transdermal Daily  . polyethylene glycol  17 g Oral BID  . senna-docusate  1 tablet Oral BID  . thiamine  100 mg Oral Daily  . triamcinolone 0.1 % cream : eucerin   Topical BID   Continuous  Infusions:   LOS: 153 days   Marylu Lund, MD [facility] Pager On Amion  If 7PM-7AM, please contact night-coverage 05/15/2019, 3:15 PM

## 2019-05-15 NOTE — TOC Progression Note (Signed)
Transition of Care [facility]) - Progression Note    Patient Details  Name: Marie Woods MRN: [id number] Date of Birth: 1955-05-19  Transition of Care [facility]) CM/SW Contact  Mearl Latin, LCSW Phone Number: 05/15/2019, 2:22 PM  Clinical Narrative:    CSW re-submitted clinicals to [facility] for review. No current bed offers.    Expected Discharge Plan: Skilled Nursing Facility Barriers to Discharge: No SNF bed, SNF Pending payor source - LOG  Expected Discharge Plan and Services Expected Discharge Plan: Skilled Nursing Facility In-house Referral: Clinical Social Work Discharge Planning Services: NA Post Acute Care Choice: Skilled Nursing Facility Living arrangements for the past 2 months: Apartment                 DME Arranged: N/A DME Agency: NA                   Social Determinants of Health (SDOH) Interventions    Readmission Risk Interventions No flowsheet data found.

## 2019-05-16 NOTE — Progress Notes (Signed)
PROGRESS NOTE    Marie Woods  YQM:[id number] DOB: 1955/12/06 DOA: 12/13/2018 PCP: Ladell Pier, MD    Brief Narrative:  64 year old African-American female with a past medical history significant for but not limited to asthma, hypertension, alcohol abuse who originally presented on 12/13/2018 for confusion and altered mental status. She was found to have a UTI with urine cultures showing multiple species. MRI of the brain was done and was suggestive of possible Wernicke's encephalopathy. She was treated with thiamine. She subsequently improved and PT OT recommended SNF placement. She is unsafe to discharge home given her mental status fluctuating with underlying dementia and suspected difficulty with memory issues. She will need to be discharged to skilled nursing facility for safe disposition however currently bed is unavailable per social worker and they are in the process of attempting to place the patientbut disposition is very difficult. ALF reviewed her case however she was unable to be accepted due to payment issue. Patient does not have a place to go as her apartment had to be let go due to able to hold it any more so she will need to remain in the hospital until a safe discharge disposition is obtained.  Assessment & Plan:   Principal Problem:   Wernicke encephalopathy Active Problems:   History of alcohol use   TOBACCO ABUSE   Essential hypertension   AMS (altered mental status)   Confusion   Acute metabolic encephalopathy   Agitation   Acute lower UTI   Sepsis ([place])   Sepsis secondary to UTI (HCC)   Normocytic anemia   Severe sepsis 2/2 UTI: Present on Admission, Resolved -Urine culture showed multiple species. Repeat culture showed less than 10,000 colonies.  -Was initially treated with IV Rocephin and fluids, subsequently antibiotics have been discontinued -Currently stable, still awaiting placement  Acute Metabolic Encephalopathy on likely baseline  dementia/wernicke's encephalopathy, stable CT of the head showed mild atrophic changes without acute intracranial abnormality -MRI of the brain: Symmetric diffusion weighted and T2/FLAIR hyperintense signal abnormality within the medial thalami. Findings favor sequelae of Wernicke's encephalopathy -There was question of patient having alcohol abuse however alcohol level noted to be less than 10 -Urine drug screen negative -Completed IV thiamine. Currently on oral thiamine now.  -Continue folic acid, multivitamin. -Outpatient follow-up with Neurology and consider Neuropsychiatry Evaluation -Continue with haloperidol lactate 5 mg IM every 6 hours as needed for Agitation and with lorazepam 1 mg IM every 6 hours as needed for extreme agitation not resolved by the Haldol -Continue with hydroxyzine 25 mg p.o. twice daily as needed for anxiety -Suspect underlying dementia with memory issues.   Essential hypertension -Amlodipine 5 mg po Daily discontinued due to soft BP -BP stable at this time  History of Alcohol Abuse -Treated with CIWA protocol, no withdrawals at this time -Continue folate, thiamine and multivitamin.  -Continue with hydroxyzine as above  History of Tobacco Abuse -Continue nicotine patch 14 mg Transdermal patch every 24 hours  Generalized Weakness and Deconditioning -PT/OT recommending SNF. Social worker following. -Patient does need supervision as she appears to have short-term memory impairment.   Xerosis/Eczema -Aveeno soothing bath treatments -Continue with mineral oil-hydrophilic petrolatum topically twice daily as needed for dry skin as well as Hydroscerin cream 2 Applications Topically as Needed fro Dry Skin/Relief/Moisturize and c/w Ketoconazole 2% Cream  DVT prophylaxis: Lovenox subQ Code Status: Full Family Communication: Pt in room, family not at bedside Disposition Plan: From home, plan SNF when bed available per SW  Consultants:  Neurology  Psychiatry  Procedures:     Antimicrobials: Anti-infectives (From admission, onward)   Start     Dose/Rate Route Frequency Ordered Stop   12/14/18 0100  cefTRIAXone (ROCEPHIN) 1 g in sodium chloride 0.9 % 100 mL IVPB  Status:  Discontinued     1 g 200 mL/hr over 30 Minutes Intravenous Every 24 hours 12/14/18 0010 12/17/18 0948      Subjective: Without complaints. Eager to go for a walk today outside  Objective: Vitals:   05/15/19 1549 05/15/19 1600 05/15/19 2350 05/16/19 0812  BP: (!) 134/115 104/73 97/77 101/67  Pulse: (!) 119 86 75 88  Resp: 16   16  Temp: 97.9 F (36.6 C)  98 F (36.7 C) 98.9 F (37.2 C)  TempSrc:   Oral Oral  SpO2: 100%  97% 96%  Weight:      Height:       No intake or output data in the 24 hours ending 05/16/19 1422 Filed Weights   12/15/18 0101 02/22/19 0359  Weight: 98.4 kg 67.1 kg    Examination: General exam: Conversant, in no acute distress Respiratory system: normal chest rise, clear, no audible wheezing  Data Reviewed: I have personally reviewed following labs and imaging studies  CBC: No results for input(s): WBC, NEUTROABS, HGB, HCT, MCV, PLT in the last 168 hours. Basic Metabolic Panel: No results for input(s): NA, K, CL, CO2, GLUCOSE, BUN, CREATININE, CALCIUM, MG, PHOS in the last 168 hours. GFR: Estimated Creatinine Clearance: 70.9 mL/min (by C-G formula based on SCr of 0.81 mg/dL). Liver Function Tests: No results for input(s): AST, ALT, ALKPHOS, BILITOT, PROT, ALBUMIN in the last 168 hours. No results for input(s): LIPASE, AMYLASE in the last 168 hours. No results for input(s): AMMONIA in the last 168 hours. Coagulation Profile: No results for input(s): INR, PROTIME in the last 168 hours. Cardiac Enzymes: No results for input(s): CKTOTAL, CKMB, CKMBINDEX, TROPONINI in the last 168 hours. BNP (last 3 results) No results for input(s): PROBNP in the last 8760 hours. HbA1C: No results for input(s): HGBA1C  in the last 72 hours. CBG: No results for input(s): GLUCAP in the last 168 hours. Lipid Profile: No results for input(s): CHOL, HDL, LDLCALC, TRIG, CHOLHDL, LDLDIRECT in the last 72 hours. Thyroid Function Tests: No results for input(s): TSH, T4TOTAL, FREET4, T3FREE, THYROIDAB in the last 72 hours. Anemia Panel: No results for input(s): VITAMINB12, FOLATE, FERRITIN, TIBC, IRON, RETICCTPCT in the last 72 hours. Sepsis Labs: No results for input(s): PROCALCITON, LATICACIDVEN in the last 168 hours.  No results found for this or any previous visit (from the past 240 hour(s)).   Radiology Studies: No results found.  Scheduled Meds: . Aveeno [employer] Treatment  1 packet Topical Daily  . diphenhydrAMINE  25 mg Intravenous Once  . enoxaparin (LOVENOX) injection  40 mg Subcutaneous Q24H  . feeding supplement (ENSURE ENLIVE)  237 mL Oral BID BM  . folic acid  1 mg Oral Daily  . ketoconazole   Topical BID  . loratadine  10 mg Oral Daily  . multivitamin with minerals  1 tablet Oral Daily  . nicotine  14 mg Transdermal Daily  . polyethylene glycol  17 g Oral BID  . senna-docusate  1 tablet Oral BID  . thiamine  100 mg Oral Daily  . triamcinolone 0.1 % cream : eucerin   Topical BID   Continuous Infusions:   LOS: 154 days   Rickey Barbara, MD [facility] Pager On Amion  If 7PM-7AM, please contact night-coverage 05/16/2019, 2:22 PM

## 2019-05-16 NOTE — Plan of Care (Signed)

## 2019-05-16 NOTE — TOC Progression Note (Addendum)
Transition of Care [facility]) - Progression Note    Patient Details  Name: Marie Woods MRN: [id number] Date of Birth: [birth year]  [facility] CM/SW Contact  Mearl Latin, LCSW Phone Number: 05/16/2019, 10:22 AM  Clinical Narrative:    CSW received return call from [place] at [facility]. CSW answered her questions and provided patient's DSS Medicaid worker contact info. Blondie stated that she would like to speak with the Medicaid worker before offering patient a bed on a Letter of Guarantee with the hospital. She will follow up with CSW once she hears back from DSS.    Expected Discharge Plan: Skilled Nursing Facility Barriers to Discharge: No SNF bed, SNF Pending payor source - LOG  Expected Discharge Plan and Services Expected Discharge Plan: Skilled Nursing Facility In-house Referral: Clinical Social Work Discharge Planning Services: NA Post Acute Care Choice: Skilled Nursing Facility Living arrangements for the past 2 months: Apartment                 DME Arranged: N/A DME Agency: NA                   Social Determinants of Health (SDOH) Interventions    Readmission Risk Interventions No flowsheet data found.

## 2019-05-17 LAB — SARS CORONAVIRUS 2 (TAT 6-24 HRS): SARS Coronavirus 2: NEGATIVE

## 2019-05-17 NOTE — [place] FL2 (Signed)
[place] MEDICAID FL2 LEVEL OF CARE SCREENING TOOL     IDENTIFICATION  Patient Name: Marie Woods Birthdate: Oct 04, 1955 Sex: female Admission Date (Current Location): 12/13/2018  [facility] and [place] Number:  [place] Medicaid Pending Facility and Address:  [facility], [address], [place], [place] [address]      Provider Number: [id number]  Attending Physician Name and Address:  Leatha Gilding, MD  Relative Name and Phone Number:  Logan Bores [phone]/Rhonda E[place], Niece, [phone]    Current Level of Care: Hospital Recommended Level of Care: Assisted Living Facility Prior Approval Number:    Date Approved/Denied:   PASRR Number:   Discharge Plan: Other (Comment)(ALF)    Current Diagnoses: Patient Active Problem List   Diagnosis Date Noted  . Wernicke encephalopathy 03/30/2019  . Sepsis secondary to UTI (HCC)   . Normocytic anemia   . Acute metabolic encephalopathy   . Agitation   . Acute lower UTI   . Sepsis (HCC)   . Confusion   . AMS (altered mental status) 12/13/2018  . Chronic pain of left knee 04/12/2017  . Chronic pain of right knee 04/12/2017  . ASCUS with positive high risk HPV cervical 05/23/2015  . Healthcare maintenance 03/28/2015  . Alopecia 06/13/2014  . DEPRESSION 04/03/2010  . TOBACCO ABUSE 06/28/2009  . ALLERGIC RHINITIS 06/28/2009  . History of alcohol use 01/10/2009  . Essential hypertension 01/10/2009  . Asthma 01/10/2009  . Dermatitis, atopic 01/10/2009    Orientation RESPIRATION BLADDER Height & Weight     Self, Situation, Place  Normal Continent Weight: 148 lb (67.1 kg) Height:  5\' 7"  (170.2 cm)  BEHAVIORAL SYMPTOMS/MOOD NEUROLOGICAL BOWEL NUTRITION STATUS  (None)   Continent Diet(Regular)  AMBULATORY STATUS COMMUNICATION OF NEEDS Skin   Supervision Verbally Normal                       Personal Care Assistance Level of Assistance  Bathing, Feeding, Dressing Bathing  Assistance: Limited assistance Feeding assistance: Independent Dressing Assistance: Limited assistance Total Care Assistance: Independent   Functional Limitations Info  Sight, Hearing, Speech Sight Info: Impaired Hearing Info: Adequate Speech Info: Adequate    SPECIAL CARE FACTORS FREQUENCY  PT (By licensed PT)     PT Frequency: 3x/week OT Frequency: 5x/wk            Contractures Contractures Info: Not present    Additional Factors Info  Code Status, Allergies Code Status Info: Full Code Allergies Info: No Known Allergies           Current Medications (05/17/2019):  This is the current hospital active medication list Current Facility-Administered Medications  Medication Dose Route Frequency Provider Last Rate Last Admin  . acetaminophen (TYLENOL) tablet 650 mg  650 mg Oral Q6H PRN 05/19/2019, MD   650 mg at 05/17/19 1018   Or  . acetaminophen (TYLENOL) suppository 650 mg  650 mg Rectal Q6H PRN 05/19/19, MD      . albuterol (PROVENTIL) (2.5 MG/3ML) 0.083% nebulizer solution 2.5 mg  2.5 mg Nebulization Q4H PRN Jae Dire, MD   2.5 mg at 03/05/19 0943  . alum & mag hydroxide-simeth (MAALOX/MYLANTA) 200-200-20 MG/5ML suspension 30 mL  30 mL Oral Q6H PRN 03/07/19 D, MD   30 mL at 04/13/19 1946  . Aveeno 06/13/19 Treatment 1 packet  1 packet Topical Daily [employer] [place], Asprogia   1 packet at 05/17/19 1005  . calcium carbonate (  TUMS - dosed in mg elemental calcium) chewable tablet 200 mg of elemental calcium  1 tablet Oral TID PRN Patrecia Pour, MD   200 mg of elemental calcium at 05/01/19 1538  . camphor-menthol (SARNA) lotion   Topical PRN Radene Gunning, NP   Given at 01/28/19 [phone]  . diphenhydrAMINE (BENADRYL) capsule 25 mg  25 mg Oral Q6H PRN Dessa Phi, DO   25 mg at 04/25/19 2050  . diphenhydrAMINE (BENADRYL) injection 25 mg  25 mg Intravenous Once Sheikh, Omair Latif, DO      . enoxaparin (LOVENOX) injection 40 mg  40 mg Subcutaneous  Q24H Gala Romney L, MD   40 mg at 05/17/19 1005  . feeding supplement (ENSURE ENLIVE) (ENSURE ENLIVE) liquid 237 mL  237 mL Oral BID BM Adele Barthel D, MD   237 mL at 05/16/19 1331  . folic acid (FOLVITE) tablet 1 mg  1 mg Oral Daily Mikhail, Ponce, DO   1 mg at 05/17/19 1006  . guaiFENesin-dextromethorphan (ROBITUSSIN DM) 100-10 MG/5ML syrup 5 mL  5 mL Oral Q4H PRN Starla Link, Kshitiz, MD   5 mL at 05/16/19 2319  . haloperidol lactate (HALDOL) injection 5 mg  5 mg Intramuscular Q6H PRN Aline August, MD   5 mg at 02/18/19 1740  . hydrocerin (EUCERIN) cream 2 application  2 application Topical PRN Blain Pais, MD   2 application at 25/36/64 0847  . hydrOXYzine (ATARAX/VISTARIL) tablet 25 mg  25 mg Oral BID PRN Eulogio Bear U, DO   25 mg at 05/06/19 2204  . ketoconazole (NIZORAL) 2 % cream   Topical BID Harold Hedge, MD   Given at 05/17/19 1005  . loratadine (CLARITIN) tablet 10 mg  10 mg Oral Daily Raiford Noble [place], DO   10 mg at 05/17/19 1006  . LORazepam (ATIVAN) injection 1 mg  1 mg Intramuscular Q6H PRN Starla Link, Kshitiz, MD      . meclizine (ANTIVERT) tablet 12.5 mg  12.5 mg Oral TID PRN Adele Barthel D, MD   12.5 mg at 01/09/19 1218  . menthol-cetylpyridinium (CEPACOL) lozenge 3 mg  1 lozenge Oral PRN Marylyn Ishihara, Tyrone A, DO   3 mg at 05/08/19 4034  . mineral oil-hydrophilic petrolatum (AQUAPHOR) ointment   Topical BID PRN Dessa Phi, DO   Given at 04/19/19 0913  . multivitamin with minerals tablet 1 tablet  1 tablet Oral Daily Cristal Ford, DO   1 tablet at 05/17/19 1006  . nicotine (NICODERM CQ - dosed in mg/24 hours) patch 14 mg  14 mg Transdermal Daily Pokhrel, Laxman, MD   14 mg at 05/17/19 1004  . ondansetron (ZOFRAN) tablet 4 mg  4 mg Oral Q6H PRN Elwyn Reach, MD   4 mg at 02/28/19 1628   Or  . ondansetron (ZOFRAN) injection 4 mg  4 mg Intravenous Q6H PRN Gala Romney L, MD      . phenol (CHLORASEPTIC) mouth spray 1 spray  1 spray Mouth/Throat PRN  Cristal Ford, DO   1 spray at 05/08/19 0018  . polyethylene glycol (MIRALAX / GLYCOLAX) packet 17 g  17 g Oral BID Raiford Noble [place], DO   17 g at 05/17/19 1006  . senna-docusate (Senokot-S) tablet 1 tablet  1 tablet Oral BID Raiford Noble [place], DO   1 tablet at 05/17/19 1006  . thiamine (VITAMIN B-1) tablet 100 mg  100 mg Oral Daily Starla Link, Kshitiz, MD   100 mg at 05/17/19 1006  . traMADol (ULTRAM) tablet  50 mg  50 mg Oral Q6H PRN Glade Lloyd, MD   50 mg at 04/21/19 1229  . traZODone (DESYREL) tablet 50 mg  50 mg Oral QHS PRN Amin, Ankit Chirag, MD   50 mg at 05/05/19 2014  . triamcinolone 0.1 % cream : eucerin cream, 1:1   Topical BID Marguerita Merles [place], DO   Given at 05/17/19 1007  . white petrolatum (VASELINE) gel   Topical PRN Edsel Petrin, DO   0.2 application at 04/26/19 6962     Discharge Medications: Please see discharge summary for a list of discharge medications.  Relevant Imaging Results:  Relevant Lab Results:   Additional Information SSN: [id number]; COVID negative                         Medicaid and Disability application has been applied for in [facility] [place], [place]

## 2019-05-17 NOTE — TOC Progression Note (Signed)
Transition of Care [facility]) - Progression Note    Patient Details  Name: Marie Woods MRN: [id number] Date of Birth: 11/12/1955  Transition of [facility] CM/SW Contact  Mearl Latin, LCSW Phone Number: 05/17/2019, 3:01 PM  Clinical Narrative:    CSW received call from [facility] [employer]. She reports she was able to speak with patient's Medicaid worker and though it is still pending, they will accept an LOG from the hospital and can accept patient on Friday. CSW made [facility] Director aware and submitted LOG form to be faxed to the ALF [phone]). CSW requested updated COVID test and is faxing updated FL2 and TB test to facility.   CSW made patient aware of facility plans. 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 CSW will update her sister, Jamesetta So.   Expected Discharge Plan: Skilled Nursing Facility Barriers to Discharge: No SNF bed, SNF Pending payor source - LOG  Expected Discharge Plan and Services Expected Discharge Plan: Skilled Nursing Facility In-house Referral: Clinical Social Work Discharge Planning Services: NA Post Acute Care Choice: Skilled Nursing Facility Living arrangements for the past 2 months: Apartment                 DME Arranged: N/A DME Agency: NA                   Social Determinants of Health (SDOH) Interventions    Readmission Risk Interventions No flowsheet data found.

## 2019-05-17 NOTE — Progress Notes (Signed)
PROGRESS NOTE    REGENE MCCARTHY  XTK:[id number] DOB: 1955-10-13 DOA: 12/13/2018 PCP: Marcine Matar, MD   Brief Narrative:  The patient is a 64 year old African-American female with a past medical history significant for but not limited to asthma, hypertension, alcohol abuse who originally presented on 12/13/2018 for confusion and altered mental status.  She was found to have a UTI with urine cultures showing multiple species.  MRI of the brain was done and was suggestive of possible Wernicke's encephalopathy. She was treated with thiamine.  She subsequently improved and PT OT recommended SNF placement.  She is unsafe to discharge home given her mental status fluctuating with underlying dementia and suspected difficulty with memory issues.  She will need to be discharged to skilled nursing facility for safe disposition however currently bed is unavailable per social worker and they are in the process of attempting to place the patient but disposition is very difficult. ALF reviewed her case however she was unable to be accepted due to payment issue. Patient does not have a place to go as her apartment had to be let go due to able to hold it any more so she will need to remain in the hospital until a safe discharge disposition is obtained.  Subjective: Wants to get up and walk, asking for a mask.  No complaints otherwise  Assessment & Plan: Principal Problem:   Wernicke encephalopathy Active Problems:   History of alcohol use   TOBACCO ABUSE   Essential hypertension   AMS (altered mental status)   Confusion   Acute metabolic encephalopathy   Agitation   Acute lower UTI   Sepsis (HCC)   Sepsis secondary to UTI (HCC)   Normocytic anemia  Severe sepsis 2/2 UTI: Present on Admission UTI -Urine culture showed multiple species. Repeat culture showed less than 10,000 colonies.  -Was initially treated with IV Rocephin and fluids.  -Subsequently antibiotics have been  discontinued. -Sepsis physiology has resolved and she is hemodynamically stable -Chest x-ray was negative for infection. -Hemodynamically stable  Acute Metabolic Encephalopathy Possible Wernicke's Encephalopathy CT of the head showed mild atrophic changes without acute intracranial abnormality -MRI of the brain: Symmetric diffusion weighted and T2/FLAIR hyperintense signal abnormality within the medial thalami. Findings favor sequelae of Wernicke's encephalopathy -There was question of patient having alcohol abuse however alcohol level less than 10.  -Urine drug screen negative -Completed IV thiamine. Currently on oral thiamine now.  -Continue folic acid, multivitamin. -Outpatient follow-up with Neurology and consider Neuropsychiatry Evaluation -Continue with haloperidol lactate 5 mg IM every 6 hours as needed for Agitation and with lorazepam 1 mg IM every 6 hours as needed for extreme agitation not resolved by the Haldol -Continue with hydroxyzine 25 mg p.o. twice daily as needed for anxiety -Mental status has been fluctuating. Suspect underlying dementia with memory issues.   Essential hypertension -Amlodipine 5 mg po Daily discontinued due to soft BP -BP overall stable  History of Alcohol Abuse -Treated with CIWA protocol.  -Continue folate, thiamine and multivitamin.  -Continue with hydroxyzine as above  History of Tobacco Abuse -Continue nicotine patch 14 mg Transdermal patch every 24 hours  Generalized Weakness and Deconditioning -PT/OT recommending SNF. Social worker following.  Apparently she may have a bed this week.  Repeat Covid -Patient does need supervision as she appears to have short-term memory impairment.   Normocytic Anemia -Patient's hemoglobin/hematocrit was 12.6/38.5 on last check (04/03/19) -Labs stable on 3/18, repeat 3/24  Chest tightness/shortness of breath and dizziness -Resolved, likely  transient  -Was not orthostatic    Hyperglycemia -Mild at 101 on 2/23 but since then has improved and was 96 on 04/03/2019 -Continue to Monitor and Trend  -Labs stable on 3/18  Constipation -Started Bowel Regimen with Miralax 17 grams po BID and with Senna-Docusate 1 tab po BID  Xerosis/Eczema -Aveeno soothing bath treatments -Continue with mineral oil-hydrophilic petrolatum topically twice daily as needed for dry skin as well as Hydroscerin cream 2 Applications Topically as Needed fro Dry Skin/Relief/Moisturize and c/w Ketoconazole 2% Cream  Scheduled Meds: . Aveeno [employer] Treatment  1 packet Topical Daily  . diphenhydrAMINE  25 mg Intravenous Once  . enoxaparin (LOVENOX) injection  40 mg Subcutaneous Q24H  . feeding supplement (ENSURE ENLIVE)  237 mL Oral BID BM  . folic acid  1 mg Oral Daily  . ketoconazole   Topical BID  . loratadine  10 mg Oral Daily  . multivitamin with minerals  1 tablet Oral Daily  . nicotine  14 mg Transdermal Daily  . polyethylene glycol  17 g Oral BID  . senna-docusate  1 tablet Oral BID  . thiamine  100 mg Oral Daily  . triamcinolone 0.1 % cream : eucerin   Topical BID   Continuous Infusions: PRN Meds:.acetaminophen **OR** acetaminophen, albuterol, alum & mag hydroxide-simeth, calcium carbonate, camphor-menthol, diphenhydrAMINE, guaiFENesin-dextromethorphan, haloperidol lactate, hydrocerin, hydrOXYzine, LORazepam, meclizine, menthol-cetylpyridinium, mineral oil-hydrophilic petrolatum, ondansetron **OR** ondansetron (ZOFRAN) IV, phenol, traMADol, traZODone, white petrolatum   DVT prophylaxis: Lovenox Code Status: FULL CODE Family Communication: No family present at bedside  Disposition Plan: SNF when bed available  Consultants:   Neurology  Psychiatry    Procedures: None   Antimicrobials:  Anti-infectives (From admission, onward)   Start     Dose/Rate Route Frequency Ordered Stop   12/14/18 0100  cefTRIAXone (ROCEPHIN) 1 g in sodium chloride 0.9 % 100 mL IVPB   Status:  Discontinued     1 g 200 mL/hr over 30 Minutes Intravenous Every 24 hours 12/14/18 0010 12/17/18 0948      Objective: Vitals:   05/15/19 2350 05/16/19 0812 05/16/19 2325 05/17/19 0759  BP: 97/77 101/67 (!) 145/83 94/68  Pulse: 75 88 78 68  Resp:  16 20 20   Temp: 98 F (36.7 C) 98.9 F (37.2 C) 98.1 F (36.7 C) 98.7 F (37.1 C)  TempSrc: Oral Oral  Oral  SpO2: 97% 96% 96% 98%  Weight:      Height:       No intake or output data in the 24 hours ending 05/17/19 1348 Filed Weights   12/15/18 0101 02/22/19 0359  Weight: 98.4 kg 67.1 kg   Examination: Physical Exam:  Constitutional: NAD Vitals:   05/15/19 2350 05/16/19 0812 05/16/19 2325 05/17/19 0759  BP: 97/77 101/67 (!) 145/83 94/68  Pulse: 75 88 78 68  Resp:  16 20 20   Temp: 98 F (36.7 C) 98.9 F (37.2 C) 98.1 F (36.7 C) 98.7 F (37.1 C)  TempSrc: Oral Oral  Oral  SpO2: 97% 96% 96% 98%  Weight:      Height:       Data Reviewed: I have personally reviewed following labs and imaging studies  CBC: No results for input(s): WBC, NEUTROABS, HGB, HCT, MCV, PLT in the last 168 hours. Basic Metabolic Panel: No results for input(s): NA, K, CL, CO2, GLUCOSE, BUN, CREATININE, CALCIUM, MG, PHOS in the last 168 hours. GFR: Estimated Creatinine Clearance: 70.9 mL/min (by C-G formula based on SCr of 0.81 mg/dL). Liver Function  Tests: No results for input(s): AST, ALT, ALKPHOS, BILITOT, PROT, ALBUMIN in the last 168 hours. No results for input(s): LIPASE, AMYLASE in the last 168 hours. No results for input(s): AMMONIA in the last 168 hours. Coagulation Profile: No results for input(s): INR, PROTIME in the last 168 hours. Cardiac Enzymes: No results for input(s): CKTOTAL, CKMB, CKMBINDEX, TROPONINI in the last 168 hours. BNP (last 3 results) No results for input(s): PROBNP in the last 8760 hours. HbA1C: No results for input(s): HGBA1C in the last 72 hours. CBG: No results for input(s): GLUCAP in the last 168  hours. Lipid Profile: No results for input(s): CHOL, HDL, LDLCALC, TRIG, CHOLHDL, LDLDIRECT in the last 72 hours. Thyroid Function Tests: No results for input(s): TSH, T4TOTAL, FREET4, T3FREE, THYROIDAB in the last 72 hours. Anemia Panel: No results for input(s): VITAMINB12, FOLATE, FERRITIN, TIBC, IRON, RETICCTPCT in the last 72 hours. Sepsis Labs: No results for input(s): PROCALCITON, LATICACIDVEN in the last 168 hours.  No results found for this or any previous visit (from the past 240 hour(s)).   RN Pressure Injury Documentation:     Estimated body mass index is 23.18 kg/m as calculated from the following:   Height as of this encounter: 5\' 7"  (1.702 m).   Weight as of this encounter: 67.1 kg.  Malnutrition Type:  Nutrition Problem: Increased nutrient needs Etiology: acute illness   Malnutrition Characteristics:  Signs/Symptoms: estimated needs   Nutrition Interventions:  Interventions: Ensure Enlive (each supplement provides 350kcal and 20 grams of protein), MVI  Radiology Studies: No results found.  Scheduled Meds: . Aveeno [employer] Treatment  1 packet Topical Daily  . diphenhydrAMINE  25 mg Intravenous Once  . enoxaparin (LOVENOX) injection  40 mg Subcutaneous Q24H  . feeding supplement (ENSURE ENLIVE)  237 mL Oral BID BM  . folic acid  1 mg Oral Daily  . ketoconazole   Topical BID  . loratadine  10 mg Oral Daily  . multivitamin with minerals  1 tablet Oral Daily  . nicotine  14 mg Transdermal Daily  . polyethylene glycol  17 g Oral BID  . senna-docusate  1 tablet Oral BID  . thiamine  100 mg Oral Daily  . triamcinolone 0.1 % cream : eucerin   Topical BID   Continuous Infusions:   LOS: 155 days   Marzetta Board, MD [facility] PAGER is on [place]  If 7PM-7AM, please contact night-coverage [email or web address]

## 2019-05-17 NOTE — Plan of Care (Signed)
  Problem: Education: Goal: Knowledge of General Education information will improve Description: Including pain rating scale, medication(s)/side effects and non-pharmacologic comfort measures Outcome: Progressing  Patient participating in planning skin care to alleviate dryness and itching.  Problem: Activity: Goal: Risk for activity intolerance will decrease Outcome: Progressing  Patient walking in hallway, uses walker.

## 2019-05-18 NOTE — Progress Notes (Signed)
PROGRESS NOTE    Marie Woods  WUJ:[id number] DOB: [birth year] DOA: 12/13/2018 PCP: Marcine Matar, MD   Brief Narrative:  The patient is a 64 year old African-American female with a past medical history significant for but not limited to asthma, hypertension, alcohol abuse who originally presented on 12/13/2018 for confusion and altered mental status.  She was found to have a UTI with urine cultures showing multiple species.  MRI of the brain was done and was suggestive of possible Wernicke's encephalopathy. She was treated with thiamine.  She subsequently improved and PT OT recommended SNF placement.  She is unsafe to discharge home given her mental status fluctuating with underlying dementia and suspected difficulty with memory issues.  She will need to be discharged to skilled nursing facility for safe disposition however currently bed is unavailable per social worker and they are in the process of attempting to place the patient but disposition is very difficult. ALF reviewed her case however she was unable to be accepted due to payment issue. Patient does not have a place to go as her apartment had to be let go due to able to hold it any more so she will need to remain in the hospital until a safe discharge disposition is obtained.  Subjective: No complaints, walking with a walker in the hallway  Assessment & Plan: Principal Problem:   Wernicke encephalopathy Active Problems:   History of alcohol use   TOBACCO ABUSE   Essential hypertension   AMS (altered mental status)   Confusion   Acute metabolic encephalopathy   Agitation   Acute lower UTI   Sepsis (HCC)   Sepsis secondary to UTI (HCC)   Normocytic anemia  Severe sepsis 2/2 UTI: Present on Admission UTI -Urine culture showed multiple species. Repeat culture showed less than 10,000 colonies.  -Was initially treated with IV Rocephin and fluids.  -Subsequently antibiotics have been discontinued. -Sepsis physiology has  resolved and she is hemodynamically stable -Chest x-ray was negative for infection. -Hemodynamically stable  Acute Metabolic Encephalopathy Possible Wernicke's Encephalopathy CT of the head showed mild atrophic changes without acute intracranial abnormality -MRI of the brain: Symmetric diffusion weighted and T2/FLAIR hyperintense signal abnormality within the medial thalami. Findings favor sequelae of Wernicke's encephalopathy -There was question of patient having alcohol abuse however alcohol level less than 10.  -Urine drug screen negative -Completed IV thiamine. Currently on oral thiamine now.  -Continue folic acid, multivitamin. -Outpatient follow-up with Neurology and consider Neuropsychiatry Evaluation -Continue with haloperidol lactate 5 mg IM every 6 hours as needed for Agitation and with lorazepam 1 mg IM every 6 hours as needed for extreme agitation not resolved by the Haldol -Continue with hydroxyzine 25 mg p.o. twice daily as needed for anxiety -Mental status has been fluctuating. Suspect underlying dementia with memory issues.   Essential hypertension -Amlodipine 5 mg po Daily discontinued due to soft BP -BP overall stable  History of Alcohol Abuse -Treated with CIWA protocol.  -Continue folate, thiamine and multivitamin.  -Continue with hydroxyzine as above  History of Tobacco Abuse -Continue nicotine patch 14 mg Transdermal patch every 24 hours  Generalized Weakness and Deconditioning -PT/OT recommending SNF. Social worker following.  Apparently she may have a bed this week.  Repeat Covid -Patient does need supervision as she appears to have short-term memory impairment.   Normocytic Anemia -Patient's hemoglobin/hematocrit was 12.6/38.5 on last check (04/03/19) -Labs stable on 3/18, repeat 3/24  Chest tightness/shortness of breath and dizziness -Resolved, likely transient  -Was not orthostatic  Hyperglycemia -Mild at 101 on 2/23 but since then  has improved and was 96 on 04/03/2019 -Continue to Monitor and Trend  -Labs stable on 3/18  Constipation -Started Bowel Regimen with Miralax 17 grams po BID and with Senna-Docusate 1 tab po BID  Xerosis/Eczema -Aveeno soothing bath treatments -Continue with mineral oil-hydrophilic petrolatum topically twice daily as needed for dry skin as well as Hydroscerin cream 2 Applications Topically as Needed fro Dry Skin/Relief/Moisturize and c/w Ketoconazole 2% Cream  Scheduled Meds: . Aveeno Assurant Treatment  1 packet Topical Daily  . diphenhydrAMINE  25 mg Intravenous Once  . enoxaparin (LOVENOX) injection  40 mg Subcutaneous Q24H  . feeding supplement (ENSURE ENLIVE)  237 mL Oral BID BM  . folic acid  1 mg Oral Daily  . ketoconazole   Topical BID  . loratadine  10 mg Oral Daily  . multivitamin with minerals  1 tablet Oral Daily  . nicotine  14 mg Transdermal Daily  . polyethylene glycol  17 g Oral BID  . senna-docusate  1 tablet Oral BID  . thiamine  100 mg Oral Daily  . triamcinolone 0.1 % cream : eucerin   Topical BID   Continuous Infusions: PRN Meds:.acetaminophen **OR** acetaminophen, albuterol, alum & mag hydroxide-simeth, calcium carbonate, camphor-menthol, diphenhydrAMINE, guaiFENesin-dextromethorphan, haloperidol lactate, hydrocerin, hydrOXYzine, LORazepam, meclizine, menthol-cetylpyridinium, mineral oil-hydrophilic petrolatum, ondansetron **OR** ondansetron (ZOFRAN) IV, phenol, traMADol, traZODone, white petrolatum   DVT prophylaxis: Lovenox Code Status: FULL CODE Family Communication: No family present at bedside  Disposition Plan: SNF when bed available  Consultants:   Neurology  Psychiatry    Procedures: None   Antimicrobials:  Anti-infectives (From admission, onward)   Start     Dose/Rate Route Frequency Ordered Stop   12/14/18 0100  cefTRIAXone (ROCEPHIN) 1 g in sodium chloride 0.9 % 100 mL IVPB  Status:  Discontinued     1 g 200 mL/hr over 30 Minutes  Intravenous Every 24 hours 12/14/18 0010 12/17/18 0948      Objective: Vitals:   05/16/19 2325 05/17/19 0759 05/17/19 2300 05/18/19 1134  BP: (!) 145/83 94/68 95/68  128/85  Pulse: 78 68 69   Resp: 20 20 14 16   Temp: 98.1 F (36.7 C) 98.7 F (37.1 C) 98 F (36.7 C) 98.7 F (37.1 C)  TempSrc:  Oral Oral   SpO2: 96% 98% 96%   Weight:      Height:        Intake/Output Summary (Last 24 hours) at 05/18/2019 1202 Last data filed at 05/18/2019 0800 Gross per 24 hour  Intake 240 ml  Output --  Net 240 ml   Filed Weights   12/15/18 0101 02/22/19 0359  Weight: 98.4 kg 67.1 kg   Examination: Physical Exam:  Constitutional: No distress Vitals:   05/16/19 2325 05/17/19 0759 05/17/19 2300 05/18/19 1134  BP: (!) 145/83 94/68 95/68  128/85  Pulse: 78 68 69   Resp: 20 20 14 16   Temp: 98.1 F (36.7 C) 98.7 F (37.1 C) 98 F (36.7 C) 98.7 F (37.1 C)  TempSrc:  Oral Oral   SpO2: 96% 98% 96%   Weight:      Height:       Data Reviewed: I have personally reviewed following labs and imaging studies  CBC: No results for input(s): WBC, NEUTROABS, HGB, HCT, MCV, PLT in the last 168 hours. Basic Metabolic Panel: No results for input(s): NA, K, CL, CO2, GLUCOSE, BUN, CREATININE, CALCIUM, MG, PHOS in the last 168 hours. GFR: Estimated Creatinine  Clearance: 70.9 mL/min (by C-G formula based on SCr of 0.81 mg/dL). Liver Function Tests: No results for input(s): AST, ALT, ALKPHOS, BILITOT, PROT, ALBUMIN in the last 168 hours. No results for input(s): LIPASE, AMYLASE in the last 168 hours. No results for input(s): AMMONIA in the last 168 hours. Coagulation Profile: No results for input(s): INR, PROTIME in the last 168 hours. Cardiac Enzymes: No results for input(s): CKTOTAL, CKMB, CKMBINDEX, TROPONINI in the last 168 hours. BNP (last 3 results) No results for input(s): PROBNP in the last 8760 hours. HbA1C: No results for input(s): HGBA1C in the last 72 hours. CBG: No results for  input(s): GLUCAP in the last 168 hours. Lipid Profile: No results for input(s): CHOL, HDL, LDLCALC, TRIG, CHOLHDL, LDLDIRECT in the last 72 hours. Thyroid Function Tests: No results for input(s): TSH, T4TOTAL, FREET4, T3FREE, THYROIDAB in the last 72 hours. Anemia Panel: No results for input(s): VITAMINB12, FOLATE, FERRITIN, TIBC, IRON, RETICCTPCT in the last 72 hours. Sepsis Labs: No results for input(s): PROCALCITON, LATICACIDVEN in the last 168 hours.  Recent Results (from the past 240 hour(s))  SARS CORONAVIRUS 2 (TAT 6-24 HRS) Nasopharyngeal Nasopharyngeal Swab     Status: None   Collection Time: 05/17/19 10:29 AM   Specimen: Nasopharyngeal Swab  Result Value Ref Range Status   SARS Coronavirus 2 NEGATIVE NEGATIVE Final    Comment: (NOTE) SARS-CoV-2 target nucleic acids are NOT DETECTED. The SARS-CoV-2 RNA is generally detectable in upper and lower respiratory specimens during the acute phase of infection. Negative results do not preclude SARS-CoV-2 infection, do not rule out co-infections with other pathogens, and should not be used as the sole basis for treatment or other patient management decisions. Negative results must be combined with clinical observations, patient history, and epidemiological information. The expected result is Negative. Fact Sheet for Patients: HairSlick.no Fact Sheet for Healthcare Providers: quierodirigir.com This test is not yet approved or cleared by the [place] FDA and  has been authorized for detection and/or diagnosis of SARS-CoV-2 by FDA under an Emergency Use Authorization (EUA). This EUA will remain  in effect (meaning this test can be used) for the duration of the COVID-19 declaration under Section 56 4(b)(1) of the Act, 21 U.S.C. section 360bbb-3(b)(1), unless the authorization is terminated or revoked sooner. Performed at [facility], [address], [place],  [place] [address]      RN Pressure Injury Documentation:     Estimated body mass index is 23.18 kg/m as calculated from the following:   Height as of this encounter: 5\' 7"  (1.702 m).   Weight as of this encounter: 67.1 kg.  Malnutrition Type:  Nutrition Problem: Increased nutrient needs Etiology: acute illness   Malnutrition Characteristics:  Signs/Symptoms: estimated needs   Nutrition Interventions:  Interventions: Ensure Enlive (each supplement provides 350kcal and 20 grams of protein), MVI  Radiology Studies: No results found.  Scheduled Meds: . Aveeno Treatment  1 packet Topical Daily  . diphenhydrAMINE  25 mg Intravenous Once  . enoxaparin (LOVENOX) injection  40 mg Subcutaneous Q24H  . feeding supplement (ENSURE ENLIVE)  237 mL Oral BID BM  . folic acid  1 mg Oral Daily  . ketoconazole   Topical BID  . loratadine  10 mg Oral Daily  . multivitamin with minerals  1 tablet Oral Daily  . nicotine  14 mg Transdermal Daily  . polyethylene glycol  17 g Oral BID  . senna-docusate  1 tablet Oral BID  . thiamine  100 mg Oral Daily  . triamcinolone 0.1 % cream : eucerin   Topical BID   Continuous Infusions:   LOS: 156 days   Pamella Pert, MD [facility] PAGER is on AMION  If 7PM-7AM, please contact night-coverage [email or web address]

## 2019-05-18 NOTE — TOC Progression Note (Signed)
Transition of Care [facility]) - Progression Note    Patient Details  Name: Marie Woods MRN: [id number] Date of Birth: 1956-01-08  Transition of Care [facility]) CM/SW Contact  Ranelle Oyster, Student-Social Work Phone Number: 05/18/2019, 9:51 AM  Clinical Narrative:    CSW contacted pt's sister Beatris Si to give her an update on an ALF that can accept pt Friday [facility]). Jamesetta So was very appreciative and accepting to this decision. All questions were answered and toc team will continue with discharge plans.    Expected Discharge Plan: Skilled Nursing Facility Barriers to Discharge: No SNF bed, SNF Pending payor source - LOG  Expected Discharge Plan and Services Expected Discharge Plan: Skilled Nursing Facility In-house Referral: Clinical Social Work Discharge Planning Services: NA Post Acute Care Choice: Skilled Nursing Facility Living arrangements for the past 2 months: Apartment                 DME Arranged: N/A DME Agency: NA                   Social Determinants of Health (SDOH) Interventions    Readmission Risk Interventions No flowsheet data found.

## 2019-05-19 MED ORDER — NICOTINE 14 MG/24HR TD PT24: 14.0000 mg | MEDICATED_PATCH | Freq: Every day | TRANSDERMAL | 0 refills | Status: DC

## 2019-05-19 MED ORDER — POLYETHYLENE GLYCOL 3350 17 G PO PACK: 17.0000 g | PACK | Freq: Two times a day (BID) | ORAL | 0 refills | Status: DC

## 2019-05-19 MED ORDER — HYDROCERIN EX CREA: 2.0000 "application " | TOPICAL_CREAM | CUTANEOUS | 0 refills | Status: DC | PRN

## 2019-05-19 MED ORDER — AQUAPHOR EX OINT: TOPICAL_OINTMENT | Freq: Two times a day (BID) | CUTANEOUS | 0 refills | Status: DC | PRN

## 2019-05-19 MED ORDER — ALBUTEROL SULFATE HFA 108 (90 BASE) MCG/ACT IN AERS: 2.0000 | INHALATION_SPRAY | Freq: Four times a day (QID) | RESPIRATORY_TRACT | 1 refills | Status: DC | PRN

## 2019-05-19 MED ORDER — THIAMINE HCL 100 MG PO TABS: 100.0000 mg | ORAL_TABLET | Freq: Every day | ORAL | 0 refills | Status: DC

## 2019-05-19 MED ORDER — ADULT MULTIVITAMIN W/MINERALS CH: 1.0000 | ORAL_TABLET | Freq: Every day | ORAL | 0 refills | Status: DC

## 2019-05-19 MED ORDER — KETOCONAZOLE 2 % EX CREA: TOPICAL_CREAM | Freq: Two times a day (BID) | CUTANEOUS | 0 refills | Status: DC

## 2019-05-19 MED ORDER — FOLIC ACID 1 MG PO TABS: 1.0000 mg | ORAL_TABLET | Freq: Every day | ORAL | 0 refills | Status: DC

## 2019-05-19 MED ORDER — HYDROXYZINE HCL 25 MG PO TABS: 25.0000 mg | ORAL_TABLET | Freq: Two times a day (BID) | ORAL | 0 refills | Status: DC | PRN

## 2019-05-19 MED ORDER — TRAMADOL HCL 50 MG PO TABS: 50.0000 mg | ORAL_TABLET | Freq: Four times a day (QID) | ORAL | 0 refills | Status: DC | PRN

## 2019-05-19 MED ORDER — TRAZODONE HCL 50 MG PO TABS: 50.0000 mg | ORAL_TABLET | Freq: Every evening | ORAL | 0 refills | Status: DC | PRN

## 2019-05-19 NOTE — TOC Transition Note (Signed)
Transition of Care [facility]) - CM/SW Discharge Note   Patient Details  Name: Marie Woods MRN: [id number] Date of Birth: 12-06-1955  [facility]) CM/SW Contact:  Mearl Latin, LCSW Phone Number: 05/19/2019, 12:38 PM   Clinical Narrative:    Patient will DC to: [facility] Anticipated DC date: 05/19/19 Family notified: Sister, Jamesetta So Transport by: [facility] (Safe transport) 1pm   Per MD patient ready for DC to [facility]). RN, patient, patient's family, and facility notified of DC. Discharge Summary sent to facility. CSW confirmed receipt by [facility] Lurena Joiner). RN to call report prior to discharge [phone]).  CSW will sign off for now as social work intervention is no longer needed. Please consult [place] again if new needs arise.      Final next level of care: Assisted Living Barriers to Discharge: Barriers Resolved   Patient Goals and CMS Choice Patient states their goals for this hospitalization and ongoing recovery are:: Pt niece wants her aunt to feel better CMS Medicare.gov Compare Post Acute Care list provided to:: Other (Comment Required) Choice offered to / list presented to : (Patient's Niece)  Discharge Placement                       Discharge Plan and Services In-house Referral: Clinical Social Work Discharge Planning Services: NA Post Acute Care Choice: Skilled Nursing Facility          DME Arranged: N/A DME Agency: NA                  Social Determinants of Health (SDOH) Interventions     Readmission Risk Interventions No flowsheet data found.

## 2019-05-19 NOTE — TOC Progression Note (Signed)
Transition of Care [facility]) - Progression Note    Patient Details  Name: Marie Woods MRN: [id number] Date of Birth: 12/10/1955  Transition of Care [facility]) CM/SW Contact  Mearl Latin, LCSW Phone Number: 05/19/2019, 9:21 AM  Clinical Narrative:    [facility] confirmed receipt of LOG for patient and stated they would expect her on Friday.   CSW received message from RN that patient stated she would not go to [facility] due to it being too far for her family to visit. CSW and MSW intern went to speak with patient. Patient asked where she would be going. CSW provided information again. Patient asked for info several more times throughout the conversation and asked if someone would drop off her things. Patient never expressed to CSW that she would not go to the facility. CSW assured her that her sister would help arrange getting her clothing to the facility. Patient reported agreement. CSW repeated conversation several times with the patient; it was clear that patient's short term memory was an issue.    Expected Discharge Plan: Skilled Nursing Facility Barriers to Discharge: No SNF bed, SNF Pending payor source - LOG  Expected Discharge Plan and Services Expected Discharge Plan: Skilled Nursing Facility In-house Referral: Clinical Social Work Discharge Planning Services: NA Post Acute Care Choice: Skilled Nursing Facility Living arrangements for the past 2 months: Apartment                 DME Arranged: N/A DME Agency: NA                   Social Determinants of Health (SDOH) Interventions    Readmission Risk Interventions No flowsheet data found.

## 2019-05-19 NOTE — Discharge Summary (Signed)
Physician Discharge Summary  Marie Woods BJY:[id number] DOB: 1955-06-02 DOA: 12/13/2018  PCP: Marcine Matar, MD  Admit date: 12/13/2018 Discharge date: 05/19/2019  Admitted From: home Disposition:  ALF  Recommendations for Outpatient Follow-up:  1. Follow up with PCP in 1-2 weeks  Home Health: none  Equipment/Devices: walker  Discharge Condition: stable CODE STATUS: Full code Diet recommendation: regular  HPI: Per admitting MD, Marie Woods is a 64 y.o. female with medical history significant of asthma, hypertension, alcohol abuse who was brought in by EMS after neighbors called in stating that patient is confused and altered.  She is currently disoriented and not able to give coherent history.  Most history therefore was obtained from the neighbors who assisted EMS.  Has been sick gradually over a month.  Apparently.  She has however been coherent able to communicate without any known problems.  Today however she is not herself.  She told me she thinks she is somewhere in the house not in the hospital.  She is not tremulous and she is not aggressive.  Her alcohol level was negative.  No reported drug use.  Patient is being admitted with altered mental status for work-up.  So far no UTI or pneumonia identified.[name]  Hospital Course / Discharge diagnoses: Severe sepsis 2/2 UTI: Present on Admission UTI -Urine culture showed multiple species. Repeat culture showed less than 10,000 colonies. Was initially treated with IV Rocephin and fluids, sepsis physiology has resolved and she has hemodynamically stable  Acute Metabolic Encephalopathy Possible Wernicke's Encephalopathy CT of the head showed mild atrophic changes without acute intracranial abnormality. MRI of the brain: Symmetric diffusion weighted and T2/FLAIR hyperintense signal abnormality within the medial thalami. Findings favor sequelae of Wernicke's encephalopathy.  Completed treatment with IV thiamine and now  currently on p.o. There was question of patient having alcohol abuse however alcohol level less than 10. Urine drug screen negative. Outpatient follow-up with Neurology and consider Neuropsychiatry Evaluation Essential hypertension -Amlodipine 5 mg po Daily discontinued due to soft BP. BP overall stable History of Alcohol Abuse -Treated with CIWA protocol. Continue folate, thiamine and multivitamin.  History of Tobacco Abuse -Continue nicotine patch 14 mg Transdermal patch every 24 hours Normocytic Anemia -labs stable Chest tightness/shortness of breath and dizziness -Resolved, likely transient  Constipation -bowel regimen Xerosis/Eczema -she had Aveeno soothing bath treatments, mineral oil hydrophilic petroleum, Hydroserpine and ketoconazole, improved.  Monitor further as an outpatient and if recurrent problems can reinitiate treatment  Discharge Instructions   Allergies as of 05/19/2019      Reactions   Milk-related Compounds Rash   Pt states she eats a lot of products with milk in it, but straight milk breaks her out into bumps.      Medication List    STOP taking these medications   amLODipine 10 MG tablet Commonly known as: NORVASC   BC HEADACHE POWDER PO   clobetasol ointment 0.05 % Commonly known as: TEMOVATE   hydrOXYzine 25 MG capsule Commonly known as: VISTARIL   nicotine polacrilex 2 MG lozenge Commonly known as: Nicotine Mini   triamcinolone cream 0.1 % Commonly known as: KENALOG     TAKE these medications   albuterol 108 (90 Base) MCG/ACT inhaler Commonly known as: VENTOLIN HFA Inhale 2 puffs into the lungs every 6 (six) hours as needed for wheezing.   folic acid 1 MG tablet Commonly known as: FOLVITE Take 1 tablet (1 mg total) by mouth daily.   loratadine 10 MG tablet Commonly known  as: CLARITIN Take 1 tablet (10 mg total) by mouth daily as needed for allergies.   multivitamin with minerals Tabs tablet Take 1 tablet by mouth daily.   nicotine 14  mg/24hr patch Commonly known as: NICODERM CQ - dosed in mg/24 hours Place 1 patch (14 mg total) onto the skin daily.   polyethylene glycol 17 g packet Commonly known as: MIRALAX / GLYCOLAX Take 17 g by mouth 2 (two) times daily.   thiamine 100 MG tablet Take 1 tablet (100 mg total) by mouth daily.      Follow-up Information    Ladell Pier, MD .   Specialty: Internal Medicine Contact information: [place]  [id number] [phone]           Consultations:  Neurology   Psychiatry   Procedures/Studies:  No results found.   Subjective: - no chest pain, shortness of breath, no abdominal pain, nausea or vomiting.   Discharge Exam: BP 90/62 (BP Location: Left Arm)   Pulse 67   Temp 97.9 F (36.6 C)   Resp 16   Ht 5\' 7"  (1.702 m)   Wt 67.1 kg   SpO2 95%   BMI 23.18 kg/m   General: Pt is alert, awake, not in acute distress Cardiovascular: RRR, S1/S2 +, no rubs, no gallops Respiratory: CTA bilaterally, no wheezing, no rhonchi Abdominal: Soft, NT, ND, bowel sounds + Extremities: no edema, no cyanosis    The results of significant diagnostics from this hospitalization (including imaging, microbiology, ancillary and laboratory) are listed below for reference.     Microbiology: Recent Results (from the past 240 hour(s))  SARS CORONAVIRUS 2 (TAT 6-24 HRS) Nasopharyngeal Nasopharyngeal Swab     Status: None   Collection Time: 05/17/19 10:29 AM   Specimen: Nasopharyngeal Swab  Result Value Ref Range Status   SARS Coronavirus 2 NEGATIVE NEGATIVE Final    Comment: (NOTE) SARS-CoV-2 target nucleic acids are NOT DETECTED. The SARS-CoV-2 RNA is generally detectable in upper and lower respiratory specimens during the acute phase of infection. Negative results do not preclude SARS-CoV-2 infection, do not rule out co-infections with other pathogens, and should not be used as the sole basis for treatment or other patient management  decisions. Negative results must be combined with clinical observations, patient history, and epidemiological information. The expected result is Negative. Fact Sheet for Patients: SugarRoll.be Fact Sheet for Healthcare Providers: https://www.woods-mathews.com/ This test is not yet approved or cleared by the [place] FDA and  has been authorized for detection and/or diagnosis of SARS-CoV-2 by FDA under an Emergency Use Authorization (EUA). This EUA will remain  in effect (meaning this test can be used) for the duration of the COVID-19 declaration under Section 56 4(b)(1) of the Act, 21 U.S.C. section 360bbb-3(b)(1), unless the authorization is terminated or revoked sooner. Performed at [facility], [place] [address], [place],  [address]      Labs: Basic Metabolic Panel: No results for input(s): NA, K, CL, CO2, GLUCOSE, BUN, CREATININE, CALCIUM, MG, PHOS in the last 168 hours. Liver Function Tests: No results for input(s): AST, ALT, ALKPHOS, BILITOT, PROT, ALBUMIN in the last 168 hours. CBC: No results for input(s): WBC, NEUTROABS, HGB, HCT, MCV, PLT in the last 168 hours. CBG: No results for input(s): GLUCAP in the last 168 hours. Hgb A1c No results for input(s): HGBA1C in the last 72 hours. Lipid Profile No results for input(s): CHOL, HDL, LDLCALC, TRIG, CHOLHDL, LDLDIRECT in the last 72 hours. Thyroid function  studies No results for input(s): TSH, T4TOTAL, T3FREE, THYROIDAB in the last 72 hours.  Invalid input(s): FREET3 Urinalysis    Component Value Date/Time   COLORURINE AMBER (A) 12/13/2018 2218   APPEARANCEUR HAZY (A) 12/13/2018 2218   LABSPEC 1.027 12/13/2018 2218   PHURINE 5.0 12/13/2018 2218   GLUCOSEU NEGATIVE 12/13/2018 2218   HGBUR SMALL (A) 12/13/2018 2218   HGBUR small 06/28/2009 1504   BILIRUBINUR SMALL (A) 12/13/2018 2218   KETONESUR 5 (A) 12/13/2018 2218   PROTEINUR 30 (A) 12/13/2018 2218    UROBILINOGEN 1.0 09/28/2014 1426   NITRITE NEGATIVE 12/13/2018 2218   LEUKOCYTESUR TRACE (A) 12/13/2018 2218    FURTHER DISCHARGE INSTRUCTIONS:   Get Medicines reviewed and adjusted: Please take all your medications with you for your next visit with your Primary MD   Laboratory/radiological data: Please request your Primary MD to go over all hospital tests and procedure/radiological results at the follow up, please ask your Primary MD to get all Hospital records sent to his/her office.   In some cases, they will be blood work, cultures and biopsy results pending at the time of your discharge. Please request that your primary care M.D. goes through all the records of your hospital data and follows up on these results.   Also Note the following: If you experience worsening of your admission symptoms, develop shortness of breath, life threatening emergency, suicidal or homicidal thoughts you must seek medical attention immediately by calling 911 or calling your MD immediately  if symptoms less severe.   You must read complete instructions/literature along with all the possible adverse reactions/side effects for all the Medicines you take and that have been prescribed to you. Take any new Medicines after you have completely understood and accpet all the possible adverse reactions/side effects.    Do not drive when taking Pain medications or sleeping medications (Benzodaizepines)   Do not take more than prescribed Pain, Sleep and Anxiety Medications. It is not advisable to combine anxiety,sleep and pain medications without talking with your primary care practitioner   Special Instructions: If you have smoked or chewed Tobacco  in the last 2 yrs please stop smoking, stop any regular Alcohol  and or any Recreational drug use.   Wear Seat belts while driving.   Please note: You were cared for by a hospitalist during your hospital stay. Once you are discharged, your primary care physician will  handle any further medical issues. Please note that NO REFILLS for any discharge medications will be authorized once you are discharged, as it is imperative that you return to your primary care physician (or establish a relationship with a primary care physician if you do not have one) for your post hospital discharge needs so that they can reassess your need for medications and monitor your lab values.  Time coordinating discharge: 40 minutes  SIGNED:  Pamella Pert, MD, PhD 05/19/2019, 12:03 PM

## 2019-07-10 ENCOUNTER — Ambulatory Visit: Payer: Self-pay | Admitting: Physician Assistant

## 2019-07-10 ENCOUNTER — Other Ambulatory Visit: Payer: Self-pay

## 2019-07-10 VITALS — BP 105/73 | HR 76 | Resp 16 | Ht 67.5 in

## 2019-07-10 DIAGNOSIS — R410 Disorientation, unspecified: Secondary | ICD-10-CM

## 2019-07-10 DIAGNOSIS — K5900 Constipation, unspecified: Secondary | ICD-10-CM

## 2019-07-10 DIAGNOSIS — E512 Wernicke's encephalopathy: Secondary | ICD-10-CM

## 2019-07-10 DIAGNOSIS — F17200 Nicotine dependence, unspecified, uncomplicated: Secondary | ICD-10-CM

## 2019-07-10 MED ORDER — POLYETHYLENE GLYCOL 3350 17 G PO PACK: 17.0000 g | PACK | Freq: Two times a day (BID) | ORAL | 0 refills | Status: DC

## 2019-07-10 MED ORDER — THIAMINE HCL 100 MG PO TABS: 100.0000 mg | ORAL_TABLET | Freq: Every day | ORAL | 0 refills | Status: DC

## 2019-07-10 MED ORDER — FOLIC ACID 1 MG PO TABS: 1.0000 mg | ORAL_TABLET | Freq: Every day | ORAL | 0 refills | Status: DC

## 2019-07-10 MED ORDER — NICOTINE 14 MG/24HR TD PT24: 14.0000 mg | MEDICATED_PATCH | Freq: Every day | TRANSDERMAL | 0 refills | Status: AC

## 2019-07-10 MED ORDER — ADULT MULTIVITAMIN W/MINERALS CH: 1.0000 | ORAL_TABLET | Freq: Every day | ORAL | 0 refills | Status: DC

## 2019-07-10 NOTE — Progress Notes (Signed)
Med techs    Established Patient Office Visit  Subjective:  [name], female    DOB: [birth year]  Age: 64 y.o. MRN: [id number]  CC:  Chief Complaint  Patient presents with  . Medication Refill  . Hospitalization Follow-up    HPI Marie Woods presents with med tech from assisted living center.  Reports that she is doing well since her recent hospitalization.  Care plan for assisted living facility is presented and request authorization.  Hospital Course / Discharge diagnoses: Severe sepsis 2/2 UTI: Present on Admission UTI -Urine culture showed multiple species. Repeat culture showed less than 10,000 colonies. Was initially treated with IV Rocephin and fluids, sepsis physiology has resolved and she has hemodynamically stable  Acute Metabolic Encephalopathy Possible Wernicke's Encephalopathy CT of the head showed mild atrophic changes without acute intracranial abnormality. MRI of the brain: Symmetric diffusion weighted and T2/FLAIR hyperintense signal abnormality within the medial thalami. Findings favor sequelae of Wernicke's encephalopathy.  Completed treatment with IV thiamine and now currently on p.o. There was question of patient having alcohol abuse however alcohol level less than 10. Urine drug screen negative. Outpatient follow-up with Neurology and consider Neuropsychiatry Evaluation Essential hypertension -Amlodipine 5 mg po Daily discontinued due to soft BP. BP overall stable History of Alcohol Abuse -Treated with CIWA protocol. Continue folate, thiamine and multivitamin.  History of Tobacco Abuse -Continue nicotine patch 14 mg Transdermal patch every 24 hours Normocytic Anemia -labs stable Chest tightness/shortness of breath and dizziness -Resolved, likely transient  Constipation -bowel regimen Xerosis/Eczema -she had Aveeno soothing bath treatments, mineral oil hydrophilic petroleum, Hydroserpine and ketoconazole, improved.  Monitor further as an  outpatient and if recurrent problems can reinitiate treatment  Since being discharged, blood pressure within normal limits, despite discontinuing amlodipine.  Constipation resolved with daily MiraLAX.  No new concerns at this time  Past Medical History:  Diagnosis Date  . Asthma   . Eczema   . Hypertension     No past surgical history on file.  Family History  Problem Relation Age of Onset  . Cancer Mother   . Heart disease Mother   . Heart disease Father     Social History   Socioeconomic History  . Marital status: Single    Spouse name: Not on file  . Number of children: Not on file  . Years of education: Not on file  . Highest education level: Not on file  Occupational History  . Not on file  Tobacco Use  . Smoking status: Current Every Day Smoker    Packs/day: 0.25    Types: Cigarettes  . Smokeless tobacco: Current User  Substance and Sexual Activity  . Alcohol use: Yes    Comment: 1 beer daily after work  . Drug use: No  . Sexual activity: Not on file  Other Topics Concern  . Not on file  Social History Narrative  . Not on file   Social Determinants of Health   Financial Resource Strain:   . Difficulty of Paying Living Expenses:   Food Insecurity:   . Worried About Programme researcher, broadcasting/film/video in the Last Year:   . Barista in the Last Year:   Transportation Needs:   . Freight forwarder (Medical):   [name] Lack of Transportation (Non-Medical):   Physical Activity:   . Days of Exercise per Week:   . Minutes of Exercise per Session:   Stress:   . Feeling of Stress :  Social Connections:   . Frequency of Communication with Friends and Family:   . Frequency of Social Gatherings with Friends and Family:   . Attends Religious Services:   . Active Member of Clubs or Organizations:   . Attends Archivist Meetings:   [name] Marital Status:   Intimate Partner Violence:   . Fear of Current or Ex-Partner:   . Emotionally Abused:   [name] Physically  Abused:   . Sexually Abused:     Outpatient Medications Prior to Visit  Medication Sig Dispense Refill  . albuterol (VENTOLIN HFA) 108 (90 Base) MCG/ACT inhaler Inhale 2 puffs into the lungs every 6 (six) hours as needed for wheezing. 18 g 1  . loratadine (CLARITIN) 10 MG tablet Take 1 tablet (10 mg total) by mouth daily as needed for allergies. 30 tablet 3  . folic acid (FOLVITE) 1 MG tablet Take 1 tablet (1 mg total) by mouth daily. 30 tablet 0  . Multiple Vitamin (MULTIVITAMIN WITH MINERALS) TABS tablet Take 1 tablet by mouth daily. 30 tablet 0  . nicotine (NICODERM CQ - DOSED IN MG/24 HOURS) 14 mg/24hr patch Place 1 patch (14 mg total) onto the skin daily. 28 patch 0  . polyethylene glycol (MIRALAX / GLYCOLAX) 17 g packet Take 17 g by mouth 2 (two) times daily. 30 each 0  . thiamine 100 MG tablet Take 1 tablet (100 mg total) by mouth daily. 30 tablet 0   No facility-administered medications prior to visit.    Allergies  Allergen Reactions  . Milk-Related Compounds Rash    Pt states she eats a lot of products with milk in it, but straight milk breaks her out into bumps.    ROS Review of Systems  Constitutional: Negative.   HENT: Negative.   Eyes: Negative.   Respiratory: Negative.   Cardiovascular: Negative.   Gastrointestinal: Negative.   Endocrine: Negative.   Genitourinary: Negative.   Musculoskeletal: Negative.   Skin: Negative.   Allergic/Immunologic: Negative.   Neurological: Negative.   Hematological: Negative.   Psychiatric/Behavioral: Negative.       Objective:    Physical Exam  Constitutional: She is oriented to person, place, and time. She appears well-developed and well-nourished. No distress.  HENT:  Head: Normocephalic and atraumatic.  Right Ear: External ear normal.  Left Ear: External ear normal.  Nose: Nose normal.  Mouth/Throat: Oropharynx is clear and moist.  Eyes: Pupils are equal, round, and reactive to light. Conjunctivae and EOM are  normal.  Cardiovascular: Normal rate, regular rhythm and normal heart sounds.  Pulmonary/Chest: Effort normal and breath sounds normal.  Abdominal: Soft. Bowel sounds are normal.  Musculoskeletal:        General: Normal range of motion.     Cervical back: Normal range of motion and neck supple.  Neurological: She is alert and oriented to person, place, and time. She has normal reflexes.  Skin: Skin is warm and dry. She is not diaphoretic.  Psychiatric: She has a normal mood and affect. Her behavior is normal. Judgment and thought content normal.  Nursing note and vitals reviewed.   BP 105/73   Pulse 76   Resp 16   Ht 5' 7.5" (1.715 m)   SpO2 95%   BMI 22.84 kg/m  Wt Readings from Last 3 Encounters:  02/22/19 148 lb (67.1 kg)  12/02/18 230 lb 2.6 oz (104.4 kg)  10/11/18 230 lb 3.2 oz (104.4 kg)     Health Maintenance Due  Topic Date Due  .  COVID-19 Vaccine (1) Never done  . COLONOSCOPY  Never done  . PAP SMEAR-Modifier  03/27/2018  . MAMMOGRAM  07/09/2018  . TETANUS/TDAP  01/11/2019    There are no preventive care reminders to display for this patient.  Lab Results  Component Value Date   TSH 1.97 11/14/2015   Lab Results  Component Value Date   WBC 9.0 05/04/2019   HGB 13.7 05/04/2019   HCT 41.6 05/04/2019   MCV 86.3 05/04/2019   PLT 324 05/04/2019   Lab Results  Component Value Date   NA 142 05/04/2019   K 4.1 05/04/2019   CO2 25 05/04/2019   GLUCOSE 101 (H) 05/04/2019   BUN 15 05/04/2019   CREATININE 0.81 05/04/2019   BILITOT 0.7 04/20/2019   ALKPHOS 40 04/20/2019   AST 15 04/20/2019   ALT 14 04/20/2019   PROT 6.1 (L) 04/20/2019   ALBUMIN 3.0 (L) 04/20/2019   CALCIUM 9.8 05/04/2019   ANIONGAP 12 05/04/2019   Lab Results  Component Value Date   CHOL 171 11/14/2015   Lab Results  Component Value Date   HDL 67 11/14/2015   Lab Results  Component Value Date   LDLCALC 88 11/14/2015   Lab Results  Component Value Date   TRIG 79 11/14/2015    Lab Results  Component Value Date   CHOLHDL 2.6 11/14/2015   Lab Results  Component Value Date   HGBA1C 5.0 06/17/2016      Assessment & Plan:   Problem List Items Addressed This Visit      Nervous and Auditory   Confusion   Wernicke encephalopathy - Primary   Relevant Medications   folic acid (FOLVITE) 1 MG tablet   Multiple Vitamin (MULTIVITAMIN WITH MINERALS) TABS tablet   thiamine 100 MG tablet     Other   TOBACCO ABUSE   Relevant Medications   nicotine (NICODERM CQ - DOSED IN MG/24 HOURS) 14 mg/24hr patch    Other Visit Diagnoses    Constipation, unspecified constipation type       Relevant Medications   polyethylene glycol (MIRALAX / GLYCOLAX) 17 g packet      Meds ordered this encounter  Medications  . folic acid (FOLVITE) 1 MG tablet    Sig: Take 1 tablet (1 mg total) by mouth daily.    Dispense:  30 tablet    Refill:  0    Order Specific Question:   Supervising Provider    Answer:   Delford Field, PATRICK E [[id number]]  . Multiple Vitamin (MULTIVITAMIN WITH MINERALS) TABS tablet    Sig: Take 1 tablet by mouth daily.    Dispense:  30 tablet    Refill:  0    Order Specific Question:   Supervising Provider    Answer:   Delford Field, PATRICK E [[id number]]  . thiamine 100 MG tablet    Sig: Take 1 tablet (100 mg total) by mouth daily.    Dispense:  30 tablet    Refill:  0    Order Specific Question:   Supervising Provider    Answer:   Delford Field, PATRICK E [[id number]]  . polyethylene glycol (MIRALAX / GLYCOLAX) 17 g packet    Sig: Take 17 g by mouth 2 (two) times daily.    Dispense:  30 each    Refill:  0    Order Specific Question:   Supervising Provider    Answer:   Delford Field, PATRICK E [[id number]]  . nicotine (NICODERM CQ - DOSED IN MG/24 HOURS) 14 mg/24hr  patch    Sig: Place 1 patch (14 mg total) onto the skin daily.    Dispense:  28 patch    Refill:  0    Order Specific Question:   Supervising Provider    Answer:   WRIGHT, PATRICK E [[id number]]  1. Wernicke encephalopathy Patient is  currently stable in an assisted living facility, although Dr. Laural Benes is listed as her primary care provider, patient has never establish care with her.  It is more convenient for the assisted living facility for patient to establish care at [facility], appointment was made for August 28, 2019 with Dr. Earlene Plater.  Medication order as well as care plan was signed for patient, this was reviewed against recent hospital admission.  Continue current regimen. - folic acid (FOLVITE) 1 MG tablet; Take 1 tablet (1 mg total) by mouth daily.  Dispense: 30 tablet; Refill: 0 - Multiple Vitamin (MULTIVITAMIN WITH MINERALS) TABS tablet; Take 1 tablet by mouth daily.  Dispense: 30 tablet; Refill: 0 - thiamine 100 MG tablet; Take 1 tablet (100 mg total) by mouth daily.  Dispense: 30 tablet; Refill: 0  2. Confusion   3. TOBACCO ABUSE  - nicotine (NICODERM CQ - DOSED IN MG/24 HOURS) 14 mg/24hr patch; Place 1 patch (14 mg total) onto the skin daily.  Dispense: 28 patch; Refill: 0  4. Constipation, unspecified constipation type  - polyethylene glycol (MIRALAX / GLYCOLAX) 17 g packet; Take 17 g by mouth 2 (two) times daily.  Dispense: 30 each; Refill: 0   I have reviewed the patient's medical history (PMH, PSH, Social History, Family History, Medications, and allergies) , and have been updated if relevant. I spent 20 minutes reviewing chart and  face to face time with patient.    Follow-up: Return in about 7 weeks (around 08/28/2019) for To establish PCP, with Dr. Earlene Plater at [facility].    Kasandra Knudsen Mayers, PA-C

## 2019-07-10 NOTE — Patient Instructions (Addendum)
Your appointment to establish primary care is on 08/28/19 at 10:10 with Dr. Juleen China  Please let us know if there is anything else we can help you with  Kennieth Rad, PA-C Physician Assistant [facility] [email or web address]    Coping with Quitting Smoking  Quitting smoking is a physical and mental challenge. You will face cravings, withdrawal symptoms, and temptation. Before quitting, work with your health care provider to make a plan that can help you cope. Preparation can help you quit and keep you from giving in. How can I cope with cravings? Cravings usually last for 5-10 minutes. If you get through it, the craving will pass. Consider taking the following actions to help you cope with cravings:  Keep your mouth busy: ? Chew sugar-free gum. ? Suck on hard candies or a straw. ? Brush your teeth.  Keep your hands and body busy: ? Immediately change to a different activity when you feel a craving. ? Squeeze or play with a ball. ? Do an activity or a hobby, like making bead jewelry, practicing needlepoint, or working with wood. ? Mix up your normal routine. ? Take a short exercise break. Go for a quick walk or run up and down stairs. ? Spend time in public places where smoking is not allowed.  Focus on doing something kind or helpful for someone else.  Call a friend or family member to talk during a craving.  Join a support group.  Call a quit line, such as 1-800-QUIT-NOW.  Talk with your health care provider about medicines that might help you cope with cravings and make quitting easier for you. How can I deal with withdrawal symptoms? Your body may experience negative effects as it tries to get used to not having nicotine in the system. These effects are called withdrawal symptoms. They may include:  Feeling hungrier than normal.  Trouble concentrating.  Irritability.  Trouble sleeping.  Feeling  depressed.  Restlessness and agitation.  Craving a cigarette. To manage withdrawal symptoms:  Avoid places, people, and activities that trigger your cravings.  Remember why you want to quit.  Get plenty of sleep.  Avoid coffee and other caffeinated drinks. These may worsen some of your symptoms. How can I handle social situations? Social situations can be difficult when you are quitting smoking, especially in the first few weeks. To manage this, you can:  Avoid parties, bars, and other social situations where people might be smoking.  Avoid alcohol.  Leave right away if you have the urge to smoke.  Explain to your family and friends that you are quitting smoking. Ask for understanding and support.  Plan activities with friends or family where smoking is not an option. What are some ways I can cope with stress? Wanting to smoke may cause stress, and stress can make you want to smoke. Find ways to manage your stress. Relaxation techniques can help. For example:  Breathe slowly and deeply, in through your nose and out through your mouth.  Listen to soothing, relaxing music.  Talk with a family member or friend about your stress.  Light a candle.  Soak in a bath or take a shower.  Think about a peaceful place. What are some ways I can prevent weight gain? Be aware that many people gain weight after they quit smoking. However, not everyone does. To keep from gaining weight, have a plan in place before you quit and stick to the plan after you quit. Your plan should include:  Having healthy snacks. When you have a craving, it may help to: ? Eat plain popcorn, crunchy carrots, celery, or other cut vegetables. ? Chew sugar-free gum.  Changing how you eat: ? Eat small portion sizes at meals. ? Eat 4-6 small meals throughout the day instead of 1-2 large meals a day. ? Be mindful when you eat. Do not watch television or do other things that might distract you as you  eat.  Exercising regularly: ? Make time to exercise each day. If you do not have time for a long workout, do short bouts of exercise for 5-10 minutes several times a day. ? Do some form of strengthening exercise, like weight lifting, and some form of aerobic exercise, like running or swimming.  Drinking plenty of water or other low-calorie or no-calorie drinks. Drink 6-8 glasses of water daily, or as much as instructed by your health care provider. Summary  Quitting smoking is a physical and mental challenge. You will face cravings, withdrawal symptoms, and temptation to smoke again. Preparation can help you as you go through these challenges.  You can cope with cravings by keeping your mouth busy (such as by chewing gum), keeping your body and hands busy, and making calls to family, friends, or a helpline for people who want to quit smoking.  You can cope with withdrawal symptoms by avoiding places where people smoke, avoiding drinks with caffeine, and getting plenty of rest.  Ask your health care provider about the different ways to prevent weight gain, avoid stress, and handle social situations. This information is not intended to replace advice given to you by your health care provider. Make sure you discuss any questions you have with your health care provider. Document Revised: 01/01/2017 Document Reviewed: 01/17/2016 Elsevier Patient Education  2020 [employer].

## 2019-08-28 ENCOUNTER — Telehealth: Payer: Self-pay | Admitting: Internal Medicine

## 2019-09-04 ENCOUNTER — Telehealth: Payer: Self-pay | Admitting: Internal Medicine

## 2019-09-08 ENCOUNTER — Telehealth: Payer: Self-pay | Admitting: Internal Medicine

## 2019-09-21 ENCOUNTER — Encounter: Payer: Self-pay | Admitting: Internal Medicine

## 2019-09-21 ENCOUNTER — Telehealth (INDEPENDENT_AMBULATORY_CARE_PROVIDER_SITE_OTHER): Payer: Self-pay | Admitting: Internal Medicine

## 2019-09-21 ENCOUNTER — Other Ambulatory Visit: Payer: Self-pay

## 2019-09-21 DIAGNOSIS — F17200 Nicotine dependence, unspecified, uncomplicated: Secondary | ICD-10-CM

## 2019-09-21 DIAGNOSIS — I10 Essential (primary) hypertension: Secondary | ICD-10-CM

## 2019-09-21 DIAGNOSIS — Z7689 Persons encountering health services in other specified circumstances: Secondary | ICD-10-CM

## 2019-09-21 NOTE — Progress Notes (Signed)
Virtual Visit via Telephone Note  I connected with Marie Woods, on 09/21/2019 at 2:10 PM by telephone due to the COVID-19 pandemic and verified that I am speaking with the correct person using two identifiers.   Consent: I discussed the limitations, risks, security and privacy concerns of performing an evaluation and management service by telephone and the availability of in person appointments. I also discussed with the patient that there may be a patient responsible charge related to this service. The patient expressed understanding and agreed to proceed.   Location of Patient: Home   Location of Provider: Clinic    Persons participating in Telemedicine visit: Chrysa Rampy [facility] Dr. Earlene Plater  Patient's Case Manager at ALF   History of Present Illness: Patient has a visit to establish care. She is currently at [facility]--has resided there since April. No concerns today. Needs to establish with PCP due to insurance. Patient reports PMH of HTN, asthma, dementia. Not currently on any medications for HTN.   Case manager reports that when she can get a cigarette, she will smoke. Is wearing a nicotine patch. Smoking about 3 cigarettes per day.   Does not monitor BP at ALF.    Past Medical History:  Diagnosis Date  . Asthma   . Eczema   . Hypertension    Allergies  Allergen Reactions  . Milk-Related Compounds Rash    Pt states she eats a lot of products with milk in it, but straight milk breaks her out into bumps.    Current Outpatient Medications on File Prior to Visit  Medication Sig Dispense Refill  . albuterol (VENTOLIN HFA) 108 (90 Base) MCG/ACT inhaler Inhale 2 puffs into the lungs every 6 (six) hours as needed for wheezing. 18 g 1  . folic acid (FOLVITE) 1 MG tablet Take 1 tablet (1 mg total) by mouth daily. 30 tablet 0  . loratadine (CLARITIN) 10 MG tablet Take 1 tablet (10 mg total) by mouth daily as needed for allergies. 30 tablet 3  .  Multiple Vitamin (MULTIVITAMIN WITH MINERALS) TABS tablet Take 1 tablet by mouth daily. 30 tablet 0  . nicotine (NICODERM CQ - DOSED IN MG/24 HOURS) 14 mg/24hr patch Place 1 patch (14 mg total) onto the skin daily. 28 patch 0  . polyethylene glycol (MIRALAX / GLYCOLAX) 17 g packet Take 17 g by mouth 2 (two) times daily. 30 each 0  . thiamine 100 MG tablet Take 1 tablet (100 mg total) by mouth daily. 30 tablet 0   No current facility-administered medications on file prior to visit.    Observations/Objective: NAD. Speaking clearly.  Work of breathing normal.  Alert and oriented. Mood appropriate.   Assessment and Plan: 1. Encounter to establish care Reviewed patient's PMH, social history, surgical history, and medications.  Is overdue for annual exam, screening blood work, and health maintenance topics. Have asked patient to return for visit to address these items.   2. TOBACCO ABUSE Encouraged to continue to limit cigarette use and to continue use of nicotine patch.   3. Essential hypertension Has history of HTN but is not currently on any medications. Reviewed that prior BP trend has been fairly stable. Will need to monitor at upcoming visit especially given use of nicotine patch.    Follow Up Instructions: Annual exam    I discussed the assessment and treatment plan with the patient. The patient was provided an opportunity to ask questions and all were answered. The patient  agreed with the plan and demonstrated an understanding of the instructions.   The patient was advised to call back or seek an in-person evaluation if the symptoms worsen or if the condition fails to improve as anticipated.   I provided 14 minutes total of non-face-to-face time during this encounter including median intraservice time, reviewing previous notes, investigations, ordering medications, medical decision making, coordinating care and patient verbalized understanding at the end of the  visit.    Marcy Siren, D.O. Primary Care at [facility]  09/21/2019, 2:10 PM

## 2019-11-09 NOTE — Patient Instructions (Signed)
Keeping You Healthy  Get These Tests  Blood Pressure- Have your blood pressure checked by your healthcare provider at least once a year.  Normal blood pressure is 120/80.  Weight- Have your body mass index (BMI) calculated to screen for obesity.  BMI is a measure of body fat based on height and weight.  You can calculate your own BMI at www.nhlbisupport.com/bmi/  Cholesterol- Have your cholesterol checked every year.  Diabetes- Have your blood sugar checked every year if you have high blood pressure, high cholesterol, a family history of diabetes or if you are overweight.  Pap Test - Have a pap test every 1 to 5 years if you have been sexually active.  If you are older than 62 and recent pap tests have been normal you may not need additional pap tests.  In addition, if you have had a hysterectomy  for benign disease additional pap tests are not necessary.  Mammogram-Yearly mammograms are essential for early detection of breast cancer  Screening for Colon Cancer- Colonoscopy starting at age 50. Screening may begin sooner depending on your family history and other health conditions.  Follow up colonoscopy as directed by your Gastroenterologist.  Screening for Osteoporosis- Screening begins at age 62 with bone density scanning, sooner if you are at higher risk for developing Osteoporosis.  Get these medicines  Calcium with Vitamin D- Your body requires 1200-1500 mg of Calcium a day and 800-1000 IU of Vitamin D a day.  You can only absorb 500 mg of Calcium at a time therefore Calcium must be taken in 2 or 3 separate doses throughout the day.  Hormones- Hormone therapy has been associated with increased risk for certain cancers and heart disease.  Talk to your healthcare provider about if you need relief from menopausal symptoms.  Aspirin- Ask your healthcare provider about taking Aspirin to prevent Heart Disease and Stroke.  Get these Immuniztions  Flu shot- Every fall  Pneumonia shot-  Once after the age of 62; if you are younger ask your healthcare provider if you need a pneumonia shot.  Tetanus- Every ten years.  Zostavax- Once after the age of 62 to prevent shingles.  Take these steps  Don't smoke- Your healthcare provider can help you quit. For tips on how to quit, ask your healthcare provider or go to www.smokefree.gov or call 1-800 QUIT-NOW.  Be physically active- Exercise 5 days a week for a minimum of 30 minutes.  If you are not already physically active, start slow and gradually work up to 30 minutes of moderate physical activity.  Try walking, dancing, bike riding, swimming, etc.  Eat a healthy diet- Eat a variety of healthy foods such as fruits, vegetables, whole grains, low fat milk, low fat cheeses, yogurt, lean meats, chicken, fish, eggs, dried beans, tofu, etc.  For more information go to www.thenutritionsource.org  Dental visit- Brush and floss teeth twice daily; visit your dentist twice a year.  Eye exam- Visit your Optometrist or Ophthalmologist yearly.  Drink alcohol in moderation- Limit alcohol intake to one drink or less a day.  Never drink and drive.  Depression- Your emotional health is as important as your physical health.  If you're feeling down or losing interest in things you normally enjoy, please talk to your healthcare provider.  Seat Belts- can save your life; always wear one  Smoke/Carbon Monoxide detectors- These detectors need to be installed on the appropriate level of your home.  Replace batteries at least once a year.  Violence- If   anyone is threatening or hurting you, please tell your healthcare provider.  Living Will/ Health care power of attorney- Discuss with your healthcare provider and family. 

## 2019-11-10 ENCOUNTER — Other Ambulatory Visit (HOSPITAL_COMMUNITY)
Admission: RE | Admit: 2019-11-10 | Discharge: 2019-11-10 | Disposition: A | Discharge status: Home / Self Care | Payer: Medicaid Other | Source: Ambulatory Visit | Attending: Internal Medicine | Admitting: Internal Medicine

## 2019-11-10 ENCOUNTER — Other Ambulatory Visit: Payer: Self-pay

## 2019-11-10 ENCOUNTER — Ambulatory Visit (INDEPENDENT_AMBULATORY_CARE_PROVIDER_SITE_OTHER): Payer: Self-pay | Admitting: Internal Medicine

## 2019-11-10 ENCOUNTER — Encounter: Payer: Self-pay | Admitting: Internal Medicine

## 2019-11-10 VITALS — BP 132/80 | HR 68 | Temp 97.5°F | Resp 17 | Ht 65.0 in | Wt 194.0 lb

## 2019-11-10 DIAGNOSIS — Z0000 Encounter for general adult medical examination without abnormal findings: Secondary | ICD-10-CM

## 2019-11-10 DIAGNOSIS — Z1211 Encounter for screening for malignant neoplasm of colon: Secondary | ICD-10-CM

## 2019-11-10 DIAGNOSIS — Z124 Encounter for screening for malignant neoplasm of cervix: Secondary | ICD-10-CM

## 2019-11-10 DIAGNOSIS — Z23 Encounter for immunization: Secondary | ICD-10-CM

## 2019-11-10 DIAGNOSIS — Z1231 Encounter for screening mammogram for malignant neoplasm of breast: Secondary | ICD-10-CM

## 2019-11-10 NOTE — Progress Notes (Signed)
Subjective:    Marie Woods - 64 y.o. female MRN [id number]  Date of birth: 12-10-1955  HPI  Marie Woods is here for annual exam. Patient presents with her health aide. Has no concerns today. Declines STD screening. Has received COVID vaccine.      Health Maintenance:  Health Maintenance Due  Topic Date Due   PAP SMEAR-Modifier  03/27/2018   MAMMOGRAM  07/09/2018   TETANUS/TDAP  01/11/2019   COLON CANCER SCREENING ANNUAL FOBT  04/04/2019   INFLUENZA VACCINE  09/03/2019    -  reports that she has been smoking cigarettes. She has been smoking about 0.25 packs per day. She has never used smokeless tobacco. - Review of Systems: Per HPI. - Past Medical History: Patient Active Problem List   Diagnosis Date Noted   Normocytic anemia    ASCUS with positive high risk HPV cervical 05/23/2015   Alopecia 06/13/2014   DEPRESSION 04/03/2010   TOBACCO ABUSE 06/28/2009   ALLERGIC RHINITIS 06/28/2009   History of alcohol use 01/10/2009   Essential hypertension 01/10/2009   Asthma 01/10/2009   Dermatitis, atopic 01/10/2009   - Medications: reviewed and updated   Objective:   Physical Exam BP 132/80    Pulse 68    Temp (!) 97.5 F (36.4 C) (Temporal)    Resp 17    Ht 5\' 5"  (1.651 m)    Wt 194 lb (88 kg)    SpO2 96%    BMI 32.28 kg/m  Physical Exam Constitutional:      Appearance: She is not diaphoretic.  HENT:     Head: Normocephalic and atraumatic.  Eyes:     Conjunctiva/sclera: Conjunctivae normal.     Pupils: Pupils are equal, round, and reactive to light.  Neck:     Thyroid: No thyromegaly.  Cardiovascular:     Rate and Rhythm: Normal rate and regular rhythm.     Heart sounds: Normal heart sounds. No murmur heard.   Pulmonary:     Effort: Pulmonary effort is normal. No respiratory distress.     Breath sounds: Normal breath sounds. No wheezing.  Abdominal:     General: Bowel sounds are normal. There is no distension.     Palpations: Abdomen is soft.      Tenderness: There is no abdominal tenderness. There is no guarding or rebound.  Genitourinary:    Comments: GU/GYN: Exam performed in the presence of a chaperone. External genitalia within normal limits.  Vaginal mucosa pink, moist, normal rugae.  Nonfriable cervix without lesions, no discharge or bleeding noted on speculum exam.  Bimanual exam revealed normal, nongravid uterus.  No cervical motion tenderness. No adnexal masses bilaterally.   Musculoskeletal:        General: No deformity. Normal range of motion.     Cervical back: Normal range of motion and neck supple.  Lymphadenopathy:     Cervical: No cervical adenopathy.  Skin:    General: Skin is warm and dry.     Findings: No rash.  Neurological:     Mental Status: She is alert and oriented to person, place, and time.     Gait: Gait is intact.  Psychiatric:        Mood and Affect: Mood and affect normal.        Judgment: Judgment normal.            Assessment & Plan:   1. Annual physical exam Counseled on 150 minutes of exercise per week, healthy eating (including decreased  daily intake of saturated fats, cholesterol, added sugars, sodium), STI prevention, routine healthcare maintenance. - CBC with Differential - Comprehensive metabolic panel - Lipid Panel  2. Pap smear for cervical cancer screening - Cytology - PAP([facility])  3. Breast cancer screening by mammogram - MS DIGITAL SCREENING BILATERAL; Future  4. Colon cancer screening - Fecal occult blood, imunochemical([facility]/Sun[facility]); Future  5. Needs flu shot - Flu Vaccine QUAD 6+ mos PF IM (Fluarix Quad PF)      Marcy Siren, D.O. 11/10/2019, 8:54 AM Primary Care at [facility]

## 2019-11-11 LAB — COMPREHENSIVE METABOLIC PANEL
ALT: 9 IU/L (ref 0–32)
AST: 15 IU/L (ref 0–40)
Albumin/Globulin Ratio: 1.2 (ref 1.2–2.2)
Albumin: 4.3 g/dL (ref 3.8–4.8)
Alkaline Phosphatase: 74 IU/L (ref 44–121)
BUN/Creatinine Ratio: 18 (ref 12–28)
BUN: 13 mg/dL (ref 8–27)
Bilirubin Total: 0.6 mg/dL (ref 0.0–1.2)
CO2: 26 mmol/L (ref 20–29)
Calcium: 10.2 mg/dL (ref 8.7–10.3)
Chloride: 101 mmol/L (ref 96–106)
Creatinine, Ser: 0.73 mg/dL (ref 0.57–1.00)
GFR calc Af Amer: 102 mL/min/{1.73_m2} (ref >59)
GFR calc non Af Amer: 89 mL/min/{1.73_m2} (ref >59)
Globulin, Total: 3.6 g/dL (ref 1.5–4.5)
Glucose: 74 mg/dL (ref 65–99)
Potassium: 3.8 mmol/L (ref 3.5–5.2)
Sodium: 140 mmol/L (ref 134–144)
Total Protein: 7.9 g/dL (ref 6.0–8.5)

## 2019-11-11 LAB — CBC WITH DIFFERENTIAL/PLATELET
Basophils Absolute: 0 10*3/uL (ref 0.0–0.2)
Basos: 0 %
EOS (ABSOLUTE): 0.2 10*3/uL (ref 0.0–0.4)
Eos: 3 %
Hematocrit: 41.5 % (ref 34.0–46.6)
Hemoglobin: 13.3 g/dL (ref 11.1–15.9)
Immature Grans (Abs): 0 10*3/uL (ref 0.0–0.1)
Immature Granulocytes: 0 %
Lymphocytes Absolute: 2.6 10*3/uL (ref 0.7–3.1)
Lymphs: 36 %
MCH: 28.1 pg (ref 26.6–33.0)
MCHC: 32 g/dL (ref 31.5–35.7)
MCV: 88 fL (ref 79–97)
Monocytes Absolute: 0.8 10*3/uL (ref 0.1–0.9)
Monocytes: 11 %
Neutrophils Absolute: 3.6 10*3/uL (ref 1.4–7.0)
Neutrophils: 50 %
Platelets: 324 10*3/uL (ref 150–450)
RBC: 4.74 x10E6/uL (ref 3.77–5.28)
RDW: 13.6 % (ref 11.7–15.4)
WBC: 7.2 10*3/uL (ref 3.4–10.8)

## 2019-11-11 LAB — LIPID PANEL
Chol/HDL Ratio: 2.8 ratio (ref 0.0–4.4)
Cholesterol, Total: 198 mg/dL (ref 100–199)
HDL: 72 mg/dL (ref >39)
LDL Chol Calc (NIH): 109 mg/dL — ABNORMAL HIGH (ref 0–99)
Triglycerides: 96 mg/dL (ref 0–149)
VLDL Cholesterol Cal: 17 mg/dL (ref 5–40)

## 2019-11-13 LAB — CYTOLOGY - PAP
Adequacy: ABSENT
Comment: NEGATIVE
Diagnosis: NEGATIVE
High risk HPV: NEGATIVE

## 2019-11-14 ENCOUNTER — Other Ambulatory Visit: Payer: Self-pay | Admitting: Internal Medicine

## 2019-11-14 DIAGNOSIS — E785 Hyperlipidemia, unspecified: Secondary | ICD-10-CM

## 2019-11-14 MED ORDER — ATORVASTATIN CALCIUM 40 MG PO TABS: 40.0000 mg | ORAL_TABLET | Freq: Every day | ORAL | 3 refills | Status: DC

## 2019-11-14 MED FILL — ATORVASTATIN CALCIUM 40 MG: 40 | 30 days supply | Qty: 30 | Fill #0

## 2019-12-12 ENCOUNTER — Telehealth: Payer: Self-pay

## 2019-12-12 DIAGNOSIS — E785 Hyperlipidemia, unspecified: Secondary | ICD-10-CM

## 2019-12-12 NOTE — Telephone Encounter (Signed)
Please fax atorvastatin (LIPITOR) 40 MG tablet [[id number]]  And baby Asprin to [phone]

## 2019-12-13 MED ORDER — ASPIRIN EC 81 MG PO TBEC: 81.0000 mg | DELAYED_RELEASE_TABLET | Freq: Every day | ORAL | 3 refills | Status: AC

## 2019-12-13 MED ORDER — ATORVASTATIN CALCIUM 40 MG PO TABS: 40.0000 mg | ORAL_TABLET | Freq: Every day | ORAL | 3 refills | Status: AC

## 2019-12-13 NOTE — Telephone Encounter (Signed)
Meds printed. Will fax to number below after provider signature.

## 2020-05-09 ENCOUNTER — Telehealth: Payer: Self-pay

## 2020-05-09 ENCOUNTER — Telehealth: Payer: Self-pay | Admitting: Internal Medicine

## 2020-05-09 NOTE — Telephone Encounter (Signed)
Att to contact pt to begin virtual visit, unk staff at [employer] answered the phone stated that pt was having breakfast, when asked if knew anything about telehealth appt, woman stated no and hung up. If callback pt will need to reschedule in-person office visit

## 2020-05-22 ENCOUNTER — Telehealth: Payer: Self-pay | Admitting: Internal Medicine

## 2020-05-22 NOTE — Telephone Encounter (Signed)
Pt would like paperwork from provider stating that she prescribes pt medications. Pt informed me that she would fax over the paperwork today. I informed pt that paperwork takes 7 to 10 business to complete. Please follow up.

## 2020-05-22 NOTE — Telephone Encounter (Signed)
Pt needs provider to fill out paperwork that states that provider prescribes her meds. I informed pt that

## 2020-06-13 ENCOUNTER — Other Ambulatory Visit: Payer: Self-pay

## 2020-06-13 ENCOUNTER — Ambulatory Visit
Admission: RE | Admit: 2020-06-13 | Discharge: 2020-06-13 | Disposition: A | Discharge status: Home / Self Care | Payer: Self-pay | Source: Ambulatory Visit | Attending: Internal Medicine | Admitting: Internal Medicine

## 2020-06-13 DIAGNOSIS — Z1231 Encounter for screening mammogram for malignant neoplasm of breast: Secondary | ICD-10-CM

## 2020-11-27 ENCOUNTER — Encounter: Payer: Self-pay | Admitting: Nurse Practitioner

## 2020-11-27 ENCOUNTER — Ambulatory Visit (INDEPENDENT_AMBULATORY_CARE_PROVIDER_SITE_OTHER): Payer: Self-pay | Admitting: Nurse Practitioner

## 2020-11-27 ENCOUNTER — Other Ambulatory Visit: Payer: Self-pay

## 2020-11-27 ENCOUNTER — Ambulatory Visit: Payer: Self-pay | Admitting: Family Medicine

## 2020-11-27 VITALS — BP 121/73 | HR 83 | Resp 18

## 2020-11-27 DIAGNOSIS — L03119 Cellulitis of unspecified part of limb: Secondary | ICD-10-CM

## 2020-11-27 MED ORDER — CEPHALEXIN 500 MG PO CAPS: 500.0000 mg | ORAL_CAPSULE | Freq: Three times a day (TID) | ORAL | 0 refills | Status: AC

## 2020-11-27 NOTE — Patient Instructions (Signed)
Bilateral foot ulcer:  Will order keflex  Keep feet elevated when possible  Soak feet in dial antibacterial soap daily, rinse well, pat completely dry  Will place referral to podiatry  Follow up:  Follow up in 1-2 weeks with Dr. Andrey Campanile or Amy Zonia Kief

## 2020-11-27 NOTE — Progress Notes (Signed)
@Patient  ID: , female    DOB: 02/16/1955, 65 y.o.   MRN: [id number]  Chief Complaint  Patient presents with   Foot Swelling     Referring provider: [id number]*  HPI  Patient presents today for bilateral foot ulcers.  Patient states that has been ongoing problem for the last few weeks.  She states that her feet and lower extremities have been swelling.  The top of her feet are red, tender, warm to the touch with open blisters bilaterally.  Patient lives in assisted living.  We will order antibiotics and refer patient to podiatry.  This was discussed with nurse over phone call with assisted living.  Assisted living states that they have ordered home health wound care for the patient.  They are waiting a callback from the home health agency to set up an appointment time.  We will follow-up with the patient in 1 week. Denies f/c/s, n/v/d, hemoptysis, PND, chest pain.    Allergies  Allergen Reactions   Milk-Related Compounds Rash    Pt states she eats a lot of products with milk in it, but straight milk breaks her out into bumps.    Immunization History  Administered Date(s) Administered   Influenza Whole 11/29/2008, 03/08/2018   Influenza,inj,Quad PF,6+ Mos 03/21/2015, 11/14/2015, 10/11/2018, 11/10/2019   Influenza-Unspecified 11/05/2016   PFIZER(Purple Top)SARS-COV-2 Vaccination 07/10/2019, 08/08/2019   PPD Test 04/10/2019   Td 01/10/2009    Past Medical History:  Diagnosis Date   Asthma    Eczema    Hypertension     Tobacco History: Social History   Tobacco Use  Smoking Status Light Smoker   Packs/day: 0.25   Types: Cigarettes  Smokeless Tobacco Never  Tobacco Comments   3 cigs/day   Ready to quit: Not Answered Counseling given: Yes Tobacco comments: 3 cigs/day   Outpatient Encounter Medications as of 11/27/2020  Medication Sig   cephALEXin (KEFLEX) 500 MG capsule Take 1 capsule (500 mg total) by mouth 3 (three) times daily for 10  days.   Albuterol Sulfate 2.5 MG/0.5ML NEBU Inhale 1 each into the lungs every 4 (four) hours as needed.   aspirin EC 81 MG tablet Take 1 tablet (81 mg total) by mouth daily. Swallow whole.   atorvastatin (LIPITOR) 40 MG tablet Take 1 tablet (40 mg total) by mouth daily.   diphenhydrAMINE (BENADRYL) 25 MG tablet Take 25 mg by mouth every 6 (six) hours as needed.   nicotine (NICODERM CQ - DOSED IN MG/24 HOURS) 14 mg/24hr patch Place 1 patch (14 mg total) onto the skin daily.   No facility-administered encounter medications on file as of 11/27/2020.     Review of Systems  Review of Systems  Constitutional: Negative.   HENT: Negative.    Cardiovascular:  Positive for leg swelling.  Gastrointestinal: Negative.   Skin:        Bilateral feet swelling with redness, tenderness, and open blisters to top of feet.  Allergic/Immunologic: Negative.   Neurological: Negative.   Psychiatric/Behavioral: Negative.        Physical Exam  BP 121/73   Pulse 83   Resp 18   SpO2 97%   Wt Readings from Last 5 Encounters:  11/10/19 194 lb (88 kg)  02/22/19 148 lb (67.1 kg)  12/02/18 230 lb 2.6 oz (104.4 kg)  10/11/18 230 lb 3.2 oz (104.4 kg)  03/08/18 232 lb (105.2 kg)     Physical Exam Vitals and nursing note reviewed.  Constitutional:  General: She is not in acute distress.    Appearance: She is well-developed.  Cardiovascular:     Rate and Rhythm: Normal rate and regular rhythm.  Pulmonary:     Effort: Pulmonary effort is normal.     Breath sounds: Normal breath sounds.  Musculoskeletal:     Right lower leg: Edema present.     Left lower leg: Edema present.       Feet:  Feet:     Right foot:     Skin integrity: Blister, skin breakdown, erythema, warmth and dry skin present.     Left foot:     Skin integrity: Blister, skin breakdown, erythema, warmth and dry skin present.  Neurological:     Mental Status: She is alert and oriented to person, place, and time.        Assessment & Plan:   Cellulitis of lower extremity Bilateral foot ulcer:  Will order keflex  Keep feet elevated when possible  Soak feet in dial antibacterial soap daily, rinse well, pat completely dry  Will place referral to podiatry  Follow up:  Follow up in 1-2 weeks with Dr. Andrey Campanile or Amy Delight Stare, NP 11/27/2020

## 2020-11-27 NOTE — Assessment & Plan Note (Signed)
Bilateral foot ulcer:  Will order keflex  Keep feet elevated when possible  Soak feet in dial antibacterial soap daily, rinse well, pat completely dry  Will place referral to podiatry  Follow up:  Follow up in 1-2 weeks with Dr. Wilson or Amy Stephens 

## 2020-12-04 ENCOUNTER — Ambulatory Visit: Payer: Medicaid Other | Admitting: Podiatry

## 2020-12-05 ENCOUNTER — Ambulatory Visit: Payer: Self-pay | Admitting: Family Medicine

## 2020-12-11 ENCOUNTER — Ambulatory Visit: Payer: Medicaid Other | Admitting: Podiatry

## 2020-12-13 ENCOUNTER — Other Ambulatory Visit: Payer: Self-pay

## 2020-12-13 ENCOUNTER — Encounter (HOSPITAL_COMMUNITY): Payer: Self-pay | Admitting: Emergency Medicine

## 2020-12-13 ENCOUNTER — Emergency Department (HOSPITAL_COMMUNITY)
Admission: EM | Admit: 2020-12-13 | Discharge: 2020-12-14 | Disposition: A | Discharge status: Home / Self Care | Payer: Medicaid Other | Attending: Emergency Medicine | Admitting: Emergency Medicine

## 2020-12-13 DIAGNOSIS — F17210 Nicotine dependence, cigarettes, uncomplicated: Secondary | ICD-10-CM | POA: Insufficient documentation

## 2020-12-13 DIAGNOSIS — Z7982 Long term (current) use of aspirin: Secondary | ICD-10-CM | POA: Insufficient documentation

## 2020-12-13 DIAGNOSIS — Z20822 Contact with and (suspected) exposure to covid-19: Secondary | ICD-10-CM | POA: Diagnosis not present

## 2020-12-13 DIAGNOSIS — R630 Anorexia: Secondary | ICD-10-CM | POA: Diagnosis not present

## 2020-12-13 DIAGNOSIS — J45909 Unspecified asthma, uncomplicated: Secondary | ICD-10-CM | POA: Insufficient documentation

## 2020-12-13 DIAGNOSIS — Z79899 Other long term (current) drug therapy: Secondary | ICD-10-CM | POA: Insufficient documentation

## 2020-12-13 DIAGNOSIS — I10 Essential (primary) hypertension: Secondary | ICD-10-CM | POA: Diagnosis not present

## 2020-12-13 DIAGNOSIS — R059 Cough, unspecified: Secondary | ICD-10-CM | POA: Insufficient documentation

## 2020-12-13 DIAGNOSIS — R197 Diarrhea, unspecified: Secondary | ICD-10-CM | POA: Diagnosis not present

## 2020-12-13 LAB — CBC WITH DIFFERENTIAL/PLATELET
Abs Immature Granulocytes: 0.01 10*3/uL (ref 0.00–0.07)
Basophils Absolute: 0 10*3/uL (ref 0.0–0.1)
Basophils Relative: 1 %
Eosinophils Absolute: 0.2 10*3/uL (ref 0.0–0.5)
Eosinophils Relative: 4 %
HCT: 45.5 % (ref 36.0–46.0)
Hemoglobin: 14.9 g/dL (ref 12.0–15.0)
Immature Granulocytes: 0 %
Lymphocytes Relative: 48 %
Lymphs Abs: 2.9 10*3/uL (ref 0.7–4.0)
MCH: 27.7 pg (ref 26.0–34.0)
MCHC: 32.7 g/dL (ref 30.0–36.0)
MCV: 84.7 fL (ref 80.0–100.0)
Monocytes Absolute: 0.6 10*3/uL (ref 0.1–1.0)
Monocytes Relative: 11 %
Neutro Abs: 2.1 10*3/uL (ref 1.7–7.7)
Neutrophils Relative %: 36 %
Platelets: 275 10*3/uL (ref 150–400)
RBC: 5.37 MIL/uL — ABNORMAL HIGH (ref 3.87–5.11)
RDW: 14 % (ref 11.5–15.5)
WBC: 6 10*3/uL (ref 4.0–10.5)
nRBC: 0 % (ref 0.0–0.2)

## 2020-12-13 LAB — LIPASE, BLOOD: Lipase: 27 U/L (ref 11–51)

## 2020-12-13 LAB — COMPREHENSIVE METABOLIC PANEL
ALT: 37 U/L (ref 0–44)
AST: 63 U/L — ABNORMAL HIGH (ref 15–41)
Albumin: 3.5 g/dL (ref 3.5–5.0)
Alkaline Phosphatase: 54 U/L (ref 38–126)
Anion gap: 9 (ref 5–15)
BUN: 7 mg/dL — ABNORMAL LOW (ref 8–23)
CO2: 25 mmol/L (ref 22–32)
Calcium: 9.7 mg/dL (ref 8.9–10.3)
Chloride: 105 mmol/L (ref 98–111)
Creatinine, Ser: 0.66 mg/dL (ref 0.44–1.00)
GFR, Estimated: >60 mL/min (ref >60)
Glucose, Bld: 93 mg/dL (ref 70–99)
Potassium: 3.5 mmol/L (ref 3.5–5.1)
Sodium: 139 mmol/L (ref 135–145)
Total Bilirubin: 1.1 mg/dL (ref 0.3–1.2)
Total Protein: 8.1 g/dL (ref 6.5–8.1)

## 2020-12-13 NOTE — ED Provider Notes (Signed)
Emergency Medicine Provider Triage Evaluation Note  Marie Woods , a 65 y.o. female  was evaluated in triage.  Pt complains of loss of appetite and watery diarrhea for the past 3 days.  Patient apparently just finished a 10-day course of antibiotic for foot infection.  Per chart review she was prescribed Keflex for cellulitis.  She reports that the feet appear better however she has not felt like eating for the past 3 days.  She is from [facility].  She denies any abdominal pain, vomiting, fevers.  Review of Systems  Positive: + nausea, decreased appetite, diarrhea Negative: - fever, abd pain  Physical Exam  BP (!) 145/91 (BP Location: Left Arm)   Pulse 75   Temp 97.9 F (36.6 C) (Oral)   Resp 18   SpO2 100%  Gen:   Awake, no distress   Resp:  Normal effort  MSK:   Moves extremities without difficulty  Other:  Abd soft, nontender  Medical Decision Making  Medically screening exam initiated at 3:28 PM.  Appropriate orders placed.  DEIDRA SPEASE was informed that the remainder of the evaluation will be completed by another provider, this initial triage assessment does not replace that evaluation, and the importance of remaining in the ED until their evaluation is complete.     Tanda Rockers, PA-C 12/13/20 1530    Arby Barrette, MD 12/14/20 [phone]

## 2020-12-13 NOTE — ED Triage Notes (Signed)
Pt BIB [facility] from [facility], staff reports pt has had a loss of appetite x 3 days. Pt c/o diarrhea, just finished a 10 day course of antibiotics for a foot infection. Denies chest pain, cough, shortness of breath, fever.

## 2020-12-14 LAB — URINALYSIS, ROUTINE W REFLEX MICROSCOPIC
Bacteria, UA: NONE SEEN
Bilirubin Urine: NEGATIVE
Glucose, UA: NEGATIVE mg/dL
Hgb urine dipstick: NEGATIVE
Ketones, ur: 20 mg/dL — AB
Nitrite: NEGATIVE
Protein, ur: NEGATIVE mg/dL
Specific Gravity, Urine: 1.025 (ref 1.005–1.030)
pH: 5 (ref 5.0–8.0)

## 2020-12-14 LAB — RESP PANEL BY RT-PCR (FLU A&B, COVID) ARPGX2
Influenza A by PCR: NEGATIVE
Influenza B by PCR: NEGATIVE
SARS Coronavirus 2 by RT PCR: NEGATIVE

## 2020-12-14 MED ORDER — SODIUM CHLORIDE 0.9 % IV BOLUS
1000.0000 mL | Freq: Once | INTRAVENOUS | Status: AC
  Administered 2020-12-14: 1000 mL via INTRAVENOUS

## 2020-12-14 NOTE — ED Notes (Signed)
Pt given crackers and gingerale, tolerated PO without difficulty.

## 2020-12-14 NOTE — ED Notes (Signed)
[facility] called for transport.  

## 2020-12-14 NOTE — ED Notes (Signed)
Pt discharged and transported back to [facility] by [facility].

## 2020-12-14 NOTE — ED Provider Notes (Signed)
[facility] EMERGENCY DEPARTMENT Provider Note   CSN: [id number] Arrival date & time: 12/13/20  1517     History Chief Complaint  Patient presents with   Anorexia    Marie Woods is a 65 y.o. female with history of hypertension, hyperlipidemia, asthma, eczema.  Presents emergency department with a chief complaint of decreased appetite and diarrhea.  Patient reports that she has had diarrhea over the last week.  Patient states diarrhea is "almost like water."  Patient describes bowel movements as light brown in color.  Patient states she is having 2 bowel movements daily.  Patient reports decreased appetite over the last 4 days.  Additionally patient endorses dry cough over the last days.  Denies any blood in stool, melena, pain with defecation, abdominal pain, nausea, vomiting, fevers, chills, unexpected weight loss, night sweats, patient denies family history of cancer.    Per chart review patient just finished 10-day course of Keflex for cellulitis infection to bilateral feet on 11/5.  Patient denies any known sick contacts.      HPI     Past Medical History:  Diagnosis Date   Asthma    Eczema    Hypertension     Patient Active Problem List   Diagnosis Date Noted   Cellulitis of lower extremity 11/27/2020   Hyperlipidemia 11/14/2019   Normocytic anemia    ASCUS with positive high risk HPV cervical 05/23/2015   Alopecia 06/13/2014   DEPRESSION 04/03/2010   TOBACCO ABUSE 06/28/2009   ALLERGIC RHINITIS 06/28/2009   History of alcohol use 01/10/2009   Essential hypertension 01/10/2009   Asthma 01/10/2009   Dermatitis, atopic 01/10/2009    History reviewed. No pertinent surgical history.   OB History   No obstetric history on file.     Family History  Problem Relation Age of Onset   Cancer Mother    Heart disease Mother    Heart disease Father     Social History   Tobacco Use   Smoking status: Light Smoker    Packs/day: 0.25     Types: Cigarettes   Smokeless tobacco: Never   Tobacco comments:    3 cigs/day  Substance Use Topics   Alcohol use: Yes    Comment: 1 beer daily after work   Drug use: No    Home Medications Prior to Admission medications   Medication Sig Start Date End Date Taking? Authorizing Provider  Albuterol Sulfate 2.5 MG/0.5ML NEBU Inhale 1 each into the lungs every 4 (four) hours as needed.    [provider]  aspirin EC 81 MG tablet Take 1 tablet (81 mg total) by mouth daily. Swallow whole. 12/13/19   Nicolette Bang, MD  atorvastatin (LIPITOR) 40 MG tablet Take 1 tablet (40 mg total) by mouth daily. 12/13/19   Nicolette Bang, MD  diphenhydrAMINE (BENADRYL) 25 MG tablet Take 25 mg by mouth every 6 (six) hours as needed.    [provider]  nicotine (NICODERM CQ - DOSED IN MG/24 HOURS) 14 mg/24hr patch Place 1 patch (14 mg total) onto the skin daily. 07/10/19   Mayers, Cari S, PA-C    Allergies    Milk-related compounds  Review of Systems   Review of Systems  Constitutional:  Positive for appetite change. Negative for activity change, chills and fever.  Eyes:  Negative for visual disturbance.  Respiratory:  Positive for cough (nonproductive). Negative for shortness of breath.   Cardiovascular:  Negative for chest pain.  Gastrointestinal:  Positive for diarrhea. Negative for abdominal distention, abdominal pain, anal bleeding, blood in stool, constipation, nausea, rectal pain and vomiting.  Genitourinary:  Negative for difficulty urinating, dysuria, frequency, hematuria, urgency, vaginal bleeding, vaginal discharge and vaginal pain.  Musculoskeletal:  Negative for back pain and neck pain.  Skin:  Negative for color change and rash.  Neurological:  Negative for dizziness, syncope, light-headedness and headaches.  Psychiatric/Behavioral:  Negative for confusion.    Physical Exam Updated Vital Signs BP 132/87 (BP Location: Right Arm)   Pulse 77   Temp  98 F (36.7 C) (Oral)   Resp 16   SpO2 95%   Physical Exam Vitals and nursing note reviewed.  Constitutional:      General: She is not in acute distress.    Appearance: She is obese. She is not ill-appearing, toxic-appearing or diaphoretic.  HENT:     Head: Normocephalic.  Eyes:     General: No scleral icterus.       Right eye: No discharge.        Left eye: No discharge.  Cardiovascular:     Rate and Rhythm: Normal rate.     Pulses:          Dorsalis pedis pulses are detected w/ Doppler on the right side and detected w/ Doppler on the left side.       Posterior tibial pulses are detected w/ Doppler on the right side and detected w/ Doppler on the left side.  Pulmonary:     Effort: Pulmonary effort is normal. No tachypnea, bradypnea or respiratory distress.     Breath sounds: Normal breath sounds. No stridor.  Abdominal:     General: Bowel sounds are normal. There is no distension. There are no signs of injury.     Palpations: Abdomen is soft. There is no mass or pulsatile mass.     Tenderness: There is no abdominal tenderness. There is no guarding or rebound.     Hernia: There is no hernia in the umbilical area or ventral area.  Feet:     Right foot:     Skin integrity: Erythema, callus and dry skin present. No ulcer, blister, skin breakdown, warmth or fissure.     Toenail Condition: Right toenails are abnormally thick.     Left foot:     Skin integrity: Erythema, callus and dry skin present. No ulcer, blister, skin breakdown, warmth or fissure.     Toenail Condition: Left toenails are abnormally thick.  Skin:    General: Skin is warm and dry.  Neurological:     General: No focal deficit present.     Mental Status: She is alert.  Psychiatric:        Behavior: Behavior is cooperative.    ED Results / Procedures / Treatments   Labs (all labs ordered are listed, but only abnormal results are displayed) Labs Reviewed  CBC WITH DIFFERENTIAL/PLATELET - Abnormal; Notable  for the following components:      Result Value   RBC 5.37 (*)    All other components within normal limits  COMPREHENSIVE METABOLIC PANEL - Abnormal; Notable for the following components:   BUN 7 (*)    AST 63 (*)    All other components within normal limits  URINALYSIS, ROUTINE W REFLEX MICROSCOPIC - Abnormal; Notable for the following components:   Color, Urine AMBER (*)    APPearance TURBID (*)    Ketones, ur 20 (*)    Leukocytes,Ua SMALL (*)    All  other components within normal limits  RESP PANEL BY RT-PCR (FLU A&B, COVID) ARPGX2  C DIFFICILE QUICK SCREEN W PCR REFLEX    GASTROINTESTINAL PANEL BY PCR, STOOL (REPLACES STOOL CULTURE)  LIPASE, BLOOD  URINALYSIS, ROUTINE W REFLEX MICROSCOPIC    EKG EKG Interpretation  Date/Time:  Friday December 13 2020 15:23:03 EST Ventricular Rate:  74 PR Interval:  152 QRS Duration: 78 QT Interval:  370 QTC Calculation: 410 R Axis:   14 Text Interpretation: Sinus rhythm with Premature atrial complexes with Abberant conduction Cannot rule out Anterior infarct , age undetermined Abnormal ECG Confirmed by Thayer Jew [phone]) on 12/14/2020 5:23:35 AM  Radiology No results found.  Procedures Procedures   Medications Ordered in ED Medications - No data to display  ED Course  I have reviewed the triage vital signs and the nursing notes.  Pertinent labs & imaging results that were available during my care of the patient were reviewed by me and considered in my medical decision making (see chart for details).    MDM Rules/Calculators/A&P                           Alert 65 year old female no acute stress, nontoxic-appearing.  Presents to ED with chief complaint of diarrhea and decreased appetite.  Diarrhea has been present over the last week.  Decreased appetite over the last 4 days.  Per chart review patient completed 10-day course of Keflex for cellulitis to bilateral feet.  Patient denies any fever, chills, abdominal pain,  blood in stool, melena.  Patient endorses 2 bowel movements daily.  Abdomen soft, nondistended, nontender, no guarding or rebound tenderness.  CBC shows no leukocytosis.  With no white count infrequent amount of stools lower suspicion for C. difficile at this time.  GI panel as well as C. difficile stool testing ordered.  Patient does not feel she can give a stool sample at this time.  Due to patient's decreased appetite we will obtain respiratory panel and urinalysis.  BMP shows no electrolyte abnormalities.  Patient has dry skin, calluses, and erythema to bilateral feet.  Patient states feet are improved after taking antibiotics.  Patient has no warmth.  Pulses found via Doppler.  With symptoms being bilateral suspect possible chronic venous stasis changes.  We will have patient follow-up with PCP.  Respiratory panel negative for COVID-19 and influenza.  Urinalysis shows no signs of infection.  Patient did not have any bowel movements during her time in the emergency department.  Unable to collect stool sample.  Low suspicion for C. difficile at this time.  Will discharge patient to follow-up with primary care provider.  Patient care discussed with attending physician Dr. Dina Rich.  Final Clinical Impression(s) / ED Diagnoses Final diagnoses:  Diarrhea, unspecified type  Decreased appetite    Rx / DC Orders ED Discharge Orders     None        Loni Beckwith, PA-C 12/14/20 1423    Dina Rich Barbette Hair, MD 12/16/20 [phone]

## 2020-12-14 NOTE — Discharge Instructions (Addendum)
You came to the emergency department today to be evaluated for your diarrhea and decreased appetite.  Your physical exam was reassuring.  Your lab work showed no large abnormalities.  You were negative for COVID-19 and influenza.  Please follow-up closely with your primary care provider.  Get help right away if: You have chest pain. You feel extremely weak or you faint. You have bloody or black stools or stools that look like tar. You have severe pain, cramping, or bloating in your abdomen. You have trouble breathing or you are breathing very quickly. Your heart is beating very quickly. Your skin feels cold and clammy. You feel confused. You have signs of dehydration, such as: Dark urine, very little urine, or no urine. Cracked lips. Dry mouth. Sunken eyes. Sleepiness. Weakness.

## 2020-12-14 NOTE — ED Notes (Signed)
Assumed care of pt at this time. Pt alert and oriented x 4 watching television. NAD noted, no needs at this time. Will continue to monitor.

## 2021-02-05 ENCOUNTER — Encounter (HOSPITAL_BASED_OUTPATIENT_CLINIC_OR_DEPARTMENT_OTHER): Payer: Medicaid Other | Attending: Internal Medicine | Admitting: Internal Medicine

## 2023-12-14 ENCOUNTER — Encounter
